# Patient Record
Sex: Male | Born: 1955
Health system: Southern US, Community
[De-identification: ages and names within clinical notes are randomized; demographics above are authoritative.]

## PROBLEM LIST (undated history)

## (undated) DIAGNOSIS — C4490 Unspecified malignant neoplasm of skin, unspecified: Secondary | ICD-10-CM

## (undated) DIAGNOSIS — G5601 Carpal tunnel syndrome, right upper limb: Secondary | ICD-10-CM

## (undated) DIAGNOSIS — R269 Unspecified abnormalities of gait and mobility: Secondary | ICD-10-CM

## (undated) DIAGNOSIS — G20 Parkinson's disease: Secondary | ICD-10-CM

## (undated) DIAGNOSIS — M7060 Trochanteric bursitis, unspecified hip: Secondary | ICD-10-CM

## (undated) DIAGNOSIS — M21371 Foot drop, right foot: Secondary | ICD-10-CM

## (undated) DIAGNOSIS — M7712 Lateral epicondylitis, left elbow: Secondary | ICD-10-CM

## (undated) DIAGNOSIS — G35 Multiple sclerosis: Secondary | ICD-10-CM

## (undated) DIAGNOSIS — G7102 Facioscapulohumeral muscular dystrophy: Secondary | ICD-10-CM

## (undated) DIAGNOSIS — IMO0002 Reserved for concepts with insufficient information to code with codable children: Secondary | ICD-10-CM

## (undated) DIAGNOSIS — K649 Unspecified hemorrhoids: Secondary | ICD-10-CM

## (undated) DIAGNOSIS — K219 Gastro-esophageal reflux disease without esophagitis: Secondary | ICD-10-CM

## (undated) DIAGNOSIS — M21372 Foot drop, left foot: Secondary | ICD-10-CM

## (undated) HISTORY — DX: Foot drop, left foot: M21.372

## (undated) HISTORY — DX: Carpal tunnel syndrome, right upper limb: G56.01

## (undated) HISTORY — DX: Trochanteric bursitis, unspecified hip: M70.60

## (undated) HISTORY — DX: Unspecified hemorrhoids: K64.9

## (undated) HISTORY — DX: Lateral epicondylitis, left elbow: M77.12

## (undated) HISTORY — DX: Foot drop, right foot: M21.371

## (undated) HISTORY — DX: Gastro-esophageal reflux disease without esophagitis: K21.9

## (undated) HISTORY — DX: Reserved for concepts with insufficient information to code with codable children: IMO0002

## (undated) HISTORY — DX: Parkinson's disease: G20

## (undated) HISTORY — PX: HEMORRHOIDECTOMY WITH HEMORRHOID BANDING: SHX5633

## (undated) HISTORY — DX: Unspecified malignant neoplasm of skin, unspecified: C44.90

## (undated) HISTORY — DX: Multiple sclerosis: G35

## (undated) HISTORY — DX: Unspecified abnormalities of gait and mobility: R26.9

## (undated) HISTORY — DX: Facioscapulohumeral muscular dystrophy: G71.02

---

## 1999-03-19 ENCOUNTER — Encounter: Admission: RE | Admit: 1999-03-19 | Discharge: 1999-06-17 | Payer: Self-pay | Admitting: Neurology

## 2002-08-06 ENCOUNTER — Emergency Department (HOSPITAL_COMMUNITY): Admission: EM | Admit: 2002-08-06 | Discharge: 2002-08-06 | Payer: Self-pay | Admitting: Emergency Medicine

## 2002-08-06 ENCOUNTER — Encounter: Payer: Self-pay | Admitting: Emergency Medicine

## 2002-09-03 ENCOUNTER — Ambulatory Visit (HOSPITAL_BASED_OUTPATIENT_CLINIC_OR_DEPARTMENT_OTHER): Admission: RE | Admit: 2002-09-03 | Discharge: 2002-09-03 | Payer: Self-pay | Admitting: Orthopedic Surgery

## 2005-08-14 ENCOUNTER — Ambulatory Visit (HOSPITAL_COMMUNITY): Admission: RE | Admit: 2005-08-14 | Discharge: 2005-08-14 | Payer: Self-pay | Admitting: Gastroenterology

## 2005-09-02 HISTORY — PX: CHOLECYSTECTOMY: SHX55

## 2005-11-28 ENCOUNTER — Encounter: Admission: RE | Admit: 2005-11-28 | Discharge: 2005-11-28 | Payer: Self-pay | Admitting: Internal Medicine

## 2006-01-17 ENCOUNTER — Ambulatory Visit (HOSPITAL_COMMUNITY): Admission: RE | Admit: 2006-01-17 | Discharge: 2006-01-18 | Payer: Self-pay | Admitting: Surgery

## 2006-01-17 ENCOUNTER — Encounter (INDEPENDENT_AMBULATORY_CARE_PROVIDER_SITE_OTHER): Payer: Self-pay | Admitting: *Deleted

## 2006-02-05 ENCOUNTER — Ambulatory Visit (HOSPITAL_COMMUNITY): Admission: RE | Admit: 2006-02-05 | Discharge: 2006-02-05 | Payer: Self-pay | Admitting: General Surgery

## 2007-07-02 ENCOUNTER — Encounter: Admission: RE | Admit: 2007-07-02 | Discharge: 2007-07-02 | Payer: Self-pay | Admitting: Internal Medicine

## 2011-01-18 NOTE — Op Note (Signed)
NAME:  Luis [name], Luis [name] NO.:  [id number]   MEDICAL RECORD NO.:  [id number]                   PATIENT TYPE:  AMB   LOCATION:  DSC                                  FACILITY:  [facility]   PHYSICIAN:  Thera Flake., M.D.             DATE OF BIRTH:  1956/07/10   DATE OF PROCEDURE:  09/03/2002  DATE OF DISCHARGE:                                 OPERATIVE REPORT   PREOPERATIVE DIAGNOSES:  1. Torn posterior cruciate ligament, right knee.  2. Torn right medial meniscus.   POSTOPERATIVE DIAGNOSES:  1. Torn anterior cruciate ligament, right knee.  2. Torn medial meniscus.  3. Plica.  4. Chondromalacia of the patellofemoral joint.   PROCEDURES:  1. Partial medial meniscectomy.  2. Debridement chondroplasty.  3. Excision, plica.  4. Debridement, anterior cruciate ligament.   SURGEON:  Dyke Brackett, M.D.   ANESTHESIA:  MAC.   INDICATIONS:  A 55 year old with a knee injury, MRI showing torn posterior  cruciate ligament, medial meniscus, thought to be amenable to outpatient  arthroscopy.   DESCRIPTION OF PROCEDURE:  Arthroscope at superomedial, inferolateral, and  inferomedial portal.  The thick, fibrotic plica was excised, a lot of  hemosiderin staining consistent with previous hemarthrosis.  Resection of  some of the fat pad that was fibrotic.  Medial compartment looked normal.  Lateral compartment looked normal in terms of articular cartilage.  The  lateral meniscus was normal.  The medial meniscus showed a complex tear of  the posterior horn of the medial meniscus requiring resection of about 30-  40% of the meniscus substance.  The patellofemoral joint showed mild  chondromalacia, which was debrided.   MRI, interestingly, had shown a torn posterior cruciate ligament.  It was  clear that on intra-articular examination, the anterior cruciate ligament  was torn off the femur.  The posterior cruciate by and large appeared intact  although it possibly  could have been injured as well, but there was  definitely an obvious ACL injury.  There was no impingement of the stump as  it had healed back to the PCL.  Debridement of this area was carried out  as well, patellofemoral joint separate from the medial compartment, medial  meniscectomy.  Knee drained free of fluid.  The  portals closed with nylon.  The knee infiltrated with Marcaine, morphine,  and an additional 40 mg of Depo-Medrol with additional 10 cc of Marcaine  with epinephrine for a total of 30 cc 0.5% Marcaine into the joint and into  the portals.  A lightly compressive sterile dressing applied.                                               Thera Flake., M.D.  WDC/MEDQ  D:  09/03/2002  T:  09/03/2002  Job:  [id number]

## 2011-01-18 NOTE — Op Note (Signed)
NAME:  Luis [name], Luis [name] NO.:  [id number]   MEDICAL RECORD NO.:  [id number]          PATIENT TYPE:  AMB   LOCATION:  ENDO                         FACILITY:  [facility]   PHYSICIAN:  Danise Edge, M.D.   DATE OF BIRTH:  [birth year]   DATE OF PROCEDURE:  08/14/2005  DATE OF DISCHARGE:                                 OPERATIVE REPORT   PROCEDURE:  Screening colonoscopy.   INDICATIONS FOR PROCEDURE:  Luis [name] is a 55 year old male here to  undergo his first screening colonoscopy with polypectomy to prevent colon  cancer.   ENDOSCOPIST:  Danise Edge, M.D.   PREMEDICATION:  Versed 5 mg, Demerol 50 mg.   PROCEDURE:  After obtaining informed consent, Luis [name] was placed in the  left lateral decubitus position.  I administered intravenous Demerol and  intravenous Versed to achieve conscious sedation for the procedure.  The  patient's blood pressure, oxygen saturation, and cardiac rhythm were  monitored throughout the procedure and documented in the medical record.   Anal inspection was normal.  Digital rectal exam reveals a nonnodular  prostate.  The Olympus adjustable pediatric colonoscope was introduced into  the rectum and advanced to the cecum.  A normal-appearing appendiceal  orifice and ileocecal valve were identified.  Colonic preparation for the  exam today was excellent.   RECTUM:  Normal.  Retroflexed view of the distal rectum normal.   SIGMOID COLON/DESCENDING COLON:  Normal.   SPLENIC FLEXURE:  Normal.   TRANSVERSE COLON:  Normal.   HEPATIC FLEXURE:  Normal.   ASCENDING COLON:  Normal.   CECUM AND ILEOCECAL VALVE:  Normal.   ASSESSMENT:  Normal screening proctocolonoscopy of the cecum.  No endoscopic  evidence for the presence of colorectal neoplasia.           ______________________________  Danise Edge, M.D.     MJ/MEDQ  D:  08/14/2005  T:  08/14/2005  Job:  [id number]   cc:   Candyce Churn, M.D.  Fax: [phone]

## 2011-01-18 NOTE — Op Note (Signed)
NAME:  Luis [name], Luis [name] NO.:  [id number]   MEDICAL RECORD NO.:  [id number]          PATIENT TYPE:  OIB   LOCATION:  5703                         FACILITY:  [facility]   PHYSICIAN:  Velora Heckler, MD      DATE OF BIRTH:  [birth year]   DATE OF PROCEDURE:  01/17/2006  DATE OF DISCHARGE:                                 OPERATIVE REPORT   PREOPERATIVE DIAGNOSIS:  Symptomatic cholelithiasis   POSTOPERATIVE DIAGNOSIS:  Symptomatic cholelithiasis   PROCEDURE:  Laparoscopic cholecystectomy with intraoperative  cholangiography.   SURGEON:  Velora Heckler, MD, FACS   ASSISTANT:  Gabrielle Dare. Janee Morn, M.D.   ANESTHESIA:  General per Germaine Pomfret, M.D.   ESTIMATED BLOOD LOSS:  Minimal.   PREPARATION:  Betadine.   COMPLICATIONS:  None.   INDICATIONS:  The patient is a 55 year old white male from [place], [place] who presents at the request of Dr. Johnella Moloney for right upper  quadrant abdominal pain and known cholelithiasis.  The patient had initial  episode of biliary colic in March 2007.  The patient was evaluated including  right upper quadrant ultrasound which demonstrated a thickened gallbladder  wall containing multiple gallstones.  The patient is now prepared and  brought to the operating room for cholecystectomy.   BODY OF REPORT.:  Procedure is done in OR #17 at the [facility].  The patient is brought to the operating room, placed in the supine position  on the operating room table.  Following administration of general  anesthesia, the patient is prepped and draped in usual strict aseptic  fashion.  After ascertaining that an adequate level of anesthesia had been  obtained, an infraumbilical incision is made with a #15 blade.  Dissection  was carried down to the fascia.  Fascia is incised in the midline.  The  peritoneal cavity is entered cautiously.  Then #0 Vicryl pursestring suture  is placed in the fascia.   A Hasson cannula was  introduced and secured with a pursestring suture.  The  abdomen was insufflated carbon dioxide.  Laparoscope was introduced and the  abdomen explored.  Operative ports were placed along the right costal margin  in the midline, midclavicular line, and anterior axillary line.  Fundus of  the gallbladder was grasped and retracted cephalad.  Dissection is begun at  the neck of the gallbladder.  Peritoneum is incised.  Cystic duct is  dissected out.  It appears quite small.  Clip was placed at the neck of the  gallbladder.  Cystic duct is incised.  Clear yellow bile emanates from the  cystic duct.   A Cook cholangiography catheter is introduced through a stab wound in the  right upper quadrant and inserted into the cystic duct.  It is secured with  a Ligaclip.  Using C-arm fluoroscopy, real time cholangiography is  performed.  There is rapid filling of a normal caliber common bile duct.  There is free flow distally into the duodenum.  There is reflux of contrast  proximally into the right-and-left hepatic ductal systems.  There  are no  filling defects.  There is no evidence of obstruction.  Clip is withdrawn  and Cook catheter is removed from the peritoneal cavity.  Cystic duct is  triply clipped and divided.  Cystic artery is dissected out, doubly clipped,  and divided.   Gallbladder is then excised the gallbladder bed using the hook  electrocautery for hemostasis.  Gallbladder is completely extracted and  placed into an EndoCatch bag.  It is withdrawn through the umbilical port  without difficulty.  Right upper quadrant is irrigated copiously with warm  saline which was evacuated.  Good hemostasis is noted.  Ports are removed  under direct vision; and good hemostasis is noted at all port sites.  Pneumoperitoneum is released.  Port sites were anesthetized with local  anesthetic.  Then #0 Vicryl pursestring suture is tied securely at the  umbilicus.  Wounds were closed with interrupted 4-0  Vicryl subcuticular  sutures.  Wounds were washed and dried; and Benzoin and Steri-Strips are  applied.  Sterile dressings are applied.  The patient is awakened from  anesthesia and brought to the recovery room in stable condition.  The  patient tolerated the procedure well.      Velora Heckler, MD  Electronically Signed     TMG/MEDQ  D:  01/17/2006  T:  01/17/2006  Job:  [id number]   cc:   Candyce Churn, M.D.  Fax: [phone]

## 2012-11-09 ENCOUNTER — Other Ambulatory Visit: Payer: Self-pay | Admitting: Internal Medicine

## 2012-11-09 DIAGNOSIS — M545 Low back pain, unspecified: Secondary | ICD-10-CM

## 2012-11-11 ENCOUNTER — Other Ambulatory Visit: Payer: Self-pay | Admitting: Internal Medicine

## 2012-11-11 DIAGNOSIS — Z139 Encounter for screening, unspecified: Secondary | ICD-10-CM

## 2012-11-12 ENCOUNTER — Ambulatory Visit
Admission: RE | Admit: 2012-11-12 | Discharge: 2012-11-12 | Disposition: A | Discharge status: Home / Self Care | Payer: BC Managed Care – PPO | Source: Ambulatory Visit | Attending: Internal Medicine | Admitting: Internal Medicine

## 2012-11-12 DIAGNOSIS — Z139 Encounter for screening, unspecified: Secondary | ICD-10-CM

## 2012-11-15 ENCOUNTER — Ambulatory Visit
Admission: RE | Admit: 2012-11-15 | Discharge: 2012-11-15 | Disposition: A | Discharge status: Home / Self Care | Payer: BC Managed Care – PPO | Source: Ambulatory Visit | Attending: Internal Medicine | Admitting: Internal Medicine

## 2012-11-15 DIAGNOSIS — M545 Low back pain, unspecified: Secondary | ICD-10-CM

## 2013-06-16 ENCOUNTER — Other Ambulatory Visit: Payer: Self-pay | Admitting: Gastroenterology

## 2014-03-09 ENCOUNTER — Other Ambulatory Visit: Payer: Self-pay | Admitting: Internal Medicine

## 2014-03-09 ENCOUNTER — Ambulatory Visit
Admission: RE | Admit: 2014-03-09 | Discharge: 2014-03-09 | Disposition: A | Discharge status: Home / Self Care | Payer: No Typology Code available for payment source | Source: Ambulatory Visit | Attending: Internal Medicine | Admitting: Internal Medicine

## 2014-03-09 DIAGNOSIS — R05 Cough: Secondary | ICD-10-CM

## 2014-03-09 DIAGNOSIS — R059 Cough, unspecified: Secondary | ICD-10-CM

## 2014-03-16 ENCOUNTER — Encounter: Payer: Self-pay | Admitting: Internal Medicine

## 2014-03-16 ENCOUNTER — Ambulatory Visit (INDEPENDENT_AMBULATORY_CARE_PROVIDER_SITE_OTHER): Payer: No Typology Code available for payment source | Admitting: Internal Medicine

## 2014-03-16 VITALS — BP 120/88 | HR 66 | Temp 98.3°F | Ht 71.0 in | Wt 210.0 lb

## 2014-03-16 DIAGNOSIS — R05 Cough: Secondary | ICD-10-CM

## 2014-03-16 DIAGNOSIS — R058 Other specified cough: Secondary | ICD-10-CM

## 2014-03-16 DIAGNOSIS — R059 Cough, unspecified: Secondary | ICD-10-CM

## 2014-03-16 DIAGNOSIS — R9389 Abnormal findings on diagnostic imaging of other specified body structures: Secondary | ICD-10-CM

## 2014-03-16 DIAGNOSIS — J986 Disorders of diaphragm: Secondary | ICD-10-CM

## 2014-03-16 MED ORDER — PANTOPRAZOLE SODIUM 40 MG PO TBEC: 40.0000 mg | DELAYED_RELEASE_TABLET | Freq: Every day | ORAL | Status: DC

## 2014-03-16 MED ORDER — FAMOTIDINE 20 MG PO TABS: ORAL_TABLET | ORAL | Status: DC

## 2014-03-16 NOTE — Patient Instructions (Addendum)
Pantoprazole (protonix) 40 mg   Take 30-60 min before first meal of the day and Pepcid 20 mg one bedtime until return to office - this is the best way to tell whether stomach acid is contributing to your problem.    GERD (REFLUX)  is an extremely common cause of respiratory symptoms, many times with no significant heartburn at all.    It can be treated with medication, but also with lifestyle changes including avoidance of late meals, excessive alcohol, smoking cessation, and avoid fatty foods, chocolate, peppermint, colas, red wine, and acidic juices such as orange juice.  NO MINT OR MENTHOL PRODUCTS SO NO COUGH DROPS  USE SUGARLESS CANDY INSTEAD (jolley ranchers or Stover's)  NO OIL BASED VITAMINS - use powdered substitutes.    Please see patient coordinator before you leave today  to schedule sniff test - I will call you with results

## 2014-03-16 NOTE — Progress Notes (Signed)
   Subjective:    Patient ID: Luis [name], male    DOB: [birth year] MRN: [id number]  HPI  62 yowm never smoker never resp problems until   in 1990s with freq resp problems " always starting in sinuses" freq complicated by bad coughing lots of mucinex and allegra alkaseltzer and pred paks and narcotic cough syrups then changed pattern as of  June 27th 2015 acutely  felt sore throat (new) but assos with  sinus congestion green drainage rx phoned in augmentin and mucinex no better moved into chest then cough med and pred and referred to pulmonary 03/16/14 by Dr Jodelle Green with abn cxr = L HD elevated    03/16/2014 1st [facility] office visit/ Wilkins Elpers  Chief Complaint  Patient presents with  . Pulmonary Consult    Referred per Dr. [name] for eval of abnormal cxr and cough. Pt c/o cough for the past 3 wks- prod with moderate clear sputum.    still has intermittent watery drainage but no correlation to dry cough day > night (robitussin)  No obvious other patterns in day to day or daytime variabilty or assoc sob or cp or chest tightness, subjective wheeze overt   hb symptoms. No unusual exp hx or h/o childhood pna/ asthma or knowledge of premature birth.  Sleeping ok without nocturnal  or early am exacerbation  of respiratory  c/o's or need for noct saba. Also denies any obvious fluctuation of symptoms with weather or environmental changes or other aggravating or alleviating factors except as outlined above   Current Medications, Allergies, Complete Past Medical History, Past Surgical History, Family History, and Social History were reviewed in [employer] record.              Review of Systems  Constitutional: Negative for fever, chills, activity change, appetite change and unexpected weight change.  HENT: Positive for congestion and sore throat. Negative for dental problem, postnasal drip, rhinorrhea, sneezing, trouble swallowing and voice change.   Eyes:  Negative for visual disturbance.  Respiratory: Positive for cough. Negative for choking and shortness of breath.   Cardiovascular: Negative for chest pain and leg swelling.  Gastrointestinal: Negative for nausea, vomiting and abdominal pain.  Genitourinary: Negative for difficulty urinating.  Musculoskeletal: Positive for arthralgias.  Skin: Negative for rash.  Psychiatric/Behavioral: Negative for behavioral problems and confusion.       Objective:   Physical Exam   Wt Readings from Last 3 Encounters:  03/16/14 210 lb (95.255 kg)      HEENT: nl dentition, turbinates, and orophanx. Nl external ear canals without cough reflex   NECK :  without JVD/Nodes/TM/ nl carotid upstrokes bilaterally   LUNGS: no acc muscle use, clear to A and P bilaterally without cough on insp or exp maneuvers   CV:  RRR  no s3 or murmur or increase in P2, no edema   ABD:  soft and nontender with nl excursion in the supine position. No bruits or organomegaly, bowel sounds nl  MS:  warm without deformities, calf tenderness, cyanosis or clubbing  SKIN: warm and dry without lesions    NEURO:  alert, approp, no deficits    cxr 03/09/14  Cardiac silhouette is normal in size. Normal mediastinal and hilar  contours. Elevated left hemidiaphragm, which is more elevated than  on the prior study. Huge stomach bubbles       Assessment & Plan:

## 2014-03-17 ENCOUNTER — Ambulatory Visit (HOSPITAL_COMMUNITY)
Admission: RE | Admit: 2014-03-17 | Discharge: 2014-03-17 | Disposition: A | Discharge status: Home / Self Care | Payer: No Typology Code available for payment source | Source: Ambulatory Visit | Attending: Internal Medicine | Admitting: Internal Medicine

## 2014-03-17 ENCOUNTER — Other Ambulatory Visit: Payer: Self-pay | Admitting: Internal Medicine

## 2014-03-17 ENCOUNTER — Encounter: Payer: Self-pay | Admitting: Internal Medicine

## 2014-03-17 DIAGNOSIS — J986 Disorders of diaphragm: Secondary | ICD-10-CM

## 2014-03-17 DIAGNOSIS — G7109 Other specified muscular dystrophies: Secondary | ICD-10-CM | POA: Insufficient documentation

## 2014-03-17 DIAGNOSIS — R9389 Abnormal findings on diagnostic imaging of other specified body structures: Secondary | ICD-10-CM

## 2014-03-17 NOTE — Progress Notes (Signed)
Quick Note:  Spoke with pt and notified of results per Dr. Melvyn Novas. Pt verbalized understanding and denied any questions. Order was sent to [facility] and confirmed with Pamala Hurry at [facility] will be done (NIF added to order that was sent to [facility])   ______

## 2014-03-18 ENCOUNTER — Ambulatory Visit (HOSPITAL_COMMUNITY): Payer: No Typology Code available for payment source

## 2014-03-18 DIAGNOSIS — R05 Cough: Secondary | ICD-10-CM | POA: Insufficient documentation

## 2014-03-18 DIAGNOSIS — R058 Other specified cough: Secondary | ICD-10-CM | POA: Insufficient documentation

## 2014-03-18 NOTE — Assessment & Plan Note (Addendum)
?   Idiopathic (favor this )  related to unusual MS pattern but not amenable to any medical and very limited surgical options;  However, doubt it's the cause for his acute problem (cough) so can follow conservatively for now

## 2014-03-18 NOTE — Assessment & Plan Note (Signed)
Nl except for L HD  - see above

## 2014-03-18 NOTE — Assessment & Plan Note (Signed)

## 2014-03-23 ENCOUNTER — Encounter: Payer: Self-pay | Admitting: Internal Medicine

## 2014-03-28 ENCOUNTER — Encounter (HOSPITAL_COMMUNITY): Payer: No Typology Code available for payment source

## 2014-03-30 ENCOUNTER — Encounter: Payer: Self-pay | Admitting: Internal Medicine

## 2014-03-30 ENCOUNTER — Ambulatory Visit (INDEPENDENT_AMBULATORY_CARE_PROVIDER_SITE_OTHER): Payer: No Typology Code available for payment source | Admitting: Internal Medicine

## 2014-03-30 VITALS — BP 124/90 | HR 62 | Temp 97.8°F | Ht 71.0 in | Wt 201.0 lb

## 2014-03-30 DIAGNOSIS — R05 Cough: Secondary | ICD-10-CM

## 2014-03-30 DIAGNOSIS — R058 Other specified cough: Secondary | ICD-10-CM

## 2014-03-30 DIAGNOSIS — R059 Cough, unspecified: Secondary | ICD-10-CM

## 2014-03-30 DIAGNOSIS — J986 Disorders of diaphragm: Secondary | ICD-10-CM

## 2014-03-30 NOTE — Patient Instructions (Addendum)
Allegra / zyrtec/ chlortrimeton (the order is more sedating but more effective for a drippy nose)  Please see patient coordinator before you leave today  to reschedule  pfts with NIF      Treatment of pain under your diaphragm consists of avoiding foods that cause gas (especially boiled eggs/ beans and raw vegetables like spinach and salads)  and citrucel 1 heaping tsp twice daily with a large glass of water.  Pain should improve w/in 2 weeks and if not then consider further GI work up.

## 2014-03-30 NOTE — Progress Notes (Signed)
Subjective:    Patient ID: Luis [name], male    DOB: [birth year] MRN: [id number]    Brief patient profile:  60 yowm with MD never smoker never resp problems until   in 1990s with freq resp problems " always starting in sinuses" freq complicated by bad coughing lots of mucinex and allegra alkaseltzer and pred paks and narcotic cough syrups then changed pattern as of  June 27th 2015 acutely  felt sore throat (new) but assos with  sinus congestion green drainage rx phoned in augmentin and mucinex no better moved into chest then cough med and pred and referred to pulmonary 03/16/14 by Dr Jodelle Green with abn cxr = L HD elevated referred to pulmonary clinic 03/16/14    History of Present Illness  03/16/2014 1st [facility] office visit/ Nohea Kras  Chief Complaint  Patient presents with  . Pulmonary Consult    Referred per Dr. [name] for eval of abnormal cxr and cough. Pt c/o cough for the past 3 wks- prod with moderate clear sputum.    still has intermittent watery drainage but no correlation to dry cough day > night (robitussin) rec Pantoprazole (protonix) 40 mg   Take 30-60 min before first meal of the day and Pepcid 20 mg one bedtime until return to office - this is the best way to tell whether stomach acid is contributing to your problem.   GERD diet   03/30/2014 f/u ov/Ryley Bachtel re: Paralyzed L HD  Chief Complaint  Patient presents with  . Follow-up    Pt reports his cough is unchanged since last visit. No new co's today.    Not limited by breathing from desired activities and still working as Naval architect for [employer]. Doe occ have LUQ discomfort tranisient anteriorly always relieve supine, more likely in sitting position   No obvious day to day or daytime variabilty or assoc excess or purulent mucus or   chest tightness, subjective wheeze overt sinus or hb symptoms. No unusual exp hx or h/o childhood pna/ asthma or knowledge of premature birth.  Sleeping ok without nocturnal   or early am exacerbation  of respiratory  c/o's or need for noct saba. Also denies any obvious fluctuation of symptoms with weather or environmental changes or other aggravating or alleviating factors except as outlined above   Current Medications, Allergies, Complete Past Medical History, Past Surgical History, Family History, and Social History were reviewed in [employer] record.  ROS  The following are not active complaints unless bolded sore throat, dysphagia, dental problems, itching, sneezing,  nasal congestion or excess/ purulent secretions, ear ache,   fever, chills, sweats, unintended wt loss, pleuritic or exertional cp, hemoptysis,  orthopnea pnd or leg swelling, presyncope, palpitations, heartburn, abdominal pain, anorexia, nausea, vomiting, diarrhea  or change in bowel or urinary habits, change in stools or urine, dysuria,hematuria,  rash, arthralgias, visual complaints, headache, numbness weakness or ataxia or problems with walking or coordination,  change in mood/affect or memory.                         Objective:   Physical Exam  03/30/2014       201  Wt Readings from Last 3 Encounters:  03/16/14 210 lb (95.255 kg)      HEENT: nl dentition, turbinates, and orophanx. Nl external ear canals without cough reflex   NECK :  without JVD/Nodes/TM/ nl carotid upstrokes bilaterally    LUNGS: no acc muscle  use, decreased bs L base   CV:  RRR  no s3 or murmur or increase in P2, no edema   ABD:  soft and nontender with nl excursion in the supine position. No bruits or organomegaly, bowel sounds nl  MS:  warm without deformities, calf tenderness, cyanosis or clubbing  SKIN: warm and dry without lesions    NEURO:  alert, approp, no deficits    cxr 03/09/14  Cardiac silhouette is normal in size. Normal mediastinal and hilar  contours. Elevated left hemidiaphragm, which is more elevated than  on the prior study. Huge stomach bubbles         Assessment & Plan:

## 2014-03-31 ENCOUNTER — Encounter: Payer: Self-pay | Admitting: Internal Medicine

## 2014-03-31 NOTE — Assessment & Plan Note (Signed)
No response to acid suppression ok to try off and just use 1st gen H1 if tolerates as per guidelines.

## 2014-03-31 NOTE — Assessment & Plan Note (Addendum)
Baseline cxr nl 12/18/06  - Poss SNIFF for paradox on L 03/17/2014   The unilaterality and lack of sign limiting sob strongly suggests this is a chronic condition and likely not related to MD - common to see this as idiopathic event like a Bell's palsy and not severe enough to warrant any kind of correction or change in rx except control wt, ex/ IS   He does have occ pain in upright position suggesting air getting trapped in splenic flexure. Paint is  Stereotypical, recurrent with a very limited distribution of pain locations, daytime, not exacerbated by ex or coughing, worse in sitting position, associated with generalized abd bloating, not present supine due to the dome effect of the diaphragm is  canceled in that position. Frequently these patients have had multiple negative GI workups and CT scans.  Treatment consists of avoiding foods that cause gas (especially beans and raw vegetables like spinach and salads)  and citrucel 1 heaping tsp twice daily with a large glass of water.  Pain should improve w/in 2 weeks and if not then consider further GI work up.

## 2014-04-04 ENCOUNTER — Ambulatory Visit (HOSPITAL_COMMUNITY)
Admission: RE | Admit: 2014-04-04 | Discharge: 2014-04-04 | Disposition: A | Discharge status: Home / Self Care | Payer: No Typology Code available for payment source | Source: Ambulatory Visit | Attending: Internal Medicine | Admitting: Internal Medicine

## 2014-04-04 DIAGNOSIS — J986 Disorders of diaphragm: Secondary | ICD-10-CM | POA: Insufficient documentation

## 2014-04-04 MED ORDER — ALBUTEROL SULFATE (2.5 MG/3ML) 0.083% IN NEBU
2.5000 mg | INHALATION_SOLUTION | Freq: Once | RESPIRATORY_TRACT | Status: AC
  Administered 2014-04-04: 2.5 mg via RESPIRATORY_TRACT

## 2014-04-07 ENCOUNTER — Encounter: Payer: Self-pay | Admitting: Internal Medicine

## 2014-04-07 LAB — PULMONARY FUNCTION TEST
DL/VA % pred: 122 %
DL/VA: 5.73 ml/min/mmHg/L
DLCO unc % pred: 83 %
DLCO unc: 28.05 ml/min/mmHg
FEF 25-75 Post: 3.07 L/sec
FEF 25-75 Pre: 2.85 L/sec
FEF2575-%Change-Post: 7 %
FEF2575-%Pred-Post: 98 %
FEF2575-%Pred-Pre: 91 %
FEV1-%Change-Post: 1 %
FEV1-%Pred-Post: 73 %
FEV1-%Pred-Pre: 72 %
FEV1-Post: 2.77 L
FEV1-Pre: 2.74 L
FEV1FVC-%Change-Post: 3 %
FEV1FVC-%Pred-Pre: 107 %
FEV6-%Change-Post: -2 %
FEV6-%Pred-Post: 69 %
FEV6-%Pred-Pre: 70 %
FEV6-Post: 3.29 L
FEV6-Pre: 3.36 L
FEV6FVC-%Pred-Post: 104 %
FEV6FVC-%Pred-Pre: 104 %
FVC-%Change-Post: -2 %
FVC-%Pred-Post: 65 %
FVC-%Pred-Pre: 67 %
FVC-Post: 3.29 L
FVC-Pre: 3.36 L
Post FEV1/FVC ratio: 84 %
Post FEV6/FVC ratio: 100 %
Pre FEV1/FVC ratio: 82 %
Pre FEV6/FVC Ratio: 100 %
RV % pred: 317 %
RV: 7.2 L
TLC % pred: 144 %
TLC: 10.39 L

## 2014-04-11 ENCOUNTER — Encounter (HOSPITAL_COMMUNITY): Payer: No Typology Code available for payment source

## 2014-06-02 ENCOUNTER — Telehealth: Payer: Self-pay | Admitting: Internal Medicine

## 2014-06-02 NOTE — Telephone Encounter (Signed)
Called and spoke to pt's wife. Pt questioning if he can continue taking protonix. Pt stated it has helped.   MW please advise if you want to continue Protonix.     OV on 03/16/14: Pantoprazole (protonix) 40 mg Take 30-60 min before first meal of the day and Pepcid 20 mg one bedtime until return to office   OV from 03/30/14:  Patient Instructions     Allegra / zyrtec/ chlortrimeton (the order is more sedating but more effective for a drippy nose)  Please see patient coordinator before you leave today to reschedule pfts with NIF  Treatment of pain under your diaphragm consists of avoiding foods that cause gas (especially boiled eggs/ beans and raw vegetables like spinach and salads) and citrucel 1 heaping tsp twice daily with a large glass of water. Pain should improve w/in 2 weeks and if not then consider further GI work up.

## 2014-06-02 NOTE — Telephone Encounter (Signed)
LMTCB x 1 

## 2014-06-02 NOTE — Telephone Encounter (Signed)
Yes ok for 3 m then will need to discuss longterm rx with his primary or see GI as I don't usually rec it be continued indefinitely s monitoring need

## 2014-06-03 MED ORDER — PANTOPRAZOLE SODIUM 40 MG PO TBEC: 40.0000 mg | DELAYED_RELEASE_TABLET | Freq: Every day | ORAL | Status: DC

## 2014-06-03 NOTE — Telephone Encounter (Signed)
Called and spoke to pt's wife. Informed pt of recs per MW. Rx sent to preferred pharm with hold till 10/11 per pt's wife. Nothing further needed.

## 2014-06-30 ENCOUNTER — Ambulatory Visit
Admission: RE | Admit: 2014-06-30 | Discharge: 2014-06-30 | Disposition: A | Discharge status: Home / Self Care | Payer: No Typology Code available for payment source | Source: Ambulatory Visit | Attending: Internal Medicine | Admitting: Internal Medicine

## 2014-06-30 ENCOUNTER — Other Ambulatory Visit: Payer: Self-pay | Admitting: Internal Medicine

## 2014-06-30 DIAGNOSIS — J986 Disorders of diaphragm: Secondary | ICD-10-CM

## 2015-06-19 ENCOUNTER — Other Ambulatory Visit: Payer: Self-pay | Admitting: Internal Medicine

## 2015-06-19 ENCOUNTER — Ambulatory Visit
Admission: RE | Admit: 2015-06-19 | Discharge: 2015-06-19 | Disposition: A | Discharge status: Home / Self Care | Payer: No Typology Code available for payment source | Source: Ambulatory Visit | Attending: Internal Medicine | Admitting: Internal Medicine

## 2015-06-19 DIAGNOSIS — R0602 Shortness of breath: Secondary | ICD-10-CM

## 2016-04-02 ENCOUNTER — Ambulatory Visit
Admission: RE | Admit: 2016-04-02 | Discharge: 2016-04-02 | Disposition: A | Discharge status: Home / Self Care | Payer: BLUE CROSS/BLUE SHIELD | Source: Ambulatory Visit | Attending: Internal Medicine | Admitting: Internal Medicine

## 2016-04-02 ENCOUNTER — Other Ambulatory Visit: Payer: Self-pay | Admitting: Internal Medicine

## 2016-04-02 DIAGNOSIS — R0789 Other chest pain: Secondary | ICD-10-CM

## 2016-07-31 ENCOUNTER — Ambulatory Visit
Admission: RE | Admit: 2016-07-31 | Discharge: 2016-07-31 | Disposition: A | Discharge status: Home / Self Care | Payer: BLUE CROSS/BLUE SHIELD | Source: Ambulatory Visit | Attending: Internal Medicine | Admitting: Internal Medicine

## 2016-07-31 ENCOUNTER — Other Ambulatory Visit: Payer: Self-pay | Admitting: Internal Medicine

## 2016-07-31 DIAGNOSIS — R1031 Right lower quadrant pain: Secondary | ICD-10-CM

## 2016-12-16 ENCOUNTER — Other Ambulatory Visit: Payer: Self-pay | Admitting: Dermatology

## 2017-02-18 ENCOUNTER — Other Ambulatory Visit (HOSPITAL_COMMUNITY): Payer: Self-pay | Admitting: Internal Medicine

## 2017-02-18 ENCOUNTER — Ambulatory Visit
Admission: RE | Admit: 2017-02-18 | Discharge: 2017-02-18 | Disposition: A | Discharge status: Home / Self Care | Payer: BLUE CROSS/BLUE SHIELD | Source: Ambulatory Visit | Attending: Internal Medicine | Admitting: Internal Medicine

## 2017-02-18 ENCOUNTER — Other Ambulatory Visit: Payer: Self-pay | Admitting: Internal Medicine

## 2017-02-18 DIAGNOSIS — G7100 Muscular dystrophy, unspecified: Secondary | ICD-10-CM

## 2017-02-18 DIAGNOSIS — J986 Disorders of diaphragm: Secondary | ICD-10-CM

## 2017-02-24 ENCOUNTER — Ambulatory Visit (HOSPITAL_COMMUNITY)
Admission: RE | Admit: 2017-02-24 | Discharge: 2017-02-24 | Disposition: A | Discharge status: Home / Self Care | Payer: BLUE CROSS/BLUE SHIELD | Source: Ambulatory Visit | Attending: Internal Medicine | Admitting: Internal Medicine

## 2017-02-24 DIAGNOSIS — G7100 Muscular dystrophy, unspecified: Secondary | ICD-10-CM

## 2017-02-24 DIAGNOSIS — I360 Nonrheumatic tricuspid (valve) stenosis: Secondary | ICD-10-CM | POA: Diagnosis not present

## 2017-02-26 ENCOUNTER — Telehealth (HOSPITAL_COMMUNITY): Payer: Self-pay | Admitting: Internal Medicine

## 2017-02-26 ENCOUNTER — Other Ambulatory Visit: Payer: Self-pay | Admitting: Internal Medicine

## 2017-02-26 ENCOUNTER — Telehealth: Payer: Self-pay

## 2017-02-26 DIAGNOSIS — R0789 Other chest pain: Secondary | ICD-10-CM

## 2017-02-26 DIAGNOSIS — R5383 Other fatigue: Secondary | ICD-10-CM

## 2017-02-26 NOTE — Telephone Encounter (Signed)
02/26/2017 09:56 AM Phone (Outgoing) [name], Luis Mavis (Self) [phone] (H)   Completed - Called pt and spoke with him to get scheduled for [facility] and he voiced that he would call right back he had to check on some dates.     By Verdene Rio

## 2017-02-26 NOTE — Telephone Encounter (Signed)
SENT NOTES TO SCHEDULING 

## 2017-03-03 ENCOUNTER — Other Ambulatory Visit (HOSPITAL_COMMUNITY): Payer: BLUE CROSS/BLUE SHIELD

## 2017-03-10 ENCOUNTER — Ambulatory Visit (HOSPITAL_COMMUNITY): Payer: BLUE CROSS/BLUE SHIELD

## 2017-03-25 ENCOUNTER — Encounter (HOSPITAL_COMMUNITY): Payer: BLUE CROSS/BLUE SHIELD

## 2017-04-02 ENCOUNTER — Telehealth (HOSPITAL_COMMUNITY): Payer: Self-pay | Admitting: *Deleted

## 2017-04-02 NOTE — Telephone Encounter (Signed)
Patient's wife per patients verbal permission, given detailed instructions per Myocardial Perfusion Study Information Sheet for the test on 04/07/17. Patient notified to arrive 15 minutes early and that it is imperative to arrive on time for appointment to keep from having the test rescheduled.  If you need to cancel or reschedule your appointment, please call the office within 24 hours of your appointment. . Patient verbalized understanding. Kirstie Peri

## 2017-04-02 NOTE — Telephone Encounter (Signed)
Patient' given detailed instructions per Myocardial Perfusion Study Information Sheet for the test on 04/07/17. Patient notified to arrive 15 minutes early and that it is imperative to arrive on time for appointment to keep from having the test rescheduled.  If you need to cancel or reschedule your appointment, please call the office within 24 hours of your appointment. . Patient verbalized understanding. Luis [name]

## 2017-04-07 ENCOUNTER — Encounter (INDEPENDENT_AMBULATORY_CARE_PROVIDER_SITE_OTHER): Payer: Self-pay

## 2017-04-07 ENCOUNTER — Ambulatory Visit (HOSPITAL_COMMUNITY): Payer: BLUE CROSS/BLUE SHIELD | Attending: Internal Medicine

## 2017-04-07 DIAGNOSIS — Z8249 Family history of ischemic heart disease and other diseases of the circulatory system: Secondary | ICD-10-CM | POA: Insufficient documentation

## 2017-04-07 DIAGNOSIS — R0789 Other chest pain: Secondary | ICD-10-CM

## 2017-04-07 DIAGNOSIS — I2510 Atherosclerotic heart disease of native coronary artery without angina pectoris: Secondary | ICD-10-CM | POA: Insufficient documentation

## 2017-04-07 DIAGNOSIS — R0609 Other forms of dyspnea: Secondary | ICD-10-CM | POA: Diagnosis present

## 2017-04-07 DIAGNOSIS — R5383 Other fatigue: Secondary | ICD-10-CM | POA: Diagnosis not present

## 2017-04-07 DIAGNOSIS — R9439 Abnormal result of other cardiovascular function study: Secondary | ICD-10-CM | POA: Insufficient documentation

## 2017-04-07 LAB — MYOCARDIAL PERFUSION IMAGING
LV dias vol: 105 mL (ref 62–150)
LV sys vol: 62 mL
Peak HR: 78 {beats}/min
RATE: 0.37
Rest HR: 49 {beats}/min
SDS: 1
SRS: 4
SSS: 5
TID: 1.16

## 2017-04-07 MED ORDER — TECHNETIUM TC 99M TETROFOSMIN IV KIT
31.9000 | PACK | Freq: Once | INTRAVENOUS | Status: AC | PRN
  Administered 2017-04-07: 31.9 via INTRAVENOUS
  Filled 2017-04-07: qty 32

## 2017-04-07 MED ORDER — REGADENOSON 0.4 MG/5ML IV SOLN
0.4000 mg | Freq: Once | INTRAVENOUS | Status: AC
  Administered 2017-04-07: 0.4 mg via INTRAVENOUS

## 2017-04-07 MED ORDER — TECHNETIUM TC 99M TETROFOSMIN IV KIT
11.0000 | PACK | Freq: Once | INTRAVENOUS | Status: AC | PRN
  Administered 2017-04-07: 11 via INTRAVENOUS
  Filled 2017-04-07: qty 11

## 2017-05-02 ENCOUNTER — Encounter (INDEPENDENT_AMBULATORY_CARE_PROVIDER_SITE_OTHER): Payer: Self-pay

## 2017-05-02 ENCOUNTER — Encounter: Payer: Self-pay | Admitting: Interventional Cardiology

## 2017-05-02 ENCOUNTER — Ambulatory Visit (INDEPENDENT_AMBULATORY_CARE_PROVIDER_SITE_OTHER): Payer: BLUE CROSS/BLUE SHIELD | Admitting: Interventional Cardiology

## 2017-05-02 VITALS — BP 126/84 | HR 68 | Ht 71.0 in | Wt 192.2 lb

## 2017-05-02 DIAGNOSIS — R9439 Abnormal result of other cardiovascular function study: Secondary | ICD-10-CM

## 2017-05-02 DIAGNOSIS — G7102 Facioscapulohumeral muscular dystrophy: Secondary | ICD-10-CM

## 2017-05-02 DIAGNOSIS — I420 Dilated cardiomyopathy: Secondary | ICD-10-CM | POA: Diagnosis not present

## 2017-05-02 DIAGNOSIS — G710 Muscular dystrophy: Secondary | ICD-10-CM

## 2017-05-02 NOTE — Patient Instructions (Signed)
Medication Instructions:  None  Labwork: None  Testing/Procedures: None  Follow-Up: Your physician recommends that you schedule a follow-up appointment as needed with Dr. Smith.    Any Other Special Instructions Will Be Listed Below (If Applicable).     If you need a refill on your cardiac medications before your next appointment, please call your pharmacy.   

## 2017-05-02 NOTE — Progress Notes (Signed)
Cardiology Office Note    Date:  05/02/2017   ID:  Luis [name], DOB [birth year], MRN [id number]  PCP:  [name], MD  Cardiologist: Sinclair Grooms, MD   Chief Complaint  Patient presents with  . Muscular Dystrophy     Facioscapulohumeral    History of Present Illness:  Luis [name] is a 61 y.o. male referred for cardiac consultation by Dr. Mertha Finders because of concern about the possibility of cardiomyopathy.  The patient is here because of concern about the possibility of cardiomyopathy associated with his variety of muscular dystrophy. He has Facioscapulohumeral. For some reason, there was concern earlier this year about the possibility of cardiomyopathy. An echocardiogram was done and results are noted below. He had normal LV size, function, and thickness. Because of vague complaints of exertional fatigue, dyspnea, and atypical chest pain a stress Myoview study was done with pharmacologic stress. This study was noted to be intermediate risk because of a small non-redistributing apical defect and an EF of 41%.  His peak with the patient he does have exertional fatigue and dyspnea. This is been slowly progressive over several years. I also am made aware that he has left hemidiaphragm paralysis second rib is to the dyspnea. Chest discomfort has been fleeting and is nonexertional. He does not smoke. There is no known history of premature atherosclerosis. He has never before had a heart attack. No pre-existing history of heart disease. He sleeps on his right side because of the paralyzed left diaphragm.  Past Medical History:  Diagnosis Date  . Bursitis, trochanteric    Episodic  . Carpal tunnel syndrome on right   . Degenerative disk disease    l5-S1  . GERD (gastroesophageal reflux disease)   . Hemorrhoids    with anal fissures  . Left lateral epicondylitis   . MS (multiple sclerosis) ([place])     Past Surgical History:  Procedure Laterality Date  . CHOLECYSTECTOMY   2007  . HEMORRHOIDECTOMY WITH HEMORRHOID BANDING      Current Medications: Outpatient Medications Prior to Visit  Medication Sig Dispense Refill  . Ascorbic Acid (VITAMIN C) 250 MG CHEW Chew 1 tablet by mouth as directed.    Marland Kitchen HYDROcodone-acetaminophen (NORCO) 10-325 MG per tablet Take 1 tablet by mouth every 6 (six) hours as needed.    . zolpidem (AMBIEN) 10 MG tablet Take 10 mg by mouth at bedtime as needed for sleep.    . pantoprazole (PROTONIX) 40 MG tablet Take 1 tablet (40 mg total) by mouth daily. (Patient not taking: Reported on 05/02/2017) 30 tablet 2  . amitriptyline (ELAVIL) 50 MG tablet Take 50 mg by mouth at bedtime.    . lidocaine (LIDODERM) 5 % Place 1 patch onto the skin daily. As needed    . meloxicam (MOBIC) 15 MG tablet Take 15 mg by mouth daily.    . Multiple Vitamins-Minerals (CENTRUM ADULTS PO) Take 1 tablet by mouth as directed.     No facility-administered medications prior to visit.      Allergies:   Patient has no known allergies.   Social History   Social History  . Marital status: Married    Spouse name: N/A  . Number of children: 0  . Years of education: N/A   Occupational History  . Radio broadcast assistant [employer]    Social History Main Topics  . Smoking status: Never Smoker  . Smokeless tobacco: Never Used  . Alcohol use No  . Drug  use: No  . Sexual activity: Not Asked   Other Topics Concern  . None   Social History Narrative  . None     Family History:  The patient's family history includes Allergies in his brother and sister; Bone cancer in his brother; Brain cancer in his mother; Heart disease in his father.   ROS:   Please see the history of present illness.    Anxiety concerning the possibility of having a heart problem. Upper extremity weakness. Difficulty with ambulating. Gastroesophageal reflux type symptoms. Allergic rhinitis. He has difficulty sleeping.  All other systems reviewed and are negative.   PHYSICAL EXAM:     VS:  BP 126/84 (BP Location: Left Arm)   Pulse 68   Ht 5\' 11"  (1.803 m)   Wt 192 lb 3.2 oz (87.2 kg)   BMI 26.81 kg/m    GEN: Well nourished, well developed, in no acute distress  HEENT: normal  Neck: no JVD, carotid bruits, or masses Cardiac: Cardiac exam is normal with RRR; no murmurs, rubs, or gallops,no edema  Respiratory:  clear to auscultation bilaterally, normal work of breathing GI: soft, nontender, nondistended, + BS MS: There is atrophy of the biceps shoulder girdle and other upper extremity muscles. Forearm muscles are relatively well preserved. Skin: warm and dry, no rash Neuro:  Alert and Oriented x 3, Strength and sensation are intact Psych: euthymic mood, full affect  Wt Readings from Last 3 Encounters:  05/02/17 192 lb 3.2 oz (87.2 kg)  03/30/14 201 lb (91.2 kg)  03/16/14 210 lb (95.3 kg)      Studies/Labs Reviewed:   EKG:  EKG  Reveals normal sinus rhythm with nondiagnostic Q waves 1 aVL and V6. Overall normal appearing tracing.  Recent Labs: No results found for requested labs within last 8760 hours.   Lipid Panel No results found for: CHOL, TRIG, HDL, CHOLHDL, VLDL, LDLCALC, LDLDIRECT  Additional studies/ records that were reviewed today include:  Echocardiogram 02/24/2017: Study Conclusions  - Left ventricle: The cavity size was normal. Wall thickness was   normal. Systolic function was normal. The estimated ejection   fraction was in the range of 60% to 65%. Left ventricular   diastolic function parameters were normal.  Stress myocardial perfusion study 04/07/2017: Study Highlights    Nuclear stress EF: 41%.  There was no ST segment deviation noted during stress.  There is a small defect of mild severity present in the apex location. The defect is non-reversible and consistent with apical infarct vs. diaphragmatic attenuation artifact. No ischemia noted.  This is an intermediate risk study due to reduced EF.  The left ventricular ejection  fraction is moderately decreased (30-44%).       ASSESSMENT:    1. Facioscapulohumeral muscular dystrophy ([place])   2. Abnormal nuclear stress test   3. Dilated cardiomyopathy ([place])      PLAN:  In order of problems listed above:  1. This variety of muscular dystrophy is not typically associated with hypertrophic or dilated cardiomyopathy as can occur with Duchenne's. Concern was raised after a stress test demonstrated an intermediate risk nuclear study with calculated ejection fraction of 41%. The ejection fraction is not accurate on wall motion studies. The echocardiogram is much more reliable and was totally normal in terms of function and muscle thickness, EF 60-65%. The nuclear study also raise a question concerning a fixed apical defect. This likely represents soft tissue attenuation in the setting of a dysfunctional left hemidiaphragm. I do not believe he  has cardiomyopathy related to muscular dystrophy. 2. The findings on the nuclear stress tests are likely all related a false positive study potentially contributed to by soft tissue attenuation in the setting of a dysfunctional left hemidiaphragm. No significant ischemia was noted. The EF was stated to be 41%. EF by myocardial perfusion imaging is not as accurate as 2-D Doppler echocardiography. The echo demonstrated normal LV systolic function. 3. The patient does not have a cardiomyopathy related to muscular dystrophy. I do not believe further evaluation is necessary.    Medication Adjustments/Labs and Tests Ordered: Current medicines are reviewed at length with the patient today.  Concerns regarding medicines are outlined above.  Medication changes, Labs and Tests ordered today are listed in the Patient Instructions below. Patient Instructions  Medication Instructions:  None  Labwork: None  Testing/Procedures: None  Follow-Up: Your physician recommends that you schedule a follow-up appointment as needed with Dr. Tamala Julian.     Any Other Special Instructions Will Be Listed Below (If Applicable).     If you need a refill on your cardiac medications before your next appointment, please call your pharmacy.      Signed, Sinclair Grooms, MD  05/02/2017 6:23 PM    [place], [place], [place]  [address] Phone: [phone]; Fax: [phone]

## 2017-05-06 ENCOUNTER — Other Ambulatory Visit (HOSPITAL_BASED_OUTPATIENT_CLINIC_OR_DEPARTMENT_OTHER): Payer: Self-pay

## 2017-05-06 DIAGNOSIS — G7100 Muscular dystrophy, unspecified: Secondary | ICD-10-CM

## 2017-05-06 DIAGNOSIS — R5382 Chronic fatigue, unspecified: Secondary | ICD-10-CM

## 2017-05-06 DIAGNOSIS — G4733 Obstructive sleep apnea (adult) (pediatric): Secondary | ICD-10-CM

## 2017-05-07 ENCOUNTER — Ambulatory Visit (HOSPITAL_BASED_OUTPATIENT_CLINIC_OR_DEPARTMENT_OTHER): Payer: BLUE CROSS/BLUE SHIELD | Attending: Internal Medicine | Admitting: Internal Medicine

## 2017-05-07 VITALS — Ht 71.0 in | Wt 190.0 lb

## 2017-05-07 DIAGNOSIS — G7100 Muscular dystrophy, unspecified: Secondary | ICD-10-CM

## 2017-05-07 DIAGNOSIS — G4733 Obstructive sleep apnea (adult) (pediatric): Secondary | ICD-10-CM

## 2017-05-07 DIAGNOSIS — R5382 Chronic fatigue, unspecified: Secondary | ICD-10-CM

## 2017-05-11 NOTE — Procedures (Signed)
   NAME: Luis [name] DATE OF BIRTH:  Oct 13, 1955 MEDICAL RECORD NUMBER [id number]  LOCATION: [facility]  PHYSICIAN: Marius Ditch  DATE OF STUDY: 05/07/2017  SLEEP STUDY TYPE: Positive Airway Pressure Titration               REFERRING PHYSICIAN: Marius Ditch, MD  INDICATION FOR STUDY: Moderate OSA seen on HSAT (AHI 19/hr)  EPWORTH SLEEPINESS SCORE: NA HEIGHT: 5\' 11"  (180.3 cm)  WEIGHT: 190 lb (86.2 kg)    Body mass index is 26.5 kg/m.  NECK SIZE: 15.5 in.  MEDICATIONS Patient self administered medications include: AMBIEN, STOOL SOFTENER. Medications administered during study include Sleep medicine administered - Ambien 10 mg at 10:00:00 PM. I have reviewed the patient's meds in eCW.   SLEEP STUDY TECHNIQUE The patient underwent an attended overnight polysomnography titration to assess the effects of cpap therapy. The following variables were monitored: EEG(C4-A1, C3-A2, O1-A2, O2-A1), EOG, submental and leg EMG, ECG, oxyhemoglobin saturation by pulse oximetry, thoracic and abdominal respiratory effort belts, nasal/oral airflow by pressure sensor, body position sensor and snoring sensor. CPAP pressure was titrated to eliminate apneas, hypopneas and oxygen desaturation.  TECHNICAL COMMENTS Comments added by Technician: PATIENT WAS ORDERED AS A CPAP TITRATION  Comments added by Scorer: N/A  SLEEP ARCHITECTURE The study was initiated at 10:13:14 PM and terminated at 5:11:59 AM. Total recorded time was 418.7 minutes. EEG confirmed total sleep time was 275.5 minutes yielding a sleep efficiency of 65.8%. Sleep onset after lights out was 4.7 minutes with a REM latency of 217.5 minutes. The patient spent 23.41% of the night in stage N1 sleep, 71.87% in stage N2 sleep, 0.00% in stage N3 and 4.72% in REM. The Arousal Index was 33.8/hour.  RESPIRATORY PARAMETERS The overall AHI was 2.8 per hour, and the RDI was 12.6 events/hour with a central apnea index of 0.2per  hour. The patient was titrated to a setting of  CPAP 14 cm H2O. At this setting, the sleep efficiency was 32% and the patient was supine for 74%. The AHI was 10.5 events per hour, and the RDI was 21.0 events/hour (with 0.2 central events) and the arousal index was 26.3 per hour.The oxygen nadir was 94.0% during sleep. Lower pressures seemed to work equally well. There was no development of central apnea.   LEG MOVEMENT DATA The total leg movements were with a resulting leg movement index of 2.8/hr. Associated arousal with leg movement index was 0.4/hr.  CARDIAC DATA The underlying cardiac rhythm was most consistent with sinus rhythm. Mean heart rate during sleep was 52.38 bpm. Additional rhythm abnormalities include None.  IMPRESSIONS - CPAP titration to CPAP of 14 cm water pressure was not successfully at resolving the patient's OSA. However, no CSA developed. - Reduced sleep efficiency with CPAP.   DIAGNOSIS - Obstructive Sleep Apnea (327.23 [G47.33 ICD-10])  RECOMMENDATIONS - Auto adjusting CPAP with minimum pressure of 4 cm water pressure and max pressure of 18 cm water pressure. Heated humidity. Mask of choice - I will see the patient in the [place] in approximately 3-4 months to assure compliance. Periodic downloads will be done between the start of PAP and the visit to the [place], MD Sleep specialist, [place] [employer]  ELECTRONICALLY SIGNED ON:  05/11/2017, 8:17 PM [place] PH: [phone]   FX: [phone] [place]

## 2017-05-21 ENCOUNTER — Ambulatory Visit: Payer: BLUE CROSS/BLUE SHIELD | Admitting: Interventional Cardiology

## 2017-05-21 ENCOUNTER — Ambulatory Visit (HOSPITAL_BASED_OUTPATIENT_CLINIC_OR_DEPARTMENT_OTHER): Payer: BLUE CROSS/BLUE SHIELD | Attending: Internal Medicine | Admitting: Radiology

## 2017-08-05 MED FILL — SHINGRIX VIAL KIT: 50 | 1 days supply | Qty: 1 | Fill #0

## 2017-10-01 ENCOUNTER — Other Ambulatory Visit: Payer: Self-pay | Admitting: Internal Medicine

## 2017-10-01 ENCOUNTER — Ambulatory Visit
Admission: RE | Admit: 2017-10-01 | Discharge: 2017-10-01 | Disposition: A | Discharge status: Home / Self Care | Payer: BLUE CROSS/BLUE SHIELD | Source: Ambulatory Visit | Attending: Internal Medicine | Admitting: Internal Medicine

## 2017-10-01 DIAGNOSIS — M47816 Spondylosis without myelopathy or radiculopathy, lumbar region: Secondary | ICD-10-CM

## 2017-10-07 ENCOUNTER — Telehealth: Payer: Self-pay | Admitting: Neurology

## 2017-10-07 ENCOUNTER — Encounter: Payer: Self-pay | Admitting: Neurology

## 2017-10-07 ENCOUNTER — Other Ambulatory Visit: Payer: Self-pay

## 2017-10-07 ENCOUNTER — Ambulatory Visit: Payer: BLUE CROSS/BLUE SHIELD | Admitting: Neurology

## 2017-10-07 VITALS — BP 108/79 | HR 73 | Ht 71.0 in | Wt 193.0 lb

## 2017-10-07 DIAGNOSIS — M79601 Pain in right arm: Secondary | ICD-10-CM

## 2017-10-07 DIAGNOSIS — G7102 Facioscapulohumeral muscular dystrophy: Secondary | ICD-10-CM | POA: Diagnosis not present

## 2017-10-07 DIAGNOSIS — M79602 Pain in left arm: Secondary | ICD-10-CM | POA: Diagnosis not present

## 2017-10-07 HISTORY — DX: Facioscapulohumeral muscular dystrophy: G71.02

## 2017-10-07 NOTE — Telephone Encounter (Signed)
Pt wife(on DPR) is asking for a call re: Dr Nelva Bush

## 2017-10-07 NOTE — Telephone Encounter (Signed)
I called the wife.  I left a message.  I will send Dr. Nelva Bush a letter with some of the information from [facility].  The diagnosis of the muscular dystrophy was made in 1992, I doubt that there are any records of the original EMG nerve conduction study available.

## 2017-10-07 NOTE — Progress Notes (Signed)
Reason for visit: [name] dystrophy  Referring physician: Dr. Dorethea Clan E [name] is a 62 y.o. 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  The patient has continued to work, he is still working at the present.  Over time, he has had a gradual worsening of low back pain without radiation down the legs.  The patient notes that the low back pain is worse with standing, better with sitting or lying down.  The patient has also had some problems with numbness and tingling sensations in the hands that may wake him up at night.  This has been more significant over the last month or 2.  The patient has poor balance, he falls frequently.  He has not been able to use a walker.  Uses a walking stick to get around.  He has a problem with paralysis of the left hemidiaphragm, he has been placed on a CPAP but could not tolerate the machine due to claustrophobia.  He can take up to 4 hydrocodone daily, he usually will take only 2 tablets on average daily.  The patient reports no problems controlling the bowels or the bladder, he does have some chronic problems with constipation.  The patient has been referred to Luis [name]. Luis [name] [name] for evaluation of low back pain.  He has not yet seen Luis [name]. Luis [name] [name].  The patient has been followed through the [facility] in [facility].  He was last seen 1 year ago.  The patient denies any problems with chewing or swallowing, he has had a 2D echocardiogram of the heart done in the summer 2018.  He does have a cardiomyopathy that is being followed through cardiology.  Past Medical History:  Diagnosis Date  . Bursitis, trochanteric    Episodic  . Carpal tunnel syndrome on right   . Degenerative disk disease    l5-S1  . FSH (facioscapulohumeral muscular dystrophy) 10/07/2017  . GERD (gastroesophageal reflux disease)   . Hemorrhoids    with anal  fissures  . Left lateral epicondylitis   . MS (multiple sclerosis) ([place])     Past Surgical History:  Procedure Laterality Date  . CHOLECYSTECTOMY  2007  . HEMORRHOIDECTOMY WITH HEMORRHOID BANDING      Family History  Problem Relation Age of Onset  . Allergies Brother   . Allergies Sister   . Heart disease Father   . Brain cancer Mother   . Bone cancer Brother     Social history:  reports that  has never smoked. he has never used smokeless tobacco. He reports that he does not drink alcohol or use drugs.  Medications:  Prior to Admission medications   Medication Sig Start Date End Date Taking? Authorizing Provider  Ascorbic Acid (VITAMIN C) 250 MG CHEW Chew 1 tablet by mouth as directed.   Yes [provider]  Carboxymethylcellulose Sodium (REFRESH TEARS OP) Place 1 drop into both eyes 6 (six) times daily.   Yes [provider]  Docusate Calcium (STOOL SOFTENER PO) Take 2 tablets by mouth at bedtime.   Yes [provider]  doxycycline (VIBRA-TABS) 100 MG tablet Take 100 mg by mouth 2 (two) times daily.  04/16/17  Yes [provider]  HYDROcodone-acetaminophen (NORCO) 10-325 MG per tablet Take 1 tablet by mouth every 6 (six) hours as needed.   Yes [provider]  Naloxegol Oxalate (MOVANTIK PO) Take 1 tablet by mouth daily as needed for constipation.   Yes [provider]  OVER THE COUNTER MEDICATION Place 1 Application into both eyes nightly. Med Name: Systane night time ointment   Yes [provider]  prednisoLONE acetate (PRED FORTE) 1 % ophthalmic suspension Place 1 drop into both eyes 2 (two) times daily.  04/16/17  Yes [provider]  Tobramycin-Dexamethasone (TOBRADEX OP) Apply 1 Dose to eye at bedtime.   Yes [provider]  zolpidem (AMBIEN) 10 MG tablet Take 10 mg by mouth at bedtime as needed for sleep.   Yes [provider]     No Known Allergies  ROS:  Out of a complete 14  system review of symptoms, the patient complains only of the following symptoms, and all other reviewed systems are negative.  Back pain Hand numbness, discomfort Gait disturbance, falls  Blood pressure 108/79, pulse 73, height 5\' 11"  (1.803 m), weight 193 lb (87.5 kg).  Physical Exam  General: The patient is alert and cooperative at the time of the examination.  Eyes: Pupils are equal, round, and reactive to light. Discs are flat bilaterally.  Neck: The neck is supple, no carotid bruits are noted.  Respiratory: The respiratory examination is clear.  Cardiovascular: The cardiovascular examination reveals a regular rate and rhythm, no obvious murmurs or rubs are noted.  Skin: Extremities are without significant edema.  Neurologic Exam  Mental status: The patient is alert and oriented x 3 at the time of the examination. The patient has apparent normal recent and remote memory, with an apparently normal attention span and concentration ability.  Cranial nerves: Facial symmetry is present. There is good sensation of the face to pinprick and soft touch bilaterally. The strength of the facial muscles and the muscles to head turning and shoulder shrug are normal bilaterally. Speech is well enunciated, no aphasia or dysarthria is noted. Extraocular movements are full. Visual fields are full. The tongue is midline, and the patient has symmetric elevation of the soft palate. No obvious hearing deficits are noted.  Motor: The motor testing reveals 2 to 3/5 strength with the biceps, triceps, and deltoid muscles bilaterally.  He has slight weakness of intrinsic muscles of the hands, he has good grip strength bilaterally.  With the lower extremities, there is 4-/5 strength with hip flexion bilaterally and 3/5 strength with abduction and abduction of the knees bilaterally. 4/5 strength is seen with the hamstrings bilaterally, good extensor strength at the knees is seen.  The patient has bilateral foot  drops.  Sensory: Sensory testing is intact to pinprick, soft touch, vibration sensation, and position sense on all 4 extremities. No evidence of extinction is noted.  Coordination: Cerebellar testing reveals good finger-nose-finger and heel-to-shin bilaterally, but the patient has difficulty performing these tasks secondary to weakness.  Gait and station: The patient is able to rise from a seated position using the Gower maneuver.  Once up, he has a waddling gait pattern, wide-based.  Uses a walking stick for ambulation.  Tandem gait was not attempted.  Reflexes: Deep tendon reflexes are symmetric, but are depressed bilaterally with the exception of the ankle jerk reflexes are well-maintained. Toes are downgoing bilaterally.   Assessment/Plan:  1. Luis [name] [name] muscular dystrophy  2.  Chronic low back pain  3.  Bilateral hand numbness, discomfort  4.  Respiratory compromise, left hemidiaphragm  5.  Cardiomyopathy  The patient is to be seen by Luis [name]. Luis [name] [name].  Recent x-rays of  the lumbar spine were done and do not appear to show severe scoliosis.  The patient last had MRI evaluation of the lumbar spine done in 2014.  This does show some facet joint arthritis that is present at multiple levels.  The patient reports back pain that could be consistent with facet joint discomfort.  The referral to the pain center is appropriate, injections in the back may be of some benefit.  The patient is not taking high doses of opiate medications which is good, given his respiratory compromise this could lead to significant central sleep apnea.  The patient will be set up for nerve conduction studies on both hands, EMG on one arm to evaluate for possible carpal tunnel syndrome or cervical radiculopathy.  He will otherwise follow-up in 6 months.  Jill Alexanders MD 10/07/2017 8:58 AM  [facility] [name] [place], [place] [address]  Phone [phone] Fax [phone]

## 2017-10-07 NOTE — Patient Instructions (Signed)
We will get EMG and NCV evaluation.

## 2017-10-07 NOTE — Telephone Encounter (Signed)
Called wife back. She said she called Dr. Nelva Bush office to see if he got the referral from Dr. Inda Merlin for possible ESI for back pain. They had not. She called Anderson Malta at Dr. Inda Merlin office and she resent the referral to Dr. Nelva Bush office. She called Dr. Nelva Bush office back and they said until they know what they are dealing with, they cannot scheduled an appt. They want results of EMG/NCS from [facility] and lab results sent to Dr. Nelva Bush office.   Wife worried because with Kaelob's job, they do not know he has muscular dystrophy. He is afraid of losing his job.   Wife wondering if Dr. Jannifer Franklin can call Dr. Nelva Bush to discuss getting him in for an appt. Advised I will send message to Dr. Jannifer Franklin and we will call. She said okay to return call tomorrow.

## 2017-10-08 MED FILL — SHINGRIX VIAL KIT: 50 | 1 days supply | Qty: 1 | Fill #1

## 2017-10-08 NOTE — Telephone Encounter (Signed)
Faxed letter from Dr. Jannifer Franklin to Dr. Nelva Bush office at [phone]. Received fax confirmation.

## 2017-10-20 ENCOUNTER — Encounter: Payer: Self-pay | Admitting: Neurology

## 2017-10-20 ENCOUNTER — Ambulatory Visit: Payer: BLUE CROSS/BLUE SHIELD | Admitting: Neurology

## 2017-10-20 ENCOUNTER — Ambulatory Visit (INDEPENDENT_AMBULATORY_CARE_PROVIDER_SITE_OTHER): Payer: BLUE CROSS/BLUE SHIELD | Admitting: Neurology

## 2017-10-20 ENCOUNTER — Telehealth: Payer: Self-pay | Admitting: Neurology

## 2017-10-20 DIAGNOSIS — M79602 Pain in left arm: Secondary | ICD-10-CM

## 2017-10-20 DIAGNOSIS — R202 Paresthesia of skin: Secondary | ICD-10-CM

## 2017-10-20 DIAGNOSIS — M79601 Pain in right arm: Secondary | ICD-10-CM

## 2017-10-20 DIAGNOSIS — G7102 Facioscapulohumeral muscular dystrophy: Secondary | ICD-10-CM

## 2017-10-20 NOTE — Telephone Encounter (Signed)
Patient's wife is calling to discuss scheduling for an MRI. She can be reached at [phone] or [phone].

## 2017-10-20 NOTE — Telephone Encounter (Signed)
Spoke to patients wife on the dpr she stated she would like her husband to go to [facility] due to the cost will be cheaper.

## 2017-10-20 NOTE — Procedures (Signed)
     HISTORY:  Luis [name] is a 62 year old patient with a history of [name] muscular dystrophy with chronic progressive weakness in the arms and legs.  He has more recently had numbness and pain in both hands, left greater than right.  He denies any significant neck discomfort per se.  He is being evaluated for a possible neuropathy or a cervical radiculopathy.  NERVE CONDUCTION STUDIES:  Nerve conduction studies were performed on both upper extremities.  The distal motor latencies for the median nerves were prolonged on the left, normal on the right, with normal motor amplitudes for these nerves bilaterally.  The distal motor latencies and motor amplitudes for the ulnar nerves were normal bilaterally.  The nerve conduction velocities for the median and ulnar nerves were normal bilaterally.  The sensory latencies for the median nerves were prolonged on the left, normal on the right, and normal for the ulnar nerves bilaterally.  The F-wave latencies for the median and ulnar nerves were normal bilaterally.  EMG STUDIES:  EMG study was performed on the right upper extremity:  The first dorsal interosseous muscle reveals 2 to 4 K units with full recruitment. No fibrillations or positive waves were noted. The abductor pollicis brevis muscle reveals 2 to 4 K units with full recruitment. No fibrillations or positive waves were noted. The extensor indicis proprius muscle reveals 1 to 3 K units with full recruitment. No fibrillations or positive waves were noted. The pronator teres muscle reveals 2 to 3 K units with slightly reduced recruitment. No fibrillations or positive waves were noted. The biceps muscle reveals 0.5 to 1 K units with increased recruitment. No fibrillations or positive waves were noted. The triceps muscle reveals 1 to 2 K units with increased recruitment. No fibrillations or positive waves were noted. The anterior deltoid muscle reveals 2 to 3 K units with slightly reduced recruitment.  No fibrillations or positive waves were noted. The cervical paraspinal muscles were tested at 2 levels. No abnormalities of insertional activity were seen at either level tested. There was good relaxation.   IMPRESSION:  Nerve conduction studies done on both upper extremities shows evidence of a mild left carpal tunnel syndrome, no abnormalities were seen on the right arm.  EMG evaluation of the right upper extremity shows no clear evidence of an overlying cervical radiculopathy but prominent myopathic changes were noted in the biceps and triceps muscles consistent with the diagnosis of [name] muscular dystrophy.  Jill Alexanders MD 10/20/2017 10:29 AM  [facility] [address] [place], [place] [address]  Phone [phone] Fax [phone]

## 2017-10-20 NOTE — Progress Notes (Addendum)
The patient comes in today for EMG nerve conduction study evaluation.  He has been complaining of numbness and some discomfort in both hands.  He denies significant neck pain or pain down the arms but he does have numbness in the C6 distribution on the right hand.  Nerve conduction studies are unremarkable in the right arm, so mild carpal tunnel syndrome on the left.  EMG study shows myopathic changes in the biceps and triceps muscles primarily.  These changes may mask a potential C6 nerve root issue.  The patient will be set up for MRI of the cervical spine.  The patient has a resting tremor involving the right upper extremity, we will need to follow this over time looking for any developing signs of parkinsonism.  The patient is to wear a wrist splint on the left wrist over the next 2 months, he will call if his symptoms are not improving.      [place]    Nerve / Sites Muscle Latency Ref. Amplitude Ref. Rel Amp Segments Distance Velocity Ref. Area    ms ms mV mV %  cm m/s m/s mVms  L Median - APB     Wrist APB 5.9 ?4.4 5.8 ?4.0 100 Wrist - APB 7   21.7     Upper arm APB 10.4  5.6  97.1 Upper arm - Wrist 24 54 ?49 21.2  R Median - APB     Wrist APB 3.8 ?4.4 11.7 ?4.0 100 Wrist - APB 7   34.3     Upper arm APB 7.9  11.0  94.2 Upper arm - Wrist 25 60 ?49 33.3  L Ulnar - ADM     Wrist ADM 2.3 ?3.3 8.1 ?6.0 100 Wrist - ADM 7   27.6     B.Elbow ADM 6.3  7.6  94.6 B.Elbow - Wrist 22 56 ?49 25.7     A.Elbow ADM 8.1  7.3  95.4 A.Elbow - B.Elbow 10 56 ?49 25.2         A.Elbow - Wrist      R Ulnar - ADM     Wrist ADM 2.5 ?3.3 12.0 ?6.0 100 Wrist - ADM 7   37.0     B.Elbow ADM 6.0  12.0  100 B.Elbow - Wrist 22 62 ?49 36.9     A.Elbow ADM 7.7  11.5  95.9 A.Elbow - B.Elbow 10 62 ?49 36.1         A.Elbow - Wrist                    SNC    Nerve / Sites Rec. Site Peak Lat Amp Segments Distance    ms V  cm  R Median - Orthodromic (Dig II, Mid palm)     Dig II Wrist 3.8 17 Dig II - Wrist 13   L Median - Orthodromic (Dig II, Mid palm)     Dig II Wrist 5.7 5 Dig II - Wrist 13  L Ulnar - Orthodromic, (Dig V, Mid palm)     Dig V Wrist 2.7 22 Dig V - Wrist 11  R Ulnar - Orthodromic, (Dig V, Mid palm)     Dig V Wrist 3.3 8 Dig V - Wrist 1             F  Wave    Nerve F Lat Ref.   ms ms  L Median - APB 30.7 ?31.0  L Ulnar - ADM 28.2 ?32.0  R Median -  APB 30.4 ?31.0  R Ulnar - ADM 30.3 ?32.0             EMG

## 2017-10-20 NOTE — Progress Notes (Signed)
Please refer to EMG and nerve conduction study procedure note. 

## 2017-10-30 ENCOUNTER — Other Ambulatory Visit: Payer: Self-pay | Admitting: Internal Medicine

## 2017-10-30 DIAGNOSIS — G8929 Other chronic pain: Secondary | ICD-10-CM

## 2017-10-30 DIAGNOSIS — M545 Low back pain: Principal | ICD-10-CM

## 2017-11-08 ENCOUNTER — Other Ambulatory Visit: Payer: BLUE CROSS/BLUE SHIELD

## 2017-11-14 ENCOUNTER — Other Ambulatory Visit: Payer: BLUE CROSS/BLUE SHIELD

## 2017-11-14 ENCOUNTER — Ambulatory Visit
Admission: RE | Admit: 2017-11-14 | Discharge: 2017-11-14 | Disposition: A | Discharge status: Home / Self Care | Payer: BLUE CROSS/BLUE SHIELD | Source: Ambulatory Visit | Attending: Internal Medicine | Admitting: Internal Medicine

## 2017-11-14 ENCOUNTER — Ambulatory Visit
Admission: RE | Admit: 2017-11-14 | Discharge: 2017-11-14 | Disposition: A | Discharge status: Home / Self Care | Payer: BLUE CROSS/BLUE SHIELD | Source: Ambulatory Visit | Attending: Neurology | Admitting: Neurology

## 2017-11-14 DIAGNOSIS — R202 Paresthesia of skin: Secondary | ICD-10-CM | POA: Diagnosis not present

## 2017-11-14 DIAGNOSIS — G8929 Other chronic pain: Secondary | ICD-10-CM

## 2017-11-14 DIAGNOSIS — M79601 Pain in right arm: Secondary | ICD-10-CM

## 2017-11-14 DIAGNOSIS — M545 Low back pain: Principal | ICD-10-CM

## 2017-11-14 DIAGNOSIS — M79602 Pain in left arm: Secondary | ICD-10-CM

## 2017-11-14 MED ORDER — METHYLPREDNISOLONE ACETATE 40 MG/ML INJ SUSP (RADIOLOG
120.0000 mg | Freq: Once | INTRAMUSCULAR | Status: AC
  Administered 2017-11-14: 120 mg via EPIDURAL

## 2017-11-14 MED ORDER — IOPAMIDOL (ISOVUE-M 200) INJECTION 41%
1.0000 mL | Freq: Once | INTRAMUSCULAR | Status: AC
Start: 2017-11-14 — End: 2017-11-14
  Administered 2017-11-14: 1 mL via EPIDURAL

## 2017-11-14 NOTE — Discharge Instructions (Signed)
Post Procedure Spinal Discharge Instruction Sheet  1. You may resume a regular diet and any medications that you routinely take (including pain medications).  2. No driving day of procedure.  3. Light activity throughout the rest of the day.  Do not do any strenuous work, exercise, bending or lifting.  The day following the procedure, you can resume normal physical activity but you should refrain from exercising or physical therapy for at least three days thereafter.   Common Side Effects:   Headaches- take your usual medications as directed by your physician.  Increase your fluid intake.  Caffeinated beverages may be helpful.  Lie flat in bed until your headache resolves.   Restlessness or inability to sleep- you may have trouble sleeping for the next few days.  Ask your referring physician if you need any medication for sleep.   Facial flushing or redness- should subside within a few days.   Increased pain- a temporary increase in pain a day or two following your procedure is not unusual.  Take your pain medication as prescribed by your referring physician.   Leg cramps  Please contact our office at [phone] for the following symptoms:  Fever greater than 100 degrees.  Headaches unresolved with medication after 2-3 days.  Increased swelling, pain, or redness at injection site.  Thank you for visiting our office.  May resume Goody's or BC's today.

## 2017-11-16 ENCOUNTER — Telehealth: Payer: Self-pay | Admitting: Neurology

## 2017-11-16 NOTE — Telephone Encounter (Signed)
I called the patient, left a message.  MRI of the cervical spine shows a potential compression of the left C4 nerve root, but this would not cause paresthesias down the arms into the hands on either side.  It is possible that some of his symptoms may be related to muscle spasm or muscle tension in the neck and shoulder.  Nothing on the MRI would suggest a need for surgical considerations.   MRI cervical 11/15/17:  IMPRESSION: This MRI of the cervical spine without contrast shows the following: 1.    The spinal cord appears normal. 2.    At C2-C3 there is a central disc protrusion contacting the thecal sac but not causing any nerve root compression. 3.    At C3-C4, there is moderately severe left foraminal narrowing and mild spinal stenosis.  There is potential for left C4 nerve root compression. 4.    At C4-C5, there is borderline spinal stenosis but no nerve root compression. 5.    At C5-C6, there is moderate left foraminal narrowing but there does not appear to be nerve root compression.

## 2017-12-19 ENCOUNTER — Encounter: Payer: Self-pay | Admitting: Neurology

## 2017-12-19 ENCOUNTER — Ambulatory Visit: Payer: BLUE CROSS/BLUE SHIELD | Admitting: Neurology

## 2017-12-19 ENCOUNTER — Other Ambulatory Visit: Payer: Self-pay | Admitting: Internal Medicine

## 2017-12-19 VITALS — BP 123/78 | HR 62 | Ht 71.0 in | Wt 185.5 lb

## 2017-12-19 DIAGNOSIS — M545 Low back pain: Principal | ICD-10-CM

## 2017-12-19 DIAGNOSIS — R251 Tremor, unspecified: Secondary | ICD-10-CM

## 2017-12-19 DIAGNOSIS — G3281 Cerebellar ataxia in diseases classified elsewhere: Secondary | ICD-10-CM | POA: Diagnosis not present

## 2017-12-19 DIAGNOSIS — R0609 Other forms of dyspnea: Secondary | ICD-10-CM

## 2017-12-19 DIAGNOSIS — G7102 Facioscapulohumeral muscular dystrophy: Secondary | ICD-10-CM

## 2017-12-19 DIAGNOSIS — R0600 Dyspnea, unspecified: Secondary | ICD-10-CM

## 2017-12-19 DIAGNOSIS — G8929 Other chronic pain: Secondary | ICD-10-CM

## 2017-12-19 NOTE — Progress Notes (Signed)
Reason for visit: [name] muscular dystrophy  Luis [name] is an 62 y.o. male  History of present illness:  Luis [name] is a 62 year old right-handed white male with a history of FSH muscular dystrophy.  The patient has had gradual progression of weakness of the proximal arms and legs and bilateral foot drops.  The patient has a gait disorder, he has fallen on occasion.  He uses a walking stick for ambulation.  He has a left hemidiaphragm, he has seen Dr. Melvyn Novas in the past.  The patient uses BiPAP at night.  He denies any shortness of breath at nighttime, but he does have some increasing problems with dyspnea on exertion.  The patient was recently seen by Dr. Tillman Abide, the Trilogy system was recommended.  The patient is not sure whether or not he wants to pursue this.  The patient comes in with a new problem today.  Indicates that over the last 6 months, particularly over the last 3 months he has had worsening problems with a resting tremor involving the right upper extremity.  The wife claims that she has occasionally seen some tremor in the right leg as well.  The tremor is suppressed when the patient is using the arm.  He has had some change in handwriting with more sloppy handwriting.  He denies much tremor with feeding himself.  He will be going for eye surgery on the left eye in the next several weeks.  He returns for an evaluation.  Prior EMG and nerve conduction study showed a mild left carpal tunnel syndrome.  No evidence of a cervical radiculopathy was determined.  MRI of the cervical spine was done and showed possible impingement of the right C4 nerve root, no definite surgically amenable issues were noted.  Past Medical History:  Diagnosis Date  . Bursitis, trochanteric    Episodic  . Carpal tunnel syndrome on right   . Degenerative disk disease    l5-S1  . FSH (facioscapulohumeral muscular dystrophy) 10/07/2017  . GERD (gastroesophageal reflux disease)   . Hemorrhoids    with anal  fissures  . Left lateral epicondylitis   . MS (multiple sclerosis) ([place])     Past Surgical History:  Procedure Laterality Date  . CHOLECYSTECTOMY  2007  . HEMORRHOIDECTOMY WITH HEMORRHOID BANDING      Family History  Problem Relation Age of Onset  . Allergies Brother   . Allergies Sister   . Heart disease Father   . Brain cancer Mother   . Bone cancer Brother     Social history:  reports that he has never smoked. He has never used smokeless tobacco. He reports that he does not drink alcohol or use drugs.   No Known Allergies  Medications:  Prior to Admission medications   Medication Sig Start Date End Date Taking? Authorizing Provider  Ascorbic Acid (VITAMIN C) 250 MG CHEW Chew 1 tablet by mouth as directed.    [provider]  Carboxymethylcellulose Sodium (REFRESH TEARS OP) Place 1 drop into both eyes 6 (six) times daily.    [provider]  Docusate Calcium (STOOL SOFTENER PO) Take 2 tablets by mouth at bedtime.    [provider]  doxycycline (VIBRA-TABS) 100 MG tablet Take 100 mg by mouth 2 (two) times daily.  04/16/17   [provider]  HYDROcodone-acetaminophen (NORCO) 10-325 MG per tablet Take 1 tablet by mouth every 6 (six) hours as needed.    [provider]  Naloxegol Oxalate (MOVANTIK PO)  Take 1 tablet by mouth daily as needed for constipation.    [provider]  OVER THE COUNTER MEDICATION Place 1 Application into both eyes nightly. Med Name: Systane night time ointment    [provider]  prednisoLONE acetate (PRED FORTE) 1 % ophthalmic suspension Place 1 drop into both eyes 2 (two) times daily.  04/16/17   [provider]  Tobramycin-Dexamethasone (TOBRADEX OP) Apply 1 Dose to eye at bedtime.    [provider]  zolpidem (AMBIEN) 10 MG tablet Take 10 mg by mouth at bedtime as needed for sleep.    [provider]    ROS:  Out of a complete 14 system review of symptoms, the  patient complains only of the following symptoms, and all other reviewed systems are negative.  Tremor Shortness of breath Gait instability Weakness  Blood pressure 123/78, pulse 62, height 5\' 11"  (1.803 m), weight 185 lb 8 oz (84.1 kg).  Physical Exam  General: The patient is alert and cooperative at the time of the examination.  Skin: No significant peripheral edema is noted.   Neurologic Exam  Mental status: The patient is alert and oriented x 3 at the time of the examination. The patient has apparent normal recent and remote memory, with an apparently normal attention span and concentration ability.   Cranial nerves: Facial symmetry is present. Speech is normal, no aphasia or dysarthria is noted. Extraocular movements are full. Visual fields are full.  Motor: The patient has good strength with grips bilaterally, the patient has severe weakness with biceps and triceps strength, and 4/5 with deltoid muscle strength bilaterally.  Patient has prominent bilateral foot drops, significant weakness with adduction of the legs, better strength with abduction.  The patient has 4/5 strength with knee flexion and extension.  Sensory examination: Soft touch sensation is symmetric on the face, arms, and legs.  Coordination: The patient has good finger-nose-finger and heel-to-shin bilaterally.  Intermittently, a resting tremor is noted with the right upper extremity.  Gait and station: The patient has wide-based, bilateral steppage gait pattern.  The patient walks with a walking stick  Reflexes: Deep tendon reflexes are symmetric.   MRI cervical 11/14/17:  IMPRESSION: This MRI of the cervical spine without contrast shows the following: 1.    The spinal cord appears normal. 2.    At C2-C3 there is a central disc protrusion contacting the thecal sac but not causing any nerve root compression. 3.    At C3-C4, there is moderately severe left foraminal narrowing and mild spinal stenosis.  There  is potential for left C4 nerve root compression. 4.    At C4-C5, there is borderline spinal stenosis but no nerve root compression. 5.    At C5-C6, there is moderate left foraminal narrowing but there does not appear to be nerve root compression.  * MRI scan images were reviewed online. I agree with the written report.    Assessment/Plan:  1.  [place] muscular dystrophy  2.  Right upper extremity resting tremor  3.  Gait disorder  4.  Dyspnea on exertion  The patient will have another referral to Dr. Melvyn Novas for evaluation of increasing dyspnea on exertion.  Given the onset of the resting tremor and a history of a cardiomyopathy, the patient will be sent for CT of the brain to exclude a deep brain infarct.  The patient will not be treated with medications for the tremor currently, we will follow this over time looking for signs of developing  parkinsonism.  The patient will follow-up in 5 months.  Jill Alexanders MD 12/19/2017 9:04 AM  [facility] [address] [place], [place] [address]  Phone [phone] Fax [phone]

## 2017-12-19 NOTE — Patient Instructions (Signed)
   We will get a CT of the brain and get a pulmonary evaluation.

## 2017-12-22 ENCOUNTER — Telehealth: Payer: Self-pay | Admitting: Neurology

## 2017-12-22 NOTE — Telephone Encounter (Signed)
[employer] Auth: [id number] (exp. 12/22/17 to 01/20/18) order sent to GI. They will reach out to the pt to schedule.

## 2017-12-23 ENCOUNTER — Ambulatory Visit
Admission: RE | Admit: 2017-12-23 | Discharge: 2017-12-23 | Disposition: A | Discharge status: Home / Self Care | Payer: BLUE CROSS/BLUE SHIELD | Source: Ambulatory Visit | Attending: Neurology | Admitting: Neurology

## 2017-12-23 DIAGNOSIS — R251 Tremor, unspecified: Secondary | ICD-10-CM | POA: Diagnosis not present

## 2017-12-24 ENCOUNTER — Telehealth: Payer: Self-pay | Admitting: Neurology

## 2017-12-24 NOTE — Telephone Encounter (Signed)
  I called the patient.  The CT scan of the head is normal, we will follow the resting tremor over time look for any evidence of evolving parkinsonian features.  CT head 12/23/17:  IMPRESSION: Unremarkable CT scan of the head without contrast.

## 2017-12-29 ENCOUNTER — Other Ambulatory Visit: Payer: BLUE CROSS/BLUE SHIELD

## 2018-01-01 ENCOUNTER — Ambulatory Visit
Admission: RE | Admit: 2018-01-01 | Discharge: 2018-01-01 | Disposition: A | Discharge status: Home / Self Care | Payer: BLUE CROSS/BLUE SHIELD | Source: Ambulatory Visit | Attending: Internal Medicine | Admitting: Internal Medicine

## 2018-01-01 ENCOUNTER — Other Ambulatory Visit: Payer: BLUE CROSS/BLUE SHIELD

## 2018-01-01 DIAGNOSIS — M545 Low back pain: Principal | ICD-10-CM

## 2018-01-01 DIAGNOSIS — G8929 Other chronic pain: Secondary | ICD-10-CM

## 2018-01-01 MED ORDER — IOPAMIDOL (ISOVUE-M 200) INJECTION 41%
1.0000 mL | Freq: Once | INTRAMUSCULAR | Status: AC
Start: 2018-01-01 — End: 2018-01-01
  Administered 2018-01-01: 1 mL via EPIDURAL

## 2018-01-01 MED ORDER — METHYLPREDNISOLONE ACETATE 40 MG/ML INJ SUSP (RADIOLOG
120.0000 mg | Freq: Once | INTRAMUSCULAR | Status: AC
  Administered 2018-01-01: 120 mg via EPIDURAL

## 2018-01-01 NOTE — Discharge Instructions (Signed)

## 2018-01-19 ENCOUNTER — Institutional Professional Consult (permissible substitution): Payer: BLUE CROSS/BLUE SHIELD | Admitting: Internal Medicine

## 2018-01-19 ENCOUNTER — Telehealth: Payer: Self-pay | Admitting: Neurology

## 2018-01-19 NOTE — Telephone Encounter (Signed)
For some reason, received the report of the CT of the head, this was done in April, this has been called to the patient.   CT head 01/19/18:  IMPRESSION: Negative noncontrast CT HEAD for age.

## 2018-02-27 ENCOUNTER — Other Ambulatory Visit: Payer: Self-pay | Admitting: Internal Medicine

## 2018-02-27 DIAGNOSIS — M7989 Other specified soft tissue disorders: Secondary | ICD-10-CM

## 2018-03-02 ENCOUNTER — Ambulatory Visit
Admission: RE | Admit: 2018-03-02 | Discharge: 2018-03-02 | Disposition: A | Discharge status: Home / Self Care | Payer: BLUE CROSS/BLUE SHIELD | Source: Ambulatory Visit | Attending: Internal Medicine | Admitting: Internal Medicine

## 2018-03-02 DIAGNOSIS — M7989 Other specified soft tissue disorders: Secondary | ICD-10-CM

## 2018-03-04 ENCOUNTER — Ambulatory Visit: Payer: BLUE CROSS/BLUE SHIELD | Admitting: Internal Medicine

## 2018-03-04 ENCOUNTER — Encounter: Payer: Self-pay | Admitting: Internal Medicine

## 2018-03-04 ENCOUNTER — Ambulatory Visit (INDEPENDENT_AMBULATORY_CARE_PROVIDER_SITE_OTHER)
Admission: RE | Admit: 2018-03-04 | Discharge: 2018-03-04 | Disposition: A | Discharge status: Home / Self Care | Payer: BLUE CROSS/BLUE SHIELD | Source: Ambulatory Visit | Attending: Internal Medicine | Admitting: Internal Medicine

## 2018-03-04 VITALS — BP 116/74 | HR 55 | Ht 71.0 in | Wt 179.0 lb

## 2018-03-04 DIAGNOSIS — J986 Disorders of diaphragm: Secondary | ICD-10-CM

## 2018-03-04 DIAGNOSIS — M7989 Other specified soft tissue disorders: Secondary | ICD-10-CM

## 2018-03-04 NOTE — Patient Instructions (Addendum)
Monitor your swallowing and whether you cough after meals or liquids which would indicate you need a  formal swallowing evaluation by speech therapy  Please remember to go to the  x-ray department downstairs in the basement  for your tests - we will call you with the results when they are available.  Pulmonary follow up is as needed

## 2018-03-04 NOTE — Progress Notes (Signed)
Subjective:    Patient ID: Luis [name], male    DOB: [birth year] MRN: [id number]    Brief patient profile:  33 yowm with MD never smoker never resp problems until   in 1990s with freq resp problems " always starting in sinuses" freq complicated by bad coughing lots of mucinex and allegra alkaseltzer and pred paks and narcotic cough syrups then changed pattern as of  June 27th 2015 acutely  felt sore throat (new) but assos with  sinus congestion [name] drainage rx phoned in augmentin and mucinex no better moved into chest then cough med and pred and referred to pulmonary 03/16/14 by Dr Luis [name] with abn cxr = L HD elevated referred to pulmonary clinic 03/16/14    History of Present Illness  03/16/2014 1st [facility] office visit/ Luis [name]  Chief Complaint  Patient presents with  . Pulmonary Consult    Referred per Dr. Josetta [name] for eval of abnormal cxr and cough. Pt c/o cough for the past 3 wks- prod with moderate clear sputum.    still has intermittent watery drainage but no correlation to dry cough day > night (robitussin) rec Pantoprazole (protonix) 40 mg   Take 30-60 min before first meal of the day and Pepcid 20 mg one bedtime until return to office - this is the best way to tell whether stomach acid is contributing to your problem.   GERD diet     03/30/2014 f/u ov/Luis [name] re: Paralyzed L HD  Chief Complaint  Patient presents with  . Follow-up    Pt reports his cough is unchanged since last visit. No new co's today.    Not limited by breathing from desired activities and still working as Naval architect for [employer]. Doe occ have LUQ discomfort tranisient anteriorly always relieve supine, more likely in sitting position  rec Allegra / zyrtec/ chlortrimeton (the order is more sedating but more effective for a drippy nose) Please see patient coordinator before you leave today  to reschedule  pfts with NIF Treatment of pain under your diaphragm consists of avoiding foods  that cause gas (especially boiled eggs/ beans and raw vegetables like spinach and salads)  and citrucel 1 heaping tsp twice daily with a large glass of water.  Pain should improve w/in 2 weeks and if not then consider further GI work up.       03/04/2018   ov/Luis [name] re-establish re  MD/   new doe Chief Complaint  Patient presents with  . Pulmonary Consult    Referred by Dr. Margette [name]. Pt c/o increased SOB x 3 months. He states he gets out of breath walking short distances such as from room to room.    seasonal rhinitis still using prn zyrtec freq call in for zpak  Dyspnea:  Gradually worse x one year esp x 6 months can still do HT leaning on cart freq stops  MMRC3 = can't walk 100 yards even at a slow pace at a flat grade s stopping due to sob   Cough: doing ok, including eating but sometimes  chokse on liquids avg maybe once a wek   SABA use: no inhalers  02: none  Using cane x one year with less strength in R arm and both equally weaker  Nl pace in [place] office s desats / could not tol cpap by Luis [name]   No obvious day to day or daytime variability or assoc excess/ purulent sputum or mucus plugs or hemoptysis or cp or chest tightness,  subjective wheeze or overt sinus or hb symptoms.   Sleeping: sleeps on 3 pillows / avoids lying down on L side  without nocturnal  or early am exacerbation  of respiratory  c/o's or need for noct saba. Also denies any obvious fluctuation of symptoms with weather or environmental changes or other aggravating or alleviating factors except as outlined above   No unusual exposure hx or h/o childhood pna/ asthma or knowledge of premature birth.  Current Allergies, Complete Past Medical History, Past Surgical History, Family History, and Social History were reviewed in [employer] record.  ROS  The following are not active complaints unless bolded Hoarseness, sore throat, dysphagia, dental problems, itching, sneezing,  nasal congestion  or discharge of excess mucus or purulent secretions, ear ache,   fever, chills, sweats, unintended wt loss or wt gain, classically pleuritic or exertional cp,  orthopnea pnd or arm/hand swelling  or leg swelling R, presyncope, palpitations, abdominal pain, anorexia, nausea, vomiting, diarrhea  or change in bowel habits or change in bladder habits, change in stools or change in urine, dysuria, hematuria,  rash, arthralgias, visual complaints, headache, numbness, weakness or ataxia or problems with walking or coordination,  change in mood or  memory.        Current Meds  Medication Sig  . Carboxymethylcellulose Sodium (REFRESH TEARS OP) Place 1 drop into both eyes 6 (six) times daily.  Luis [name] Calcium (STOOL SOFTENER PO) Take 2 tablets by mouth at bedtime.  Marland Kitchen doxycycline (VIBRA-TABS) 100 MG tablet Take 100 mg by mouth 2 (two) times daily.  . famotidine (PEPCID) 20 MG tablet Take 20 mg by mouth at bedtime.  Marland Kitchen ibuprofen (ADVIL,MOTRIN) 800 MG tablet Take 800 mg by mouth 2 (two) times daily.  . methocarbamol (ROBAXIN) 500 MG tablet Take 1,000 mg by mouth at bedtime as needed for muscle spasms.  . naloxegol oxalate (MOVANTIK) 25 MG TABS tablet Take 25 mg by mouth daily.  Marland Kitchen oxyCODONE-acetaminophen (PERCOCET) 10-325 MG tablet Take 1 tablet by mouth every 6 (six) hours as needed for pain.  Marland Kitchen zolpidem (AMBIEN) 10 MG tablet Take 10 mg by mouth at bedtime as needed for sleep.                             Objective:   Physical Exam  03/04/2018         179 03/30/2014       201     03/16/14 210 lb (95.255 kg)      Vital signs reviewed - Note on arrival 02 sats  100% on RA     chronically ill wm walks with cane very awkward gait   HEENT: nl dentition, turbinates bilaterally, and oropharynx. Nl external ear canals without cough reflex   NECK :  without JVD/Nodes/TM/ nl carotid upstrokes bilaterally   LUNGS: no acc muscle use,  Nl contour chest with decreased bs L base  without cough on  insp or exp maneuvers   CV:  RRR  no s3 or murmur or increase in P2, and no edema   ABD:  soft and nontender with very poor inspiratory excursion in the supine position. No bruits or organomegaly appreciated, bowel sounds nl  MS:  Nl gait/ ext warm without deformities, calf tenderness, cyanosis or clubbing No obvious joint restrictions   SKIN: warm and dry without lesions    NEURO:  alert, approp, nl sensorium with  no motor or cerebellar deficits  apparent.        CXR PA and Lateral:   03/04/2018 :    I personally reviewed images and agree with radiology impression as follows:    Stable moderate elevation of the left hemidiaphragm, which could be due to eventration, paresis or plegia. Mild left basilar scarring versus atelectasis.       Assessment & Plan:

## 2018-03-06 ENCOUNTER — Encounter: Payer: Self-pay | Admitting: Internal Medicine

## 2018-03-06 DIAGNOSIS — M7989 Other specified soft tissue disorders: Secondary | ICD-10-CM | POA: Insufficient documentation

## 2018-03-06 NOTE — Assessment & Plan Note (Addendum)
Venous dopplers 03/02/18 neg on R   So likely related to venous insufficiency and not chf or cor pulmonale as absent on L > rec elastic hose/ elevation.

## 2018-03-06 NOTE — Assessment & Plan Note (Signed)
Baseline cxr nl 12/18/06  - Poss SNIFF for paradox on L 03/17/2014  - PFTs 04/04/14 MIP and MEP around 40% with min restrictive changes   cxr is unchanged but it is clear his gen muscle strength and inspiratory muscle function are deteriorating and he is intermittently having trouble swallowing liquids which is most likely also related to MD  He has already proven he can't tol cpap offered by Dr Maxwell Caul who is very good at working with issues of troubleshooting cpap and since has f/u planned at [facility] soon have asked that [facility] take over / assume care re any form of decision making re end of life vs more aggressive ventilator support up to and including trach/ vent in coordination with their pulmonary department so everyone is on the same page with this critically impt issue  Will need ST eval as well at some point which can be done here or at [facility] whichever the pt prefers.  Nothing else to offer in this clinic - f/u can be prn need for local acute care   Total time devoted to counseling  > 50 % of initial 60 min office visit:  review case with pt/wife who is very attentive discussion of options/alternatives/ personally creating written customized instructions  in presence of pt  then going over those specific  Instructions directly with the pt including how to use all of the meds but in particular covering each new medication in detail and the difference between the maintenance= "automatic" meds and the prns using an action plan format for the latter (If this problem/symptom => do that organization reading Left to right).  Please see AVS from this visit for a full list of these instructions which I personally wrote for this pt and  are unique to this visit.

## 2018-03-10 ENCOUNTER — Other Ambulatory Visit: Payer: BLUE CROSS/BLUE SHIELD

## 2018-03-17 ENCOUNTER — Other Ambulatory Visit: Payer: Self-pay | Admitting: Internal Medicine

## 2018-03-17 DIAGNOSIS — M545 Low back pain: Principal | ICD-10-CM

## 2018-03-17 DIAGNOSIS — G8929 Other chronic pain: Secondary | ICD-10-CM

## 2018-03-19 ENCOUNTER — Institutional Professional Consult (permissible substitution): Payer: BLUE CROSS/BLUE SHIELD | Admitting: Internal Medicine

## 2018-03-24 ENCOUNTER — Ambulatory Visit
Admission: RE | Admit: 2018-03-24 | Discharge: 2018-03-24 | Disposition: A | Discharge status: Home / Self Care | Payer: BLUE CROSS/BLUE SHIELD | Source: Ambulatory Visit | Attending: Internal Medicine | Admitting: Internal Medicine

## 2018-03-24 DIAGNOSIS — G8929 Other chronic pain: Secondary | ICD-10-CM

## 2018-03-24 DIAGNOSIS — M545 Low back pain: Principal | ICD-10-CM

## 2018-03-24 MED ORDER — METHYLPREDNISOLONE ACETATE 40 MG/ML INJ SUSP (RADIOLOG
120.0000 mg | Freq: Once | INTRAMUSCULAR | Status: AC
  Administered 2018-03-24: 120 mg via EPIDURAL

## 2018-03-24 MED ORDER — IOPAMIDOL (ISOVUE-M 200) INJECTION 41%
1.0000 mL | Freq: Once | INTRAMUSCULAR | Status: AC
  Administered 2018-03-24: 1 mL via EPIDURAL

## 2018-03-24 NOTE — Discharge Instructions (Signed)

## 2018-04-16 ENCOUNTER — Ambulatory Visit: Payer: BLUE CROSS/BLUE SHIELD | Admitting: Neurology

## 2018-04-16 ENCOUNTER — Encounter: Payer: Self-pay | Admitting: Neurology

## 2018-04-16 VITALS — BP 95/68 | HR 57 | Ht 71.0 in | Wt 171.0 lb

## 2018-04-16 DIAGNOSIS — R251 Tremor, unspecified: Secondary | ICD-10-CM | POA: Diagnosis not present

## 2018-04-16 DIAGNOSIS — G7102 Facioscapulohumeral muscular dystrophy: Secondary | ICD-10-CM

## 2018-04-16 NOTE — Progress Notes (Signed)
Reason for visit: [place] muscular dystrophy  Luis [name] is an 62 y.o. male  History of present illness:  Luis [name] is a 62 year old right-handed white male with a history of FSH muscular dystrophy.  The patient has had progressive weakness of all 4 extremities, his right arm in particular has become much more weak recently, he has difficulty lifting the arm up.  He has not reported any pain in the joints with movement of the arms.  He does have a resting tremor of the right hand, his wife has indicated previously that occasionally she may note a resting tremor of the right leg as well.  The patient is falling frequently, on a daily basis.  He uses a cane for ambulation.  He has a lift chair at home, he does not have a motorized wheelchair.  He has stopped work on 02 March 2018, he is on short-term disability.  He is applying for long-term disability but there is a clause in the disability policy that excludes any pre-existing conditions.  The patient is going to apply for Social Security disability.  The patient denies any significant problems with swallowing.  He is having a lot of shortness of breath with walking, this limits his ability to ambulate longer distances.  He has been seen by Dr. Melvyn Novas, the patient does have a left hemidiaphragm elevation.  The patient is being evaluated for a Trilogy system for his breathing.  The patient returns for an evaluation.  Past Medical History:  Diagnosis Date  . Bursitis, trochanteric    Episodic  . Carpal tunnel syndrome on right   . Degenerative disk disease    l5-S1  . FSH (facioscapulohumeral muscular dystrophy) ([facility]) 10/07/2017  . GERD (gastroesophageal reflux disease)   . Hemorrhoids    with anal fissures  . Left lateral epicondylitis   . MS (multiple sclerosis) ([facility])     Past Surgical History:  Procedure Laterality Date  . CHOLECYSTECTOMY  2007  . HEMORRHOIDECTOMY WITH HEMORRHOID BANDING      Family History  Problem Relation Age of Onset   . Allergies Brother   . Allergies Sister   . Heart disease Father   . Brain cancer Mother   . Bone cancer Brother     Social history:  reports that he has never smoked. He has never used smokeless tobacco. He reports that he does not drink alcohol or use drugs.   No Known Allergies  Medications:  Prior to Admission medications   Medication Sig Start Date End Date Taking? Authorizing Provider  Carboxymethylcellulose Sodium (REFRESH TEARS OP) Place 1 drop into both eyes 6 (six) times daily.   Yes [provider]  Docusate Calcium (STOOL SOFTENER PO) Take 2 tablets by mouth at bedtime.   Yes [provider]  doxycycline (VIBRA-TABS) 100 MG tablet Take 100 mg by mouth 2 (two) times daily.   Yes [provider]  famotidine (PEPCID) 20 MG tablet Take 20 mg by mouth at bedtime.   Yes [provider]  ibuprofen (ADVIL,MOTRIN) 800 MG tablet Take 800 mg by mouth 2 (two) times daily.   Yes [provider]  methocarbamol (ROBAXIN) 500 MG tablet Take 1,000 mg by mouth at bedtime as needed for muscle spasms.   Yes [provider]  naloxegol oxalate (MOVANTIK) 25 MG TABS tablet Take 25 mg by mouth daily.   Yes [provider]  oxyCODONE-acetaminophen (PERCOCET) 10-325 MG tablet Take 1 tablet by mouth every 6 (six) hours  as needed for pain.   Yes [provider]  prednisoLONE acetate (PRED FORTE) 1 % ophthalmic suspension INT 1 DROP INTO BOTH EYES TWICE DAILY 03/04/18  Yes [provider]  timolol (TIMOPTIC) 0.5 % ophthalmic solution Apply to eye. 11/20/17 11/20/18 Yes [provider]  zolpidem (AMBIEN) 10 MG tablet Take 10 mg by mouth at bedtime as needed for sleep.   Yes [provider]    ROS:  Out of a complete 14 system review of symptoms, the patient complains only of the following symptoms, and all other reviewed systems are negative.  Weakness Low back pain Walking difficulty  Blood pressure  95/68, pulse (!) 57, height 5\' 11"  (1.803 m), weight 171 lb (77.6 kg).  Physical Exam  General: The patient is alert and cooperative at the time of the examination.  Skin: No significant peripheral edema is noted.   Neurologic Exam  Mental status: The patient is alert and oriented x 3 at the time of the examination. The patient has apparent normal recent and remote memory, with an apparently normal attention span and concentration ability.   Cranial nerves: Facial symmetry is present. Speech is normal, no aphasia or dysarthria is noted. Extraocular movements are full. Visual fields are full.  Motor: The patient has 4-/5 strength with deltoid muscles bilaterally, 3/5 strength with biceps and triceps strength bilaterally, good grip strength.  The patient has 4+/5 strength with hip flexion, bilateral foot drops noted.  Good knee flexion and extension..  Sensory examination: Soft touch sensation is symmetric on the face, arms, and legs.  Coordination: The patient has good finger-nose-finger and heel-to-shin bilaterally.  A resting tremor in the right arm is noted occasionally.  Gait and station: The patient has significant difficulty arising from a seated position, a Gower sign is noted.  The patient is able to ambulate with a cane, he has a wide-based gait, bilateral steppage gait pattern.  Reflexes: Deep tendon reflexes are symmetric.   Assessment/Plan:  1. [place] muscular dystrophy  2.  Chronic gait disorder  3.  Resting tremor, right arm  The right arm resting tremor will be followed, we will need to look for developing signs of parkinsonism.  The patient is out of work now, he will be applying for disability.  The patient may benefit from a motorized wheelchair in the future mobility.  He is limited with his muscular weakness and with his breathing with walking.  We will get physical therapy out to the house to develop a muscle toning and strengthening exercise program.  He will  follow-up in 6 months.  Jill Alexanders MD 04/16/2018 8:29 AM  [facility] [address] [place], [place] [address]  Phone [phone] Fax [phone]

## 2018-04-20 ENCOUNTER — Telehealth: Payer: Self-pay | Admitting: Neurology

## 2018-04-20 NOTE — Telephone Encounter (Signed)
Pt mri reports and medical records @ the front desk for p/u

## 2018-04-20 NOTE — Telephone Encounter (Signed)
Pt wife(on DPR-[place],Sandra [phone]) is asking for a call re: office visit last week, please call

## 2018-04-20 NOTE — Telephone Encounter (Signed)
I called and spoke with wife. She is requesting medical records. Pt on STD now and applying for LTD. I placed her on hold to see if Angela Nevin available but she was not. I advised I will send her a phone note to call her back to address this request. She verbalized understanding and appreciation.

## 2018-04-21 ENCOUNTER — Telehealth: Payer: Self-pay | Admitting: Neurology

## 2018-04-21 NOTE — Telephone Encounter (Signed)
Luis [name](PT [facility]) @[phone] has called to inform that there is a delay in PT start date, it will begin on 04-24-2018 due to staff availablity.  No call back requested

## 2018-05-05 NOTE — Telephone Encounter (Signed)
Received VO from Dr. Krista Blue for pt to have PT 1x/week for 6 weeks. She is the WID this afternoon since Dr. Jannifer Franklin out today

## 2018-05-05 NOTE — Telephone Encounter (Signed)
Called, LVM for Erin giving VO from Dr. Krista Blue. Gave [facility] phone number if she has further questions.

## 2018-05-05 NOTE — Telephone Encounter (Signed)
Erin with [facility] calling to get a verbal order for PT 1 time a week for 6 weeks. The patient missed appointment last week. I advised Dr. Jannifer Franklin is out of the office and will send message to his nurse.

## 2018-05-14 ENCOUNTER — Telehealth: Payer: Self-pay | Admitting: *Deleted

## 2018-05-14 NOTE — Telephone Encounter (Signed)
Faxed signed orders to [facility] re: PT POC. Fax: [phone]. Received fax confirmation.

## 2018-06-09 ENCOUNTER — Telehealth: Payer: Self-pay | Admitting: Neurology

## 2018-06-09 DIAGNOSIS — R269 Unspecified abnormalities of gait and mobility: Secondary | ICD-10-CM

## 2018-06-09 DIAGNOSIS — G7102 Facioscapulohumeral muscular dystrophy: Secondary | ICD-10-CM

## 2018-06-09 NOTE — Telephone Encounter (Signed)
Erin with [facility] requesting a verbal order for PT 1x a week for 2 weeks. She states the patient reported a fall last Friday and has bruising on left arm and left side but otherwise he is alright. Today he had a missed visit because patient cancelled due to another appointment.

## 2018-06-09 NOTE — Telephone Encounter (Signed)
Spoke with Junie Panning and gave v/o for PT once weekly for 2 wks/fim

## 2018-06-16 NOTE — Telephone Encounter (Signed)
Faxed printed/signed prescription to [facility] at [phone]. Received fax confirmation.

## 2018-06-16 NOTE — Addendum Note (Signed)
Addended by: Kathrynn Ducking on: 06/16/2018 11:23 AM   Modules accepted: Orders

## 2018-06-16 NOTE — Telephone Encounter (Signed)
I will write a prescription for motorized wheelchair for this patient.

## 2018-06-16 NOTE — Telephone Encounter (Signed)
Luis [name] with [facility] [phone] needing an order for an electric wheelchair sent to [facility] at [place] (f) [phone]

## 2018-06-17 ENCOUNTER — Telehealth: Payer: Self-pay | Admitting: *Deleted

## 2018-06-17 NOTE — Telephone Encounter (Signed)
Faxed signed orders to [facility] re: "Pt requesting ongoing skilled home PT to end of certification period to allow for make-up missed visits. Requesting 1W2 effective week 06/14/18 to address current PT interventions and goals". Fax: [phone]. Received fax confirmation.

## 2018-06-22 IMAGING — CT CT HEAD W/O CM
1 series · 16 of 30 positions shown, 20 images · non-contrast
Comparison: CT HEAD report dated August 06, 2002 though images are
not available for direct comparison.

CLINICAL DATA: RIGHT-sided tremor for 2 months.  Assess for stroke.

EXAM:
CT HEAD WITHOUT CONTRAST
TECHNIQUE: Contiguous axial images were obtained from the base of the skull
through the vertex without intravenous contrast.

[Series 2: head w/(date) · axial · 0.45mm/px · z∈[-184,-39]mm · 16 of 33 slices shown, 20 images]
[im 2/33  brain]
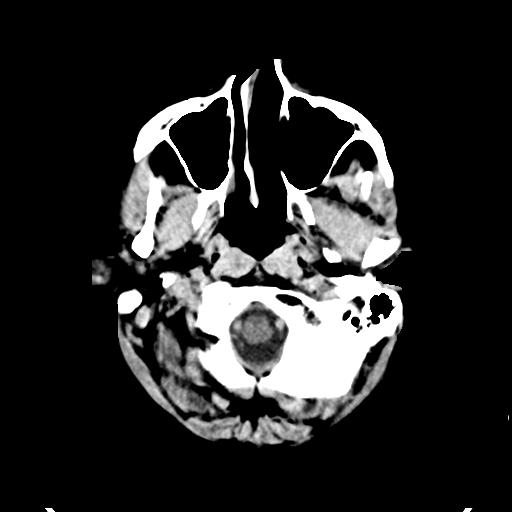
[im 2/33  bone]
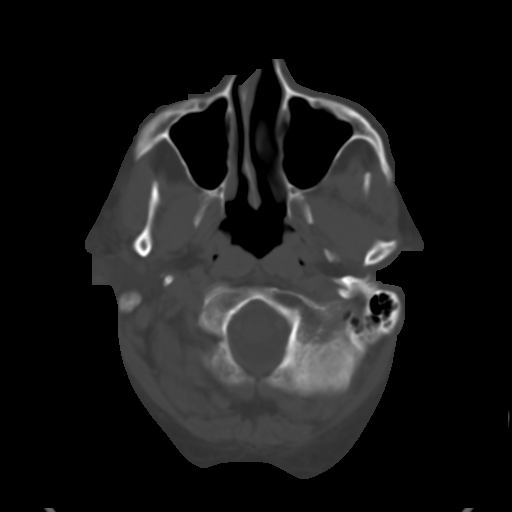
[im 4/33  brain]
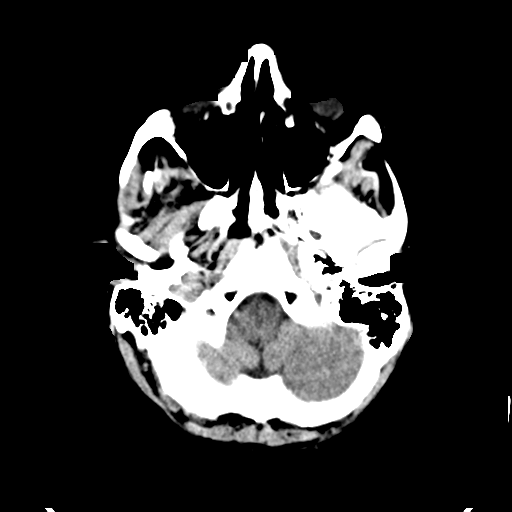
[im 6/33  brain]
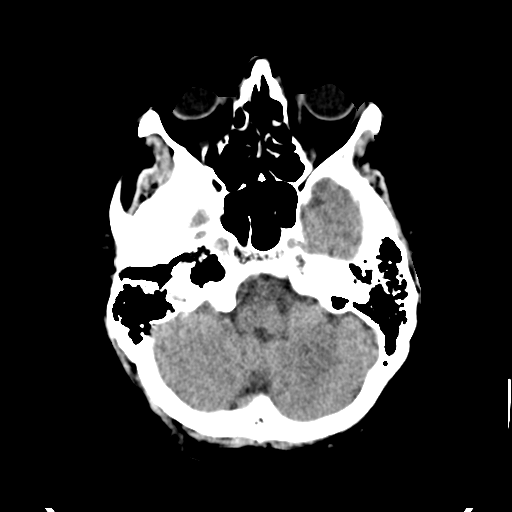
[im 8/33  brain]
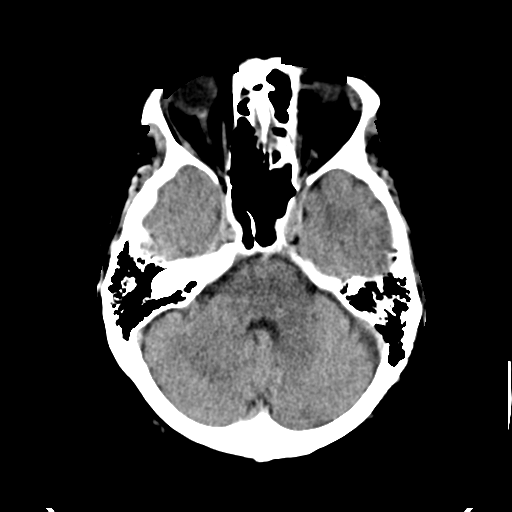
[im 9/33  brain]
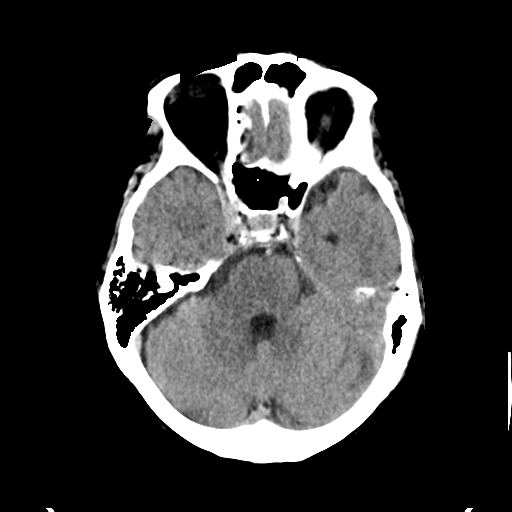
[im 9/33  bone]
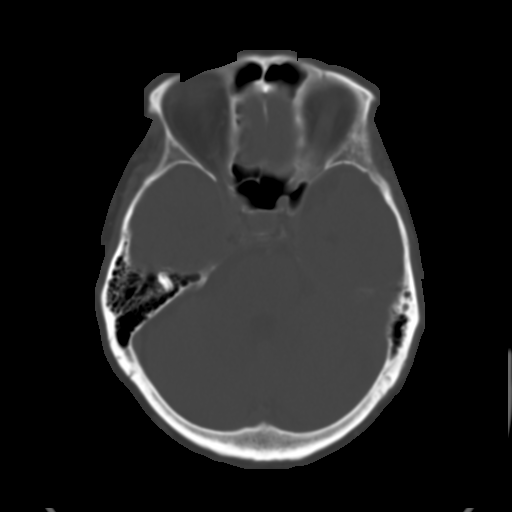
[im 12/33  brain]
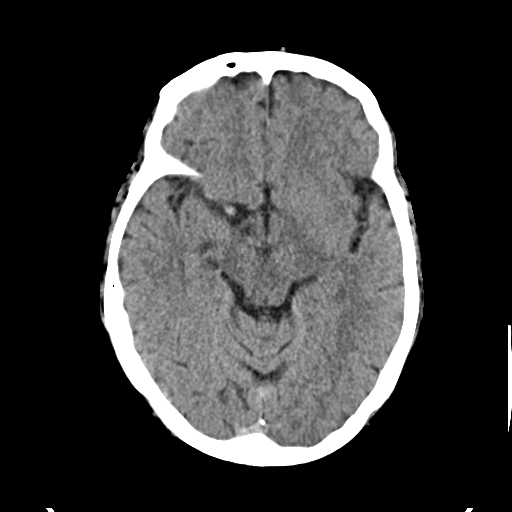
[im 14/33  brain]
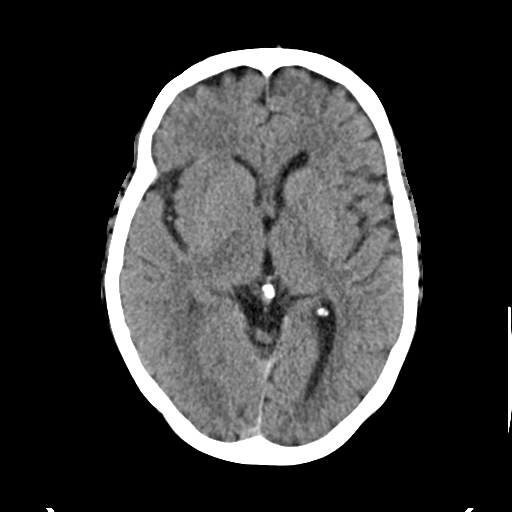
[im 16/33  brain]
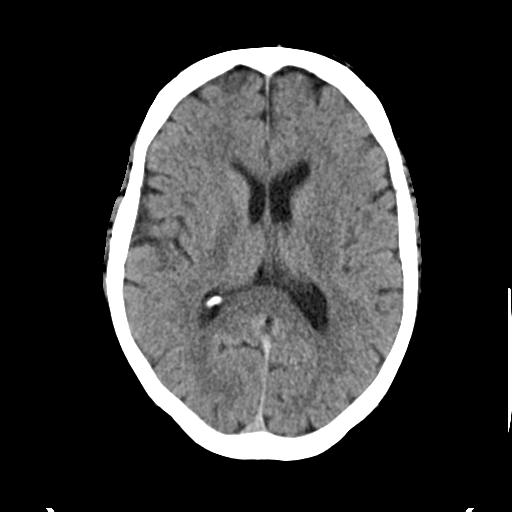
[im 17/33  brain]
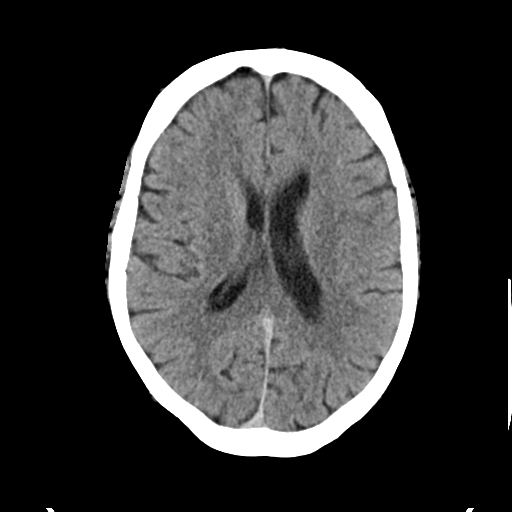
[im 17/33  bone]
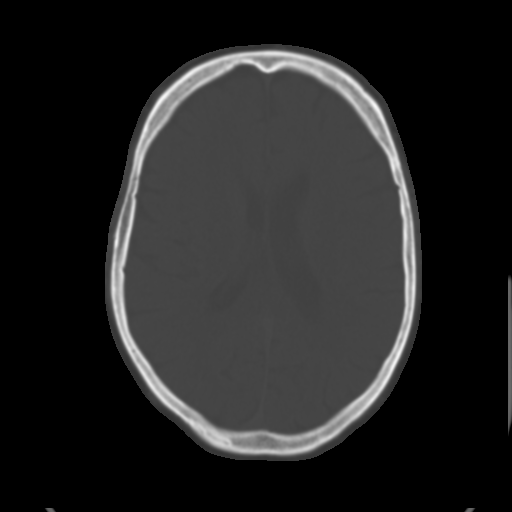
[im 19/33  brain]
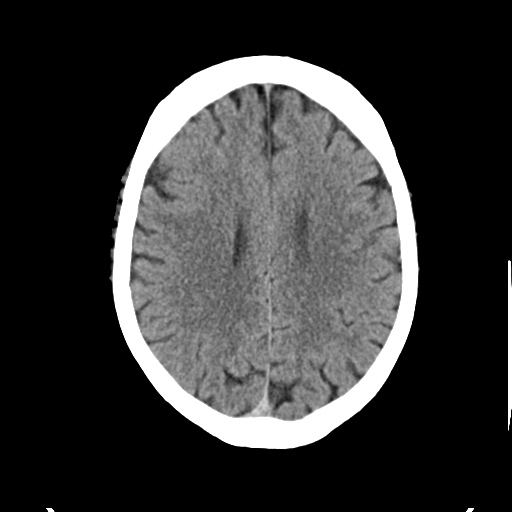
[im 21/33  brain]
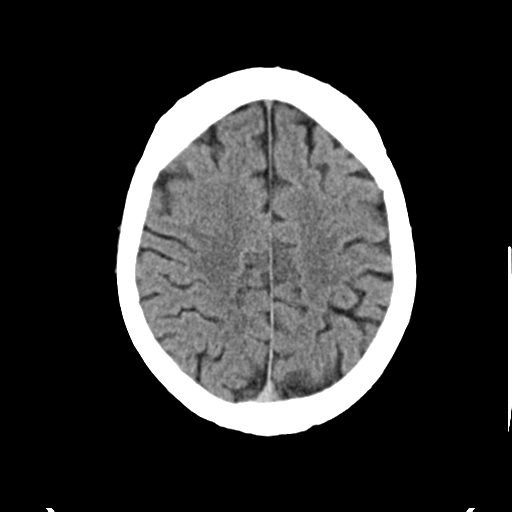
[im 24/33  brain]
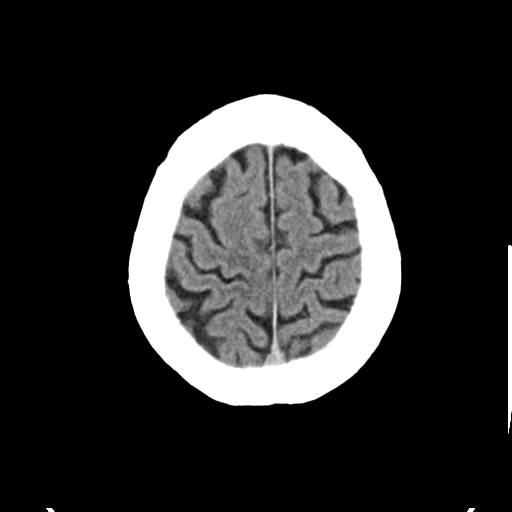
[im 25/33  brain]
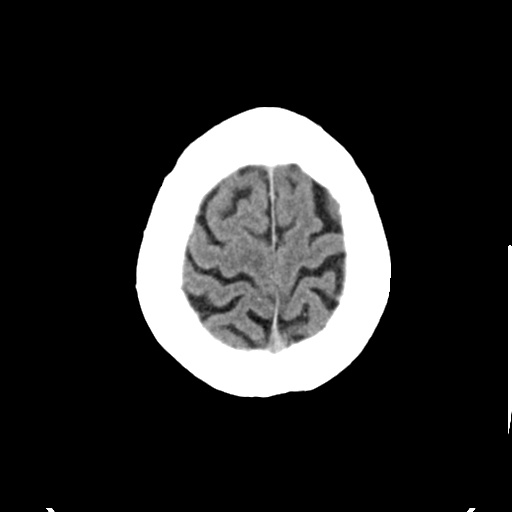
[im 25/33  bone]
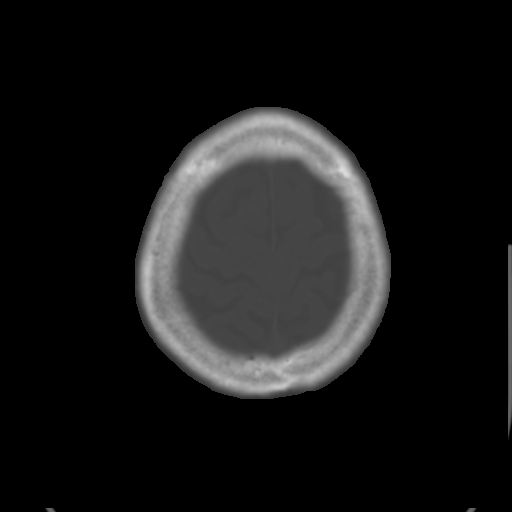
[im 27/33  brain]
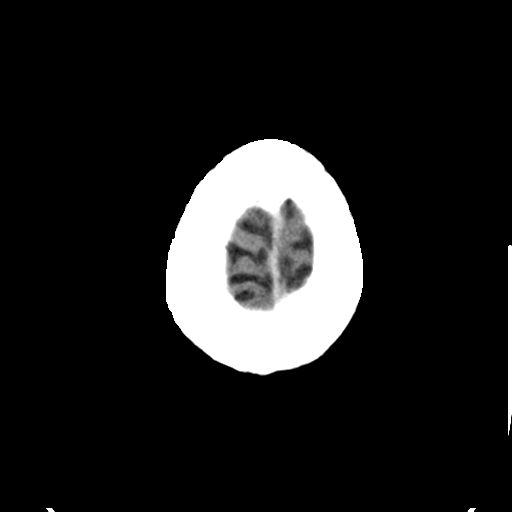
[im 29/33  brain]
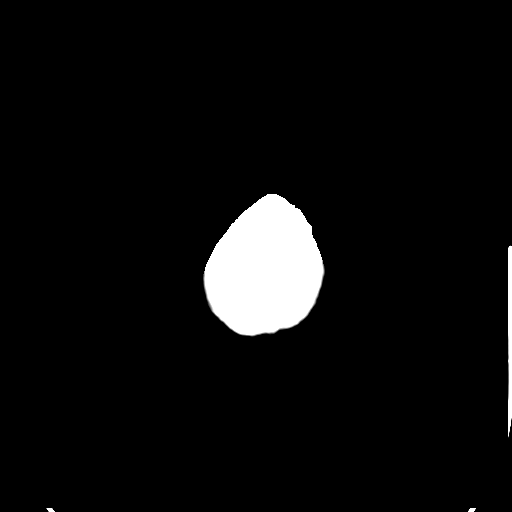
[im 31/33  brain]
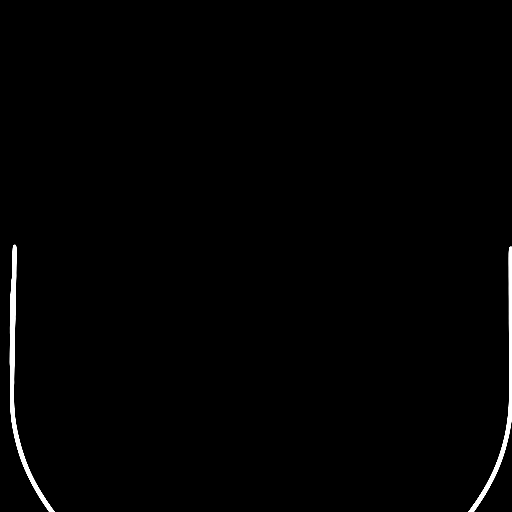

[16 of 30 positions shown; findings below may reference images not displayed]

FINDINGS: BRAIN: No intraparenchymal hemorrhage, mass effect nor midline
shift. The ventricles and sulci are normal. No acute large vascular
territory infarcts. No abnormal extra-axial fluid collections. Basal
cisterns are patent.

VASCULAR: Trace calcific atherosclerosis carotid siphons.

SKULL/SOFT TISSUES: No skull fracture. No significant soft tissue
swelling.

ORBITS/SINUSES: The included ocular globes and orbital contents are
normal.Trace ethmoid mucosal thickening. Mastoid air cells are well
aerated.

OTHER: None.
IMPRESSION: Negative noncontrast CT HEAD for age.

## 2018-10-19 ENCOUNTER — Other Ambulatory Visit: Payer: Self-pay

## 2018-10-19 ENCOUNTER — Ambulatory Visit: Payer: BLUE CROSS/BLUE SHIELD | Admitting: Neurology

## 2018-10-19 ENCOUNTER — Encounter: Payer: Self-pay | Admitting: Neurology

## 2018-10-19 VITALS — BP 119/79 | HR 65 | Resp 18 | Ht 71.0 in | Wt 160.0 lb

## 2018-10-19 DIAGNOSIS — G20 Parkinson's disease: Secondary | ICD-10-CM

## 2018-10-19 DIAGNOSIS — G7102 Facioscapulohumeral muscular dystrophy: Secondary | ICD-10-CM | POA: Diagnosis not present

## 2018-10-19 DIAGNOSIS — R269 Unspecified abnormalities of gait and mobility: Secondary | ICD-10-CM | POA: Diagnosis not present

## 2018-10-19 DIAGNOSIS — M21371 Foot drop, right foot: Secondary | ICD-10-CM

## 2018-10-19 DIAGNOSIS — G20A1 Parkinson's disease without dyskinesia, without mention of fluctuations: Secondary | ICD-10-CM

## 2018-10-19 DIAGNOSIS — M21372 Foot drop, left foot: Secondary | ICD-10-CM | POA: Insufficient documentation

## 2018-10-19 HISTORY — DX: Unspecified abnormalities of gait and mobility: R26.9

## 2018-10-19 HISTORY — DX: Foot drop, right foot: M21.372

## 2018-10-19 HISTORY — DX: Parkinson's disease: G20

## 2018-10-19 HISTORY — DX: Parkinson's disease without dyskinesia, without mention of fluctuations: G20.A1

## 2018-10-19 HISTORY — DX: Foot drop, right foot: M21.371

## 2018-10-19 MED ORDER — CARBIDOPA-LEVODOPA 25-100 MG PO TABS: ORAL_TABLET | ORAL | 3 refills | Status: DC

## 2018-10-19 NOTE — Progress Notes (Signed)
Reason for visit: [place] dystrophy, Parkinson's disease, gait disorder  Luis [name] is an 63 y.o. male  History of present illness:  Luis [name] is a 63 year old right-handed white male with a history of FSH muscular dystrophy.  He is now on disability for this issue, he will be applying for Social Security disability in the near future.  The patient walks with a cane, he is trying to get a walker, a prescription has also been given previously for a motorized wheelchair.  The patient continues to fall on a regular basis, he has chronic low back pain.  He has weakness of both arms, right greater than left.  More recently, he has developed tremors that involve the right arm and right leg, he is no longer able to perform handwriting with the right arm and hand.  The patient uses his left arm and hand to feed himself.  The patient is still able to bathe himself and dress himself.  He has been seen by Dr. Tillman Abide for neuromuscular evaluation recently.  The patient has AFO braces but he does not use them as they are uncomfortable.  He has had these braces for 10 to 15 years.  The patient reports no problems with swallowing or choking.  He does have a left hemidiaphragm paralysis, he is on a Trilogy machine at night.  Recently, he has developed significant blurred vision, he has evidence of glaucoma, he will be evaluated for this in the near future.  Past Medical History:  Diagnosis Date  . Bilateral foot-drop 10/19/2018  . Bursitis, trochanteric    Episodic  . Carpal tunnel syndrome on right   . Degenerative disk disease    l5-S1  . FSH (facioscapulohumeral muscular dystrophy) ([facility]) 10/07/2017  . Gait abnormality 10/19/2018  . GERD (gastroesophageal reflux disease)   . Hemorrhoids    with anal fissures  . Left lateral epicondylitis   . MS (multiple sclerosis) ([facility])   . Parkinson's disease ([facility]) 10/19/2018    Past Surgical History:  Procedure Laterality Date  . CHOLECYSTECTOMY  2007  .  HEMORRHOIDECTOMY WITH HEMORRHOID BANDING      Family History  Problem Relation Age of Onset  . Allergies Brother   . Allergies Sister   . Heart disease Father   . Brain cancer Mother   . Bone cancer Brother     Social history:  reports that he has never smoked. He has never used smokeless tobacco. He reports that he does not drink alcohol or use drugs.   No Known Allergies  Medications:  Prior to Admission medications   Medication Sig Start Date End Date Taking? Authorizing Provider  Carboxymethylcellulose Sodium (REFRESH TEARS OP) Place 1 drop into both eyes 6 (six) times daily.   Yes [provider]  Docusate Calcium (STOOL SOFTENER PO) Take 2 tablets by mouth at bedtime.   Yes [provider]  doxycycline (VIBRA-TABS) 100 MG tablet Take 100 mg by mouth 2 (two) times daily.   Yes [provider]  ibuprofen (ADVIL,MOTRIN) 800 MG tablet Take 800 mg by mouth 2 (two) times daily.   Yes [provider]  methocarbamol (ROBAXIN) 500 MG tablet Take 1,000 mg by mouth at bedtime as needed for muscle spasms.   Yes [provider]  naloxegol oxalate (MOVANTIK) 25 MG TABS tablet Take 25 mg by mouth daily.   Yes [provider]  oxyCODONE-acetaminophen (PERCOCET) 10-325 MG tablet Take 1 tablet by mouth every 6 (six) hours as needed for  pain.   Yes [provider]  prednisoLONE acetate (PRED FORTE) 1 % ophthalmic suspension INT 1 DROP INTO BOTH EYES TWICE DAILY 03/04/18  Yes [provider]  timolol (TIMOPTIC) 0.5 % ophthalmic solution Apply to eye. 11/20/17 11/20/18 Yes [provider]  zolpidem (AMBIEN) 10 MG tablet Take 10 mg by mouth at bedtime as needed for sleep.   Yes [provider]  carbidopa-levodopa (SINEMET IR) 25-100 MG tablet 1/2 tablet three times a day for 4 weeks, then take one tablet three times a day 10/19/18   Kathrynn Ducking, MD  famotidine (PEPCID) 20 MG tablet Take 20 mg by mouth at bedtime.     [provider]    ROS:  Out of a complete 14 system review of symptoms, the patient complains only of the following symptoms, and all other reviewed systems are negative.  Tremor Weakness Gait disturbance, falls  Blood pressure 119/79, pulse 65, resp. rate 18, height 5\' 11"  (1.803 m), weight 160 lb (72.6 kg).  Physical Exam  General: The patient is alert and cooperative at the time of the examination.  Skin: No significant peripheral edema is noted.   Neurologic Exam  Mental status: The patient is alert and oriented x 3 at the time of the examination. The patient has apparent normal recent and remote memory, with an apparently normal attention span and concentration ability.   Cranial nerves: Facial symmetry is present. Speech is normal, no aphasia or dysarthria is noted. Extraocular movements are full. Visual fields are full.  Motor: The patient has 4/5 strength with intrinsic muscles of the hands bilaterally, he has 2/5 strength with biceps and triceps on the right, 3/5 on the left, 4/5 deltoid muscles bilaterally.  With the lower extremities, the patient has 2/5 strength with adductor muscles of the thighs, 4/5 with abductor muscles of the thighs, 4-/5 strength with hip flexor bilaterally, prominent bilateral foot drops, 4/5 strength with knee extension bilaterally.  Sensory examination: Soft touch sensation is symmetric on the face, arms, and legs.  Coordination: The patient has difficulty performing finger-nose-finger and heel shin bilaterally.  Resting tremors are noted on the right arm and right leg.  Gait and station: The patient has a wide-based gait, decreased arm swing with the right arm, the patient has a bilateral steppage gait pattern, unsteady gait.  Reflexes: Deep tendon reflexes are symmetric.   Assessment/Plan:  1. [place] muscular dystrophy  2.  Right arm and right leg tremors, Parkinson's disease  3.  Gait disturbance, multiple falls  4.   Bilateral foot drops  5.  Chronic low back pain  The patient currently is on disability.  He needs to be using AFO braces bilaterally, he cannot use his current AFO braces as they are uncomfortable and they rub on the skin of the ankles and feet.  Another prescription for an AFO brace will be given, the patient will be getting a walker soon.  The patient will be placed on Sinemet taking the 25/100 mg tablets taking 1/2 tablet 3 times a day for 4 weeks and then go to 1 full tablet 3 times daily.  Artane and Cogentin will not be used at this time given the fact that the patient is developing glaucoma and has severe blurred vision.  He will follow-up in 5 months.  Jill Alexanders MD 10/19/2018 8:22 AM  [facility] [address] [place], [place] [address]  Phone [phone] Fax [phone]

## 2018-10-19 NOTE — Patient Instructions (Signed)
We will start Sinemet for parkinson's disease.  Sinemet (carbidopa) may result in confusion or hallucinations, drowsiness, nausea, or dizziness. If any significant side effects are noted, please contact our office. Sinemet may not be well absorbed when taken with high protein meals, if tolerated it is best to take 30-45 minutes before you eat.  

## 2018-12-23 DIAGNOSIS — Z0289 Encounter for other administrative examinations: Secondary | ICD-10-CM

## 2019-03-04 ENCOUNTER — Other Ambulatory Visit: Payer: Self-pay | Admitting: Internal Medicine

## 2019-03-04 ENCOUNTER — Ambulatory Visit
Admission: RE | Admit: 2019-03-04 | Discharge: 2019-03-04 | Disposition: A | Discharge status: Home / Self Care | Payer: BC Managed Care – PPO | Source: Ambulatory Visit | Attending: Internal Medicine | Admitting: Internal Medicine

## 2019-03-04 DIAGNOSIS — R0789 Other chest pain: Secondary | ICD-10-CM

## 2019-03-11 ENCOUNTER — Other Ambulatory Visit: Payer: Self-pay | Admitting: Internal Medicine

## 2019-03-11 DIAGNOSIS — R1011 Right upper quadrant pain: Secondary | ICD-10-CM

## 2019-03-15 ENCOUNTER — Ambulatory Visit
Admission: RE | Admit: 2019-03-15 | Discharge: 2019-03-15 | Disposition: A | Discharge status: Home / Self Care | Payer: BC Managed Care – PPO | Source: Ambulatory Visit | Attending: Internal Medicine | Admitting: Internal Medicine

## 2019-03-15 DIAGNOSIS — R1011 Right upper quadrant pain: Secondary | ICD-10-CM

## 2019-03-15 MED ORDER — IOPAMIDOL (ISOVUE-300) INJECTION 61%
100.0000 mL | Freq: Once | INTRAVENOUS | Status: AC | PRN
  Administered 2019-03-15: 100 mL via INTRAVENOUS

## 2019-03-16 ENCOUNTER — Encounter: Payer: Self-pay | Admitting: Neurology

## 2019-03-16 ENCOUNTER — Other Ambulatory Visit: Payer: Self-pay

## 2019-03-16 ENCOUNTER — Ambulatory Visit (INDEPENDENT_AMBULATORY_CARE_PROVIDER_SITE_OTHER): Payer: BC Managed Care – PPO | Admitting: Neurology

## 2019-03-16 VITALS — BP 115/80 | HR 60 | Temp 98.4°F | Ht 71.0 in | Wt 154.0 lb

## 2019-03-16 DIAGNOSIS — G20 Parkinson's disease: Secondary | ICD-10-CM

## 2019-03-16 DIAGNOSIS — R269 Unspecified abnormalities of gait and mobility: Secondary | ICD-10-CM | POA: Diagnosis not present

## 2019-03-16 DIAGNOSIS — G7102 Facioscapulohumeral muscular dystrophy: Secondary | ICD-10-CM

## 2019-03-16 DIAGNOSIS — G20A1 Parkinson's disease without dyskinesia, without mention of fluctuations: Secondary | ICD-10-CM

## 2019-03-16 NOTE — Progress Notes (Signed)
Reason for visit: [place] dystrophy, Parkinson's disease  Luis [name] is an 63 y.o. male  History of present illness:  Luis [name] is a 63 year old right-handed white male with a history of FSH muscular dystrophy and a left hemidiaphragm paralysis.  The patient has had progressive weakness of the arms and legs, he has difficulty feeding himself because of this.  The left arm is slightly stronger than the right.  He has developed tremors involving the right arm and right leg and decreased arm swing with the right arm with walking, he is felt to have Parkinson's disease.  He has not yet started the Sinemet prescription.  He has undergone cataract surgery bilaterally which is helped his vision.  He continues to fall regularly, the last fall was about 2 weeks ago.  The patient has AFO braces but he has not yet worn them, he is doing some muscle toning exercises regularly.  The patient has had some painless swelling of the right ankle.  He has had some issues with constipation and right flank pain, he recently had an abdominal CT scan that was relatively unremarkable.  He does have chronic constipation issues.  Past Medical History:  Diagnosis Date  . Bilateral foot-drop 10/19/2018  . Bursitis, trochanteric    Episodic  . Carpal tunnel syndrome on right   . Degenerative disk disease    l5-S1  . FSH (facioscapulohumeral muscular dystrophy) ([facility]) 10/07/2017  . Gait abnormality 10/19/2018  . GERD (gastroesophageal reflux disease)   . Hemorrhoids    with anal fissures  . Left lateral epicondylitis   . MS (multiple sclerosis) ([facility])   . Parkinson's disease ([place]) 10/19/2018    Past Surgical History:  Procedure Laterality Date  . CHOLECYSTECTOMY  2007  . HEMORRHOIDECTOMY WITH HEMORRHOID BANDING      Family History  Problem Relation Age of Onset  . Allergies Brother   . Allergies Sister   . Heart disease Father   . Brain cancer Mother   . Bone cancer Brother     Social history:  reports that  he has never smoked. He has never used smokeless tobacco. He reports that he does not drink alcohol or use drugs.   No Known Allergies  Medications:  Prior to Admission medications   Medication Sig Start Date End Date Taking? Authorizing Provider  carbidopa-levodopa (SINEMET IR) 25-100 MG tablet 1/2 tablet three times a day for 4 weeks, then take one tablet three times a day 10/19/18  Yes Kathrynn Ducking, MD  Carboxymethylcellulose Sodium (REFRESH TEARS OP) Place 1 drop into both eyes 6 (six) times daily.   Yes [provider]  Docusate Calcium (STOOL SOFTENER PO) Take 2 tablets by mouth at bedtime.   Yes [provider]  doxycycline (VIBRA-TABS) 100 MG tablet Take 100 mg by mouth 2 (two) times daily.   Yes [provider]  ibuprofen (ADVIL,MOTRIN) 800 MG tablet Take 800 mg by mouth 2 (two) times daily.   Yes [provider]  methocarbamol (ROBAXIN) 500 MG tablet Take 1,000 mg by mouth at bedtime as needed for muscle spasms.   Yes [provider]  naloxegol oxalate (MOVANTIK) 25 MG TABS tablet Take 25 mg by mouth as needed.    Yes [provider]  oxyCODONE-acetaminophen (PERCOCET) 10-325 MG tablet Take 1 tablet by mouth every 6 (six) hours as needed for pain.   Yes [provider]  prednisoLONE acetate (PRED FORTE) 1 % ophthalmic suspension INT 1 DROP INTO BOTH  EYES TWICE DAILY 03/04/18  Yes [provider]  timolol (BETIMOL) 0.25 % ophthalmic solution 1-2 drops 2 (two) times daily.   Yes [provider]  zolpidem (AMBIEN) 10 MG tablet Take 10 mg by mouth at bedtime as needed for sleep.   Yes [provider]  famotidine (PEPCID) 20 MG tablet Take 20 mg by mouth at bedtime.    [provider]    ROS:  Out of a complete 14 system review of symptoms, the patient complains only of the following symptoms, and all other reviewed systems are negative.  Muscle weakness Right ankle swelling Walking  problems Chronic constipation  Blood pressure 115/80, pulse 60, temperature 98.4 F (36.9 C), temperature source Temporal, height 5\' 11"  (1.803 m), weight 154 lb (69.9 kg).  Physical Exam  General: The patient is alert and cooperative at the time of the examination.  Skin: No significant peripheral edema is noted.   Neurologic Exam  Mental status: The patient is alert and oriented x 3 at the time of the examination. The patient has apparent normal recent and remote memory, with an apparently normal attention span and concentration ability.   Cranial nerves: Facial symmetry is present. Speech is normal, no aphasia or dysarthria is noted. Extraocular movements are full. Visual fields are full.  Slight masking of the face is seen.  Motor: The patient has good strength with intrinsic muscles of the hands, he has 2/5 strength with biceps muscles bilaterally, 2/5 strength with the right triceps muscle and 3/5 with the left triceps muscle, 4/5 strength with deltoid muscles bilaterally.  With the lower extremities, there is 2/5 strength with adductors and hamstring muscles bilaterally, 4/5 strength with hip flexors bilaterally and with knee extensors.  Significant bilateral foot drops were seen.  Sensory examination: Soft touch sensation is symmetric on the face, arms, and legs.  Coordination: The patient has difficulty performing finger-nose-finger and heel shin bilaterally.  Resting tremors noted in the right greater than left upper extremities, and to the right leg.  Gait and station: The patient has a wide-based gait, unsteady.  The patient uses a walking stick to ambulate.  Tandem gait was not attempted.  Some decreased arm swing is seen on the right.  Reflexes: Deep tendon reflexes are symmetric, but are depressed.   Assessment/Plan:  1. [place] muscular dystrophy  2.  Parkinson's disease  3.  Gait disorder  I will set the patient up for physical therapy to work with the patient in  using his AFO braces for ambulation.  He seems to be somewhat hesitant to use the braces.  I believe this should help him with gait stability.  The patient has not yet pursued getting a motorized wheelchair.  He will start his Sinemet, I will follow-up in 5 or 6 months.  Greater than 50% of the visit was spent in counseling and coordination of care.  Face-to-face time with the patient was 25 minutes.   Jill Alexanders MD 03/16/2019 4:06 PM  [facility] [address] [place] [address] [place], [place] [address]  Phone [phone] Fax [phone]

## 2019-03-19 ENCOUNTER — Ambulatory Visit: Payer: BLUE CROSS/BLUE SHIELD | Admitting: Neurology

## 2019-04-01 ENCOUNTER — Ambulatory Visit: Payer: BC Managed Care – PPO | Admitting: Physical Therapy

## 2019-04-07 ENCOUNTER — Other Ambulatory Visit: Payer: Self-pay

## 2019-04-07 ENCOUNTER — Ambulatory Visit: Payer: BC Managed Care – PPO | Attending: Neurology | Admitting: Physical Therapy

## 2019-04-07 DIAGNOSIS — R2689 Other abnormalities of gait and mobility: Secondary | ICD-10-CM

## 2019-04-07 DIAGNOSIS — R293 Abnormal posture: Secondary | ICD-10-CM | POA: Diagnosis present

## 2019-04-07 DIAGNOSIS — R2681 Unsteadiness on feet: Secondary | ICD-10-CM | POA: Insufficient documentation

## 2019-04-07 DIAGNOSIS — M6281 Muscle weakness (generalized): Secondary | ICD-10-CM | POA: Diagnosis present

## 2019-04-07 NOTE — Patient Instructions (Signed)
Parkinson's Aware in Care Kit   1-800-4PD INFO  [email or web address]

## 2019-04-07 NOTE — Therapy (Signed)
[facility] [address] [place], [place], [address] Phone: [phone]   Fax:  [phone]  Physical Therapy Evaluation  Patient Details  Name: Luis [name] MRN: [id number] Date of Birth: 04-Apr-1956 Referring Provider (PT): Margette Fast   Encounter Date: 04/07/2019  CLINIC OPERATION CHANGES: Outpatient Neuro Rehab is open at lower capacity following universal masking, social distancing, and patient screening.  The patient's COVID risk of complications score is 2.   PT End of Session - 04/07/19 1917    Visit Number  1    Number of Visits  18    Date for PT Re-Evaluation  07/06/19    Authorization Type  BCBS-30 visit limit (combined with OT, chiro, in-home and oppt)-H[employer]    Authorization - Visit Number  1    Authorization - Number of Visits  30    PT Start Time  0801    PT Stop Time  0856    PT Time Calculation (min)  55 min    Equipment Utilized During Treatment  Gait belt    Activity Tolerance  Patient tolerated treatment well    Behavior During Therapy  WFL for tasks assessed/performed       Past Medical History:  Diagnosis Date  . Bilateral foot-drop 10/19/2018  . Bursitis, trochanteric    Episodic  . Carpal tunnel syndrome on right   . Degenerative disk disease    l5-S1  . FSH (facioscapulohumeral muscular dystrophy) ([place]) 10/07/2017  . Gait abnormality 10/19/2018  . GERD (gastroesophageal reflux disease)   . Hemorrhoids    with anal fissures  . Left lateral epicondylitis   . MS (multiple sclerosis) ([place])   . Parkinson's disease ([place]) 10/19/2018    Past Surgical History:  Procedure Laterality Date  . CHOLECYSTECTOMY  2007  . HEMORRHOIDECTOMY WITH HEMORRHOID BANDING      There were no vitals filed for this visit.   Subjective Assessment - 04/07/19 0807    Subjective  Wife provides background history and pt adds in:  Pt has hx of [place] (facioscapulohumeral) Muscular dystrophy 30 years ago and pt  is followed by neurologist at [facility] for this.  Has had tremors in UEs > 1 year ; in past 18 months, he has noted increased weakness and falls.  Pt very weak in lower extremities and is having swelling in bilateral feet.  Bilateral AFOs (from [employer] in [place]) received in June, but has not worn.  Uses walking stick for gait    Patient is accompained by:  Family member   wife, [name]   Pertinent History  [place] muscular dystrophy and L hemidiaphragm paralysis, bilateral foot drop, CTS RUE, GERD, chronic constipation, bilateral cataract surgery; PD dx within past year by Dr. Jannifer Franklin    Limitations  Sitting;Walking    Patient Stated Goals  Pt's goal is to be more mobile, move easier and be functional.  Per wife, get better understanding of how braces work.    Currently in Pain?  Yes    Pain Score  9    At best-7/10   Pain Location  Back    Pain Orientation  Right;Left;Posterior;Lower    Pain Descriptors / Indicators  Aching    Pain Type  Chronic pain   Muscular-dystrophy related   Pain Onset  More than a month ago    Pain Frequency  Constant    Aggravating Factors   walking    Pain Relieving Factors  medication  [facility] PT Assessment - 04/07/19 0817      Assessment   Medical Diagnosis  Parkinson's disease, also hx of Muscular dystrophy    Referring Provider (PT)  Margette Fast    Onset Date/Surgical Date  03/16/19   Dr. Tobey Grim visit     Precautions   Precautions  Fall   breathing issues due to paralyzed diaphragm    Precaution Comments  Pt with significant weakness through shoulder girdle, requires wide BOS and increased momentum with assist at L trunk and L shoulder for sit to stand      Balance Screen   Has the patient fallen in the past 6 months  Yes    How many times?  20-30    Has the patient had a decrease in activity level because of a fear of falling?   Yes    Is the patient reluctant to leave their home because of a fear of falling?   No      Home Environment    Living Environment  Private residence    Living Arrangements  Spouse/significant other    Available Help at Discharge  Family    Type of [place] Access  Level entry    [place]  One level      Prior Function   Level of [place] for independence    Vocation  Retired;On disability    Vocation Requirements  Retired Freight forwarder at [employer]      Observation/Other Assessments   Observations  Edema noted R lower leg     Focus on Therapeutic Outcomes (FOTO)   NA      Posture/Postural Control   Posture/Postural Control  Postural limitations    Postural Limitations  Rounded Shoulders;Posterior pelvic tilt      ROM / Strength   AROM / PROM / Strength  Strength      Strength   Overall Strength  Deficits    Strength Assessment Site  Hip;Knee;Ankle    Right/Left Hip  Right;Left    Right Hip Flexion  3-/5    Left Hip Flexion  3-/5    Right/Left Knee  Right;Left    Right Knee Flexion  2/5    Right Knee Extension  4/5    Left Knee Flexion  2/5    Left Knee Extension  4/5    Right/Left Ankle  Right;Left    Right Ankle Dorsiflexion  1/5    Left Ankle Dorsiflexion  1/5      Transfers   Transfers  Sit to Stand;Stand to Sit    Sit to Stand  2: Max assist;With upper extremity assist;From chair/3-in-1    Sit to Stand Details (indicate cue type and reason)  Pt requires wide BOS and leans quickly and with increased momentum forward, as wife assists from L side, at L shoulder.  Wife and pt provide cues with how pt is able to transfer.    Stand to Sit  4: Min guard;Without upper extremity assist;To chair/3-in-1;Uncontrolled descent      Ambulation/Gait   Ambulation/Gait  Yes    Ambulation/Gait Assistance  5: Supervision    Ambulation/Gait Assistance Details  PT observed pt walking into therapy Room 1 from lobby, approx 35 ft.  Pt ambulates with widened BOS, bilateral foot drop, slowed pace.    Ambulation Distance (Feet)  35 Feet   additional  10 ft x 3 reps while wearing bilateral AFOs   Assistive device  Other (  Comment)   single walking pole   Gait Pattern  Step-to pattern;Step-through pattern;Decreased arm swing - right;Decreased step length - right;Decreased step length - left;Decreased dorsiflexion - right;Decreased dorsiflexion - left;Right steppage;Left steppage;Wide base of support   Holds walking pole in LUE   Ambulation Surface  Level;Indoor    Gait Comments  Assisted patient in donning and doffing bilateral AFOs, as pt has not yet worn or ambulated in them.  Pt able to ambulate with single walking pole 10 ft x 3  reps in treatment room, with improved foot clearance, decreased foot drop, decreased steppage gait, with min assist.      High Level Balance   High Level Balance Comments  Pt requires UE support of walking pole for standing.  For prolonged standing, pt leans against counter for support.                Objective measurements completed on examination: See above findings.           Self Care:   PT Education - 04/07/19 1916    Education Details  Benefits of using AFOs, initial instruction in donning/doffing AFOs; gait instruction with AFOs; discussed pt's Sinemet medication addition-pt stopped taking and PT advised pt to contact MD regarding this including the side effects he experienced; Aware in Care kit    Person(s) Educated  Patient;Spouse    Methods  Explanation;Demonstration;Handout    Comprehension  Verbalized understanding       PT Short Term Goals - 04/07/19 2004      PT SHORT TERM GOAL #1   Title  Pt/wife will verbalize and demonstrate understanding of donning/doffing and wear/care of bilateral AFOs to assist in standing balance and gait.  TARGET 05/07/2019    Time  5    Period  Weeks    Status  New    Target Date  05/08/19      PT SHORT TERM GOAL #2   Title  Pt/wife will demonstrate optimal transfer technique, from chair, with use of gait belt for additional safety and  assistance with transfers.    Time  5    Period  Weeks    Status  New    Target Date  05/08/19      PT SHORT TERM GOAL #3   Title  Pt wil stand at least 10 minutes, wearing AFOs with use of UE support, for improved standing tolerance and ability.    Time  5    Period  Weeks    Status  New    Target Date  05/08/19      PT SHORT TERM GOAL #4   Title  Pt will ambulate at least 50 ft using bilateral AFOs, walking pole, and min guard assistance for improved household gait.    Time  5    Period  Weeks    Status  New    Target Date  05/08/19      PT SHORT TERM GOAL #5   Title  Pt will perform HEP for improved flexibility, strength, balance, with wife's assistance.    Time  5    Period  Weeks    Status  New    Target Date  05/08/19        PT Long Term Goals - 04/07/19 2009      PT LONG TERM GOAL #1   Title  Pt/wife will verbalize understanding of fall prevention in the home environment.  TARGET 06/04/2019    Time  9  Period  Weeks    Status  New    Target Date  06/04/19      PT LONG TERM GOAL #2   Title  Pt will perform sit to stand transfer from 18-20" surface with moderate assistance, for improved transfer ability.    Time  9    Period  Weeks    Status  New    Target Date  06/04/19      PT LONG TERM GOAL #3   Title  Pt will abmulate at least 100 ft using bilateral AFOs and walking pole, with supervision, for improved household gait.    Time  9    Period  Weeks    Status  New    Target Date  06/04/19      PT LONG TERM GOAL #4   Title  Pt will perform progression of HEP with wife's assistance, to address balance, strength and flexibility.    Time  9    Period  Weeks    Status  New    Target Date  06/04/19      PT LONG TERM GOAL #5   Title  Pt/wife will verbalize understanding of Parkinson's disease resources.    Time  9    Period  Weeks    Status  New    Target Date  06/04/19             Plan - 04/07/19 1954    Clinical Impression Statement  Pt is  a 63 year old male with history of [place] muscular dystrophy and new diagnosis of Parkinson's disease.  Pt presents with recent onset of R UE tremor and increased lower extremity weakness over the past 12-18 months, bilateral foot drop and steppage gait, decreased standing balance, decreased independence with transfers.  He presents with postural instability (needs support to stand), abnormal posture, muscle weakness.  He has had multiple falls (20-30 over the past 6 months), and wife is now having difficulty getting him up from floor when he falls.  He has new bilateral AFOs that he brought to session ,which he has not yet worn.  He desires to have improved independence with mobility (wants to avoid power mobility as long as possible) and learn how to use the new AFOs.  He will benefit from skilled PT to address the above stated deficits to decrease fall risk and improve functional mobility and safety.    Personal Factors and Comorbidities  Comorbidity 3+    Comorbidities  FSH muscular dystrophy and L hemidiaphragm paralysis, bilateral foot drop, CTS RUE, GERD, chronic constipation, bilateral cataract surgery; PD dx within past year by Dr. Jannifer Franklin    Examination-Activity Limitations  Stand;Transfers;Locomotion Level    Examination-Participation Restrictions  Other;Community Activity   Pt has had to retire in the past year   Stability/Clinical Decision Making  Evolving/Moderate complexity    Clinical Decision Making  Moderate    Rehab Potential  Good    PT Frequency  2x / week    PT Duration  Other (comment)   9 weeks   PT Treatment/Interventions  ADLs/Self Care Home Management;Gait training;Neuromuscular re-education;Balance training;Therapeutic exercise;Therapeutic activities;Functional mobility training;Orthotic Fit/Training;Patient/family education       Patient will benefit from skilled therapeutic intervention in order to improve the following deficits and impairments:  Abnormal gait, Decreased  balance, Decreased mobility, Difficulty walking, Decreased strength, Impaired UE functional use, Postural dysfunction  Visit Diagnosis: 1. Other abnormalities of gait and mobility   2. Unsteadiness on feet  3. Muscle weakness (generalized)   4. Abnormal posture        Problem List Patient Active Problem List   Diagnosis Date Noted  . Gait abnormality 10/19/2018  . Bilateral foot-drop 10/19/2018  . Parkinson's disease ([place]) 10/19/2018  . Swelling of lower leg - Right  03/06/2018  . Tremor of right hand 12/19/2017  . [place] (facioscapulohumeral muscular dystrophy) ([place]) 10/07/2017  . Dilated cardiomyopathy ([place]) 05/02/2017  . Upper airway cough syndrome 03/18/2014  . 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  03/17/2014  . Abnormal chest xray 03/16/2014    , W. 04/07/2019, 8:16 PM  Frazier Butt., PT   [facility] [address] [place], [place], [address] Phone: [phone]   Fax:  [phone]  Name: Luis [name] MRN: [id number] Date of Birth: [birth year]

## 2019-04-08 ENCOUNTER — Encounter: Payer: Self-pay | Admitting: Physical Therapy

## 2019-04-08 ENCOUNTER — Ambulatory Visit: Payer: BC Managed Care – PPO | Admitting: Physical Therapy

## 2019-04-08 DIAGNOSIS — R2681 Unsteadiness on feet: Secondary | ICD-10-CM

## 2019-04-08 DIAGNOSIS — R2689 Other abnormalities of gait and mobility: Secondary | ICD-10-CM | POA: Diagnosis not present

## 2019-04-08 DIAGNOSIS — M6281 Muscle weakness (generalized): Secondary | ICD-10-CM

## 2019-04-08 NOTE — Patient Instructions (Signed)
AFO Wear Time:  Try to wear the AFOs for 30-45 minutes.  Take them off and look to make sure there are no red spots.  You can increase the wear time by 15 minutes each day.  Just try to keep the AFOs on while you are doing normal things during your day.

## 2019-04-09 NOTE — Therapy (Signed)
[facility] [address] [place], [place], [address] Phone: [phone]   Fax:  [phone]  Physical Therapy Treatment  Patient Details  Name: Luis [name] MRN: [id number] Date of Birth: [birth year] Referring Provider (PT): Margette Fast   Encounter Date: 04/08/2019  CLINIC OPERATION CHANGES: Outpatient Neuro Rehab is open at lower capacity following universal masking, social distancing, and patient screening.  The patient's COVID risk of complications score is 2.   PT End of Session - 04/09/19 1819    Visit Number  2    Number of Visits  18    Date for PT Re-Evaluation  07/06/19    Authorization Type  BCBS-30 visit limit (combined with OT, chiro, in-home and oppt)-[employer]    Authorization - Visit Number  2    Authorization - Number of Visits  30    PT Start Time  9833    PT Stop Time  1531    PT Time Calculation (min)  45 min    Activity Tolerance  Patient tolerated treatment well    Behavior During Therapy  WFL for tasks assessed/performed       Past Medical History:  Diagnosis Date  . Bilateral foot-drop 10/19/2018  . Bursitis, trochanteric    Episodic  . Carpal tunnel syndrome on right   . Degenerative disk disease    l5-S1  . FSH (facioscapulohumeral muscular dystrophy) ([place]) 10/07/2017  . Gait abnormality 10/19/2018  . GERD (gastroesophageal reflux disease)   . Hemorrhoids    with anal fissures  . Left lateral epicondylitis   . MS (multiple sclerosis) ([place])   . Parkinson's disease ([place]) 10/19/2018    Past Surgical History:  Procedure Laterality Date  . CHOLECYSTECTOMY  2007  . HEMORRHOIDECTOMY WITH HEMORRHOID BANDING      There were no vitals filed for this visit.  Subjective Assessment - 04/09/19 1806    Subjective  Thinks he may have pulled a muscle in his back trying to get up from the low surface chair yesterday.  It happens occasionally.    Patient is accompained by:  Family member    wife, [name]   Pertinent History  [place] muscular dystrophy and L hemidiaphragm paralysis, bilateral foot drop, CTS RUE, GERD, chronic constipation, bilateral cataract surgery; PD dx within past year by Dr. Jannifer Franklin    Limitations  Sitting;Walking    Patient Stated Goals  Pt's goal is to be more mobile, move easier and be functional.  Per wife, get better understanding of how braces work.    Currently in Pain?  Yes    Pain Score  10-Worst pain ever    Pain Location  Back    Pain Orientation  Right;Left;Mid;Lower    Pain Descriptors / Indicators  --   feels like a pulled muscle   Pain Type  Acute pain    Pain Onset  More than a month ago    Pain Frequency  Constant    Aggravating Factors   repeated transfers    Pain Relieving Factors  medication                       [facility] Adult PT Treatment/Exercise - 04/09/19 0001      Transfers   Transfers  Sit to Stand;Stand to Sit    Sit to Stand  Other (comment);6: Modified independent (Device/Increase time)   From elevated stool-pt is able to perform mod I   Stand to Sit  5: Supervision;Without  upper extremity assist;Other (comment)   To elevated stool   Comments  Pt able to more comfortably transfer to elevated stool surface, with improved ease of transfers.  Upon standing with bilateral AFOs donned, he is noted to have base of support less wide than eval without AFOs.      Ambulation/Gait   Ambulation/Gait  Yes    Ambulation/Gait Assistance  5: Supervision    Ambulation/Gait Assistance Details  PT assists pt with donning and doffing bilateral AFOs.    Ambulation Distance (Feet)  33 Feet   x 2; 2 sets   Assistive device  Other (Comment)   Single walking pole   Gait Pattern  Step-to pattern;Step-through pattern;Decreased arm swing - right;Decreased step length - right;Decreased step length - left;Decreased dorsiflexion - right;Decreased dorsiflexion - left;Wide base of support;Right steppage;Left steppage    Gait Comments   Decreased steppage gait, improved foot clearance and step length with wearing bilateral AFOs.  During gait and turning around, pt leans on counter behind him to support him while he turns.      High Level Balance   High Level Balance Comments  Upon standing with UE support at counter, lateral weightshifting x 10 reps, for limits of stability work; then facing counter, anterior/posterior weigthshfiting x 10 reps (with cues to push less through UEs and increase weightshift through hips); attempted abdominal set/posterior pelvic tilts, but pt unable to perform due to decreased abdominal strength.        Self-Care   Self-Care  Other Self-Care Comments    Other Self-Care Comments   Discussed benefits of AFOs, including more normal gait pattern, less steppage gait, allowing for less energy expenditure with gait.  Answered pt's questions regarding appropriate shoe wear.  Discussed wear time and progression of wear time of AFOs, starting with about 30-45 minutes to start, then add 15 min per day.  Discussed goal would be to wear AFOs all day, to improve safety with gait whenever he is up.               PT Education - 04/09/19 1818    Education Details  AFO wear time and skin inspection, how to increase wear time with AFO; PT demonstrates donning/doffing AFO with wife present.    Person(s) Educated  Patient;Spouse    Methods  Explanation;Demonstration;Verbal cues;Handout    Comprehension  Verbalized understanding;Returned demonstration;Verbal cues required       PT Short Term Goals - 04/07/19 2004      PT SHORT TERM GOAL #1   Title  Pt/wife will verbalize and demonstrate understanding of donning/doffing and wear/care of bilateral AFOs to assist in standing balance and gait.  TARGET 05/07/2019    Time  5    Period  Weeks    Status  New    Target Date  05/08/19      PT SHORT TERM GOAL #2   Title  Pt/wife will demonstrate optimal transfer technique, from chair, with use of gait belt for  additional safety and assistance with transfers.    Time  5    Period  Weeks    Status  New    Target Date  05/08/19      PT SHORT TERM GOAL #3   Title  Pt wil stand at least 10 minutes, wearing AFOs with use of UE support, for improved standing tolerance and ability.    Time  5    Period  Weeks    Status  New    Target  Date  05/08/19      PT SHORT TERM GOAL #4   Title  Pt will ambulate at least 50 ft using bilateral AFOs, walking pole, and min guard assistance for improved household gait.    Time  5    Period  Weeks    Status  New    Target Date  05/08/19      PT SHORT TERM GOAL #5   Title  Pt will perform HEP for improved flexibility, strength, balance, with wife's assistance.    Time  5    Period  Weeks    Status  New    Target Date  05/08/19        PT Long Term Goals - 04/07/19 2009      PT LONG TERM GOAL #1   Title  Pt/wife will verbalize understanding of fall prevention in the home environment.  TARGET 06/04/2019    Time  9    Period  Weeks    Status  New    Target Date  06/04/19      PT LONG TERM GOAL #2   Title  Pt will perform sit to stand transfer from 18-20" surface with moderate assistance, for improved transfer ability.    Time  9    Period  Weeks    Status  New    Target Date  06/04/19      PT LONG TERM GOAL #3   Title  Pt will abmulate at least 100 ft using bilateral AFOs and walking pole, with supervision, for improved household gait.    Time  9    Period  Weeks    Status  New    Target Date  06/04/19      PT LONG TERM GOAL #4   Title  Pt will perform progression of HEP with wife's assistance, to address balance, strength and flexibility.    Time  9    Period  Weeks    Status  New    Target Date  06/04/19      PT LONG TERM GOAL #5   Title  Pt/wife will verbalize understanding of Parkinson's disease resources.    Time  9    Period  Weeks    Status  New    Target Date  06/04/19            Plan - 04/09/19 1819    Clinical  Impression Statement  Skilled PT session today focused on education to pt/wife on donning/doffing AFOs as well as wear time and goal of wearing AFOs through the day to improve ease of and safety of funcitonal mobility when ambulating.  Also worked on additional gait trials wearing bilateral AFOs; pt able to improve from 10 ft on eval day to 33 ft x 2, 2 sets, with improved gait pattern.  Pt continues to have significant gait deviations due to trunk and lower extremity weakness from [facility] MD; however, AFOs do improve foot clearance and gait mechanics.    Personal Factors and Comorbidities  Comorbidity 3+    Comorbidities  FSH muscular dystrophy and L hemidiaphragm paralysis, bilateral foot drop, CTS RUE, GERD, chronic constipation, bilateral cataract surgery; PD dx within past year by Dr. Jannifer Franklin    Examination-Activity Limitations  Stand;Transfers;Locomotion Level    Examination-Participation Restrictions  Other;Community Activity   Pt has had to retire in the past year   Stability/Clinical Decision Making  Evolving/Moderate complexity    Rehab Potential  Good    PT Frequency  2x / week    PT Duration  Other (comment)   9 weeks   PT Treatment/Interventions  ADLs/Self Care Home Management;Gait training;Neuromuscular re-education;Balance training;Therapeutic exercise;Therapeutic activities;Functional mobility training;Orthotic Fit/Training;Patient/family education    PT Next Visit Plan  Ask how AFOs are going at home; is wife able to don/doff?  What is the wear time?  Does it increase back pain?  Work on standing balance and gait activities; have wife return demo donning/doffing AFO    Consulted and Agree with Plan of Care  Patient;Family member/caregiver    Family Member Consulted  wife       Patient will benefit from skilled therapeutic intervention in order to improve the following deficits and impairments:  Abnormal gait, Decreased balance, Decreased mobility, Difficulty walking, Decreased strength,  Impaired UE functional use, Postural dysfunction  Visit Diagnosis: 1. Muscle weakness (generalized)   2. Other abnormalities of gait and mobility   3. Unsteadiness on feet        Problem List Patient Active Problem List   Diagnosis Date Noted  . Gait abnormality 10/19/2018  . Bilateral foot-drop 10/19/2018  . Parkinson's disease ([place]) 10/19/2018  . Swelling of lower leg - Right  03/06/2018  . Tremor of right hand 12/19/2017  . [place] (facioscapulohumeral muscular dystrophy) ([place]) 10/07/2017  . Dilated cardiomyopathy ([place]) 05/02/2017  . Upper airway cough syndrome 03/18/2014  . 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  03/17/2014  . Abnormal chest xray 03/16/2014    Osmel Dykstra W. 04/09/2019, 6:24 PM  Frazier Butt., PT   [facility] [address] [place], [place], [address] Phone: [phone]   Fax:  [phone]  Name: Luis [name] MRN: [id number] Date of Birth: December 08, 1955

## 2019-04-12 ENCOUNTER — Other Ambulatory Visit: Payer: Self-pay | Admitting: Internal Medicine

## 2019-04-12 ENCOUNTER — Telehealth: Payer: Self-pay | Admitting: Neurology

## 2019-04-12 DIAGNOSIS — G8929 Other chronic pain: Secondary | ICD-10-CM

## 2019-04-12 DIAGNOSIS — M545 Low back pain, unspecified: Secondary | ICD-10-CM

## 2019-04-12 MED ORDER — PREDNISONE 10 MG PO TABS: ORAL_TABLET | ORAL | 0 refills | Status: DC

## 2019-04-12 NOTE — Telephone Encounter (Signed)
I called the wife.  The patient has had an exacerbation of low back pain just within the last several days, he was progressing well with the physical therapy.  He did have one episode of dizziness, could possibly related to Sinemet, he is on 1/2 tablet 3 times daily, I will continue the medication for now.  I will send in a prednisone Dosepak for the next 6 days, if he is no better, they are to contact me.  His primary doctor set him up for an epidural steroid injection.  Previously, this was not helpful.

## 2019-04-12 NOTE — Telephone Encounter (Signed)
Pt's wife called stating that the pt's pain level is really bad and she would also like to discuss the therapy that he is receiving. Please advise.

## 2019-04-14 ENCOUNTER — Other Ambulatory Visit: Payer: Self-pay

## 2019-04-14 ENCOUNTER — Ambulatory Visit
Admission: RE | Admit: 2019-04-14 | Discharge: 2019-04-14 | Disposition: A | Discharge status: Home / Self Care | Payer: BC Managed Care – PPO | Source: Ambulatory Visit | Attending: Internal Medicine | Admitting: Internal Medicine

## 2019-04-14 DIAGNOSIS — M545 Low back pain, unspecified: Secondary | ICD-10-CM

## 2019-04-14 DIAGNOSIS — G8929 Other chronic pain: Secondary | ICD-10-CM

## 2019-04-14 MED ORDER — IOPAMIDOL (ISOVUE-M 200) INJECTION 41%
1.0000 mL | Freq: Once | INTRAMUSCULAR | Status: AC
  Administered 2019-04-14: 1 mL via EPIDURAL

## 2019-04-14 MED ORDER — METHYLPREDNISOLONE ACETATE 40 MG/ML INJ SUSP (RADIOLOG
120.0000 mg | Freq: Once | INTRAMUSCULAR | Status: AC
  Administered 2019-04-14: 120 mg via EPIDURAL

## 2019-04-14 NOTE — Discharge Instructions (Signed)

## 2019-04-15 ENCOUNTER — Other Ambulatory Visit: Payer: BC Managed Care – PPO

## 2019-04-16 ENCOUNTER — Encounter

## 2019-04-20 ENCOUNTER — Ambulatory Visit: Payer: BC Managed Care – PPO | Admitting: Physical Therapy

## 2019-04-20 NOTE — Telephone Encounter (Signed)
Megan, I reviewed the last few notes from Dr. Jannifer Franklin and I don;t see where he addressed back pain specifically. Can you review any phone calls and see if Dr. Jannifer Franklin had any plans for his low back?

## 2019-04-20 NOTE — Telephone Encounter (Signed)
Pt wife has called asking for a call back from RN to discuss that pt's pain has gone from a 10 to a 15 plus.  Wife states on Sunday pt finished his Prednisone and  Wife needs to know if Dr Jannifer Franklin wants pt to attend next physical therapy scheduled session.  Please call

## 2019-04-20 NOTE — Telephone Encounter (Addendum)
I reached out to the pt and spoke with wife. She reports the pt's back pain is still at a 10 + even after receiving an epidural on 04/15/2019 and taking the prescribed pain med from Dr. Inda Merlin ( Oxycodone 10-325 mg prn every 6 hours). Wife reports on 04/12/2019 she and Dr. Jannifer Franklin discussed a possible CT/MRI to further evaluate this issue. Pt was advised Dr. Jannifer Franklin is currently out of the office and will return on 04/26/2019. Pt was advised I could send message to covering MD as a FYI ,but this may have to wait until Dr. Jannifer Franklin returned to be handled and she verbalized she was ok with that.

## 2019-04-21 NOTE — Telephone Encounter (Signed)
It is in this telephone encounter (just scroll down a little bit). Dr. Jannifer Franklin spoke with pt's wife on 04/12/19 discussing his back pain and was told to call back if the prednisone did not help the back pain.   Looks like Barista, RN also advised pt's wife yesterday that his back pain concerns may have to wait until Dr. Jannifer Franklin returns.

## 2019-04-21 NOTE — Telephone Encounter (Signed)
I called pt's wife, Luis [name], per DPR, and had an extended conversation with her. Pt is going to have PT tomorrow and plans on stopping therapy if his pain gets out of control. She will call our office for an update if something "drastic" happens during PT.  She has lots of very kind things to say about Dr. Jannifer Franklin and our office.  She would appreciate a call from Dr. Jannifer Franklin to discuss how pt is doing when Dr. Jannifer Franklin is back to the office.  Pt's wife declined a f/u in our office as a work in visit at this time.

## 2019-04-21 NOTE — Telephone Encounter (Signed)
If he needs to be seen we can either wait for Dr. Jannifer Franklin to come back or see if any of the NPs can see him because I don;t have any appointments open this week. I'd prefer he be seen and examined, I read Dr. Jannifer Franklin' last notes and he didn;t mention it so we need an appointment. thanks

## 2019-04-22 ENCOUNTER — Ambulatory Visit: Payer: BC Managed Care – PPO | Admitting: Physical Therapy

## 2019-04-22 ENCOUNTER — Other Ambulatory Visit: Payer: Self-pay

## 2019-04-22 DIAGNOSIS — R2689 Other abnormalities of gait and mobility: Secondary | ICD-10-CM | POA: Diagnosis not present

## 2019-04-22 DIAGNOSIS — M6281 Muscle weakness (generalized): Secondary | ICD-10-CM

## 2019-04-22 DIAGNOSIS — R2681 Unsteadiness on feet: Secondary | ICD-10-CM

## 2019-04-22 NOTE — Patient Instructions (Signed)
Supine Knee-to-Chest, Unilateral    Lie on back, hands clasped behind one knee. Have wife assist as needed.  Pull knee in toward chest until a comfortable stretch is felt in lower back and buttocks. Hold 30 seconds.  Repeat 2-3 times per session. Do 2-3 sessions per day as needed.  Copyright  VHI. All rights reserved.

## 2019-04-22 NOTE — Therapy (Signed)
[facility] [address] [place], [place], [address] Phone: [phone]   Fax:  [phone]  Physical Therapy Treatment  Patient Details  Name: Luis [name] MRN: [id number] Date of Birth: [birth year] Referring Provider (PT): Margette Fast   Encounter Date: 04/22/2019  PT End of Session - 04/22/19 1356    Visit Number  3    Number of Visits  18    Date for PT Re-Evaluation  07/06/19    Authorization Type  [employer]-30 visit limit (combined with OT, chiro, in-home and oppt)-[employer]    Authorization - Visit Number  3    Authorization - Number of Visits  30    PT Start Time  0848    PT Stop Time  [id number]    PT Time Calculation (min)  50 min    Activity Tolerance  Patient tolerated treatment well    Behavior During Therapy  [facility] for tasks assessed/performed       Past Medical History:  Diagnosis Date  . Bilateral foot-drop 10/19/2018  . Bursitis, trochanteric    Episodic  . Carpal tunnel syndrome on right   . Degenerative disk disease    l5-S1  . FSH (facioscapulohumeral muscular dystrophy) ([place]) 10/07/2017  . Gait abnormality 10/19/2018  . GERD (gastroesophageal reflux disease)   . Hemorrhoids    with anal fissures  . Left lateral epicondylitis   . MS (multiple sclerosis) ([place])   . Parkinson's disease ([place]) 10/19/2018    Past Surgical History:  Procedure Laterality Date  . CHOLECYSTECTOMY  2007  . HEMORRHOIDECTOMY WITH HEMORRHOID BANDING      There were no vitals filed for this visit.  Subjective Assessment - 04/22/19 1350    Subjective  Pt and wife report increasing back pain since last visit.  Do not necessarily think it is related to PT.  Have called in to Dr Jannifer Franklin who is out of town until 8/24.  (See telephone encounter note from Dr Jannifer Franklin' office for details).  Report pt has been wearing the AFO's about 30 minutes a day.  Pt still fearful of falling even with braces.  Denies falls since last visit.    Patient  is accompained by:  Family member   wife, Luis [name]   Pertinent History  [place] muscular dystrophy and L hemidiaphragm paralysis, bilateral foot drop, CTS RUE, GERD, chronic constipation, bilateral cataract surgery; PD dx within past year by Dr. Jannifer Franklin    Limitations  Sitting;Walking    Patient Stated Goals  Pt's goal is to be more mobile, move easier and be functional.  Per wife, get better understanding of how braces work.    Currently in Pain?  Yes    Pain Score  10-Worst pain ever   Reports >10 at times   Pain Location  Back    Pain Orientation  Mid;Lower    Pain Descriptors / Indicators  Sharp    Pain Type  Chronic pain;Other (Comment)   but has increased in intensity   Pain Onset  More than a month ago    Pain Frequency  Constant    Aggravating Factors   unsure    Pain Relieving Factors  unsure;medications    Effect of Pain on Daily Activities  limits mobility and maintaining any position too long          [facility] Adult PT Treatment/Exercise - 04/22/19 0001      Transfers   Transfers  Sit to Stand;Stand to Sit    Sit  to Stand  Other (comment);6: Modified independent (Device/Increase time)   from elevated stool;   Stand to Sit  5: Supervision;Without upper extremity assist;Other (comment)   to elevated stool;PTA held stool for stability   Comments  Wife demonstrated on first sit<>stand how she helps pt at times during his L arm.  Pt with wide BOS with sit<>stand but it does decrease with bil AFO's      Ambulation/Gait   Ambulation/Gait  Yes    Ambulation/Gait Assistance  5: Supervision;4: Min guard   min guard at times as this PTA has not seen pt previously   Ambulation/Gait Assistance Details  PTA donned/doffed AFO's    Ambulation Distance (Feet)  40 Feet   x 1, 50' x 1 and 66' x 1   Assistive device  Other (Comment)    Gait Pattern  Step-to pattern;Step-through pattern;Decreased arm swing - right;Decreased step length - right;Decreased step length - left;Decreased dorsiflexion  - right;Decreased dorsiflexion - left;Wide base of support;Right steppage;Left steppage    Ambulation Surface  Level;Indoor    Gait Comments  Decreased steppage gait, improved foot clearance and step length with wearing bilateral AFOs.  While turning around, pt leans on counter behind him to support him while he turns.  Pt performed 2 bouts with tennis shoes with velcro closure and one rep with the tennis shoes he regularly wears.  Pt stated that he feels like the velcro shoes are heavy.  Pt did state he feels better in his normal tennis shoes.        Posture/Postural Control   Posture/Postural Control  Postural limitations    Postural Limitations  Rounded Shoulders;Posterior pelvic tilt      Self-Care   Self-Care  Other Self-Care Comments    Other Self-Care Comments   Again discussed benefits of AFOs, including more normal gait pattern, less steppage gait, allowing for less energy expenditure with gait.  Answered pt and wife's questions regarding appropriate shoe wear.  Discussed wear time and progression of wear time of AFOs, starting with about 30-45 minutes to start, then add 15 min per day.  Provided pt with knee to chest assisted stretch and discussed adding some gentle stretches and strengthening during next session.          PT Education - 04/22/19 1354    Education Details  HEP, continue to trial AFO's for 30 minutes a day, skin inspection after wearing AFO's, Other options for shoes (due to pt feels they are heavy),plans for future sessions    Person(s) Educated  Patient;Spouse    Methods  Explanation;Demonstration;Handout    Comprehension  Verbalized understanding       PT Short Term Goals - 04/07/19 2004      PT SHORT TERM GOAL #1   Title  Pt/wife will verbalize and demonstrate understanding of donning/doffing and wear/care of bilateral AFOs to assist in standing balance and gait.  TARGET 05/07/2019    Time  5    Period  Weeks    Status  New    Target Date  05/08/19      PT  SHORT TERM GOAL #2   Title  Pt/wife will demonstrate optimal transfer technique, from chair, with use of gait belt for additional safety and assistance with transfers.    Time  5    Period  Weeks    Status  New    Target Date  05/08/19      PT SHORT TERM GOAL #3   Title  Pt wil  stand at least 10 minutes, wearing AFOs with use of UE support, for improved standing tolerance and ability.    Time  5    Period  Weeks    Status  New    Target Date  05/08/19      PT SHORT TERM GOAL #4   Title  Pt will ambulate at least 50 ft using bilateral AFOs, walking pole, and min guard assistance for improved household gait.    Time  5    Period  Weeks    Status  New    Target Date  05/08/19      PT SHORT TERM GOAL #5   Title  Pt will perform HEP for improved flexibility, strength, balance, with wife's assistance.    Time  5    Period  Weeks    Status  New    Target Date  05/08/19        PT Long Term Goals - 04/07/19 2009      PT LONG TERM GOAL #1   Title  Pt/wife will verbalize understanding of fall prevention in the home environment.  TARGET 06/04/2019    Time  9    Period  Weeks    Status  New    Target Date  06/04/19      PT LONG TERM GOAL #2   Title  Pt will perform sit to stand transfer from 18-20" surface with moderate assistance, for improved transfer ability.    Time  9    Period  Weeks    Status  New    Target Date  06/04/19      PT LONG TERM GOAL #3   Title  Pt will abmulate at least 100 ft using bilateral AFOs and walking pole, with supervision, for improved household gait.    Time  9    Period  Weeks    Status  New    Target Date  06/04/19      PT LONG TERM GOAL #4   Title  Pt will perform progression of HEP with wife's assistance, to address balance, strength and flexibility.    Time  9    Period  Weeks    Status  New    Target Date  06/04/19      PT LONG TERM GOAL #5   Title  Pt/wife will verbalize understanding of Parkinson's disease resources.    Time  9     Period  Weeks    Status  New    Target Date  06/04/19            Plan - 04/22/19 1534    Clinical Impression Statement  Skilled session continued to work on gait with bil AFO's along with don/doffing and trial of different shoes.  Also education on importance of gentle stretching and strenghening to maintain the muscle strength he currently has.  Pt has been having back pain that has increased recently but pt and wife can not correlate with any particular activities or with PT sessions.  Cont PT per POC.    Personal Factors and Comorbidities  Comorbidity 3+    Comorbidities  FSH muscular dystrophy and L hemidiaphragm paralysis, bilateral foot drop, CTS RUE, GERD, chronic constipation, bilateral cataract surgery; PD dx within past year by Dr. Jannifer Franklin    Examination-Activity Limitations  Stand;Transfers;Locomotion Level    Examination-Participation Restrictions  Other;Community Activity   Pt has had to retire in the past year   Stability/Clinical Decision Making  Evolving/Moderate  complexity    Rehab Potential  Good    PT Frequency  2x / week    PT Duration  Other (comment)   9 weeks   PT Treatment/Interventions  ADLs/Self Care Home Management;Gait training;Neuromuscular re-education;Balance training;Therapeutic exercise;Therapeutic activities;Functional mobility training;Orthotic Fit/Training;Patient/family education    PT Next Visit Plan  Ask how AFOs are going at home; is wife able to don/doff?  What is the wear time?  Ask about knee to chest stretch provided last session.  Begin HEP for gentle LE strengthening and stretching.  Work on standing balance and gait activities; have wife return demo donning/doffing AFO.    PT Home Exercise Plan  knee to chest stretch active assist    Consulted and Agree with Plan of Care  Patient;Family member/caregiver    Family Member Consulted  wife       Patient will benefit from skilled therapeutic intervention in order to improve the following  deficits and impairments:  Abnormal gait, Decreased balance, Decreased mobility, Difficulty walking, Decreased strength, Impaired UE functional use, Postural dysfunction  Visit Diagnosis: Other abnormalities of gait and mobility  Muscle weakness (generalized)  Unsteadiness on feet     Problem List Patient Active Problem List   Diagnosis Date Noted  . Gait abnormality 10/19/2018  . Bilateral foot-drop 10/19/2018  . Parkinson's disease ([place]) 10/19/2018  . Swelling of lower leg - Right  03/06/2018  . Tremor of right hand 12/19/2017  . [place] (facioscapulohumeral muscular dystrophy) ([place]) 10/07/2017  . Dilated cardiomyopathy ([place]) 05/02/2017  . Upper airway cough syndrome 03/18/2014  . 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  03/17/2014  . Abnormal chest xray 03/16/2014    Narda Bonds, PTA [facility] 04/22/19 3:46 PM Phone: [phone] Fax: [facility] [address] [place], [place], [address] Phone: [phone]   Fax:  [phone]  Name: Luis [name] MRN: [id number] Date of Birth: 01-14-1956

## 2019-04-25 NOTE — Telephone Encounter (Signed)
I called and left a message, if there are still concerns about the low back pain, they will call our office next week.

## 2019-04-26 ENCOUNTER — Ambulatory Visit: Payer: BC Managed Care – PPO | Admitting: Physical Therapy

## 2019-04-26 ENCOUNTER — Other Ambulatory Visit: Payer: Self-pay

## 2019-04-26 ENCOUNTER — Encounter: Payer: Self-pay | Admitting: Physical Therapy

## 2019-04-26 DIAGNOSIS — R2689 Other abnormalities of gait and mobility: Secondary | ICD-10-CM | POA: Diagnosis not present

## 2019-04-26 DIAGNOSIS — R293 Abnormal posture: Secondary | ICD-10-CM

## 2019-04-26 DIAGNOSIS — R2681 Unsteadiness on feet: Secondary | ICD-10-CM

## 2019-04-26 NOTE — Therapy (Signed)
[facility] [address] [place], [place], [address] Phone: [phone]   Fax:  [phone]  Physical Therapy Treatment  Patient Details  Name: Luis [name] MRN: [id number] Date of Birth: [birth year] Referring Provider (PT): Margette Fast   Encounter Date: 04/26/2019  CLINIC OPERATION CHANGES: [facility] is open at lower capacity following universal masking, social distancing, and patient screening.  The patient's COVID risk of complications score is 2.   PT End of Session - 04/26/19 1954    Visit Number  4    Number of Visits  18    Date for PT Re-Evaluation  07/06/19    Authorization Type  BCBS-30 visit limit (combined with OT, chiro, in-home and oppt)-[employer]    Authorization - Visit Number  4    Authorization - Number of Visits  30    PT Start Time  0932    PT Stop Time  [id number]    PT Time Calculation (min)  42 min    Activity Tolerance  Patient tolerated treatment well    Behavior During Therapy  WFL for tasks assessed/performed       Past Medical History:  Diagnosis Date  . Bilateral foot-drop 10/19/2018  . Bursitis, trochanteric    Episodic  . Carpal tunnel syndrome on right   . Degenerative disk disease    l5-S1  . FSH (facioscapulohumeral muscular dystrophy) ([place]) 10/07/2017  . Gait abnormality 10/19/2018  . GERD (gastroesophageal reflux disease)   . Hemorrhoids    with anal fissures  . Left lateral epicondylitis   . MS (multiple sclerosis) ([place])   . Parkinson's disease ([place] [place]) 10/19/2018    Past Surgical History:  Procedure Laterality Date  . CHOLECYSTECTOMY  2007  . HEMORRHOIDECTOMY WITH HEMORRHOID BANDING      There were no vitals filed for this visit.  Subjective Assessment - 04/26/19 0936    Subjective  Back pain is a little better, still not gone.  Up to wearing the AFOs in the house 1 1/2 hours.  I still am so fearful of falling with them on.    Patient is accompained by:   Family member   wife, [name]   Pertinent History  [place]o muscular dystrophy and L hemidiaphragm paralysis, bilateral foot drop, CTS RUE, GERD, chronic constipation, bilateral cataract surgery; PD dx within past year by Dr. Jannifer Franklin    Limitations  Sitting;Walking    Patient Stated Goals  Pt's goal is to be more mobile, move easier and be functional.  Per wife, get better understanding of how braces work.    Currently in Pain?  Yes    Pain Score  10-Worst pain ever    Pain Location  Back    Pain Orientation  Mid;Lower    Pain Descriptors / Indicators  Sharp    Pain Type  Chronic pain    Pain Onset  More than a month ago    Pain Frequency  Constant    Aggravating Factors   unsure    Pain Relieving Factors  unsure, medications                       [facility] Adult PT Treatment/Exercise - 04/26/19 0001      Transfers   Transfers  Sit to Stand;Stand to Sit    Sit to Stand  Other (comment);6: Modified independent (Device/Increase time)   from elevated stool, elevated mat surface   Stand to Sit  5: Supervision;Without upper  extremity assist;Other (comment)   to elevated stool/mat; pt with widened BOS (without AFOs)     Ambulation/Gait   Ambulation/Gait  Yes    Ambulation/Gait Assistance  5: Supervision   Close supervision   Ambulation/Gait Assistance Details  PT donned/doffed AFOs.    Ambulation Distance (Feet)  50 Feet   x 2, 70 ft x 2 (Rm 1 to middle mat table)   Assistive device  Other (Comment)   Single walking pole   Gait Pattern  Step-to pattern;Step-through pattern;Decreased arm swing - right;Decreased step length - right;Decreased step length - left;Decreased dorsiflexion - right;Decreased dorsiflexion - left;Wide base of support;Right steppage;Left steppage   Decr steppage, improved step length, narrowed BOS w/ AFOs   Ambulation Surface  Level;Indoor    Gait Comments  Pt/wife have contacted Baxter Flattery with [employer] regarding shoewear, but have not heard back; pt  prefers velcro shoes today, as he felt his old shoes and AFOs last visit were an uncomfortable combination.  Overall, pt's gait pattern is improved with use of AFOs, but pt reports feeling unsteady.      Exercises   Exercises  Lumbar;Knee/Hip      Lumbar Exercises: Stretches   Active Hamstring Stretch  Right;Left;3 reps;30 seconds    Active Hamstring Stretch Limitations  Seated at elevated mat table, with foot propped on 12" block    Passive Hamstring Stretch  Right;Left;1 rep;20 seconds    Passive Hamstring Stretch Limitations  Supine passive hamstring stretch performed by PT    Single Knee to Chest Stretch  Right;Left;2 reps;30 seconds   Review with wife from last visit-she returns demo    Single Knee to Chest Stretch Limitations  Cues to keep hip/knee in neutral position to avoid external rotation in hip    Lower Trunk Rotation  3 reps;20 seconds    Lower Trunk Rotation Limitations  Started as gentle rocking side to side, then relaxed into lower trunk rotation stretch       Bed mobility:  Pt able to perform sit>supine on elevated (almost to highest height) mat with supervision; he performs supine>sit with min assist of wife for UE support to push up to sit.      PT Education - 04/26/19 1953    Education Details  Review of single knee to chest stretch, addition of hamstring and lumbar trunk rotation stretch; rationale of flexibility exercises    Person(s) Educated  Patient;Spouse    Methods  Explanation;Demonstration;Handout    Comprehension  Verbalized understanding;Returned demonstration;Verbal cues required       PT Short Term Goals - 04/07/19 2004      PT SHORT TERM GOAL #1   Title  Pt/wife will verbalize and demonstrate understanding of donning/doffing and wear/care of bilateral AFOs to assist in standing balance and gait.  TARGET 05/07/2019    Time  5    Period  Weeks    Status  New    Target Date  05/08/19      PT SHORT TERM GOAL #2   Title  Pt/wife will demonstrate  optimal transfer technique, from chair, with use of gait belt for additional safety and assistance with transfers.    Time  5    Period  Weeks    Status  New    Target Date  05/08/19      PT SHORT TERM GOAL #3   Title  Pt wil stand at least 10 minutes, wearing AFOs with use of UE support, for improved standing tolerance and ability.  Time  5    Period  Weeks    Status  New    Target Date  05/08/19      PT SHORT TERM GOAL #4   Title  Pt will ambulate at least 50 ft using bilateral AFOs, walking pole, and min guard assistance for improved household gait.    Time  5    Period  Weeks    Status  New    Target Date  05/08/19      PT SHORT TERM GOAL #5   Title  Pt will perform HEP for improved flexibility, strength, balance, with wife's assistance.    Time  5    Period  Weeks    Status  New    Target Date  05/08/19        PT Long Term Goals - 04/07/19 2009      PT LONG TERM GOAL #1   Title  Pt/wife will verbalize understanding of fall prevention in the home environment.  TARGET 06/04/2019    Time  9    Period  Weeks    Status  New    Target Date  06/04/19      PT LONG TERM GOAL #2   Title  Pt will perform sit to stand transfer from 18-20" surface with moderate assistance, for improved transfer ability.    Time  9    Period  Weeks    Status  New    Target Date  06/04/19      PT LONG TERM GOAL #3   Title  Pt will abmulate at least 100 ft using bilateral AFOs and walking pole, with supervision, for improved household gait.    Time  9    Period  Weeks    Status  New    Target Date  06/04/19      PT LONG TERM GOAL #4   Title  Pt will perform progression of HEP with wife's assistance, to address balance, strength and flexibility.    Time  9    Period  Weeks    Status  New    Target Date  06/04/19      PT LONG TERM GOAL #5   Title  Pt/wife will verbalize understanding of Parkinson's disease resources.    Time  9    Period  Weeks    Status  New    Target Date   06/04/19            Plan - 04/26/19 1957    Clinical Impression Statement  Skilled PT session to address trunk and lower extremity flexibility, as well as gait training with AFO's donned.  Pt continues to be fearful of falling while wearing AFOs, even though he appears to have steadier gait pattern with decreased steppage gait and less widened BOS.  Back pain that he has been complaining of over the past several weeks has gone down slightly (though his baseline pain measure is typicall 9-10).  Pt will continue to benefit from skilled PT to address progression of gait and balance with AFOs.    Personal Factors and Comorbidities  Comorbidity 3+    Comorbidities  FSH muscular dystrophy and L hemidiaphragm paralysis, bilateral foot drop, CTS RUE, GERD, chronic constipation, bilateral cataract surgery; PD dx within past year by Dr. Jannifer Franklin    Examination-Activity Limitations  Stand;Transfers;Locomotion Level    Examination-Participation Restrictions  Other;Community Activity   Pt has had to retire in the past year   Stability/Clinical Decision Making  Evolving/Moderate complexity    Rehab Potential  Good    PT Frequency  2x / week    PT Duration  Other (comment)   9 weeks   PT Treatment/Interventions  ADLs/Self Care Home Management;Gait training;Neuromuscular re-education;Balance training;Therapeutic exercise;Therapeutic activities;Functional mobility training;Orthotic Fit/Training;Patient/family education    PT Next Visit Plan  Review knee to chest and HEP for stretching; ask about pain/AFO wearing time.  Work on standing balance and gait activities (simulating household distances)    PT Home Exercise Plan  knee to chest stretch active assist    Consulted and Agree with Plan of Care  Patient;Family member/caregiver    Family Member Consulted  wife       Patient will benefit from skilled therapeutic intervention in order to improve the following deficits and impairments:  Abnormal gait,  Decreased balance, Decreased mobility, Difficulty walking, Decreased strength, Impaired UE functional use, Postural dysfunction  Visit Diagnosis: Other abnormalities of gait and mobility  Unsteadiness on feet  Abnormal posture     Problem List Patient Active Problem List   Diagnosis Date Noted  . Gait abnormality 10/19/2018  . Bilateral foot-drop 10/19/2018  . Parkinson's disease ([facility]) 10/19/2018  . Swelling of lower leg - Right  03/06/2018  . Tremor of right hand 12/19/2017  . Lutz (facioscapulohumeral muscular dystrophy) ([place]) 10/07/2017  . Dilated cardiomyopathy ([place] [facility]) 05/02/2017  . Upper airway cough syndrome 03/18/2014  . 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  03/17/2014  . Abnormal chest xray 03/16/2014    Lilah Mijangos W. 04/26/2019, 8:01 PM Frazier Butt., PT  [facility] [address] [place], [place], [address] Phone: [phone]   Fax:  [phone]  Name: Luis [name] MRN: [id number] Date of Birth: [birth year]

## 2019-04-26 NOTE — Patient Instructions (Addendum)
HIP: Hamstrings - Short Sitting    Rest leg on raised surface. Keep knee straight. Lift chest. Hold ___ seconds. ___ reps per set, ___ sets per day, ___ days per week  Copyright  VHI. All rights reserved.  HIP: Hamstrings - Short Sitting    Rest leg on raised surface. Keep knee straight. Lift chest. Hold ___ seconds. ___ reps per set, ___ sets per day, ___ days per week  Copyright  VHI. All rights reserved.  HIP: Hamstrings - Short Sitting    Rest leg on raised surface. Keep knee straight. Lift chest and lean forward for gentle stretch. Hold _15-30__ seconds. _3__ reps per set  (Pt was provided picture of seated hamstring stretch from back packet)  Copyright  VHI. All rights reserved.    Also provided picture from back packet for lower trunk rotation (instructions to have wife assist with rocking side to side gently then progress to gentle stretch), 3 reps 15-30 second hold.

## 2019-04-28 ENCOUNTER — Other Ambulatory Visit: Payer: Self-pay

## 2019-04-28 ENCOUNTER — Ambulatory Visit: Payer: BC Managed Care – PPO | Admitting: Physical Therapy

## 2019-04-28 ENCOUNTER — Encounter: Payer: Self-pay | Admitting: Physical Therapy

## 2019-04-28 DIAGNOSIS — R2689 Other abnormalities of gait and mobility: Secondary | ICD-10-CM | POA: Diagnosis not present

## 2019-04-28 DIAGNOSIS — R293 Abnormal posture: Secondary | ICD-10-CM

## 2019-04-28 DIAGNOSIS — R2681 Unsteadiness on feet: Secondary | ICD-10-CM

## 2019-04-28 NOTE — Therapy (Signed)
[facility] [address] [place], [place], [address] Phone: [phone]   Fax:  [phone]  Physical Therapy Treatment  Patient Details  Name: Luis [name] MRN: [id number] Date of Birth: [birth year] Referring Provider (PT): Margette Fast   Encounter Date: 04/28/2019  CLINIC OPERATION CHANGES: [facility] is open at lower capacity following universal masking, social distancing, and patient screening.  The patient's COVID risk of complications score is 2.   PT End of Session - 04/28/19 2209    Visit Number  5    Number of Visits  18    Date for PT Re-Evaluation  07/06/19    Authorization Type  BCBS-30 visit limit (combined with OT, chiro, in-home and oppt)-[employer]    Authorization - Visit Number  5    Authorization - Number of Visits  30    PT Start Time  [phone]    PT Stop Time  0928    PT Time Calculation (min)  45 min    Activity Tolerance  Patient tolerated treatment well    Behavior During Therapy  [facility] for tasks assessed/performed       Past Medical History:  Diagnosis Date  . Bilateral foot-drop 10/19/2018  . Bursitis, trochanteric    Episodic  . Carpal tunnel syndrome on right   . Degenerative disk disease    l5-S1  . FSH (facioscapulohumeral muscular dystrophy) ([facility]) 10/07/2017  . Gait abnormality 10/19/2018  . GERD (gastroesophageal reflux disease)   . Hemorrhoids    with anal fissures  . Left lateral epicondylitis   . MS (multiple sclerosis) ([facility])   . Parkinson's disease ([facility]) 10/19/2018    Past Surgical History:  Procedure Laterality Date  . CHOLECYSTECTOMY  2007  . HEMORRHOIDECTOMY WITH HEMORRHOID BANDING      There were no vitals filed for this visit.  Subjective Assessment - 04/28/19 2119    Subjective  Back pain back up to "a 12" today, lower R back muscles.  Going to see Baxter Flattery later this morning for possibility of new sneakers.    Patient is accompained by:  Family member    wife, [name]   Pertinent History  [place] muscular dystrophy and L hemidiaphragm paralysis, bilateral foot drop, CTS RUE, GERD, chronic constipation, bilateral cataract surgery; PD dx within past year by Dr. Jannifer Franklin    Limitations  Sitting;Walking    Patient Stated Goals  Pt's goal is to be more mobile, move easier and be functional.  Per wife, get better understanding of how braces work.    Currently in Pain?  Yes    Pain Score  --   12/10pain today   Pain Location  Back    Pain Orientation  Right;Lower    Pain Descriptors / Indicators  Aching   upon palpation, tightness noted in R lumbar musculature   Pain Type  Chronic pain    Pain Onset  More than a month ago    Pain Frequency  Constant    Aggravating Factors   unsure    Pain Relieving Factors  medications (has not had any today)                       [facility] Adult PT Treatment/Exercise - 04/28/19 0001      Transfers   Transfers  Sit to Stand;Stand to Sit    Sit to Stand  Other (comment);4: Min guard   from elevated stool, 3 reps during session  Stand to Sit  5: Supervision;Without upper extremity assist;Other (comment)   to elevated stool   Comments  Car transfer performed with therapist supervision (wife and pt concerned about his getting in the car with AFOs on (pt able to lift his legs independently and sit, pivot into car with supervision.      Ambulation/Gait   Ambulation/Gait  Yes    Ambulation/Gait Assistance  5: Supervision    Ambulation/Gait Assistance Details  PT donned AFOs at beginning of session.  Pt wears AFOs for remainder of PT session.  Wore AFOs out of session to car, as pt was going to meet with Baxter Flattery at shoe store to assess for potential other shoes to wear with his AFOs.    Ambulation Distance (Feet)  50 Feet   x 3, then 150 ft out of building to car   Assistive device  Other (Comment)   Single walking pole; wearing AFOs   Gait Pattern  Step-to pattern;Step-through pattern;Decreased arm  swing - right;Decreased step length - right;Decreased step length - left;Decreased dorsiflexion - right;Decreased dorsiflexion - left    Ambulation Surface  Level;Indoor    Gait Comments  Gait along counter in gym, including turn x 3 reps.  Pt able to perform turn by weightshifting and turning; does not lean on counter.      Exercises   Exercises  Lumbar;Other Exercises    Other Exercises   Verbally reviewed HEP from last visit-pt reports doing exercises and knee to chest stretch helps.  Pt tender to palpation along paraspinals, lumbar musculature along R side.  Used tennis ball along lumbar muscles with patient in standing for self-massage, performed 4 reps 1-2 minutes during PT session.  Pt reports pain has decreased to 10/10 after self-massage.  Educated patient in how to perform at home.             PT Education - 04/28/19 2208    Education Details  Use of tennis ball for self-massage to tight lumbar muscles.    Person(s) Educated  Patient;Spouse    Methods  Explanation;Demonstration;Handout    Comprehension  Verbalized understanding       PT Short Term Goals - 04/07/19 2004      PT SHORT TERM GOAL #1   Title  Pt/wife will verbalize and demonstrate understanding of donning/doffing and wear/care of bilateral AFOs to assist in standing balance and gait.  TARGET 05/07/2019    Time  5    Period  Weeks    Status  New    Target Date  05/08/19      PT SHORT TERM GOAL #2   Title  Pt/wife will demonstrate optimal transfer technique, from chair, with use of gait belt for additional safety and assistance with transfers.    Time  5    Period  Weeks    Status  New    Target Date  05/08/19      PT SHORT TERM GOAL #3   Title  Pt wil stand at least 10 minutes, wearing AFOs with use of UE support, for improved standing tolerance and ability.    Time  5    Period  Weeks    Status  New    Target Date  05/08/19      PT SHORT TERM GOAL #4   Title  Pt will ambulate at least 50 ft using  bilateral AFOs, walking pole, and min guard assistance for improved household gait.    Time  5  Period  Weeks    Status  New    Target Date  05/08/19      PT SHORT TERM GOAL #5   Title  Pt will perform HEP for improved flexibility, strength, balance, with wife's assistance.    Time  5    Period  Weeks    Status  New    Target Date  05/08/19        PT Long Term Goals - 04/07/19 2009      PT LONG TERM GOAL #1   Title  Pt/wife will verbalize understanding of fall prevention in the home environment.  TARGET 06/04/2019    Time  9    Period  Weeks    Status  New    Target Date  06/04/19      PT LONG TERM GOAL #2   Title  Pt will perform sit to stand transfer from 18-20" surface with moderate assistance, for improved transfer ability.    Time  9    Period  Weeks    Status  New    Target Date  06/04/19      PT LONG TERM GOAL #3   Title  Pt will abmulate at least 100 ft using bilateral AFOs and walking pole, with supervision, for improved household gait.    Time  9    Period  Weeks    Status  New    Target Date  06/04/19      PT LONG TERM GOAL #4   Title  Pt will perform progression of HEP with wife's assistance, to address balance, strength and flexibility.    Time  9    Period  Weeks    Status  New    Target Date  06/04/19      PT LONG TERM GOAL #5   Title  Pt/wife will verbalize understanding of Parkinson's disease resources.    Time  9    Period  Weeks    Status  New    Target Date  06/04/19            Plan - 04/28/19 2210    Clinical Impression Statement  Gait training with bilateral AFOs donned, with pt noted improved stability with turns today, with not needing to lean on counter to complete turn, x 3 reps today.  In addition, educated patient in self-massage using tennis ball and transfer into car with AFOs donned. Pt is making improvements in functional mobility and stability with AFOs.    Personal Factors and Comorbidities  Comorbidity 3+     Comorbidities  FSH muscular dystrophy and L hemidiaphragm paralysis, bilateral foot drop, CTS RUE, GERD, chronic constipation, bilateral cataract surgery; PD dx within past year by Dr. Jannifer Franklin    Examination-Activity Limitations  Stand;Transfers;Locomotion Level    Examination-Participation Restrictions  Other;Community Activity   Pt has had to retire in the past year   Stability/Clinical Decision Making  Evolving/Moderate complexity    Rehab Potential  Good    PT Frequency  2x / week    PT Duration  Other (comment)   9 weeks   PT Treatment/Interventions  ADLs/Self Care Home Management;Gait training;Neuromuscular re-education;Balance training;Therapeutic exercise;Therapeutic activities;Functional mobility training;Orthotic Fit/Training;Patient/family education    PT Next Visit Plan  Review hamstring and trunk rotation stretch if needed; ask about tennis ball self massage; work on standing balance and gait activities (simulating household distances/activities)    PT Home Exercise Plan  knee to chest stretch active assist    Consulted  and Agree with Plan of Care  Patient;Family member/caregiver    Family Member Consulted  wife       Patient will benefit from skilled therapeutic intervention in order to improve the following deficits and impairments:  Abnormal gait, Decreased balance, Decreased mobility, Difficulty walking, Decreased strength, Impaired UE functional use, Postural dysfunction  Visit Diagnosis: Other abnormalities of gait and mobility  Unsteadiness on feet  Abnormal posture     Problem List Patient Active Problem List   Diagnosis Date Noted  . Gait abnormality 10/19/2018  . Bilateral foot-drop 10/19/2018  . Parkinson's disease ([facility]) 10/19/2018  . Swelling of lower leg - Right  03/06/2018  . Tremor of right hand 12/19/2017  . Eclectic (facioscapulohumeral muscular dystrophy) ([place]) 10/07/2017  . Dilated cardiomyopathy ([place]) 05/02/2017  . Upper airway cough syndrome  03/18/2014  . 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  03/17/2014  . Abnormal chest xray 03/16/2014    Shenia Alan W. 04/28/2019, 10:14 PM Frazier Butt., PT  [facility] [address] [place], [place], [address] Phone: [phone]   Fax:  [phone]  Name: Luis [name] MRN: [id number] Date of Birth: Jul 10, 1956

## 2019-04-28 NOTE — Patient Instructions (Signed)
Luis [name]'s Pain seems to be most around the paraspinal muscles in the lumbar area (low thoracic/high lumbar) on the Right side.     *Use tennis ball on R paraspinal muscles (to the right of your spine), standing against the wall.  Roll the tennis ball along the area of tenderness in those muscles.  Do this for up to 3 minutes at a time, several times a day.  Should not increase the pain or make more tender.  *You can use a heating pad along the low back area where the pain is in your low back.  Do this for 10-15 minutes, 1-2 times a day.

## 2019-04-29 ENCOUNTER — Ambulatory Visit: Payer: BC Managed Care – PPO | Admitting: Physical Therapy

## 2019-05-03 ENCOUNTER — Encounter: Payer: Self-pay | Admitting: Physical Therapy

## 2019-05-03 ENCOUNTER — Ambulatory Visit: Payer: BC Managed Care – PPO | Admitting: Physical Therapy

## 2019-05-03 ENCOUNTER — Other Ambulatory Visit: Payer: Self-pay

## 2019-05-03 DIAGNOSIS — R2689 Other abnormalities of gait and mobility: Secondary | ICD-10-CM | POA: Diagnosis not present

## 2019-05-03 DIAGNOSIS — R2681 Unsteadiness on feet: Secondary | ICD-10-CM

## 2019-05-03 NOTE — Therapy (Signed)
[facility] [address] [place], [place], [address] Phone: [phone]   Fax:  [phone]  Physical Therapy Treatment  Patient Details  Name: Luis [name] MRN: [id number] Date of Birth: October 15, 1955 Referring Provider (PT): Margette Fast   Encounter Date: 05/03/2019  CLINIC OPERATION CHANGES: Outpatient [facility] Re[facility] is open at lower capacity following universal masking, social distancing, and patient screening.  The patient's COVID risk of complications score is 2.  PT End of Session - 05/03/19 2011    Visit Number  6    Number of Visits  18    Date for PT Re-Evaluation  07/06/19    Authorization Type  [employer]-30 visit limit (combined with OT, chiro, in-home and oppt)-[employer]    Authorization - Visit Number  6    Authorization - Number of Visits  30    PT Start Time  0720    PT Stop Time  0802    PT Time Calculation (min)  42 min    Activity Tolerance  Patient tolerated treatment well    Behavior During Therapy  WFL for tasks assessed/performed       Past Medical History:  Diagnosis Date  . Bilateral foot-drop 10/19/2018  . Bursitis, trochanteric    Episodic  . Carpal tunnel syndrome on right   . Degenerative disk disease    l5-S1  . FSH (facioscapulohumeral muscular dystrophy) ([place]) 10/07/2017  . Gait abnormality 10/19/2018  . GERD (gastroesophageal reflux disease)   . Hemorrhoids    with anal fissures  . Left lateral epicondylitis   . MS (multiple sclerosis) ([place])   . Parkinson's disease ([place]) 10/19/2018    Past Surgical History:  Procedure Laterality Date  . CHOLECYSTECTOMY  2007  . HEMORRHOIDECTOMY WITH HEMORRHOID BANDING      There were no vitals filed for this visit.  Subjective Assessment - 05/03/19 0724    Subjective  Per wife, pt more anxious today (about wearing the braces in today), and pain is worse.  The tennis ball has helped.  Wife reports no falls.    Patient is accompained by:  Family  member   wife, [name]   Pertinent History  [place] muscular dystrophy and L hemidiaphragm paralysis, bilateral foot drop, CTS RUE, GERD, chronic constipation, bilateral cataract surgery; PD dx within past year by Dr. Jannifer Franklin    Limitations  Sitting;Walking    Patient Stated Goals  Pt's goal is to be more mobile, move easier and be functional.  Per wife, get better understanding of how braces work.    Currently in Pain?  Yes    Pain Location  Back    Pain Orientation  Right    Pain Descriptors / Indicators  Aching    Pain Onset  More than a month ago    Pain Frequency  Constant    Aggravating Factors   Unsure if more walking and standing with braces are making pain worse    Pain Relieving Factors  tennis ball helps, medications help                       [facility] Adult PT Treatment/Exercise - 05/03/19 0001      Transfers   Transfers  Sit to Stand;Stand to Sit    Sit to Stand  Other (comment);4: Min guard   from elevated stool, 3x during session   Stand to Sit  5: Supervision;Without upper extremity assist;Other (comment)   to elevated stool  Ambulation/Gait   Ambulation/Gait  Yes    Ambulation/Gait Assistance  5: Supervision    Ambulation/Gait Assistance Details  Pt presents to therapy session today with AFOs donned.  PT adjusts AFOs mid-session, to loosen the lower straps.  Educated on leaving about 1 finger width under strap to avoid strap being too tight.    Ambulation Distance (Feet)  50 Feet   x 2; then additional 50 ft, x 2   Assistive device  Other (Comment)   Single walking pole   Gait Pattern  Step-to pattern;Step-through pattern;Decreased arm swing - right;Decreased step length - right;Decreased step length - left;Decreased dorsiflexion - right;Decreased dorsiflexion - left    Ambulation Surface  Level;Indoor    Gait Comments  Focus on turns, with cues to utilized wide BOS weigthshifting turning technique for improved stability with turns (pt does not lean hips  against counter during turns today).      Self-Care   Self-Care  Other Self-Care Comments    Other Self-Care Comments   Reivewed some aspects of AFO wear and care-including ways to make sure the straps are properly tight and not too snug; discussed purpose of overall wearing time (wife has him up and walking/standing most of the 2 1/2 hours he is wearing AFOs); PT recommends just his normal activity while wearing braces (he does not need to be walking or standing the entire time).  Discussed pt's goals for functional mobility within the home (50-200 ft), mostly to be more confident with AFOs.            Balance Exercises - 05/03/19 0743      Balance Exercises: Standing   Standing Eyes Opened  Wide (BOA);Head turns;Solid surface;5 reps   Head nods   Other Standing Exercises  Lateral weightshifting x 10 reps, then stagger stance foot position with diagonal weightshifting x 10, then wide BOS standing away from counter, with hip flexion/trunk extension (anterior/posterior hip weightshifting through hips;  Forward step and weightshift x 10 reps each leg, diagonal step and weigthshifts x 10 reps, then side step and weigthshifting x 10 (Pt stands with counter posterior to him and UE support at walking pole.      Reviewed seated hamstring stretch (at elevated stool), foot propped on floor, 2 reps x 15-30 seconds each.    PT Short Term Goals - 04/07/19 2004      PT SHORT TERM GOAL #1   Title  Pt/wife will verbalize and demonstrate understanding of donning/doffing and wear/care of bilateral AFOs to assist in standing balance and gait.  TARGET 05/07/2019    Time  5    Period  Weeks    Status  New    Target Date  05/08/19      PT SHORT TERM GOAL #2   Title  Pt/wife will demonstrate optimal transfer technique, from chair, with use of gait belt for additional safety and assistance with transfers.    Time  5    Period  Weeks    Status  New    Target Date  05/08/19      PT SHORT TERM GOAL #3    Title  Pt wil stand at least 10 minutes, wearing AFOs with use of UE support, for improved standing tolerance and ability.    Time  5    Period  Weeks    Status  New    Target Date  05/08/19      PT SHORT TERM GOAL #4   Title  Pt will  ambulate at least 50 ft using bilateral AFOs, walking pole, and min guard assistance for improved household gait.    Time  5    Period  Weeks    Status  New    Target Date  05/08/19      PT SHORT TERM GOAL #5   Title  Pt will perform HEP for improved flexibility, strength, balance, with wife's assistance.    Time  5    Period  Weeks    Status  New    Target Date  05/08/19        PT Long Term Goals - 04/07/19 2009      PT LONG TERM GOAL #1   Title  Pt/wife will verbalize understanding of fall prevention in the home environment.  TARGET 06/04/2019    Time  9    Period  Weeks    Status  New    Target Date  06/04/19      PT LONG TERM GOAL #2   Title  Pt will perform sit to stand transfer from 18-20" surface with moderate assistance, for improved transfer ability.    Time  9    Period  Weeks    Status  New    Target Date  06/04/19      PT LONG TERM GOAL #3   Title  Pt will abmulate at least 100 ft using bilateral AFOs and walking pole, with supervision, for improved household gait.    Time  9    Period  Weeks    Status  New    Target Date  06/04/19      PT LONG TERM GOAL #4   Title  Pt will perform progression of HEP with wife's assistance, to address balance, strength and flexibility.    Time  9    Period  Weeks    Status  New    Target Date  06/04/19      PT LONG TERM GOAL #5   Title  Pt/wife will verbalize understanding of Parkinson's disease resources.    Time  9    Period  Weeks    Status  New    Target Date  06/04/19            Plan - 05/03/19 2012    Clinical Impression Statement  Pt presents to OPPT today wearing AFOs, already donned by wife.  He ambulates 5 bouts of 5 ft with single walking pole, with seated breaks  and he tolerates at least 10 minutes of standing activities for balance (with standing rest breaks iwth hips against counter).  Pt will continue to beneift from skilled PT to address balance and gait.    Personal Factors and Comorbidities  Comorbidity 3+    Comorbidities  FSH muscular dystrophy and L hemidiaphragm paralysis, bilateral foot drop, CTS RUE, GERD, chronic constipation, bilateral cataract surgery; PD dx within past year by Dr. Jannifer Franklin    Examination-Activity Limitations  Stand;Transfers;Locomotion Level    Examination-Participation Restrictions  Other;Community Activity   Pt has had to retire in the past year   Stability/Clinical Decision Making  Evolving/Moderate complexity    Rehab Potential  Good    PT Frequency  2x / week    PT Duration  Other (comment)   9 weeks   PT Treatment/Interventions  ADLs/Self Care Home Management;Gait training;Neuromuscular re-education;Balance training;Therapeutic exercise;Therapeutic activities;Functional mobility training;Orthotic Fit/Training;Patient/family education    PT Next Visit Plan  Work on standing balance and gait activities (progressing distance, gait with  head turns?); try gait on compliant mats to simulate carpet; check STGs this week/next    PT Home Exercise Plan  knee to chest stretch active assist    Consulted and Agree with Plan of Care  Patient;Family member/caregiver    Family Member Consulted  wife       Patient will benefit from skilled therapeutic intervention in order to improve the following deficits and impairments:  Abnormal gait, Decreased balance, Decreased mobility, Difficulty walking, Decreased strength, Impaired UE functional use, Postural dysfunction  Visit Diagnosis: Other abnormalities of gait and mobility  Unsteadiness on feet     Problem List Patient Active Problem List   Diagnosis Date Noted  . Gait abnormality 10/19/2018  . Bilateral foot-drop 10/19/2018  . Parkinson's disease ([place]) 10/19/2018  .  Swelling of lower leg - Right  03/06/2018  . Tremor of right hand 12/19/2017  . [place] (facioscapulohumeral muscular dystrophy) ([name]) 10/07/2017  . Dilated cardiomyopathy ([place]) 05/02/2017  . Upper airway cough syndrome 03/18/2014  . 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  03/17/2014  . Abnormal chest xray 03/16/2014    Nayden Czajka W. 05/03/2019, 8:16 PM Frazier Butt., PT  [facility] [address] [place], [place], [address] Phone: [phone]   Fax:  [phone]  Name: Luis [name] MRN: [id number] Date of Birth: [birth year]

## 2019-05-05 ENCOUNTER — Telehealth: Payer: Self-pay | Admitting: Neurology

## 2019-05-05 ENCOUNTER — Ambulatory Visit: Payer: BC Managed Care – PPO | Admitting: Physical Therapy

## 2019-05-05 DIAGNOSIS — M545 Low back pain, unspecified: Secondary | ICD-10-CM

## 2019-05-05 DIAGNOSIS — G8929 Other chronic pain: Secondary | ICD-10-CM

## 2019-05-05 NOTE — Telephone Encounter (Signed)
Pt wife(on DPR) is asking for a call from RN to discuss pt's recent medical history and back pain since early July.

## 2019-05-05 NOTE — Addendum Note (Signed)
Addended by: Kathrynn Ducking on: 05/05/2019 12:42 PM   Modules accepted: Orders

## 2019-05-05 NOTE — Telephone Encounter (Signed)
I returned the pt's wife call. She reports the pt's back pain is still a 10 +. She reports he has canceled the last two PT sessions due to back pain and pt's wife is unsure of how to help him. Pt is tolerating the 3 tabs of sinemet 25-100 well along with the AFO's. Wife does report she is concerned the AFO's maybe increasing his back pain though due to his shoes not fitting properly.  Pt's most recent epidural back injection was 04/15/2019 and wife reports it did not provide much benefit.  She is wondering if a CT or MRI should be completed to determine the reason for this severe pain? Best call back # is [phone].

## 2019-05-05 NOTE — Telephone Encounter (Signed)
Pt's wife called wanting to know if she can be informed once the CT order has been sent in so that she can call right away to make an appt. Please advise.

## 2019-05-05 NOTE — Telephone Encounter (Signed)
I called the wife.  The patient is having severe low back pain without radiation down into the legs on either side, this is worse when he is up trying to walk.  He has not been able to tolerate physical therapy because of this, there to hold off on physical therapy for now, he did not respond to an epidural injection.  I will get MRI of the low back.

## 2019-05-06 ENCOUNTER — Ambulatory Visit
Admission: RE | Admit: 2019-05-06 | Discharge: 2019-05-06 | Disposition: A | Discharge status: Home / Self Care | Payer: BC Managed Care – PPO | Source: Ambulatory Visit | Attending: Neurology | Admitting: Neurology

## 2019-05-06 ENCOUNTER — Other Ambulatory Visit: Payer: Self-pay

## 2019-05-06 ENCOUNTER — Ambulatory Visit: Payer: BC Managed Care – PPO | Admitting: Physical Therapy

## 2019-05-06 DIAGNOSIS — M545 Low back pain, unspecified: Secondary | ICD-10-CM

## 2019-05-06 DIAGNOSIS — G8929 Other chronic pain: Secondary | ICD-10-CM

## 2019-05-07 MED ORDER — MELOXICAM 15 MG PO TABS: 15.0000 mg | ORAL_TABLET | Freq: Every day | ORAL | 3 refills | Status: DC

## 2019-05-07 NOTE — Telephone Encounter (Signed)
  I called the patient, talk with the wife.  The patient has multilevel degenerative changes, there is potential for impingement of the left L4 and left L5 nerve roots, but the patient is not having any sciatica.  I suspect most of his pain is related to arthritis, and muscular weakness with inability to support the spine properly with standing.  The patient has seen Dr. Nelva Bush previously, I will make another referral for pain management.  We will get him on Mobic to take daily along with his pain medications and muscle relaxants.  Oral prednisone did help some.   MRI lumbar 05/06/19:  IMPRESSION: This MRI of the lumbar spine without contrast shows multilevel degenerative changes as detailed above.  Most significant findings are:: 1.   At L2-L3, there is borderline spinal stenosis.  No nerve root compression.  The degenerative changes have mildly progressed compared to 2008. 2.   At L3-L4, there is mild spinal stenosis in the transverse diameter and facet hypertrophy with a small left synovial cyst.  There is moderately severe left lateral recess stenosis that could lead to left L4 nerve root compression.  The degenerative changes have progressed compared to 2008. 3.   At L4-L5, there is borderline spinal stenosis and moderate left lateral recess stenosis but there does not appear to be any nerve root compression.  The changes at this level have progressed compared to the 2008 MRI. 4.   At L5-S1, there is moderately severe left foraminal narrowing with potential for left L5 nerve root compression.  Degenerative changes have mildly progressed compared to the 2008 MRI.

## 2019-05-11 ENCOUNTER — Ambulatory Visit: Payer: BC Managed Care – PPO | Admitting: Physical Therapy

## 2019-05-12 ENCOUNTER — Telehealth: Payer: Self-pay | Admitting: Physical Therapy

## 2019-05-12 NOTE — Telephone Encounter (Signed)
Spoke with pt's wife, who wants to cancel remaining PT appointments at this time.  Pt has seen Dr. Jannifer Franklin, had MRI, followed up with Dr. Tillman Abide (MD specialist at [facility]) and is awaiting appointment with Dr. Nelva Bush (to address back pain) on 05/21/2019.  Due to pt's ongoing back pain and awaiting further medical follow-up, wife agreed with placing patient on hold for PT at this time.  She agrees to contact PT with updates if pt needs to be rescheduled to resume PT.  Mady Haagensen, PT 05/12/19 2:44 PM Phone: [phone] Fax: [phone]

## 2019-05-13 ENCOUNTER — Ambulatory Visit: Payer: BC Managed Care – PPO | Admitting: Physical Therapy

## 2019-05-17 ENCOUNTER — Ambulatory Visit: Payer: BC Managed Care – PPO | Admitting: Physical Therapy

## 2019-05-19 ENCOUNTER — Ambulatory Visit: Payer: BC Managed Care – PPO | Admitting: Physical Therapy

## 2019-05-24 ENCOUNTER — Ambulatory Visit: Payer: BC Managed Care – PPO | Admitting: Physical Therapy

## 2019-06-01 ENCOUNTER — Telehealth: Payer: Self-pay | Admitting: Neurology

## 2019-06-01 MED ORDER — MELOXICAM 15 MG PO TABS: 15.0000 mg | ORAL_TABLET | Freq: Every day | ORAL | 3 refills | Status: DC

## 2019-06-01 MED ORDER — CARBIDOPA-LEVODOPA 25-100 MG PO TABS: ORAL_TABLET | ORAL | 3 refills | Status: DC

## 2019-06-01 NOTE — Telephone Encounter (Signed)
I have refilled both medications for a 90 day supply as requested.

## 2019-06-01 NOTE — Addendum Note (Signed)
Addended by: Verlin Grills T on: 06/01/2019 09:18 AM   Modules accepted: Orders

## 2019-06-01 NOTE — Telephone Encounter (Signed)
Pts wife is calling in and requesting carbidopa-levodopa (SINEMET IR) 25-100 MG tablet to be a 90 day supply, she also states she would like the meloxicam (MOBIC) 15 MG tablet

## 2019-06-01 NOTE — Addendum Note (Signed)
Addended by: Verlin Grills T on: 06/01/2019 04:25 PM   Modules accepted: Orders

## 2019-07-15 ENCOUNTER — Ambulatory Visit: Payer: BC Managed Care – PPO | Admitting: Neurology

## 2019-07-27 ENCOUNTER — Ambulatory Visit: Payer: BC Managed Care – PPO | Admitting: Neurology

## 2019-07-28 ENCOUNTER — Ambulatory Visit: Payer: BC Managed Care – PPO | Admitting: Neurology

## 2019-09-02 ENCOUNTER — Encounter: Payer: Self-pay | Admitting: Physical Therapy

## 2019-09-02 NOTE — Therapy (Signed)
[facility] [address] [place], [place], [address] Phone: [phone]   Fax:  [phone]  Patient Details  Name: Luis [name] MRN: [id number] Date of Birth: 12-Jul-1956 Referring Provider:  No ref. provider found  Encounter Date: 09/02/2019  PHYSICAL THERAPY DISCHARGE SUMMARY  Visits from Start of Care: 6  Current functional level related to goals / functional outcomes: Pt's last visit was 05/03/2019; cancelled remaining visits due to awaiting MD visits and injections.  STG and LTGs not able to be fully assessed.   Remaining deficits: Gait, balance, transfers, pain   Education / Equipment: Educated in HEP-pt has demo understanding of HEP.  Plan: Patient agrees to discharge.  Patient goals were not met. Patient is being discharged due to not returning since the last visit.  ?????  Pt's last visit was 05/03/2019; pt's wife cancelled remaining appointments on 05/12/2019; on 06/09/2019, wife called to state that pt would not need additional PT at this time due to upcoming injections and MD visits.  Luis [name], PT 09/02/19 1:44 PM Phone: [phone] Fax: [phone]      Frazier Butt. 09/02/2019, 1:41 PM  [facility] [address] [place], [place], [address] Phone: [phone]   Fax:  [phone]

## 2019-09-23 ENCOUNTER — Ambulatory Visit: Payer: BC Managed Care – PPO | Admitting: Neurology

## 2019-09-23 ENCOUNTER — Encounter: Payer: Self-pay | Admitting: Neurology

## 2019-09-23 ENCOUNTER — Other Ambulatory Visit: Payer: Self-pay

## 2019-09-23 VITALS — BP 113/70 | HR 62 | Temp 98.2°F | Ht 71.0 in | Wt 157.6 lb

## 2019-09-23 DIAGNOSIS — R269 Unspecified abnormalities of gait and mobility: Secondary | ICD-10-CM

## 2019-09-23 DIAGNOSIS — G20 Parkinson's disease: Secondary | ICD-10-CM | POA: Diagnosis not present

## 2019-09-23 DIAGNOSIS — G7102 Facioscapulohumeral muscular dystrophy: Secondary | ICD-10-CM

## 2019-09-23 MED ORDER — CARBIDOPA-LEVODOPA ER 25-100 MG PO TBCR: 1.0000 | EXTENDED_RELEASE_TABLET | Freq: Three times a day (TID) | ORAL | 1 refills | Status: DC

## 2019-09-23 NOTE — Progress Notes (Signed)
Reason for visit: FSH dystrophy, Parkinson's disease, gait disorder  Luis [name] is an 64 y.o. male  History of present illness:  Luis [name] is a 64 year old right-handed white male with a history of FSH dystrophy and a history of Parkinson's disease primarily with right-sided features. The patient has a prominent resting tremor involving the right arm. The patient has bilateral foot drops, proximal weakness of the arms and legs. The patient has fallen once or twice since last seen. He is on Sinemet taking the 25/100 mg tablets 3 times daily but he reports some dizziness that occurs 15 to 20 minutes after each dose of medication. The patient has to sit down until the dizziness passes. He has not had any confusion or any nausea on the medication. He has lost weight however since last seen. The patient is followed through [facility], he is trying to get a motorized wheelchair for home use. The patient has not yet gotten his Covid vaccination, he has significant limitations to his respiratory effort with a history of diaphragmatic paralysis. He returns to the office today for an evaluation. The patient has been seen by Dr. Nelva Bush, he has received some injections in the back without significant improvement in his low back pain.  Past Medical History:  Diagnosis Date  . Bilateral foot-drop 10/19/2018  . Bursitis, trochanteric    Episodic  . Carpal tunnel syndrome on right   . Degenerative disk disease    l5-S1  . FSH (facioscapulohumeral muscular dystrophy) ([place]) 10/07/2017  . Gait abnormality 10/19/2018  . GERD (gastroesophageal reflux disease)   . Hemorrhoids    with anal fissures  . Left lateral epicondylitis   . MS (multiple sclerosis) ([place])   . Parkinson's disease ([place]) 10/19/2018    Past Surgical History:  Procedure Laterality Date  . CHOLECYSTECTOMY  2007  . HEMORRHOIDECTOMY WITH HEMORRHOID BANDING      Family History  Problem Relation Age of Onset  . Allergies Brother   .  Allergies Sister   . Heart disease Father   . Brain cancer Mother   . Bone cancer Brother     Social history:  reports that he has never smoked. He has never used smokeless tobacco. He reports that he does not drink alcohol or use drugs.   No Known Allergies  Medications:  Prior to Admission medications   Medication Sig Start Date End Date Taking? Authorizing Provider  carbidopa-levodopa (SINEMET IR) 25-100 MG tablet TAKE 1 TABLET THREE TIMES DAILY 06/01/19  Yes Kathrynn Ducking, MD  Carboxymethylcellulose Sodium (REFRESH TEARS OP) Place 1 drop into both eyes 6 (six) times daily.   Yes [provider]  Docusate Calcium (STOOL SOFTENER PO) Take 2 tablets by mouth at bedtime.   Yes [provider]  doxycycline (VIBRA-TABS) 100 MG tablet Take 100 mg by mouth 2 (two) times daily.   Yes [provider]  ibuprofen (ADVIL,MOTRIN) 800 MG tablet Take 800 mg by mouth 2 (two) times daily.   Yes [provider]  meloxicam (MOBIC) 15 MG tablet Take 1 tablet (15 mg total) by mouth daily. 06/01/19  Yes Kathrynn Ducking, MD  methocarbamol (ROBAXIN) 500 MG tablet Take 1,000 mg by mouth at bedtime as needed for muscle spasms.   Yes [provider]  naloxegol oxalate (MOVANTIK) 25 MG TABS tablet Take 25 mg by mouth as needed.    Yes [provider]  oxyCODONE-acetaminophen (PERCOCET) 10-325 MG tablet Take 1 tablet by mouth every 6 (  six) hours as needed for pain.   Yes [provider]  prednisoLONE acetate (PRED FORTE) 1 % ophthalmic suspension INT 1 DROP INTO BOTH EYES TWICE DAILY 03/04/18  Yes [provider]  timolol (BETIMOL) 0.25 % ophthalmic solution 1-2 drops 2 (two) times daily.   Yes [provider]  zolpidem (AMBIEN) 10 MG tablet Take 10 mg by mouth at bedtime as needed for sleep.   Yes [provider]    ROS:  Out of a complete 14 system review of symptoms, the patient complains only of the following symptoms,  and all other reviewed systems are negative.  Shortness of breath with physical exertion Walking difficulty Weakness Dizziness  Blood pressure 113/70, pulse 62, temperature 98.2 F (36.8 C), height 5\' 11"  (1.803 m), weight 157 lb 9.6 oz (71.5 kg).  Physical Exam  General: The patient is alert and cooperative at the time of the examination.  Skin: No significant peripheral edema is noted.   Neurologic Exam  Mental status: The patient is alert and oriented x 3 at the time of the examination. The patient has apparent normal recent and remote memory, with an apparently normal attention span and concentration ability.   Cranial nerves: Facial symmetry is present. Speech is normal, no aphasia or dysarthria is noted. Extraocular movements are full. Visual fields are full.  Motor: The patient has severe weakness with biceps and triceps bilaterally, the patient is able to elevate the arms about 45 or 50 degrees bilaterally with some weakness of the deltoids. The patient has 4/5 strength with hip flexion bilaterally, he has mild weakness with knee extension greater than flexion. He has prominent bilateral foot drops.  Sensory examination: Soft touch sensation is symmetric on the face, arms, and legs.  Coordination: The patient has significant difficulty performing finger-nose-finger bilaterally due to proximal arm weakness, he is able to perform heel-to-shin bilaterally. A prominent right upper extremity resting tremor is seen.  Gait and station: The patient has a wide-based gait, he walks with a cane. He has a steppage gait pattern bilaterally.  Reflexes: Deep tendon reflexes are symmetric, but are depressed.   MRI lumbar 05/06/19:  IMPRESSION: This MRI of the lumbar spine without contrast shows multilevel degenerative changes as detailed above. Most significant findings are:: 1. At L2-L3, there is borderline spinal stenosis. No nerve root compression. The degenerative changes have  mildly progressed compared to 2008. 2. At L3-L4, there is mild spinal stenosis in the transverse diameter and facet hypertrophy with a small left synovial cyst. There is moderately severe left lateral recess stenosis that could lead to left L4 nerve root compression. The degenerative changes have progressed compared to 2008. 3. At L4-L5, there is borderline spinal stenosis and moderate left lateral recess stenosis but there does not appear to be any nerve root compression. The changes at this level have progressed compared to the 2008 MRI. 4. At L5-S1, there is moderately severe left foraminal narrowing with potential for left L5 nerve root compression. Degenerative changes have mildly progressed compared to the 2008 MRI.   Assessment/Plan:  1. [place] dystrophy  2. Parkinson's disease  3. Gait disorder  3. Chronic low back pain  The patient will be switched from short acting Sinemet to Sinemet CR to see if we can reduce the symptoms of dizziness following dosing. The patient will be placed on the 25/100 mg CR tablets taking 1 tablet 3 times daily. The patient will take Benadryl 1/2 tablet 3 times a day if needed for  the tremor. He will follow-up here in 4 or 5 months.  Jill Alexanders MD 09/23/2019 2:19 PM  [facility] [address] [place], [place] [address]  Phone [phone] Fax [phone]

## 2019-10-03 ENCOUNTER — Ambulatory Visit: Payer: BC Managed Care – PPO

## 2019-10-24 ENCOUNTER — Ambulatory Visit: Payer: BC Managed Care – PPO

## 2020-01-03 ENCOUNTER — Other Ambulatory Visit: Payer: Self-pay | Admitting: Internal Medicine

## 2020-01-03 DIAGNOSIS — M5416 Radiculopathy, lumbar region: Secondary | ICD-10-CM

## 2020-01-05 ENCOUNTER — Ambulatory Visit
Admission: RE | Admit: 2020-01-05 | Discharge: 2020-01-05 | Disposition: A | Discharge status: Home / Self Care | Payer: BC Managed Care – PPO | Source: Ambulatory Visit | Attending: Internal Medicine | Admitting: Internal Medicine

## 2020-01-05 ENCOUNTER — Other Ambulatory Visit: Payer: Self-pay

## 2020-01-05 DIAGNOSIS — M5416 Radiculopathy, lumbar region: Secondary | ICD-10-CM

## 2020-01-06 ENCOUNTER — Other Ambulatory Visit: Payer: Self-pay | Admitting: Internal Medicine

## 2020-01-06 ENCOUNTER — Other Ambulatory Visit (HOSPITAL_COMMUNITY): Payer: Self-pay | Admitting: Internal Medicine

## 2020-01-06 DIAGNOSIS — S32050A Wedge compression fracture of fifth lumbar vertebra, initial encounter for closed fracture: Secondary | ICD-10-CM

## 2020-01-07 ENCOUNTER — Other Ambulatory Visit: Payer: Self-pay | Admitting: Student

## 2020-01-07 ENCOUNTER — Other Ambulatory Visit: Payer: Self-pay | Admitting: Radiology

## 2020-01-07 ENCOUNTER — Ambulatory Visit (HOSPITAL_COMMUNITY): Admission: RE | Admit: 2020-01-07 | Payer: BC Managed Care – PPO | Source: Ambulatory Visit

## 2020-01-10 ENCOUNTER — Other Ambulatory Visit: Payer: Self-pay

## 2020-01-10 ENCOUNTER — Ambulatory Visit (HOSPITAL_COMMUNITY)
Admission: RE | Admit: 2020-01-10 | Discharge: 2020-01-10 | Disposition: A | Discharge status: Home / Self Care | Payer: BC Managed Care – PPO | Source: Ambulatory Visit | Attending: Internal Medicine | Admitting: Internal Medicine

## 2020-01-10 ENCOUNTER — Other Ambulatory Visit (HOSPITAL_COMMUNITY): Payer: Self-pay | Admitting: Internal Medicine

## 2020-01-10 ENCOUNTER — Encounter (HOSPITAL_COMMUNITY): Payer: Self-pay

## 2020-01-10 DIAGNOSIS — M21372 Foot drop, left foot: Secondary | ICD-10-CM | POA: Diagnosis not present

## 2020-01-10 DIAGNOSIS — G5601 Carpal tunnel syndrome, right upper limb: Secondary | ICD-10-CM | POA: Diagnosis not present

## 2020-01-10 DIAGNOSIS — G35 Multiple sclerosis: Secondary | ICD-10-CM | POA: Diagnosis not present

## 2020-01-10 DIAGNOSIS — W19XXXA Unspecified fall, initial encounter: Secondary | ICD-10-CM | POA: Insufficient documentation

## 2020-01-10 DIAGNOSIS — M21371 Foot drop, right foot: Secondary | ICD-10-CM | POA: Diagnosis not present

## 2020-01-10 DIAGNOSIS — Z79899 Other long term (current) drug therapy: Secondary | ICD-10-CM | POA: Insufficient documentation

## 2020-01-10 DIAGNOSIS — Z791 Long term (current) use of non-steroidal anti-inflammatories (NSAID): Secondary | ICD-10-CM | POA: Insufficient documentation

## 2020-01-10 DIAGNOSIS — K219 Gastro-esophageal reflux disease without esophagitis: Secondary | ICD-10-CM | POA: Insufficient documentation

## 2020-01-10 DIAGNOSIS — Y92009 Unspecified place in unspecified non-institutional (private) residence as the place of occurrence of the external cause: Secondary | ICD-10-CM | POA: Diagnosis not present

## 2020-01-10 DIAGNOSIS — S32050A Wedge compression fracture of fifth lumbar vertebra, initial encounter for closed fracture: Secondary | ICD-10-CM

## 2020-01-10 DIAGNOSIS — G20 Parkinson's disease: Secondary | ICD-10-CM | POA: Diagnosis not present

## 2020-01-10 HISTORY — PX: IR KYPHO LUMBAR INC FX REDUCE BONE BX UNI/BIL CANNULATION INC/IMAGING: IMG5519

## 2020-01-10 LAB — CBC
HCT: 45.6 % (ref 39.0–52.0)
Hemoglobin: 15.2 g/dL (ref 13.0–17.0)
MCH: 32.5 pg (ref 26.0–34.0)
MCHC: 33.3 g/dL (ref 30.0–36.0)
MCV: 97.6 fL (ref 80.0–100.0)
Platelets: 223 10*3/uL (ref 150–400)
RBC: 4.67 MIL/uL (ref 4.22–5.81)
RDW: 12.5 % (ref 11.5–15.5)
WBC: 4.9 10*3/uL (ref 4.0–10.5)
nRBC: 0 % (ref 0.0–0.2)

## 2020-01-10 LAB — PROTIME-INR
INR: 1 (ref 0.8–1.2)
Prothrombin Time: 12.6 seconds (ref 11.4–15.2)

## 2020-01-10 MED ORDER — SODIUM CHLORIDE 0.9 % IV SOLN
INTRAVENOUS | Status: AC | PRN
  Administered 2020-01-10: 10 mL/h via INTRAVENOUS

## 2020-01-10 MED ORDER — BUPIVACAINE HCL (PF) 0.5 % IJ SOLN
INTRAMUSCULAR | Status: AC | PRN
  Administered 2020-01-10: 20 mL

## 2020-01-10 MED ORDER — TOBRAMYCIN SULFATE 1.2 G IJ SOLR
INTRAMUSCULAR | Status: AC
  Filled 2020-01-10: qty 1.2

## 2020-01-10 MED ORDER — SODIUM CHLORIDE 0.9 % IV SOLN: INTRAVENOUS | Status: DC

## 2020-01-10 MED ORDER — FENTANYL CITRATE (PF) 100 MCG/2ML IJ SOLN
INTRAMUSCULAR | Status: AC
  Filled 2020-01-10: qty 2

## 2020-01-10 MED ORDER — MIDAZOLAM HCL 2 MG/2ML IJ SOLN
INTRAMUSCULAR | Status: AC
  Filled 2020-01-10: qty 2

## 2020-01-10 MED ORDER — FENTANYL CITRATE (PF) 100 MCG/2ML IJ SOLN
INTRAMUSCULAR | Status: AC | PRN
  Administered 2020-01-10 (×2): 25 ug via INTRAVENOUS

## 2020-01-10 MED ORDER — CEFAZOLIN SODIUM-DEXTROSE 2-4 GM/100ML-% IV SOLN
INTRAVENOUS | Status: AC
  Filled 2020-01-10: qty 100

## 2020-01-10 MED ORDER — LIDOCAINE HCL 1 % IJ SOLN
INTRAMUSCULAR | Status: AC | PRN
  Administered 2020-01-10: 10 mL

## 2020-01-10 MED ORDER — BUPIVACAINE HCL (PF) 0.5 % IJ SOLN
INTRAMUSCULAR | Status: AC
  Filled 2020-01-10: qty 30

## 2020-01-10 MED ORDER — CEFAZOLIN SODIUM-DEXTROSE 2-4 GM/100ML-% IV SOLN
2.0000 g | Freq: Once | INTRAVENOUS | Status: AC
  Administered 2020-01-10: 2 g via INTRAVENOUS

## 2020-01-10 MED ORDER — MIDAZOLAM HCL 2 MG/2ML IJ SOLN
INTRAMUSCULAR | Status: AC | PRN
  Administered 2020-01-10: 0.5 mg via INTRAVENOUS
  Administered 2020-01-10: 1 mg via INTRAVENOUS

## 2020-01-10 MED ORDER — TOBRAMYCIN SULFATE 1.2 G IJ SOLR
INTRAMUSCULAR | Status: AC | PRN
  Administered 2020-01-10: .2 g

## 2020-01-10 MED ORDER — IOHEXOL 300 MG/ML  SOLN
50.0000 mL | Freq: Once | INTRAMUSCULAR | Status: AC | PRN
  Administered 2020-01-10: 20 mL

## 2020-01-10 MED ORDER — LIDOCAINE HCL 1 % IJ SOLN
INTRAMUSCULAR | Status: AC
  Filled 2020-01-10: qty 20

## 2020-01-10 NOTE — Procedures (Signed)
INTERVENTIONAL NEURORADIOLOGY BRIEF POSTPROCEDURE NOTE  Attending: Dr. Pedro Earls  Assistant: None  Diagnosis: L5 compression fracture  Access site: Percutaneous bilateral transpedicular  Anesthesia: Moderate sedation  Medication used: 1.5 mg Versed IV; 50 mcg Fentanyl IV. 2g Ancef.  Complications: None  Estimated blood loss: Minimal.  Specimen: L5 core bone biopsy sent for tissue exam.  Findings: Mild compression fracture of L5. Bilateral transpedicular approach kyphoplasty.  The patient tolerated the procedure well without incident or complication and is in stable condition.

## 2020-01-10 NOTE — Discharge Instructions (Addendum)
KYPHOPLASTY/VERTEBROPLASTY DISCHARGE INSTRUCTIONS  Medications:    Resume all home medications as before procedure.                   Continue your pain medications as prescribed as needed.  Over the next 3-5 days, decrease your pain medication as tolerated.  Over the counter medications (i.e. Tylenol, ibuprofen, and aleve) may be substituted once severe/moderate pain symptoms have subsided.   Wound Care: - Bandages may be removed the day following your procedure.  You may get your incision wet once bandages are removed.  Bandaids may be used to cover the incisions until scab formation.  Topical ointments are optional.  - If you develop a fever greater than 101 degrees, have increased skin redness at the incision sites or pus-like oozing from incisions occurring within 1 week of the procedure, contact radiology at [phone] or [phone].  - Ice pack to back for 15-20 minutes 2-3 time per day for first 2-3 days post procedure.  The ice will expedite muscle healing and help with the pain from the incisions.   Activity: - Bedrest today with limited activity for 24 hours post procedure.  - No driving for 48 hours.  - Increase your activity as tolerated after bedrest (with assistance if necessary).  - Refrain from any strenuous activity or heavy lifting (greater than 10 lbs.).   Follow up: - Contact radiology at [phone] or [phone] if any questions/concerns.  - A physician assistant from radiology will contact you in approximately 1 week.  - If a biopsy was performed at the time of your procedure, your referring physician should receive the results in usually 2-3 days.         

## 2020-01-10 NOTE — H&P (Signed)
Chief Complaint: Patient was seen in consultation today for L5 compression fracture  Referring Physician(s): Gates,Robert  Supervising Physician: Pedro Earls  Patient Status: [facility] - Out-pt  History of Present Illness: Luis [name] is a 64 y.o. male with past medical history of degenerative disk disease, muscular dystrophy, Parkinson's disease referred to [facility] for acute back pain.  He has a history of back pain and gait disorder related to his Parkinson's and muscular dystrophy, however he suffered an acute fall at home approximately 10 days ago with immediate severe pain in his back.  He attempted to recover at home, however the pain was unbearable and he sought evaluation with his Orthopedist last week.   MRI Lumbar Spine 01/06/20 showed: 1. Mild to moderate compression fracture L5 vertebral body which appears to be a recent healing fracture. No other fracture or mass in the lumbar spine 2. Marked paraspinous muscle atrophy posteriorly unchanged from the prior MRI. No evidence of lumbar spine surgery to account for the muscle atrophy. 3. Multilevel degenerative changes as above. The majority of the changes are stable from the prior MRI however there is a new left foraminal disc protrusion at L3-4. Additional finding. At L4-5, there is significant subarticular recess stenosis on the left with impingement of the left L5 nerve root. This was present on the previous study but shows mild Progression.  Luis [name] presents to [facility] today for evaluation and management of his L5 compression fracture.  His case has been reviewed and approved by Dr. Karenann Cai. He presents for procedure today in his usual state of health.  He is accompanied by his wife who states their hope with procedure today is for patient to return to baseline functional status with his typical, neuro-related back pain. They understand the benefits, risks, and expectations related to the  procedure today and are agreeable.  He has been NPO.  He does not take blood thinners.   Past Medical History:  Diagnosis Date  . Bilateral foot-drop 10/19/2018  . Bursitis, trochanteric    Episodic  . Carpal tunnel syndrome on right   . Degenerative disk disease    l5-S1  . FSH (facioscapulohumeral muscular dystrophy) ([place]) 10/07/2017  . Gait abnormality 10/19/2018  . GERD (gastroesophageal reflux disease)   . Hemorrhoids    with anal fissures  . Left lateral epicondylitis   . MS (multiple sclerosis) ([place])   . Parkinson's disease ([place]) 10/19/2018    Past Surgical History:  Procedure Laterality Date  . CHOLECYSTECTOMY  2007  . HEMORRHOIDECTOMY WITH HEMORRHOID BANDING      Allergies: Patient has no known allergies.  Medications: Prior to Admission medications   Medication Sig Start Date End Date Taking? Authorizing Provider  Carbidopa-Levodopa ER (SINEMET CR) 25-100 MG tablet controlled release Take 1 tablet by mouth 3 (three) times daily. 09/23/19  Yes Kathrynn Ducking, MD  Carboxymethylcellulose Sodium (REFRESH TEARS OP) Place 1 drop into both eyes 6 (six) times daily.   Yes [provider]  Docusate Calcium (STOOL SOFTENER PO) Take 2 tablets by mouth at bedtime.   Yes [provider]  doxycycline (VIBRA-TABS) 100 MG tablet Take 100 mg by mouth 2 (two) times daily.   Yes [provider]  ibuprofen (ADVIL,MOTRIN) 800 MG tablet Take 800 mg by mouth 2 (two) times daily.   Yes [provider]  methocarbamol (ROBAXIN) 500 MG tablet Take 1,000 mg by mouth at bedtime as needed for muscle spasms.   Yes [provider]  naloxegol oxalate (MOVANTIK) 25 MG TABS tablet Take 25 mg by mouth daily as needed.    Yes [provider]  oxyCODONE-acetaminophen (PERCOCET) 10-325 MG tablet Take 1 tablet by mouth every 6 (six) hours.    Yes [provider]  prednisoLONE acetate (PRED FORTE) 1 % ophthalmic suspension Place 1 drop into both  eyes in the morning and at bedtime.  03/04/18  Yes [provider]  timolol (BETIMOL) 0.25 % ophthalmic solution Place 1 drop into both eyes daily.    Yes [provider]  zolpidem (AMBIEN) 10 MG tablet Take 10 mg by mouth at bedtime as needed for sleep.   Yes [provider]  meloxicam (MOBIC) 15 MG tablet Take 1 tablet (15 mg total) by mouth daily. Patient not taking: Reported on 01/07/2020 06/01/19   Kathrynn Ducking, MD     Family History  Problem Relation Age of Onset  . Allergies Brother   . Allergies Sister   . Heart disease Father   . Brain cancer Mother   . Bone cancer Brother     Social History   Socioeconomic History  . Marital status: Married    Spouse name: [name]  . Number of children: 0  . Years of education: Not on file  . Highest education level: Not on file  Occupational History  . Occupation: Programmer, [employer]  Tobacco Use  . Smoking status: Never Smoker  . Smokeless tobacco: Never Used  Substance and Sexual Activity  . Alcohol use: No  . Drug use: No  . Sexual activity: Not on file  Other Topics Concern  . Not on file  Social History Narrative   Lives with wife   Caffeine use: Sometimes tea   Right handed    Social Determinants of Health   Financial Resource Strain:   . Difficulty of Paying Living Expenses:   Food Insecurity:   . Worried About Charity fundraiser in the Last Year:   . Arboriculturist in the Last Year:   Transportation Needs:   . Film/video editor (Medical):   Marland Kitchen Lack of Transportation (Non-Medical):   Physical Activity:   . Days of Exercise per Week:   . Minutes of Exercise per Session:   Stress:   . Feeling of Stress :   Social Connections:   . Frequency of Communication with Friends and Family:   . Frequency of Social Gatherings with Friends and Family:   . Attends Religious Services:   . Active Member of Clubs or Organizations:   . Attends Archivist Meetings:     Marland Kitchen Marital Status:      Review of Systems: A 12 point ROS discussed and pertinent positives are indicated in the HPI above.  All other systems are negative.  Review of Systems  Constitutional: Negative for fatigue and fever.  Respiratory: Negative for cough and shortness of breath.   Cardiovascular: Negative for chest pain.  Gastrointestinal: Negative for abdominal pain, constipation, diarrhea, nausea and vomiting.  Genitourinary: Negative for dysuria.  Musculoskeletal: Positive for back pain and gait problem.  Neurological: Positive for tremors (muscular dystrophy, parkinson's) and weakness.  Psychiatric/Behavioral: Negative for behavioral problems and confusion.    Vital Signs: There were no vitals taken for this visit.  Physical Exam Vitals and nursing note reviewed.  Constitutional:      General: He is not in acute distress.    Appearance: Normal appearance. He is not ill-appearing.  HENT:     Mouth/Throat:     Mouth: Mucous membranes are moist.     Pharynx: Oropharynx is clear.  Cardiovascular:     Rate and Rhythm: Normal rate and regular rhythm.  Pulmonary:     Effort: Pulmonary effort is normal. No respiratory distress.     Breath sounds: Normal breath sounds.  Musculoskeletal:        General: Tenderness present.     Comments: Decreased muscle tone  Skin:    General: Skin is warm and dry.  Neurological:     General: No focal deficit present.     Mental Status: He is alert and oriented to person, place, and time. Mental status is at baseline.  Psychiatric:        Mood and Affect: Mood normal.        Behavior: Behavior normal.        Thought Content: Thought content normal.        Judgment: Judgment normal.          Imaging: MR LUMBAR SPINE WO CONTRAST  Addendum Date: 01/06/2020   ADDENDUM REPORT: 01/06/2020 12:58 ADDENDUM: Additional finding. At L4-5, there is significant subarticular recess stenosis on the left with impingement of the left L5 nerve  root. This was present on the previous study but shows mild progression. Electronically Signed   By: Franchot Gallo M.D.   On: 01/06/2020 12:58   Result Date: 01/06/2020 CLINICAL DATA:  Lumbar radiculopathy, chronic. Fall 2 weeks ago with low back pain. EXAM: MRI LUMBAR SPINE WITHOUT CONTRAST TECHNIQUE: Multiplanar, multisequence MR imaging of the lumbar spine was performed. No intravenous contrast was administered. COMPARISON:  Lumbar MRI 05/06/2019 FINDINGS: Segmentation:  Normal Alignment:  Mild anterolisthesis L2-3. Vertebrae: Mild to moderate compression fracture of L5 with diffuse bone marrow edema suggesting a recent healing fracture. This was normal previously. No other fracture or mass in the lumbar spine. Conus medullaris and cauda equina: Conus extends to the L1-2 level. Conus and cauda equina appear normal. Paraspinal and other soft tissues: Negative for paraspinous mass edema or fluid collection. Marked paraspinous muscle atrophy bilaterally. This is unchanged from the prior study. Muscle volume was normal on MRI of 07/02/2007. Disc levels: L1-2: Mild disc bulging and mild facet degeneration. Small left foraminal disc protrusion with subarticular stenosis on the left. Mild spinal stenosis L2-3: Mild anterolisthesis. Disc bulging diffusely and bilateral facet hypertrophy. Mild spinal stenosis and mild subarticular stenosis bilaterally L3-4: Diffuse disc bulging with left foraminal disc protrusion which has progressed in the interval. Moderate facet hypertrophy bilaterally. Mild spinal stenosis. Moderate subarticular stenosis on the left due to disc protrusion and facet hypertrophy. L4-5: Disc degeneration with diffuse bulging of the disc. Advanced facet hypertrophy bilaterally. Mild spinal stenosis and moderate subarticular stenosis bilaterally. No significant interval change L5-S1: Mild disc degeneration and moderate facet degeneration bilaterally. Moderate subarticular stenosis bilaterally left  greater than right. No significant interval change. IMPRESSION: 1. Mild to moderate compression fracture L5 vertebral body which appears to be a recent healing fracture. No other fracture or mass in the lumbar spine 2. Marked paraspinous muscle atrophy posteriorly unchanged from the prior MRI. No evidence of lumbar spine surgery to account for the muscle atrophy. 3. Multilevel degenerative changes as above. The majority of the changes are stable from the prior MRI however there is a new left foraminal disc protrusion at L3-4. Electronically Signed: By: Franchot Gallo M.D. On: 01/06/2020 08:33    Labs:  CBC: No results for input(s):  WBC, HGB, HCT, PLT in the last 8760 hours.  COAGS: No results for input(s): INR, APTT in the last 8760 hours.  BMP: No results for input(s): NA, K, CL, CO2, GLUCOSE, BUN, CALCIUM, CREATININE, GFRNONAA, GFRAA in the last 8760 hours.  Invalid input(s): CMP  LIVER FUNCTION TESTS: No results for input(s): BILITOT, AST, ALT, ALKPHOS, PROT, ALBUMIN in the last 8760 hours.  TUMOR MARKERS: No results for input(s): AFPTM, CEA, CA199, CHROMGRNA in the last 8760 hours.  Assessment and Plan: Patient with past medical history of muscular dystrophy, Parkinson's disease presents with complaint of back pain after fall at home.  MR imaging demonstrates an L5 compression fracture.  IR consulted for vertebroplasty/kyphoplasty at L5 at the request of Dr. Inda Merlin. Case reviewed by Dr. Karenann Cai who approves patient for procedure.  Patient presents today in their usual state of health.  He has been NPO and is not currently on blood thinners.    Risks and benefits were discussed with the patient including, but not limited to education regarding the natural healing process of compression fractures without intervention, bleeding, infection, cement migration which may cause spinal cord damage, paralysis, pulmonary embolism or even death.  This interventional procedure  involves the use of X-rays and because of the nature of the planned procedure, it is possible that we will have prolonged use of X-ray fluoroscopy.  Potential radiation risks to you include (but are not limited to) the following: - A slightly elevated risk for cancer  several years later in life. This risk is typically less than 0.5% percent. This risk is low in comparison to the normal incidence of human cancer, which is 33% for women and 50% for men according to the [place]. - Radiation induced injury can include skin redness, resembling a rash, tissue breakdown / ulcers and hair loss (which can be temporary or permanent).   The likelihood of either of these occurring depends on the difficulty of the procedure and whether you are sensitive to radiation due to previous procedures, disease, or genetic conditions.   IF your procedure requires a prolonged use of radiation, you will be notified and given written instructions for further action.  It is your responsibility to monitor the irradiated area for the 2 weeks following the procedure and to notify your physician if you are concerned that you have suffered a radiation induced injury.    All of the patient's questions were answered, patient is agreeable to proceed.  Consent signed and in chart.  Thank you for this interesting consult.  I greatly enjoyed meeting Luis [name] and look forward to participating in their care.  A copy of this report was sent to the requesting provider on this date.  Electronically Signed: Docia Barrier, PA 01/10/2020, 7:22 AM   I spent a total of  30 Minutes   in face to face in clinical consultation, greater than 50% of which was counseling/coordinating care for L5 compression fracture.

## 2020-01-12 LAB — SURGICAL PATHOLOGY

## 2020-01-13 ENCOUNTER — Telehealth: Payer: Self-pay | Admitting: Neurology

## 2020-01-13 DIAGNOSIS — M5416 Radiculopathy, lumbar region: Secondary | ICD-10-CM

## 2020-01-13 MED ORDER — PREDNISONE 10 MG PO TABS: ORAL_TABLET | ORAL | 0 refills | Status: DC

## 2020-01-13 NOTE — Addendum Note (Signed)
Addended by: Kathrynn Ducking on: 01/13/2020 05:32 PM   Modules accepted: Orders

## 2020-01-13 NOTE — Telephone Encounter (Signed)
Called wife who stated patient fell 2 weeks ago, had compression fx L5 vertebrae. He had  kyphoplasty Monday. He now has developed pain down left buttock to hip and thigh. She stated it is "excruciating" pain. She contacted Dr Inda Merlin and Dr Norma Fredrickson who stated he needs to see a neurosurgeon for pinched nerve. Wife is concerned about the surgery and if he can tolerate it. She is requesting Dr Jannifer Franklin review all notes and wants to know if patient can be seen sooner than 02/24/20. She would like Dr Jannifer Franklin' advise re: neurosurgery consult. I advised will send her request to MD. She verbalized understanding, appreciation.

## 2020-01-13 NOTE — Telephone Encounter (Signed)
Pt's wife called requesting to speak to the RN to discuss future help that has been recommended for the pt. Please advise.

## 2020-01-18 ENCOUNTER — Other Ambulatory Visit: Payer: Self-pay | Admitting: Neurosurgery

## 2020-01-18 ENCOUNTER — Other Ambulatory Visit: Payer: Self-pay

## 2020-01-18 ENCOUNTER — Ambulatory Visit
Admission: RE | Admit: 2020-01-18 | Discharge: 2020-01-18 | Disposition: A | Discharge status: Home / Self Care | Payer: BC Managed Care – PPO | Source: Ambulatory Visit | Attending: Neurosurgery | Admitting: Neurosurgery

## 2020-01-18 DIAGNOSIS — M5442 Lumbago with sciatica, left side: Secondary | ICD-10-CM

## 2020-01-18 MED ORDER — GADOBENATE DIMEGLUMINE 529 MG/ML IV SOLN
15.0000 mL | Freq: Once | INTRAVENOUS | Status: AC | PRN
  Administered 2020-01-18: 15 mL via INTRAVENOUS

## 2020-01-20 ENCOUNTER — Other Ambulatory Visit: Payer: Self-pay

## 2020-01-20 MED ORDER — CARBIDOPA-LEVODOPA ER 25-100 MG PO TBCR: 1.0000 | EXTENDED_RELEASE_TABLET | Freq: Three times a day (TID) | ORAL | 1 refills | Status: DC

## 2020-02-24 ENCOUNTER — Encounter: Payer: Self-pay | Admitting: Neurology

## 2020-02-24 ENCOUNTER — Ambulatory Visit (INDEPENDENT_AMBULATORY_CARE_PROVIDER_SITE_OTHER): Payer: BC Managed Care – PPO | Admitting: Neurology

## 2020-02-24 ENCOUNTER — Other Ambulatory Visit: Payer: Self-pay

## 2020-02-24 VITALS — BP 100/65 | HR 55 | Ht 71.0 in | Wt 155.0 lb

## 2020-02-24 DIAGNOSIS — G20 Parkinson's disease: Secondary | ICD-10-CM

## 2020-02-24 DIAGNOSIS — R269 Unspecified abnormalities of gait and mobility: Secondary | ICD-10-CM | POA: Diagnosis not present

## 2020-02-24 DIAGNOSIS — G7102 Facioscapulohumeral muscular dystrophy: Secondary | ICD-10-CM

## 2020-02-24 NOTE — Progress Notes (Signed)
Reason for visit: [place] muscular dystrophy, gait disorder, Parkinson's disease, chronic pain  Luis [name] is an 64 y.o. male  History of present illness:  Luis [name] is a 64 year old right-handed white male with a history of FSH muscular dystrophy.  The patient is also had problems of Parkinson's disease primarily with right-sided features.  The has had increasing problems with walking.  He has had multiple falls and has had a compression fracture of the L5 vertebra requiring kyphoplasty.  Following the procedure, he had significant increase in pain going down the left leg to the foot.  MRI of the brain was done by Dr. [name] from neurosurgery and showed potential for nerve root impingement at the L3 and L5 nerve roots on the left.  The patient is now being followed by Dr. Brien Few, he is receiving injections.  The patient has been followed through the [facility] with Dr. Tillman Abide, he has been approved for a motorized wheelchair, he is on the Trilogy machine for his breathing at night.  The patient has recently placed on gabapentin for back pain without much benefit.  He uses a cane to get around.  He remains quite unsteady.  He has had chronic constipation on the Percocet, he takes about 3 tablets daily.  Past Medical History:  Diagnosis Date  . Bilateral foot-drop 10/19/2018  . Bursitis, trochanteric    Episodic  . Carpal tunnel syndrome on right   . Degenerative disk disease    l5-S1  . FSH (facioscapulohumeral muscular dystrophy) ([place]) 10/07/2017  . Gait abnormality 10/19/2018  . GERD (gastroesophageal reflux disease)   . Hemorrhoids    with anal fissures  . Left lateral epicondylitis   . MS (multiple sclerosis) ([place])   . Parkinson's disease ([place]) 10/19/2018    Past Surgical History:  Procedure Laterality Date  . CHOLECYSTECTOMY  2007  . HEMORRHOIDECTOMY WITH HEMORRHOID BANDING    . IR KYPHO LUMBAR INC FX REDUCE BONE BX UNI/BIL CANNULATION INC/IMAGING  01/10/2020    Family History   Problem Relation Age of Onset  . Allergies Brother   . Allergies Sister   . Heart disease Father   . Brain cancer Mother   . Bone cancer Brother     Social history:  reports that he has never smoked. He has never used smokeless tobacco. He reports that he does not drink alcohol and does not use drugs.   No Known Allergies  Medications:  Prior to Admission medications   Medication Sig Start Date End Date Taking? Authorizing Provider  Carbidopa-Levodopa ER (SINEMET CR) 25-100 MG tablet controlled release Take 1 tablet by mouth 3 (three) times daily. 01/20/20  Yes Kathrynn Ducking, MD  Carboxymethylcellulose Sodium (REFRESH TEARS OP) Place 1 drop into both eyes 6 (six) times daily.   Yes [provider]  Docusate Calcium (STOOL SOFTENER PO) Take 2 tablets by mouth at bedtime.   Yes [provider]  doxycycline (VIBRA-TABS) 100 MG tablet Take 100 mg by mouth 2 (two) times daily.   Yes [provider]  gabapentin (NEURONTIN) 300 MG capsule Take 300 mg by mouth 3 (three) times daily. 01/19/20  Yes [provider]  ibuprofen (ADVIL,MOTRIN) 800 MG tablet Take 800 mg by mouth 2 (two) times daily.   Yes [provider]  meloxicam (MOBIC) 15 MG tablet Take 1 tablet (15 mg total) by mouth daily. 06/01/19  Yes Kathrynn Ducking, MD  methocarbamol (ROBAXIN) 500 MG tablet Take 1,000 mg by mouth at  bedtime as needed for muscle spasms.   Yes [provider]  naloxegol oxalate (MOVANTIK) 25 MG TABS tablet Take 25 mg by mouth daily as needed.    Yes [provider]  oxyCODONE-acetaminophen (PERCOCET) 10-325 MG tablet Take 1 tablet by mouth every 6 (six) hours.    Yes [provider]  polyethylene glycol (GOLYTELY) 236 g solution nightly. 06/15/19  Yes [provider]  prednisoLONE acetate (PRED FORTE) 1 % ophthalmic suspension Place 1 drop into both eyes in the morning and at bedtime.  03/04/18  Yes [provider]    timolol (BETIMOL) 0.25 % ophthalmic solution Place 1 drop into both eyes daily.    Yes [provider]  timolol (TIMOPTIC) 0.5 % ophthalmic solution 1 drop daily. 01/20/20  Yes [provider]  zolpidem (AMBIEN) 10 MG tablet Take 10 mg by mouth at bedtime as needed for sleep.   Yes [provider]    ROS:  Out of a complete 14 system review of symptoms, the patient complains only of the following symptoms, and all other reviewed systems are negative.  Chronic back pain, left leg pain Constipation Muscle weakness Walking difficulty Tremor  Height 5\' 11"  (1.803 m), weight 155 lb (70.3 kg).  Physical Exam  General: The patient is alert and cooperative at the time of the examination.  Skin: No significant peripheral edema is noted.   Neurologic Exam  Mental status: The patient is alert and oriented x 3 at the time of the examination. The patient has apparent normal recent and remote memory, with an apparently normal attention span and concentration ability.   Cranial nerves: Facial symmetry is present. Speech is normal, no aphasia or dysarthria is noted. Extraocular movements are full. Visual fields are full.  Motor: The patient has good strength in the intrinsic muscles of the hands bilaterally with prominent weakness in the biceps and triceps, 4/5 strength in deltoid muscles bilaterally.  With the lower extremities, he has 4 -/5 strength with hip flexion bilaterally and 4/5 strength with knee extension and 3/5 strength with knee flexion.  Prominent bilateral foot drops are seen.  Sensory examination: Soft touch sensation is symmetric on the face, arms, and legs.  Coordination: The patient has difficulty performing finger-nose-finger and heel-to-shin on either side.  The patient has prominent resting tremors seen on the right upper extremity and a slight tremor with the head and neck.  Gait and station: The patient has a wide-based, waddling gait.  Tandem  gait was not attempted.  Reflexes: Deep tendon reflexes are symmetric.   Assessment/Plan:  1. [place] dystrophy  2.  Parkinson's disease  3.  Gait disorder  4.  Chronic low back pain  The patient continues to have ongoing significant discomfort, he is on Percocet on a regular basis.  The patient does have Parkinson's disease which likely is also an additive factor with his ability to ambulate.  He is on Sinemet CR taken 25/100 mg tab 3 times daily, we will continue on this current dose.  He will follow-up in 5 months.  He is being followed through Dr. Brien Few for chronic back pain.  If the injection procedure is not effective, he may need to convert to a long-acting opiate medication.  Greater than 50% of the visit was spent in counseling and coordination of care.  Face-to-face time with the patient was 30 minutes.   Jill Alexanders MD 02/24/2020 2:04 PM  [facility] [address] [place], [place]  [address]  Phone [phone] Fax [phone]

## 2020-06-21 ENCOUNTER — Telehealth: Payer: Self-pay | Admitting: Neurology

## 2020-06-21 ENCOUNTER — Telehealth: Payer: Self-pay | Admitting: Emergency Medicine

## 2020-06-21 NOTE — Telephone Encounter (Signed)
Pt's wife has returned the call to [facility], she is asking for a call back please.

## 2020-06-21 NOTE — Telephone Encounter (Signed)
Called and LVM on both mobile and home numbers.  Left office number to call back to.

## 2020-06-21 NOTE — Telephone Encounter (Signed)
Pt has called to report that he had a procedure done on his back and now the muscles in his back are bothering him.  Pt asking if this is likely to get better or if this is something he will just have to adapt to.  Please call(pt aware Dr Jannifer Franklin is not in the office this week, pt given option to send My chart message.  Pt declined asking this phone note be sent )

## 2020-06-21 NOTE — Telephone Encounter (Signed)
Called and LVM, wife called back.    Last Wednesday (06/14/20), patient had 'a nerve burned on the left side of his back around his L5" and they believe patient is having "Muscular Dystrophy muscle trouble due to the nerve flare up", wife has already called the surgeon back for advice.  She doesn't want to speak to another doctor, she believes Dr. Jannifer Franklin is the only doctor that will understand correlation between the procedure he had and his MD.  Patient is to have the nerve burned on the right side (06/28/20).  I strongly encouraged patient to listen to the surgeons advice regarding any pain issues he may be having regarding the surgery.  Patient's wife stated she was appreciative that I took the time to call her back and listen to her concerns and verbalized she was calling the surgeons office again.

## 2020-06-22 NOTE — Telephone Encounter (Signed)
I called and spoke with the wife.  The patient has been followed by Dr. Brien Few for pain issues.  He underwent a radiofrequency ablation procedure for that facet joint issue on the left with plans to do another procedure on the same level on the right side.  The patient had the procedure on 14 June 2020, he did well for several days, but that about 3 days after the procedure he began having increasing problems with walking.  He now is somewhat flexed when he is walking, the pain has markedly reduced however.  He has had no change in bowel or bladder function.  He has not had any falls.  It is not clear what has caused the change in his ambulation, the patient feels that the legs are somewhat weaker.  A bleeding complication or involvement of the nerve root following the procedure generally would have increased the pain, but the patient is actually doing better in this regard.  He has now on anti-inflammatory medications, and he may require some physical therapy in the future.  He is due to have another procedure on the other side next week, I indicated that they certainly need to discuss these changes in his walking with Dr. Brien Few before the procedure is done.

## 2020-06-27 NOTE — Telephone Encounter (Signed)
Called wife back and was able to schedule patient in for Thursday 10/28 @ 0930.  Luis [name] expressed appreciation and stated they would be here by 0915.

## 2020-06-27 NOTE — Telephone Encounter (Signed)
Pt's wife has called in re: to pt's back surgery.  Wife states pt recently saw Dr Ardeen Jourdain specialist with [facility]) she said this Dr is wanting pt to be seen by Dr Jannifer Franklin this week, wife was reminded of current appointment date and how beyond that Dr Jannifer Franklin is booking into the end of March.  Wife also reminded pt is on wait list, wife is asking for a call from [facility] to discuss any possible way of pt being seen this week.

## 2020-06-29 ENCOUNTER — Encounter: Payer: Self-pay | Admitting: Neurology

## 2020-06-29 ENCOUNTER — Ambulatory Visit
Admission: RE | Admit: 2020-06-29 | Discharge: 2020-06-29 | Disposition: A | Discharge status: Home / Self Care | Payer: Medicare Other | Source: Ambulatory Visit | Attending: Neurology | Admitting: Neurology

## 2020-06-29 ENCOUNTER — Other Ambulatory Visit: Payer: Self-pay

## 2020-06-29 ENCOUNTER — Ambulatory Visit (INDEPENDENT_AMBULATORY_CARE_PROVIDER_SITE_OTHER): Payer: Medicare Other | Admitting: Neurology

## 2020-06-29 VITALS — BP 124/80 | HR 79 | Ht 71.0 in | Wt 154.0 lb

## 2020-06-29 DIAGNOSIS — G7102 Facioscapulohumeral muscular dystrophy: Secondary | ICD-10-CM

## 2020-06-29 DIAGNOSIS — G20 Parkinson's disease: Secondary | ICD-10-CM

## 2020-06-29 DIAGNOSIS — M21371 Foot drop, right foot: Secondary | ICD-10-CM

## 2020-06-29 DIAGNOSIS — M21372 Foot drop, left foot: Secondary | ICD-10-CM

## 2020-06-29 DIAGNOSIS — R269 Unspecified abnormalities of gait and mobility: Secondary | ICD-10-CM | POA: Diagnosis not present

## 2020-06-29 DIAGNOSIS — M25552 Pain in left hip: Secondary | ICD-10-CM

## 2020-06-29 DIAGNOSIS — M25551 Pain in right hip: Secondary | ICD-10-CM

## 2020-06-29 NOTE — Progress Notes (Signed)
Reason for visit: [place] muscular dystrophy, Parkinson's disease, gait disorder  Luis [name]. is an 64 y.o. male  History of present illness:  Luis [name] is a 64 year old right-handed white male with a history of [place] muscular dystrophy with significant weakness of the arms and legs, right greater than left. He has a chronic gait disorder associated with this, he usually walks with a cane. The patient has had a lot of back pain and leg discomfort associated with facet joint arthritis. He has been followed by Dr. Brien Few, and he underwent a facet joint ablation procedure within the last 2 weeks. Immediately following the procedure the facet pain improved, but the patient noted a change in his ability to ambulate. He has had increasing difficulty getting his left leg to move forward to take a step. He feels weaker with the leg. He has Parkinson disease with primarily right-sided features. The patient initially had no significant discomfort with standing, but within the last several days he has developed bilateral left greater than right groin pain that goes away with sitting, and comes on with standing. The patient reports no new numbness, but does have some tingling sensations that go down from the back to the foot since the procedure. He has been back to see Dr. Brien Few who indicated that the procedure went well and there were no significant issues from what was done. The patient comes to this office for further evaluation. He has not had any falls.  Past Medical History:  Diagnosis Date  . Bilateral foot-drop 10/19/2018  . Bursitis, trochanteric    Episodic  . Carpal tunnel syndrome on right   . Degenerative disk disease    l5-S1  . FSH (facioscapulohumeral muscular dystrophy) ([place]) 10/07/2017  . Gait abnormality 10/19/2018  . GERD (gastroesophageal reflux disease)   . Hemorrhoids    with anal fissures  . Left lateral epicondylitis   . MS (multiple sclerosis) ([place])   . Parkinson's disease  ([place]) 10/19/2018    Past Surgical History:  Procedure Laterality Date  . CHOLECYSTECTOMY  2007  . HEMORRHOIDECTOMY WITH HEMORRHOID BANDING    . IR KYPHO LUMBAR INC FX REDUCE BONE BX UNI/BIL CANNULATION INC/IMAGING  01/10/2020    Family History  Problem Relation Age of Onset  . Allergies Brother   . Allergies Sister   . Heart disease Father   . Brain cancer Mother   . Bone cancer Brother     Social history:  reports that he has never smoked. He has never used smokeless tobacco. He reports that he does not drink alcohol and does not use drugs.   No Known Allergies  Medications:  Prior to Admission medications   Medication Sig Start Date End Date Taking? Authorizing Provider  Carbidopa-Levodopa ER (SINEMET CR) 25-100 MG tablet controlled release Take 1 tablet by mouth 3 (three) times daily. 01/20/20  Yes Kathrynn Ducking, MD  Carboxymethylcellulose Sodium (REFRESH TEARS OP) Place 1 drop into both eyes 6 (six) times daily.   Yes [provider]  Docusate Calcium (STOOL SOFTENER PO) Take 2 tablets by mouth at bedtime.   Yes [provider]  doxycycline (VIBRA-TABS) 100 MG tablet Take 100 mg by mouth 2 (two) times daily.   Yes [provider]  ibuprofen (ADVIL,MOTRIN) 800 MG tablet Take 800 mg by mouth 2 (two) times daily.   Yes [provider]  meloxicam (MOBIC) 15 MG tablet Take 1 tablet (15 mg total) by mouth daily. 06/01/19  Yes Jannifer Franklin,  Elon Alas, MD  naloxegol oxalate (MOVANTIK) 25 MG TABS tablet Take 25 mg by mouth daily as needed.    Yes [provider]  oxyCODONE-acetaminophen (PERCOCET) 10-325 MG tablet Take 1 tablet by mouth every 6 (six) hours.    Yes [provider]  polyethylene glycol (GOLYTELY) 236 g solution nightly. 06/15/19  Yes [provider]  prednisoLONE acetate (PRED FORTE) 1 % ophthalmic suspension Place 1 drop into both eyes in the morning and at bedtime.  03/04/18  Yes [provider]  zolpidem  (AMBIEN) 10 MG tablet Take 10 mg by mouth at bedtime as needed for sleep.   Yes [provider]  gabapentin (NEURONTIN) 300 MG capsule Take 300 mg by mouth 3 (three) times daily. 01/19/20   [provider]  methocarbamol (ROBAXIN) 500 MG tablet Take 1,000 mg by mouth at bedtime as needed for muscle spasms.    [provider]  timolol (BETIMOL) 0.25 % ophthalmic solution Place 1 drop into both eyes daily.     [provider]  timolol (TIMOPTIC) 0.5 % ophthalmic solution 1 drop daily. 01/20/20   [provider]    ROS:  Out of a complete 14 system review of symptoms, the patient complains only of the following symptoms, and all other reviewed systems are negative.  Hip pain Walking difficulty Tremors  Blood pressure 124/80, pulse 79, height 5\' 11"  (1.803 m), weight 154 lb (69.9 kg).  Physical Exam  General: The patient is alert and cooperative at the time of the examination.  Skin: No significant peripheral edema is noted.   Neurologic Exam  Mental status: The patient is alert and oriented x 3 at the time of the examination. The patient has apparent normal recent and remote memory, with an apparently normal attention span and concentration ability.   Cranial nerves: Facial symmetry is present. Speech is normal, no aphasia or dysarthria is noted. Extraocular movements are full. Visual fields are full.  Motor: The patient has 4/5 strength with deltoid muscles bilaterally, 2/5 strength with biceps and triceps bilaterally, the left arm is slightly stronger. The patient has good grip strength and intrinsic muscle strength of the hands. With the lower extremities, the patient has 4/5 strength with hip flexion, the right side is slightly weaker. Patient has 4+/5 strength with knee extension and flexion. The patient has 2/5 strength with adduction of the thighs, 4/5 strength with abduction. Prominent bilateral foot drops were seen.  Sensory  examination: Soft touch sensation is symmetric on the face, arms, and legs.  Coordination: The patient has some difficulty performing finger-nose-finger and heel shin bilaterally. The patient has a prominent resting tremor of the right upper extremity.  Gait and station: The patient has a wide-based gait, he has to lean forward and lean on his walking stick and swing his leg forward. The patient is able to walk with a cane.  Reflexes: Deep tendon reflexes are symmetric.   Assessment/Plan:  1. [place] muscular dystrophy  2. Gait disorder with recent worsening  3. Chronic low back pain, recent facet ablation procedure  4. Parkinson's disease primarily with right-sided features  The patient reports a sudden change in his ability to ambulate following the recent facet ablation procedure, it is not clear exactly why his walking has changed. The patient is now reporting bilateral hip pain and pain in the groin bilaterally. The patient will be sent for x-rays of the hips bilaterally. He will be set up for physical therapy in the home environment  to work on gait and leg strengthening. He will follow-up here for his next visit in December.  Jill Alexanders MD 06/29/2020 9:39 AM  [facility] [address] [place], [place] [address]  Phone [phone] Fax [phone]

## 2020-07-03 ENCOUNTER — Telehealth: Payer: Self-pay | Admitting: Neurology

## 2020-07-03 NOTE — Telephone Encounter (Signed)
Pt's wife(Locken,Sandra on DPR) has called in response to message from Dr Jannifer Franklin.  Wife is asking for a call from [facility] to discuss home therapy for pt.  Please call.

## 2020-07-03 NOTE — Telephone Encounter (Signed)
I called about the x-ray of the hips, no significant degenerative changes in the hips are noted. Patient is having bilateral hip pain and groin pain, left greater than right.    XR hips bilaterally 07/01/20:  IMPRESSION: 1. Sequela of previous L5 vertebral augmentation. 2. Otherwise unremarkable pelvis and bilateral hips.

## 2020-07-03 NOTE — Telephone Encounter (Signed)
Called and spoke to [place], Patient's wife. Katharine Look aware referral for Home Health was made on 10/28 and will be contacted for arrangements and visits through the [facility].  Katharine Look expressed appreciation.

## 2020-07-11 ENCOUNTER — Other Ambulatory Visit: Payer: Self-pay | Admitting: Neurology

## 2020-07-11 DIAGNOSIS — R29898 Other symptoms and signs involving the musculoskeletal system: Secondary | ICD-10-CM

## 2020-07-12 ENCOUNTER — Telehealth: Payer: Self-pay | Admitting: Neurology

## 2020-07-12 NOTE — Telephone Encounter (Signed)
Charise @ [facility] called for verbal orders for PT frequencies: 1 week 1, 2 week 1, 1 week 1, 2 week 1 and 1 week 1 her voicemail is secured if a message needs to be left

## 2020-07-13 NOTE — Telephone Encounter (Signed)
VO given for PT orders as written, total of 7.  LVM

## 2020-07-14 ENCOUNTER — Other Ambulatory Visit: Payer: Self-pay

## 2020-07-14 ENCOUNTER — Ambulatory Visit
Admission: RE | Admit: 2020-07-14 | Discharge: 2020-07-14 | Disposition: A | Discharge status: Home / Self Care | Payer: Medicare Other | Source: Ambulatory Visit | Attending: Neurology | Admitting: Neurology

## 2020-07-14 DIAGNOSIS — R29898 Other symptoms and signs involving the musculoskeletal system: Secondary | ICD-10-CM

## 2020-07-18 ENCOUNTER — Other Ambulatory Visit: Payer: Medicare Other

## 2020-07-20 ENCOUNTER — Telehealth: Payer: Self-pay | Admitting: Neurology

## 2020-07-20 NOTE — Telephone Encounter (Signed)
[place] Nestor Lewandowsky) called, reporting fall of pt on Friday 11/12. Pt fell in the parking lot at GI getting MRI. Staff at GI examine him and said he was ok. Then he went home after his MRI. Same day started having pain in left hip in groin area. Also reporting to PCP, Dr. Inda Merlin and to have him schedule an xray. To contact me; [phone]

## 2020-07-22 NOTE — Telephone Encounter (Signed)
Not sure how I would have much else to add.  I agree with x-ray.

## 2020-07-30 ENCOUNTER — Other Ambulatory Visit: Payer: Medicare Other

## 2020-08-07 ENCOUNTER — Telehealth: Payer: Self-pay | Admitting: Neurology

## 2020-08-07 NOTE — Telephone Encounter (Signed)
Pt's wife, Katharine Look [place] (on [place]) called, he's getting an assessment done today. Will you send a order for physical therapy so he can keep his therapy. Would like a call from the nurse.

## 2020-08-07 NOTE — Telephone Encounter (Signed)
I called and left a message for voice order for physical therapy.  Number called is [phone].

## 2020-08-07 NOTE — Telephone Encounter (Signed)
Charris G. Physical Therapist from [facility] called needing VO for  1 week once a week 2 weeks four times a week  Starting 08/08/20 Please advise.

## 2020-08-09 NOTE — Telephone Encounter (Signed)
Called patient and left message for Luis [name].  Informed her that I spoke to Luis [name], Luis [name] with Luis [name] and was told they have already spoken to her and have an appointment with Luis [name] Thursday 08/09/2020 for PT.  If she has any additional questions, she can call the PT back @ [phone].

## 2020-08-09 NOTE — Telephone Encounter (Signed)
Pt's wife, Katharine Look [place] (on [place]) called, would like to confirm physical therapy has been sent. Informed Ms. Romito nurse had left a message for voice order for physical therapy. Would like a call from the nurse.

## 2020-08-21 ENCOUNTER — Encounter: Payer: Self-pay | Admitting: Neurology

## 2020-08-21 ENCOUNTER — Ambulatory Visit (INDEPENDENT_AMBULATORY_CARE_PROVIDER_SITE_OTHER): Payer: Medicare Other | Admitting: Neurology

## 2020-08-21 VITALS — BP 105/68 | HR 52 | Ht 71.0 in | Wt 150.0 lb

## 2020-08-21 DIAGNOSIS — R269 Unspecified abnormalities of gait and mobility: Secondary | ICD-10-CM | POA: Diagnosis not present

## 2020-08-21 DIAGNOSIS — G7102 Facioscapulohumeral muscular dystrophy: Secondary | ICD-10-CM | POA: Diagnosis not present

## 2020-08-21 DIAGNOSIS — G20 Parkinson's disease: Secondary | ICD-10-CM

## 2020-08-21 MED ORDER — CARBIDOPA-LEVODOPA ER 25-100 MG PO TBCR: 1.0000 | EXTENDED_RELEASE_TABLET | Freq: Three times a day (TID) | ORAL | 1 refills | Status: DC

## 2020-08-21 NOTE — Progress Notes (Signed)
Reason for visit: Muscular dystrophy, gait disturbance, Parkinson's disease  Luis [name] Luis [name]. is an 64 y.o. male  History of present illness:  Luis [name] is a 64 year old right-handed white male with a history of [place] muscular dystrophy associated with significant weakness of all 4 extremities and a significant gait disorder.  The patient has chronic low back pain, he had worsening of left hip and groin pain and weakness of the left leg following an ablation procedure that was done by Dr. Brien Few.  The patient has been getting some physical therapy for this and has been on some gabapentin with some improvement.  The patient is able to walk short distances with a walker, he is somewhat limited from muscular weakness and from shortness of breath.  He has a trilogy device that he uses at nighttime but sometimes during the day few short winded.  The patient claims that his legs feel heavy.  He continues to have some low back pain but this again is improved.  He has tremors associated with his Parkinson's disease, he remains on Sinemet taking 25/100 mg tablets 3 times daily.  He comes to this office for further evaluation.  Past Medical History:  Diagnosis Date  . Bilateral foot-drop 10/19/2018  . Bursitis, trochanteric    Episodic  . Carpal tunnel syndrome on right   . Degenerative disk disease    l5-S1  . FSH (facioscapulohumeral muscular dystrophy) ([place]) 10/07/2017  . Gait abnormality 10/19/2018  . GERD (gastroesophageal reflux disease)   . Hemorrhoids    with anal fissures  . Left lateral epicondylitis   . MS (multiple sclerosis) ([place])   . Parkinson's disease ([place]) 10/19/2018    Past Surgical History:  Procedure Laterality Date  . CHOLECYSTECTOMY  2007  . HEMORRHOIDECTOMY WITH HEMORRHOID BANDING    . IR KYPHO LUMBAR INC FX REDUCE BONE BX UNI/BIL CANNULATION INC/IMAGING  01/10/2020    Family History  Problem Relation Age of Onset  . Allergies Brother   . Allergies Sister   . Heart  disease Father   . Brain cancer Mother   . Bone cancer Brother     Social history:  reports that he has never smoked. He has never used smokeless tobacco. He reports that he does not drink alcohol and does not use drugs.   No Known Allergies  Medications:  Prior to Admission medications   Medication Sig Start Date End Date Taking? Authorizing Provider  Carbidopa-Levodopa ER (SINEMET CR) 25-100 MG tablet controlled release Take 1 tablet by mouth 3 (three) times daily. 01/20/20  Yes Kathrynn Ducking, MD  Carboxymethylcellulose Sodium (REFRESH TEARS OP) Place 1 drop into both eyes 6 (six) times daily.   Yes [provider]  Docusate Calcium (STOOL SOFTENER PO) Take 2 tablets by mouth at bedtime.   Yes [provider]  doxycycline (VIBRA-TABS) 100 MG tablet Take 100 mg by mouth 2 (two) times daily.   Yes [provider]  ibuprofen (ADVIL,MOTRIN) 800 MG tablet Take 800 mg by mouth 2 (two) times daily.   Yes [provider]  oxyCODONE-acetaminophen (PERCOCET) 10-325 MG tablet Take 1 tablet by mouth every 6 (six) hours.   Yes [provider]  polyethylene glycol (GOLYTELY) 236 g solution nightly. 06/15/19  Yes [provider]  prednisoLONE acetate (PRED FORTE) 1 % ophthalmic suspension Place 1 drop into both eyes in the morning and at bedtime.  03/04/18  Yes [provider]  timolol (BETIMOL) 0.25 % ophthalmic solution Place  1 drop into both eyes daily.    Yes [provider]  timolol (TIMOPTIC) 0.5 % ophthalmic solution 1 drop daily. 01/20/20  Yes [provider]  zolpidem (AMBIEN) 10 MG tablet Take 10 mg by mouth at bedtime as needed for sleep.   Yes [provider]  gabapentin (NEURONTIN) 300 MG capsule Take 300 mg by mouth 3 (three) times daily. 01/19/20   [provider]  meloxicam (MOBIC) 15 MG tablet Take 1 tablet (15 mg total) by mouth daily. 06/01/19   Kathrynn Ducking, MD  methocarbamol  (ROBAXIN) 500 MG tablet Take 1,000 mg by mouth at bedtime as needed for muscle spasms.    [provider]  naloxegol oxalate (MOVANTIK) 25 MG TABS tablet Take 25 mg by mouth daily as needed.     [provider]    ROS:  Out of a complete 14 system review of symptoms, the patient complains only of the following symptoms, and all other reviewed systems are negative.  Walking difficulty Back pain Tremor Weakness  Blood pressure 105/68, pulse (!) 52, height 5\' 11"  (1.803 m), weight 150 lb (68 kg).  Physical Exam  General: The patient is alert and cooperative at the time of the examination.  Skin: No significant peripheral edema is noted.   Neurologic Exam  Mental status: The patient is alert and oriented x 3 at the time of the examination. The patient has apparent normal recent and remote memory, with an apparently normal attention span and concentration ability.   Cranial nerves: Facial symmetry is present. Speech is normal, no aphasia or dysarthria is noted. Extraocular movements are full. Visual fields are full.  Motor: The patient has significant weakness of the proximal muscles of the arms bilaterally, he has good strength of the intrinsic muscles of the hands bilaterally.  He has bilateral foot drops and good strength with extension at the knees, significant weakness with hip flexion bilaterally.  Sensory examination: Soft touch sensation is symmetric on the face, arms, and legs.  Coordination: The patient has significant difficulty performing heel-to-shin and finger-nose-finger bilaterally.  Gait and station: The patient requires significant assistance to stand up, once up, he can walk short distances with examiner, gait is wide-based, unsteady.  Reflexes: Deep tendon reflexes are symmetric.   Assessment/Plan:  1. Luis [name] muscular dystrophy  2.  Gait disturbance  3.  Parkinson's disease  4.  Chronic low back pain, L5 compression fracture  The patient  is doing better with his low back pain.  He continues with physical therapy twice a week.  We will continue on the Sinemet taking the 25/100 mg tablets 3 times daily.  He uses the Sinemet CR.  The patient now has a lift that we will get him into the house through the garage and a lift for the toilet.  He has a wheelchair to use at home.  He will follow-up here in 5 months.  He does not operate a motor vehicle.  I have encouraged the patient and his wife to get the Covid vaccination booster.  Jill Alexanders MD 08/21/2020 8:08 AM  [facility] [address] [place], [place] [address]  Phone [phone] Fax [phone]

## 2020-08-22 ENCOUNTER — Telehealth: Payer: Self-pay | Admitting: Neurology

## 2020-08-22 MED ORDER — CARBIDOPA-LEVODOPA ER 25-100 MG PO TBCR: 1.0000 | EXTENDED_RELEASE_TABLET | Freq: Three times a day (TID) | ORAL | 1 refills | Status: DC

## 2020-08-22 NOTE — Telephone Encounter (Signed)
Pt's wife, Katharine Look Gras called, Pt's prescription was sent to the wrong location. Carbidopa-Levodopa ER (SINEMET CR) 25-100 MG tablet controlled release at [facility]. Correct Pharmacy [facility] [address] [place], [place] [address]. [phone]. Would like a call from the nurse.

## 2020-08-22 NOTE — Telephone Encounter (Signed)
The wrong [facility] was in the computer, I have put the proper [facility] store and resent the prescription.

## 2020-09-07 ENCOUNTER — Telehealth: Payer: Self-pay | Admitting: Neurology

## 2020-09-07 NOTE — Telephone Encounter (Signed)
Verbal order for continued Home Health PT 2 week 3 and 1 week 1 ° call from PT Cherise @ [facility] [phone]  °  °  °  ° ° °

## 2020-09-07 NOTE — Telephone Encounter (Signed)
I called and gave a verbal order for physical therapy. 

## 2020-09-19 ENCOUNTER — Other Ambulatory Visit: Payer: Self-pay | Admitting: Neurology

## 2020-10-06 NOTE — Telephone Encounter (Signed)
Verbal order for continued Home Health PT 2 week 3 and 1 week 1  call from [place] @ [facility] [phone]

## 2020-10-09 ENCOUNTER — Telehealth: Payer: Self-pay | Admitting: Neurology

## 2020-10-09 NOTE — Telephone Encounter (Signed)
PT Luis [name] @ [place] left voicemail for verbal orders  For home health for pt  2 week 3 and 1 week 1

## 2020-10-10 NOTE — Telephone Encounter (Signed)
PT Cherise @ [place] left voicemail for verbal orders for pt  2 week 3 and 1 week 1  Called and ok'd the orders/Dr. Jannifer Franklin.

## 2020-10-17 ENCOUNTER — Telehealth: Payer: Self-pay | Admitting: *Deleted

## 2020-10-17 NOTE — Telephone Encounter (Signed)
Dr Jannifer Franklin signed orders for HHPT 2w3, lwl effective 10/10/20. Order faxed back to [facility] home health and hospice. Received a receipt of confirmation.

## 2020-12-07 ENCOUNTER — Telehealth: Payer: Self-pay | Admitting: Nurse Practitioner

## 2020-12-07 NOTE — Telephone Encounter (Signed)
Called patient's wife, Katharine Look, to discuss Palliative services with her and to offer to schedule visit with NP, no answer - left message with my name and call back number requesting a return call.

## 2020-12-22 ENCOUNTER — Telehealth: Payer: Self-pay | Admitting: Nurse Practitioner

## 2020-12-22 NOTE — Telephone Encounter (Signed)
Called and left message with patient's wife, Katharine Look, to offer to schedule a Palliative Consult, - left my name and call back number requesting return call.

## 2020-12-28 ENCOUNTER — Telehealth: Payer: Self-pay | Admitting: Nurse Practitioner

## 2020-12-28 NOTE — Telephone Encounter (Signed)
Returned call to patient's wife, Luis [name], and have discussed Palliative services with her and she was in agreement with scheduling visit.  I have scheduled an In-home Consult for 01/25/21 @ 11:30 AM

## 2021-01-02 ENCOUNTER — Telehealth: Payer: Self-pay | Admitting: Nurse Practitioner

## 2021-01-02 NOTE — Telephone Encounter (Signed)
Rec'd voicemail from wife, Katharine Look, yesterday and was returning her call.  Called her home number first with no answer.  I then called her cell # and left message.

## 2021-01-09 DIAGNOSIS — K219 Gastro-esophageal reflux disease without esophagitis: Secondary | ICD-10-CM | POA: Diagnosis present

## 2021-01-09 DIAGNOSIS — E785 Hyperlipidemia, unspecified: Secondary | ICD-10-CM | POA: Diagnosis present

## 2021-01-09 DIAGNOSIS — G4733 Obstructive sleep apnea (adult) (pediatric): Secondary | ICD-10-CM | POA: Insufficient documentation

## 2021-01-11 ENCOUNTER — Encounter: Payer: Self-pay | Admitting: Neurology

## 2021-01-11 ENCOUNTER — Ambulatory Visit (INDEPENDENT_AMBULATORY_CARE_PROVIDER_SITE_OTHER): Payer: Medicare Other | Admitting: Neurology

## 2021-01-11 VITALS — BP 116/74 | HR 50 | Ht 71.0 in | Wt 150.0 lb

## 2021-01-11 DIAGNOSIS — R269 Unspecified abnormalities of gait and mobility: Secondary | ICD-10-CM

## 2021-01-11 DIAGNOSIS — G7102 Facioscapulohumeral muscular dystrophy: Secondary | ICD-10-CM | POA: Diagnosis not present

## 2021-01-11 DIAGNOSIS — G20 Parkinson's disease: Secondary | ICD-10-CM | POA: Diagnosis not present

## 2021-01-11 MED ORDER — CARBIDOPA-LEVODOPA ER 25-100 MG PO TBCR: 1.5000 | EXTENDED_RELEASE_TABLET | Freq: Three times a day (TID) | ORAL | 1 refills | Status: DC

## 2021-01-11 NOTE — Patient Instructions (Signed)
We will increase the sinemet 25/100 mg tablets to 1.5 three times a day

## 2021-01-11 NOTE — Progress Notes (Signed)
Reason for visit: Parkinson's disease, [place] muscular dystrophy  Frazier E Mault [name]. is an 65 y.o. male  History of present illness:  Mr. 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  His dystrophy has progressed slowly over time, he has had increasing problems with maintaining an upright posture and walking.  He has not had any falls since last seen.  She has been through physical therapy.  He walks with a walker at this point.  He also has a history of Parkinson's disease with tremor involving the right hand, he feels that it is more difficult getting the right leg to move forward with walking.  The patient recently had a bout of shingles affecting the V1 distribution primarily on the left but slightly on the right as well.  The patient still has some low back pain issues, but the left groin pain is improved.  The patient has not had any problems with swallowing.  He does have a left hemidiaphragm issue, he uses Trilogy breathing apparatus at night.  He returns to the office today for an evaluation.  Past Medical History:  Diagnosis Date  . Bilateral foot-drop 10/19/2018  . Bursitis, trochanteric    Episodic  . Carpal tunnel syndrome on right   . Degenerative disk disease    l5-S1  . FSH (facioscapulohumeral muscular dystrophy) ([place]) 10/07/2017  . Gait abnormality 10/19/2018  . GERD (gastroesophageal reflux disease)   . Hemorrhoids    with anal fissures  . Left lateral epicondylitis   . MS (multiple sclerosis) ([place])   . Parkinson's disease ([place]) 10/19/2018    Past Surgical History:  Procedure Laterality Date  . CHOLECYSTECTOMY  2007  . HEMORRHOIDECTOMY WITH HEMORRHOID BANDING    . IR KYPHO LUMBAR INC FX REDUCE BONE BX UNI/BIL CANNULATION INC/IMAGING  01/10/2020    Family History  Problem Relation Age of Onset  . Allergies Brother   . Allergies Sister   . Heart disease Father   . Brain cancer Mother   .  Bone cancer Brother     Social history:  reports that he has never smoked. He has never used smokeless tobacco. He reports that he does not drink alcohol and does not use drugs.   No Known Allergies  Medications:  Prior to Admission medications   Medication Sig Start Date End Date Taking? Authorizing Provider  Carbidopa-Levodopa ER (SINEMET CR) 25-100 MG tablet controlled release Take 1 tablet by mouth 3 (three) times daily. 08/22/20  Yes Kathrynn Ducking, MD  Carboxymethylcellulose Sodium (REFRESH TEARS OP) Place 1 drop into both eyes 6 (six) times daily.   Yes [provider]  Docusate Calcium (STOOL SOFTENER PO) Take 2 tablets by mouth at bedtime.   Yes [provider]  doxycycline (VIBRA-TABS) 100 MG tablet Take 100 mg by mouth 2 (two) times daily.   Yes [provider]  gabapentin (NEURONTIN) 300 MG capsule Take 300 mg by mouth 3 (three) times daily. 01/19/20  Yes [provider]  ibuprofen (ADVIL,MOTRIN) 800 MG tablet Take 800 mg by mouth 2 (two) times daily.   Yes [provider]  naloxegol oxalate (MOVANTIK) 25 MG TABS tablet Take 25 mg by mouth daily as needed.    Yes [provider]  oxyCODONE-acetaminophen (PERCOCET) 10-325 MG tablet Take 1 tablet by mouth every 6 (six) hours.   Yes [provider]  polyethylene glycol (GOLYTELY) 236 g solution nightly. 06/15/19  Yes [provider]  prednisoLONE acetate (PRED FORTE) 1 % ophthalmic suspension Place 1 drop into both eyes in the morning and at bedtime.  03/04/18  Yes [provider]  timolol (BETIMOL) 0.25 % ophthalmic solution Place 1 drop into both eyes daily.    Yes [provider]  timolol (TIMOPTIC) 0.5 % ophthalmic solution 1 drop daily. 01/20/20  Yes [provider]  zolpidem (AMBIEN) 10 MG tablet Take 10 mg by mouth at bedtime as needed for sleep.   Yes [provider]    ROS:  Out of a complete 14 system review of  symptoms, the patient complains only of the following symptoms, and all other reviewed systems are negative.  Walking difficulty Right hand tremor Back pain  Blood pressure 116/74, pulse (!) 50, height 5\' 11"  (1.803 m), weight 150 lb (68 kg).  Physical Exam  General: The patient is alert and cooperative at the time of the examination.  Skin: 1+ edema noted on the right ankle.   Neurologic Exam  Mental status: The patient is alert and oriented x 3 at the time of the examination. The patient has apparent normal recent and remote memory, with an apparently normal attention span and concentration ability.   Cranial nerves: Facial symmetry is present. Speech is normal, no aphasia or dysarthria is noted. Extraocular movements are full. Visual fields are full.  Motor: The patient has significant weakness with biceps and triceps bilaterally, 4/5 strength with deltoid muscles bilaterally, minimal intrinsic muscle weakness seen bilaterally.  With the lower extremities, there is 3/5 strength with hip flexion bilaterally, 4/5 strength with knee extension and 4 -/5 strength with knee flexion bilaterally, prominent bilateral foot drops noted.  Sensory examination: Soft touch sensation is symmetric on the face, arms, and legs.  Coordination: The patient has difficulty performing finger-nose-finger and heel-to-shin bilaterally.  The patient has resting tremor involving the right upper extremity.  Gait and station: The patient can stand with assistance, once up, he can walk a short distance with examiner, he has a wide-based gait, will circumduct each leg with walking.  Reflexes: Deep tendon reflexes are symmetric.   Assessment/Plan:  1. [place] muscular dystrophy  2.  Parkinson's disease  The patient is gradually losing his ability to ambulate.  We will increase the Sinemet taking 1.5 of the 25/100 mg Sinemet CR tablets 3 times daily.  The patient will follow up here in 4 months.  Further  follow-up may be through Dr. Krista Blue or Dr. Jaynee Eagles.  The patient is followed through the muscular dystrophy clinic via Dr. Tillman Abide.  The patient is on chronic opiate therapy for his back pain.   Jill Alexanders MD 01/11/2021 8:03 AM  [facility] [address] [place], [place] [address]  Phone [phone] Fax [phone]

## 2021-01-24 ENCOUNTER — Telehealth: Payer: Self-pay | Admitting: Nurse Practitioner

## 2021-01-24 NOTE — Telephone Encounter (Signed)
Rec'd message that wife had called regarding the Palliative Consult appointment for tomorrow.  Returned call to wife and she wanted to reschedule due to patient was being treated for an eye infection and she wasn't sure if it was contagious or not.  Palliative Consult was rescheduled for 02/20/21 @ 12:30 PM

## 2021-01-25 ENCOUNTER — Other Ambulatory Visit: Payer: Self-pay | Admitting: Nurse Practitioner

## 2021-02-13 NOTE — Telephone Encounter (Signed)
Error

## 2021-02-20 ENCOUNTER — Other Ambulatory Visit: Payer: Medicare Other | Admitting: Nurse Practitioner

## 2021-02-20 ENCOUNTER — Other Ambulatory Visit: Payer: Self-pay

## 2021-02-20 ENCOUNTER — Encounter: Payer: Self-pay | Admitting: Nurse Practitioner

## 2021-02-20 DIAGNOSIS — G20 Parkinson's disease: Secondary | ICD-10-CM

## 2021-02-20 DIAGNOSIS — R269 Unspecified abnormalities of gait and mobility: Secondary | ICD-10-CM

## 2021-02-20 DIAGNOSIS — Z515 Encounter for palliative care: Secondary | ICD-10-CM

## 2021-02-20 DIAGNOSIS — R5381 Other malaise: Secondary | ICD-10-CM

## 2021-02-20 NOTE — Progress Notes (Signed)
[facility] Consult Note Telephone: [phone]  Fax: [phone]    Date of encounter: 02/20/21 PATIENT NAME: Luis Nelles O[place] Jr. [place] [address]   [phone] (home) [phone] (work) DOB: Sep 17, 1955 MRN: [id number] PRIMARY CARE PROVIDER:    [name], MD,  [place] [address] [place] [address] [phone]  RESPONSIBLE PARTY:    Contact Information     Name Relation Home Work Mobile   Middendorf,Sandra Spouse [phone]  [phone]      I met face to face with patient and family in  home. Palliative Care was asked to follow this patient by consultation request of  [name], MD to address advance care planning and complex medical decision making. This is the initial visit.   ASSESSMENT AND PLAN / RECOMMENDATIONS:   Advance Care Planning/Goals of Care: Goals include to maximize quality of life and symptom management. Our advance care planning conversation included a discussion about:    The value and importance of advance care planning  Experiences with loved ones who have been seriously ill or have died  Exploration of personal, cultural or spiritual beliefs that might influence medical decisions  Exploration of goals of care in the event of a sudden injury or illness  Identification and preparation of a healthcare agent  Review and updating or creation of an  advance directive document . Decision not to resuscitate or to de-escalate disease focused treatments due to poor prognosis. CODE STATUS: DNR  Symptom Management/Plan: 1. Advance Care Planning; DNR; will place in vynca; Hard Choice book given; will revisit case with Hospice eligibility  2. Goals of Care: Goals include to maximize quality of life and symptom management. Our advance care planning conversation included a discussion about:    The value and importance of advance care planning  Exploration of personal, cultural or  spiritual beliefs that might influence medical decisions  Exploration of goals of care in the event of a sudden injury or illness  Identification and preparation of a healthcare agent  Review and updating or creation of an advance directive document.  3. Palliative care encounter; Palliative care encounter; Palliative medicine team will continue to support patient, patient's family, and medical team. Visit consisted of counseling and education dealing with the complex and emotionally intense issues of symptom management and palliative care in the setting of serious and potentially life-threatening illness  4. Debility secondary with gait disturbance secondary to  [facility] muscular dystrophy/Parkinson disease, talked about fall risk, mobility; progressive decline, revisited Hospice eligibility as Luis. Bramblett continues to decompensate overall functionally. Continue sinemet  I spent 80 minutes providing this consultation. More than 50% of the time in this consultation was spent in counseling and care coordination.  PPS: 40%  HOSPICE ELIGIBILITY/DIAGNOSIS: TBD  Chief Complaint: Initial palliative consult for complex medical decision making  HISTORY OF PRESENT ILLNESS:  Luis [name] Luis [name]. is a 65 y.o. year old male  with multiple medical problems including [place] muscular dystrophy (,He was clinically diagnosed with FSHD in 44 (65 yo) at [facility]. Workup included NCV/EMG and muscle biopsy. 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 Small left-sided disc protrusion L1-2. There is also a small extraforaminal discprotrusion on left at L5-S1.  I called [name] Luis. Hosie to confirm palliative care initial visit with COVID  screening negative. I visited Luis and Luis. Tango in their home. Luis [name] was sitting in his lift chair. Luis Sweaney and I talked about purpose of palliative care visit. We talked about how he has been feeling today. We talked about pain, his current pain regimen including dilaudid prescribed by Dr Inda Merlin. We talked about the last time Luis [name] was independent at home. Life review completed Luis [name] talked about his retirement. We talked about past medical history. We talked about disease chronic disease progression of FSH muscular dystrophy. Luis [name] talked about when he fell and fractured his back and procedures following. Luis [name] endorses he was able to walk with a stick up until October/2021 after his ablation procedure. Luis [name] does require assistance with bathing, dressing. Luis [name] does feed himself after he sat upright and in a certain position. Luis [name] is able to feed himself but it does take a very long time to eat greater than 30 minutes. Luis [name] endorses that his weight was 200 lbs though Luis [name] endorses 225lbs and now currently 150 lbs over the last three years period appetite has declined. We talked about overall muscle mass loss with considerable muscle wasting, atrophy. Luis [name] talked aboutoccupational and physical therapist. We talked about his current walker that Luis. Manni purchased. We talked about Luis [name] not able to get out of a chair without being lifted to a standing position. With Luis. [name] does have great difficulty with ambulation as he leans forward without any upper body strength and a very wide unsteady gait. We talked about Luis. [name] daily routine. Luis [name] endorses prior to the procedure he was able to go to the grocery store, [employer] use a cart and push it for support, get everything they need and be able to drive home. Since Luis. [name] has been progressively debilitated not able to perform those jobs. We talked about medical goals of care period we talked about quality of life and  what that looks like to Luis and Luis. [name]. We talked about code status as he is a DNR and they keep a goldenrod form on the refrigerator. Placed copy in vynca. Luis and  Luis. Cawley endorses their wishes for path to follow more comfort care, minimize suffering. We revisited Hospice services through Medicare benefit. Luis. Voght endorses when Hospice came out and saw that Luis. Dembeck had trilogy machine sharing that he would not be eligible for Hospice services. explain to Luis. Sanjurjo that certain circumstances do allow trilogy machine which is about 10 hours a night for which he does sleep through soundly. Luis Fallen endorses he does nap for 2 and 3 hours a day for which he does use his trilogy machine. We talked about Medicare criteria for Hospice. We talked about what Hospice services would be provided. We talked about the difference between Palliative care and Hospice services. Luis and Luis. Miramontes were open to have case reviewed again by Hospice Physicians for eligibility. We talked about role of palliative care and plan of care. Luis. Choma showed the lift, wheelchair, raised toilet seat with a lift and other things that she uses to help care for Luis. Chambless at home. We talked at length about disease progression, quality of life, suffering and grieving process. We talked about coping strategies. We talked about caregiver stress. We talked about why Luis. Noah appears that he would be eligible for Hospice eligibility. The therapeutic  listening and emotional support provided. We talked about role of palliative care period discuss with Luis and  Luis. Vrooman will review with Hospice Physicians for eligibility and re-contact to review Hospice Physicians findings. Will then either proceed with Hospice services or schedule follow up palliative care visit based on Luis and Luis. Miu wishes. Luis and Luis. Vandoren were thankful for palliative care visit and discussion today as verbalized.    History obtained from review of EMR, discussion  with wife and Luis. Uhls.  I reviewed available labs, medications, imaging, studies and related documents from the EMR.  Records reviewed and summarized above.   ROS Full 14 system review of systems performed and negative with exception of: as per HPI.   Physical Exam: Constitutional: NAD General: frail appearing, debilitated, chronically ill EYES:   lids intact ENMT: oral mucous membranes moist CV: S1S2, RRR, right LE edema Pulmonary: poor air movement;  no increased work of breathing, no cough, room air Abdomen: soft and non tender MSK: very unstable gait, stooped posture with walker; muscle wasting; atrophy Skin: warm and dry, no rashes or wounds on visible skin Neuro:  + generalized weakness throughout,  no cognitive impairment Psych: non-anxious affect, A and O x 3  CURRENT PROBLEM LIST:  Patient Active Problem List   Diagnosis Date Noted   Gait abnormality 10/19/2018   Bilateral foot-drop 10/19/2018   Parkinson's disease ([place]) 10/19/2018   Swelling of lower leg - Right  03/06/2018   Tremor of right hand 12/19/2017   FSH (facioscapulohumeral muscular dystrophy) ([place]) 10/07/2017   Dilated cardiomyopathy ([place]) 05/02/2017   Upper airway cough syndrome 03/18/2014   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  03/17/2014   Abnormal chest xray 03/16/2014   PAST MEDICAL HISTORY:  Active Ambulatory Problems    Diagnosis Date Noted   Abnormal chest xray 03/16/2014   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  03/17/2014   Upper airway cough syndrome 03/18/2014   Dilated cardiomyopathy ([place]) 05/02/2017   FSH (facioscapulohumeral muscular dystrophy) ([place]) 10/07/2017   Tremor of right hand 12/19/2017   Swelling of lower leg - Right  03/06/2018   Gait abnormality 10/19/2018   Bilateral foot-drop 10/19/2018   Parkinson's disease ([place]) 10/19/2018   Resolved Ambulatory Problems    Diagnosis Date Noted   No Resolved Ambulatory Problems   Past Medical History:  Diagnosis Date   Bursitis, trochanteric    Carpal  tunnel syndrome on right    Degenerative disk disease    GERD (gastroesophageal reflux disease)    Hemorrhoids    Left lateral epicondylitis    MS (multiple sclerosis) ([facility])    SOCIAL HX:  Social History   Tobacco Use   Smoking status: Never   Smokeless tobacco: Never  Substance Use Topics   Alcohol use: No   FAMILY HX:  Family History  Problem Relation Age of Onset   Allergies Brother    Allergies Sister    Heart disease Father    Brain cancer Mother    Bone cancer Brother   Reviewed   ALLERGIES: No Known Allergies   PERTINENT MEDICATIONS:  Outpatient Encounter Medications as of 02/20/2021  Medication Sig   Carbidopa-Levodopa ER (SINEMET CR) 25-100 MG tablet controlled release Take 1.5 tablets by mouth 3 (three) times daily.   Carboxymethylcellulose Sodium (REFRESH TEARS OP) Place 1 drop into both eyes 6 (six) times daily.   Docusate Calcium (STOOL SOFTENER PO) Take 2 tablets by mouth at bedtime.   doxycycline (VIBRA-TABS) 100 MG tablet Take 100 mg by  mouth 2 (two) times daily.   gabapentin (NEURONTIN) 300 MG capsule Take 300 mg by mouth 3 (three) times daily.   ibuprofen (ADVIL,MOTRIN) 800 MG tablet Take 800 mg by mouth 2 (two) times daily.   naloxegol oxalate (MOVANTIK) 25 MG TABS tablet Take 25 mg by mouth daily as needed.    oxyCODONE-acetaminophen (PERCOCET) 10-325 MG tablet Take 1 tablet by mouth every 6 (six) hours.   polyethylene glycol (GOLYTELY) 236 g solution nightly.   prednisoLONE acetate (PRED FORTE) 1 % ophthalmic suspension Place 1 drop into both eyes in the morning and at bedtime.    timolol (BETIMOL) 0.25 % ophthalmic solution Place 1 drop into both eyes daily.    timolol (TIMOPTIC) 0.5 % ophthalmic solution 1 drop daily.   zolpidem (AMBIEN) 10 MG tablet Take 10 mg by mouth at bedtime as needed for sleep.   No facility-administered encounter medications on file as of 02/20/2021.   Questions and concerns were addressed. The patient/family was encouraged  to call with questions and/or concerns. My business card was provided. Provided general support and encouragement, no other unmet needs identified   Thank you for the opportunity to participate in the care of Luis. Hegner.  The palliative care team will continue to follow. Please call our office at [phone] if we can be of additional assistance.   This chart was dictated using voice recognition software.  Despite best efforts to proofread,  errors can occur which can change the documentation meaning.   Juanangel Soderholm Z Devontay Celaya, NP , MSN, ACHPN  COVID-19 PATIENT SCREENING TOOL Asked and negative response unless otherwise noted:   Have you had symptoms of covid, tested positive or been in contact with someone with symptoms/positive test in the past 5-10 days? NO

## 2021-02-22 ENCOUNTER — Telehealth: Payer: Self-pay | Admitting: Nurse Practitioner

## 2021-02-22 NOTE — Telephone Encounter (Signed)
I attempted to call Ms. Michel to update on Hospice Physicians agree with Hospice eligibility, message left with contact information

## 2021-02-23 ENCOUNTER — Telehealth: Payer: Self-pay | Admitting: Nurse Practitioner

## 2021-02-23 NOTE — Telephone Encounter (Signed)
I called Mrs. Kirchoff, updated on Hospice Physician [place] and Mrs. Venturini wishes to continue with Hospice services. Message sent to Dr Inda Merlin, and referral intake notified at [facility].

## 2021-03-20 ENCOUNTER — Telehealth: Payer: Self-pay | Admitting: Nurse Practitioner

## 2021-03-20 NOTE — Telephone Encounter (Signed)
I called Mrs. Delong. We talked about update on Mr. Vandeusen. Mrs. Watrous talked about recent discussions concerning Hospice, Mr. Krukowski care, needs, and wishes. Questions answered. Mrs. Molstad in agreement to have Hospice AV nurse recontact to schedule visit.

## 2021-04-02 ENCOUNTER — Telehealth: Payer: Self-pay | Admitting: Nurse Practitioner

## 2021-04-02 NOTE — Telephone Encounter (Signed)
Received message from Luis [name] to contact to schedule f/u PC visit. Luis [name] wish to continue discussions of medial goals of care, hospice services, further discussion of chronic disease progression, re-evaluate functional/cognitive changes, more education and counseling as Luis [name] continue to decline. Appointment scheduled. Lengthly discussion with Luis [name] about Luis [name] overall condition, expectations. Therapeutic listening, emotional support provided  Total time 25 minutes Documentation 5 minutes Phone discussion 20 minutes

## 2021-04-13 ENCOUNTER — Other Ambulatory Visit: Payer: Self-pay

## 2021-04-13 ENCOUNTER — Encounter: Payer: Self-pay | Admitting: Nurse Practitioner

## 2021-04-13 ENCOUNTER — Other Ambulatory Visit: Payer: Medicare Other | Admitting: Nurse Practitioner

## 2021-04-13 DIAGNOSIS — R269 Unspecified abnormalities of gait and mobility: Secondary | ICD-10-CM

## 2021-04-13 DIAGNOSIS — G20 Parkinson's disease: Secondary | ICD-10-CM

## 2021-04-13 DIAGNOSIS — Z515 Encounter for palliative care: Secondary | ICD-10-CM

## 2021-04-13 DIAGNOSIS — R5381 Other malaise: Secondary | ICD-10-CM

## 2021-04-13 NOTE — Progress Notes (Signed)
Luis Luis Telephone: [phone]  Fax: [phone]    Date of encounter: 04/13/21 PATIENT NAME: Luis Auxier Pettinger Jr. [place] [address]   [phone] (home) [phone] (work) DOB: [birth year] MRN: [id number] PRIMARY CARE PROVIDER:    [name], MD,  [place] [address] [place] [address] [phone]  RESPONSIBLE PARTY:    Contact Information     Name Relation Home Work Mobile   Baham,Sandra Spouse [phone]  [phone]      I met face to face with patient and wife in home. Palliative Care was asked to follow this patient by consultation request of  Luis Huddle, MD to address advance care planning and complex medical decision making. This is a follow up  ASSESSMENT AND PLAN/Recommendations  Symptom Management/Plan: 1. Advance Care Planning; DNR; Discussed at length about hospice, hospice services, what will be covered. Luis Luis wishes to continue to review what is covered, asked to be placed in writing so she is aware of what will be covered medications, trilogy and what will not. How it will work with specialists. We talked about PC vs Hospice difference. We talked about cost, Luis for PC/Hospice. Luis Luis expressed wishes to once on Hospice wishes are to move to Hospice home for passing. Luis Luis endorses he does not wish to pass at home. Luis Luis express concerns if something happens to her what will happen to Luis. Tackitt. Attempted to explain Hospice would give them support, resources that [facility] does not have. We talked about having PC RN/PC SW make a visit next week to revisit option of Hospice, what is covered, how Luis Luis would get medications, how refills would be handled and by which physician. What a RN hospice visit will look like. We talked at length about Luis Luis fears. We talked about Luis Luis brother passing away. We talked about grief, Most of PC visit therapeutic  listening, emotional support provided.   2. Goals of Care: Goals include to maximize quality of life and symptom management. Our advance care planning conversation included a discussion about:    The value and importance of advance care planning  Exploration of personal, cultural or spiritual beliefs that might influence medical decisions  Exploration of goals of care in the event of a sudden injury or illness  Identification and preparation of a healthcare agent  Review and updating or creation of an advance directive document.   3. Palliative care encounter; Palliative care encounter; Palliative medicine team will continue to support patient, patient's family, and medical team. Visit consisted of counseling and education dealing with the complex and emotionally intense issues of symptom management and palliative care in the setting of serious and potentially life-threatening illness   4. Debility secondary with gait disturbance secondary to  [facility] muscular dystrophy/Parkinson disease, talked about fall risk, mobility; progressive decline, revisited Hospice eligibility as Luis Luis continues to decompensate overall functionally. Continue sinemet  Follow up Palliative Care Visit: Palliative care will continue to follow for complex medical decision making, advance care planning, and clarification of goals. Return 4 weeks or prn.  I spent 125 minutes providing this consultation. More than 50% of the time in this consultation was spent in counseling and care coordination.  PPS: 40%  HOSPICE ELIGIBILITY/DIAGNOSIS: TBD  Chief Complaint: Follow up palliative consult for complex medical decision making  HISTORY OF PRESENT ILLNESS:  Luis Musgrave Wickey [name]. is a 65 y.o. year old male  with  multiple medical problems including [place] muscular dystrophy (,He was clinically diagnosed with FSHD in 65 (65 yo) at [facility]. Workup included NCV/EMG and muscle biopsy. 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 Small left-sided disc protrusion L1-2. There is also a small extraforaminal discprotrusion on left at L5-S1. I called Luis Luis to confirm palliative care initial visit with COVID screening negative. I visited Luis and Luis Luis in their home. Luis Luis was sitting in his lift chair. Luis Luis and I talked about purpose of palliative care visit. We talked about how he has been feeling today. Mrs Luis talked about the bill she received for co-pay from [facility] Luis. Discussed and will send email to Luis Luis. Ms. Erisman talked about concerns surrounding Hospice at [facility]. Discussed at length about hospice, hospice services, what will be covered. Luis Luis wishes to continue to review what is covered, asked to be placed in writing so she is aware of what will be covered medications, trilogy and what will not. How it will work with specialists. We talked about PC vs Hospice difference. We talked about cost, Luis for PC/Hospice. Luis. Luis expressed wishes to once on Hospice wishes are to move to Hospice home for passing. Luis. Luis endorses he does not wish to pass at home. Luis Luis express concerns if something happens to her what will happen to Luis. Luis. Attempted to explain Hospice would give them support, resources that [facility] does not have. We talked about having PC RN/PC SW make a visit next week to revisit option of Hospice, what is covered, how Luis Luis would get medications, how refills would be handled and by which physician. What a RN hospice visit will look like. We talked at length about Luis Luis fears. We talked about Luis Luis brother passing away. We talked about grief, Most of PC visit therapeutic  listening, emotional support provided. Symptoms pain controlled with Dilaudid per Luis. Luis. Slow progression, decline. Appears Luis. Luis speech has become slower. Luis. Luis asked specific questions about Hospice. Luis Luis was cooperative with assessment. Questions answered. Will refer to [facility] SW/RN for further discussions of topics of concern and attempt to transition to Hospice once Luis and Mrs Doswell comfortable.   History obtained from review of EMR, discussion with Mrs and  Luis. Luis.  I reviewed available labs, medications, imaging, studies and related documents from the EMR.  Records reviewed and summarized above.   ROS Full 14 system review of systems performed and negative with exception of: as per HPI.   Physical Exam: Constitutional: NAD General: frail appearing, thin, pleasant debilitated male EYES: Lids intact, ENMT: oral mucous membranes moist CV: S1S2, RRR, + LE edema; RT>LT Pulmonary: LCTA, no increased work of breathing, no cough, room air Abdomen: soft  MSK: unsteady wide gait with walker, difficulty with balance Skin: warm and dry Neuro:  + generalized weakness,  no cognitive impairment Psych: non-anxious affect, A and O x 3  Questions and concerns were addressed. The patient/family was encouraged to call with questions and/or concerns. Provided general support and encouragement, no other unmet needs identified   Thank you for the opportunity to participate in the care of Luis. Luis.  The palliative care team will continue to follow. Please call our  office at [phone] if we can be of additional assistance.   This chart was dictated using voice recognition software.  Despite best efforts to proofread,  errors can occur which can change the documentation meaning.   Ori Trejos Z Marcos Peloso, NP   COVID-19 PATIENT SCREENING TOOL Asked and negative response unless otherwise noted:   Have you had symptoms of covid, tested positive or been in contact with someone with  symptoms/positive test in the past 5-10 days?  NO

## 2021-05-01 ENCOUNTER — Encounter: Payer: Self-pay | Admitting: Nurse Practitioner

## 2021-05-01 ENCOUNTER — Other Ambulatory Visit: Payer: Medicare Other | Admitting: Nurse Practitioner

## 2021-05-01 ENCOUNTER — Other Ambulatory Visit: Payer: Self-pay

## 2021-05-01 DIAGNOSIS — Z515 Encounter for palliative care: Secondary | ICD-10-CM

## 2021-05-01 DIAGNOSIS — R630 Anorexia: Secondary | ICD-10-CM

## 2021-05-01 DIAGNOSIS — R5381 Other malaise: Secondary | ICD-10-CM

## 2021-05-01 NOTE — Progress Notes (Signed)
[facility] Palliative Care Consult Note Telephone: [phone]  Fax: [phone]    Date of encounter: 05/01/21 5:08 PM PATIENT NAME: Balinda Quails Dues Jr. [place] [address]   [id number] (home) [phone] (work) DOB: 02-09-1956 MRN: [id number] PRIMARY CARE PROVIDER:    [name], MD,  [place] [address] [place] [address] [phone]  RESPONSIBLE PARTY:    Contact Information     Name Relation Home Work Mobile   Lindor,Sandra Spouse [phone]  [phone]      I met face to face with patient and family in home. Palliative Care was asked to follow this patient by consultation request of  [name], MD to address advance care planning and complex medical decision making. This is a follow up visit.                                  ASSESSMENT AND PLAN / RECOMMENDATIONS:  Symptom Management/Plan: 1. Advance Care Planning; DNR; will place in vynca; [place] book given; will revisit case with Hospice eligibility  2. Debility secondary with gait disturbance secondary to  [facility] muscular dystrophy/Parkinson disease, talked about fall risk, mobility; progressive decline, revisited Hospice eligibility as Mr. Feimster continues to decompensate overall functionally. Continue sinemet. Mr. Deremer is Hospice eligible per [facility] though after lengthy discussion with Mr and Mrs. Kasprzak, mentally not ready for Hospice wishes to continue to [facility]. Mr and Mrs Eggert verbalize understanding at any point in time may contact PC to transition to Hospice services.   3. Anorexia; decreased appetite secondary to [facility] muscular dystrophy/Parkinson disease. Mr. Shakespeare endorses continue to have breakfast, dinner then snack for lunch. Mr. Dumm endorses he does not have a taste for food. We talked about overall weight, loss, nutrition education completed.   4. Chronic pain secondary to [facility] muscular dystrophy/Parkinson disease managed by Dr  Inda Merlin with Dilaudid with effective relief. We talked about non-medication modalities.    5. Palliative care encounter; Palliative care encounter; Palliative medicine team will continue to support patient, patient's family, and medical team. Visit consisted of counseling and education dealing with the complex and emotionally intense issues of symptom management and palliative care in the setting of serious and potentially life-threatening illness  Follow up Palliative Care Visit: Palliative care will continue to follow for complex medical decision making, advance care planning, and clarification of goals. Return 4 weeks or prn.  I spent 98  minutes providing this consultation. More than 50% of the time in this consultation was spent in counseling and care coordination. PPS: 40%  Chief Complaint: Follow up palliative consult for complex medical decision making   HISTORY OF PRESENT ILLNESS:  Savino Whisenant Pester [name]. is a 65 y.o. year old male  with multiple medical problems including [place] muscular dystrophy (,He was clinically diagnosed with FSHD in 79 (65 yo) at [facility]. Workup included NCV/EMG and muscle biopsy. 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 Small left-sided disc protrusion L1-2. There is also a small extraforaminal discprotrusion on left at L5-S1. I called [name] Mrs. Borawski to confirm palliative care initial visit with COVID screening negative. I visited Mr and Mrs. Segreto  in their home. Mr Shishido was sitting in his lift chair. Mr Magaw and I talked about purpose of palliative care visit. We talked about how he has been feeling today. Mr Ercole and Mrs. Lanius talked about gerd type symptoms for which Dr  Inda Merlin prescribed carafate which has helped feelings, sensation. WE talked about symptoms of pain, chronic with overall management. Dr Inda Merlin, primary does pain management with dilaudid with effective relief. We talked about appetite. Mr. Michael endorses continue to have breakfast, dinner then snack for lunch. Mr. Burruel endorses he does not have a taste for food. We talked about overall weight, loss, nutrition education completed. Mr and Mrs. Cappella talked about information they learned from Hospice, Dr Karie Georges did contact with conference call with Pincus Large RN/Hospice to answer questions, then referred to Hospice billing, Orvilla Cornwall who also answered questions. Mr and Mrs Audi and I talked about difference between hospice and pc. We talked about role pc in poc. We talked about what hospice would look like in comparison to pc visits. We talked about at this time, Mr. Kercher wishes to remain on pc. We talked about quality of life. We talked about his current functional abilities. We talked about disease progression, expectations and concerns. Questions answered. Therapeutic listening, emotional support provided. Mr and Mrs. Slingerland endorses pc visit was helpful, wish to continue pc.   History obtained from review of EMR, discussion with Mrs and Mr. Louvier.  I reviewed available labs, medications, imaging, studies and related documents from the EMR.  Records reviewed and summarized above.   ROS Full 14 system review of systems performed and negative with exception of: as per HPI.   Physical Exam: Constitutional: NAD General: frail appearing, thin, debilitated pleasant mae EYES: lids intact ENMT: oral mucous membranes moist CV: S1S2, RRR, RLE edema Pulmonary: LCTA, no increased work of breathing, no cough, room air Abdomen: normo-active BS + 4 quadrants, soft and non tender MSK: wide gait, unsteady with walker Skin: warm and dry Neuro:  + generalized weakness,  no cognitive impairment Psych:  non-anxious affect, A and O x 3  Questions and concerns were addressed. The patient/family was encouraged to call with questions and/or concerns. My contact information was provided. Provided general support and encouragement, no other unmet needs identified   Thank you for the opportunity to participate in the care of Mr. Southard.  The palliative care team will continue to follow. Please call our office at [phone] if we can be of additional assistance.   This chart was dictated using voice recognition software.  Despite best efforts to proofread,  errors can occur which can change the documentation meaning.   Dominika Losey Z Adarius Tigges, NP   COVID-19 PATIENT SCREENING TOOL Asked and negative response unless otherwise noted:   Have you had symptoms of covid, tested positive or been in contact with someone with symptoms/positive test in the past 5-10 days?  NO

## 2021-05-31 ENCOUNTER — Ambulatory Visit (INDEPENDENT_AMBULATORY_CARE_PROVIDER_SITE_OTHER): Payer: Medicare Other | Admitting: Neurology

## 2021-05-31 ENCOUNTER — Encounter: Payer: Self-pay | Admitting: Neurology

## 2021-05-31 VITALS — BP 119/76 | HR 56 | Ht 71.0 in

## 2021-05-31 DIAGNOSIS — G20 Parkinson's disease: Secondary | ICD-10-CM | POA: Diagnosis not present

## 2021-05-31 DIAGNOSIS — M2041 Other hammer toe(s) (acquired), right foot: Secondary | ICD-10-CM | POA: Diagnosis not present

## 2021-05-31 DIAGNOSIS — R269 Unspecified abnormalities of gait and mobility: Secondary | ICD-10-CM | POA: Diagnosis not present

## 2021-05-31 DIAGNOSIS — G7102 Facioscapulohumeral muscular dystrophy: Secondary | ICD-10-CM | POA: Diagnosis not present

## 2021-05-31 DIAGNOSIS — M2042 Other hammer toe(s) (acquired), left foot: Secondary | ICD-10-CM

## 2021-05-31 MED ORDER — CARBIDOPA-LEVODOPA ER 50-200 MG PO TBCR
1.0000 | EXTENDED_RELEASE_TABLET | Freq: Three times a day (TID) | ORAL | 3 refills | Status: DC
Start: 2021-05-31 — End: 2021-10-18

## 2021-05-31 MED ORDER — GABAPENTIN 600 MG PO TABS
600.0000 mg | ORAL_TABLET | Freq: Three times a day (TID) | ORAL | 3 refills | Status: DC
Start: 2021-05-31 — End: 2021-10-18

## 2021-05-31 NOTE — Progress Notes (Signed)
Reason for visit: Parkinson's disease, [place] muscular dystrophy  Luis [name] Luis [name]. is an 65 y.o. male  History of present illness:  Mr. Luis [name] is a 65 year old right-handed white male with a history of [place] muscular dystrophy associated with a gradually progressive gait problem.  He is able to walk with a cane short distances.  He has not had any falls since last seen.  He has a history of Parkinson's disease that is associated with a prominent resting tremor on the right arm and some head and neck tremor.  The patient remains on Sinemet taking the 25/100 mg tablet, 1.5 tablets 3 times daily.  He tolerates this well.  He denies any problems with swallowing or choking.  He is on chronic opioid therapy for back pain.  He has had a lot of burning sensations around the tailbone area.  This may be worse when he is upright and leaning up against a wall.  His back feels stiff at times.  He is sleeping fairly well, but if he rolls flat on his back the back pain ensues.  He has had some problems with constipation on the opiate medications, he is taking docusate and Metamucil.  The patient has also had some problems with curling of the toes on both feet.  He has a left hemidiaphragm paralysis, he is on a Trilogy machine at night.  Past Medical History:  Diagnosis Date   Bilateral foot-drop 10/19/2018   Bursitis, trochanteric    Episodic   Carpal tunnel syndrome on right    Degenerative disk disease    l5-S1   FSH (facioscapulohumeral muscular dystrophy) ([place]) 10/07/2017   Gait abnormality 10/19/2018   GERD (gastroesophageal reflux disease)    Hemorrhoids    with anal fissures   Left lateral epicondylitis    MS (multiple sclerosis) (HCC)    Parkinson's disease ([place]) 10/19/2018    Past Surgical History:  Procedure Laterality Date   CHOLECYSTECTOMY  2007   HEMORRHOIDECTOMY WITH HEMORRHOID BANDING     IR KYPHO LUMBAR INC FX REDUCE BONE BX UNI/BIL CANNULATION INC/IMAGING  01/10/2020    Family History   Problem Relation Age of Onset   Allergies Brother    Allergies Sister    Heart disease Father    Brain cancer Mother    Bone cancer Brother     Social history:  reports that he has never smoked. He has never used smokeless tobacco. He reports that he does not drink alcohol and does not use drugs.   No Known Allergies  Medications:  Prior to Admission medications   Medication Sig Start Date End Date Taking? Authorizing Provider  Carbidopa-Levodopa ER (SINEMET CR) 25-100 MG tablet controlled release Take 1.5 tablets by mouth 3 (three) times daily. 01/11/21   Kathrynn Ducking, MD  Carboxymethylcellulose Sodium (REFRESH TEARS OP) Place 1 drop into both eyes 6 (six) times daily.    [provider]  Docusate Calcium (STOOL SOFTENER PO) Take 2 tablets by mouth at bedtime.    [provider]  doxycycline (VIBRA-TABS) 100 MG tablet Take 100 mg by mouth 2 (two) times daily.    [provider]  gabapentin (NEURONTIN) 300 MG capsule Take 300 mg by mouth 3 (three) times daily. 01/19/20   [provider]  ibuprofen (ADVIL,MOTRIN) 800 MG tablet Take 800 mg by mouth 2 (two) times daily.    [provider]  naloxegol oxalate (MOVANTIK) 25 MG TABS tablet Take 25 mg by mouth daily as needed.  [provider]  oxyCODONE-acetaminophen (PERCOCET) 10-325 MG tablet Take 1 tablet by mouth every 6 (six) hours.    [provider]  polyethylene glycol (GOLYTELY) 236 g solution nightly. 06/15/19   [provider]  prednisoLONE acetate (PRED FORTE) 1 % ophthalmic suspension Place 1 drop into both eyes in the morning and at bedtime.  03/04/18   [provider]  timolol (BETIMOL) 0.25 % ophthalmic solution Place 1 drop into both eyes daily.     [provider]  timolol (TIMOPTIC) 0.5 % ophthalmic solution 1 drop daily. 01/20/20   [provider]  zolpidem (AMBIEN) 10 MG tablet Take 10 mg by mouth at bedtime as needed for  sleep.    [provider]    ROS:  Out of a complete 14 system review of symptoms, the patient complains only of the following symptoms, and all other reviewed systems are negative.  Walking difficulty Constipation Muscle weakness  Blood pressure 119/76, pulse (!) 56, height 5\' 11"  (1.803 m).  Physical Exam  General: The patient is alert and cooperative at the time of the examination.  Skin: No significant peripheral edema is noted.   Neurologic Exam  Mental status: The patient is alert and oriented x 3 at the time of the examination. The patient has apparent normal recent and remote memory, with an apparently normal attention span and concentration ability.   Cranial nerves: Facial symmetry is present. Speech is normal, no aphasia or dysarthria is noted. Extraocular movements are full. Visual fields are full.  Motor: The patient has severe weakness of the triceps muscles bilaterally, 4 -/5 strength with biceps bilaterally and with deltoids bilaterally.  Some weakness of intrinsic muscles of the hands.  He has prominent bilateral foot drop, he has near normal strength with knee extension bilaterally, 4/5 strength with hamstring muscles bilaterally and with hip flexion bilaterally.  Sensory examination: Soft touch sensation is symmetric on the face, arms, and legs.  Coordination: The patient has difficulty performing heel-to-shin and finger-nose-finger bilaterally.  The patient has a prominent resting tremor with the right upper extremity.  Gait and station: The patient was in a wheelchair today, he did not bring his walking stick, he was not ambulated.  Reflexes: Deep tendon reflexes are symmetric.   Assessment/Plan:  FSH muscular dystrophy  2.  Parkinson's disease  The Sinemet dose will be increased to the 50/200 mg tablets, 1 tablet 3 times daily.  He is having a lot of back pain issues.  The gabapentin dose will be increased, he will be given a 600 mg tablet  taking 1 tablet 3 times daily, he may call for the dose depending upon his needs taking only half a tablet instead of a full tablet.  I will make a referral to a podiatrist for his hammertoe issues.  He will follow up here in 4 months, he will be seen by Dr. Krista Blue.  Jill Alexanders MD 05/31/2021 7:26 AM  [facility] [address] [place], [place] [address]  Phone [phone] Fax [phone]

## 2021-06-15 ENCOUNTER — Encounter: Payer: Self-pay | Admitting: Nurse Practitioner

## 2021-06-15 ENCOUNTER — Other Ambulatory Visit: Payer: Self-pay

## 2021-06-15 ENCOUNTER — Other Ambulatory Visit: Payer: Medicare Other | Admitting: Nurse Practitioner

## 2021-06-15 DIAGNOSIS — R5381 Other malaise: Secondary | ICD-10-CM

## 2021-06-15 DIAGNOSIS — G20 Parkinson's disease: Secondary | ICD-10-CM

## 2021-06-15 DIAGNOSIS — Z515 Encounter for palliative care: Secondary | ICD-10-CM

## 2021-06-15 NOTE — Progress Notes (Signed)
[facility] [facility] Palliative Care Consult Note Telephone: [phone]  Fax: [phone]    Date of encounter: 06/15/21 10:22 PM PATIENT NAME: Luis Quails Pennie Jr. [place] [address]   [phone] (home) [phone] (work) DOB: 1956-01-10 MRN: [id number] PRIMARY CARE PROVIDER:    [name], MD,  [place] [address] [place] [address] [phone]  RESPONSIBLE PARTY:    Contact Information     Name Relation Home Work Mobile   Neth,Sandra Spouse [phone]  [phone]      I met face to face with patient and family in home. Palliative Care was asked to follow this patient by consultation request of  [name], MD to address advance care planning and complex medical decision making. This is a follow up visit.                                  ASSESSMENT AND PLAN / RECOMMENDATIONS: Symptom Management/Plan: 1. Advance Care Planning; DNR; will place in vynca;   2. Debility secondary with gait disturbance secondary to  [facility] muscular dystrophy/Parkinson disease, talked about fall risk, mobility; progressive decline, revisited Hospice eligibility as Luis [name] continues to decompensate overall functionally. Continue sinemet. Luis [name] is Hospice eligible per [facility] though Luis [name] not ready at this time   3. Chronic pain secondary to [facility] muscular dystrophy/Parkinson disease managed by Dr Inda Merlin with Dilaudid with effective relief.    4. Palliative care encounter; Palliative care encounter; Palliative medicine team will continue to support patient, patient's family, and medical team. Visit consisted of counseling and education dealing with the complex and emotionally intense issues of symptom management and palliative care in the setting of serious and potentially life-threatening illness   Follow up Palliative Care Visit: Palliative care will continue to follow for complex medical decision making, advance care  planning, and clarification of goals. Return 6 weeks or prn.  I spent 98 minutes providing this consultation. More than 50% of the time in this consultation was spent in counseling and care coordination.  PPS: 40%  Chief Complaint: Follow up palliative consult for complex medical decision making  HISTORY OF PRESENT ILLNESS:  Luis Mittag Kincade [name]. is a 65 y.o. year old male  with multiple medical problems including FSH muscular dystrophy (,He was clinically diagnosed with FSHD in 10 (65 yo) at [facility]. Workup included NCV/EMG and muscle biopsy. 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 Small left-sided disc protrusion L1-2. There is also a small extraforaminal discprotrusion on left at L5-S1.I visited Luis [name] in their home. Luis [name] was sitting in his recliner. We talked about updated with Dr Inda Merlin, Dr Jannifer Franklin, symptoms including pain, shortness of breath with exertion/fatigue with weakness. We talked about trilogy. We talked about bowels, appetite. We talked extensively about PMH, events that led to Luis [name] current condition. We talked about medications with Dr Jannifer Franklin increasing sinemet and gabapentin. We talked extensively about family dynamics, expectations with progression of disease, planning, grieving with loss of independence, grieving disease progression, grieving recent loss of Luis. Begnaud brother. We talked extensively about caregiver stress, fatigue. Coping strategies. Medical goals reviewed. We talked about f/u  pc visit, scheduled with Luis [name] agreement. Most PC visit counseling, supportive. Therapeutic listening,   History obtained from review of EMR, discussion with Luis and  Luis.  [name].  I reviewed available labs, medications, imaging, studies and related documents from the EMR.  Records reviewed and summarized above.   ROS Full 10 system review of systems performed and negative with exception of: as per HPI.   Physical Exam: Constitutional: NAD General: chronically ill, debilitated, pleasant male EYES: lids intact ENMT: oral mucous membranes moist CV: S1S2, RRR Pulmonary: LCTA, no increased work of breathing, no cough, room air Abdomen: normo-active BS + 4 quadrants, soft and non tender MSK: ambulatory with walker Skin: warm and dry Neuro:  + generalized weakness,  no cognitive impairment Psych: non-anxious affect, A and O x 3  Questions and concerns were addressed. The patient/family was encouraged to call with questions and/or concerns. My business card was provided. Provided general support and encouragement, no other unmet needs identified   Thank you for the opportunity to participate in the care of Luis [name].  The palliative care team will continue to follow. Please call our office at [phone] if we can be of additional assistance.   This chart was dictated using voice recognition software.  Despite best efforts to proofread,  errors can occur which can change the documentation meaning.   Crista Nuon Z Mable Lashley, NP   COVID-19 PATIENT SCREENING TOOL Asked and negative response unless otherwise noted:   Have you had symptoms of covid, tested positive or been in contact with someone with symptoms/positive test in the past 5-10 days? NO

## 2021-06-21 ENCOUNTER — Ambulatory Visit (INDEPENDENT_AMBULATORY_CARE_PROVIDER_SITE_OTHER): Payer: Medicare Other

## 2021-06-21 ENCOUNTER — Encounter: Payer: Self-pay | Admitting: Podiatry

## 2021-06-21 ENCOUNTER — Other Ambulatory Visit: Payer: Self-pay

## 2021-06-21 ENCOUNTER — Ambulatory Visit (INDEPENDENT_AMBULATORY_CARE_PROVIDER_SITE_OTHER): Payer: Medicare Other | Admitting: Podiatry

## 2021-06-21 DIAGNOSIS — M2042 Other hammer toe(s) (acquired), left foot: Secondary | ICD-10-CM | POA: Diagnosis not present

## 2021-06-21 DIAGNOSIS — M21372 Foot drop, left foot: Secondary | ICD-10-CM

## 2021-06-21 DIAGNOSIS — M21371 Foot drop, right foot: Secondary | ICD-10-CM | POA: Diagnosis not present

## 2021-06-21 DIAGNOSIS — R2681 Unsteadiness on feet: Secondary | ICD-10-CM

## 2021-06-21 DIAGNOSIS — M2041 Other hammer toe(s) (acquired), right foot: Secondary | ICD-10-CM | POA: Diagnosis not present

## 2021-06-21 NOTE — Progress Notes (Signed)
Subjective:   Patient ID: Luis Quails Selk [name]., male   DOB: 65 y.o.   MRN: [id number]   HPI Patient presents with caregiver with long-term Parkinson's muscular dystrophy with digital deformities and having to either use wheelchair or walker with doctor referring him concerned about the structure of these toes and whether or not anything can be done to help.  Patient does not smoke and is not active due to his condition   Review of Systems  All other systems reviewed and are negative.      Objective:  Physical Exam Vitals and nursing note reviewed.  Constitutional:      Appearance: He is well-developed.  Pulmonary:     Effort: Pulmonary effort is normal.  Musculoskeletal:        General: Normal range of motion.  Skin:    General: Skin is warm.  Neurological:     Mental Status: He is alert.    Neurovascular status moderately diminished with diminished muscle strength bilateral diminished sharp dull vibratory with digital deformities of the lesser digits bilateral with no ulcerations currently moderate keratotic tissue formation and foot drop bilateral along with gait instability     Assessment:  Very difficult condition with severe systemic condition making local treatment more difficult with long-term gait instability and digital deformities but no indications of ulceration H     Plan:  NP x-rays reviewed condition discussed with caregiver and patient.  I do not recommend aggressive digital fusions and less they were to breakdown and I did discussed braces that we could attempt but he has tried braces in the past without success and I do not think they will be of great benefit to him at the current time  X-rays indicate that there is digital deformities lesser digit osteoporosis secondary to diminished weightbearing forces

## 2021-08-01 ENCOUNTER — Telehealth: Payer: Self-pay

## 2021-08-01 NOTE — Telephone Encounter (Signed)
345 pm.  Request received from [place], NP to cancel scheduled appointment on 12/16 with patient due to a scheduling conflict.  If patient and wife are agreeable, visit can be rescheduled with this RN.  No answer,  message has been left requesting call back.

## 2021-08-01 NOTE — Telephone Encounter (Signed)
425 pm.  Incoming call from wife [place].  Visit is scheduled for December 6 @ 9 am.  Wife aware of cancellation with Hanley Ben, NP

## 2021-08-07 ENCOUNTER — Other Ambulatory Visit: Payer: Self-pay

## 2021-08-07 ENCOUNTER — Telehealth: Payer: Self-pay | Admitting: Nurse Practitioner

## 2021-08-07 ENCOUNTER — Other Ambulatory Visit: Payer: Medicare Other

## 2021-08-07 VITALS — BP 92/54 | HR 60 | Temp 97.7°F

## 2021-08-07 DIAGNOSIS — Z515 Encounter for palliative care: Secondary | ICD-10-CM

## 2021-08-07 NOTE — Telephone Encounter (Signed)
Attempted to contact patient's wife Katharine Look, to schedule a Palliative f/u visit - no answer, left VM requesting a return call to schedule visit.

## 2021-08-07 NOTE — Progress Notes (Signed)
PATIENT NAME: Luis E Wease Jr. DOB: 1956-04-15 MRN: [id number]  PRIMARY CARE PROVIDER: [name], MD  RESPONSIBLE PARTY:  Acct ID - Guarantor Home Phone Work Phone Relationship Acct Type  [id number] - [name],Luis E [phone] Self P/F     [address] [address], [place], [place] [address]    PLAN OF CARE and INTERVENTIONS:               1.  GOALS OF CARE/ ADVANCE CARE PLANNING:  Patient desires to remain home under the care of his wife.               2.  PATIENT/CAREGIVER EDUCATION:  Private caregivers for respite.               4. PERSONAL EMERGENCY PLAN:  Activate 911 for emergencies.                5.  DISEASE STATUS:  Joint visit completed with patient and wife.  Wife provided extensive medical history.   Debility:  Patient endorses no recent falls.  Must use a rolling walker for safe ambulation.  Remains a high risk for falls.  Discussed safety and options for assistance should patient sustain a fall.  Wife aware she can activate 911 or contact the fire department for assistance.  Patient has a mobile scooter that he is able to use outside on occasion.  Wife noted patient needs a taller walker that will allow the patient to stand straight to relieve some stress from his back.  She has made multiple attempts to obtain a new walker but this has been futile. Patient and wife are satisfied with current walker and patient will stand against the wall to stretch his back.  Patient is only able to sit in a chair for 1 hour before needing to stand and stretch.  Pain:  Patient endorses pain to back at "40+".  He has not taken his dilaudid for this am but states he will after my visit is completed.  Wife states patient is taking dilaudid scheduled but stated it does not last as long as the percocet did.  Patient continues to work with Dr. Inda Merlin for pain management.  Patient endorses goal is to manage pain as best as possible while allowing for the best quality of life.   Respiratory:  Patient endorses  shortness of breath with exertion.  He is using his trilogy machine nightly with the assistance of his wife.  Frequent adjustments are needed to the mask due to muscle loss in the face.   Tremors:  Right hand tremors present.  Wife reports recent adjustments to Sinemet.   Caregiver Fatigue:  Wife discussed the challenges of being a primary caregiver.  She has one son but he does not live locally.  There is really no outside help in place.  Discussed the use of hired private caregivers to provide some respite for wife.  She is not interested in this as an option as she has heard negative stories about private aides.  We discussed sometimes you do have "weed through" aides until you find the right match for patient.  Wife is not interested in considering this as an option at this point.    HISTORY OF PRESENT ILLNESS:  Luis Santosuosso Karapetyan [name]. is a 64 y.o. year old male  with multiple medical problems including FSH muscular dystrophy, (he was clinically diagnosed with FSHD in 38 (65 yo) at [facility] Workup included NCV/EMG and muscle biopsy. 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  CODE STATUS: DNR ADVANCED DIRECTIVES: No MOST FORM: No PPS: 40%   PHYSICAL EXAM:   VITALS: Today's Vitals   08/07/21 0907  BP: (!) 92/54  Pulse: 60  Temp: 97.7 F (36.5 C)  SpO2: 98%  PainSc: 10-Worst pain ever  PainLoc: Back    LUNGS: clear to auscultation  CARDIAC: Cor RRR}  EXTREMITIES: 1+ edema present to right ankle SKIN: Skin color, texture, turgor normal. No rashes or lesions or normal  NEURO: positive for coordination problems, gait problems, and  tremors       Lorenza Burton, RN

## 2021-08-08 ENCOUNTER — Telehealth: Payer: Self-pay | Admitting: Nurse Practitioner

## 2021-08-08 NOTE — Telephone Encounter (Signed)
Rec'd return call from patient's wife and have scheduled a Palliative f/u visit for 10/09/21 @ 11 AM.

## 2021-10-01 ENCOUNTER — Ambulatory Visit: Payer: Medicare Other | Admitting: Neurology

## 2021-10-09 ENCOUNTER — Other Ambulatory Visit: Payer: Medicare Other | Admitting: Nurse Practitioner

## 2021-10-18 ENCOUNTER — Encounter: Payer: Self-pay | Admitting: Neurology

## 2021-10-18 ENCOUNTER — Ambulatory Visit (INDEPENDENT_AMBULATORY_CARE_PROVIDER_SITE_OTHER): Payer: Medicare Other | Admitting: Neurology

## 2021-10-18 VITALS — BP 114/73 | HR 77

## 2021-10-18 DIAGNOSIS — G20 Parkinson's disease: Secondary | ICD-10-CM | POA: Diagnosis not present

## 2021-10-18 DIAGNOSIS — G8929 Other chronic pain: Secondary | ICD-10-CM | POA: Diagnosis not present

## 2021-10-18 DIAGNOSIS — R269 Unspecified abnormalities of gait and mobility: Secondary | ICD-10-CM | POA: Diagnosis not present

## 2021-10-18 DIAGNOSIS — G7102 Facioscapulohumeral muscular dystrophy: Secondary | ICD-10-CM

## 2021-10-18 DIAGNOSIS — G20A1 Parkinson's disease without dyskinesia, without mention of fluctuations: Secondary | ICD-10-CM

## 2021-10-18 DIAGNOSIS — K5909 Other constipation: Secondary | ICD-10-CM

## 2021-10-18 MED ORDER — GABAPENTIN 600 MG PO TABS: 600.0000 mg | ORAL_TABLET | Freq: Four times a day (QID) | ORAL | 4 refills | Status: DC

## 2021-10-18 MED ORDER — LACTULOSE 10 GM/15ML PO SOLN: 10.0000 g | ORAL | 11 refills | Status: DC | PRN

## 2021-10-18 MED ORDER — CARBIDOPA-LEVODOPA ER 50-200 MG PO TBCR: 1.0000 | EXTENDED_RELEASE_TABLET | Freq: Three times a day (TID) | ORAL | 4 refills | Status: DC

## 2021-10-18 NOTE — Progress Notes (Signed)
Chief Complaint  Patient presents with   Follow-up    Rm 14, with wife Doing well with increased Sinemet dose and Gabapentin, today c/o new pain in right hip, states started 3-4 months ago, pain is constant       ASSESSMENT AND PLAN  Luis Schadt Doby [name]. is a 66 y.o. male   Parkinson's disease  Has been followed by Dr. Jannifer Franklin in the past, was taking immediate release Sinemet 25/100 mg tablets, but in September 2020 he developed dizziness, lightheadedness, after immediate release of Sinemet, has been changed to CR, on slow titrating dose, was changed to Sinemet 50/200 CR 1 tablet 3 times a day since September 2022, seems to tolerated better, I have advised patient and his wife to move up his second dose of Sinemet, so he will take it at 8 AM, 2 PM, 9 PM  Chronic severe neuropathic pain  I will increase his gabapentin from 600 mg 3 times daily to 1 extra dose as needed  He is also getting Dilaudid 8 mg 4 times a day from his primary care physician Dr. Inda Merlin  Genetically confirmed FSHD, followed up by Dr. [name] at [facility] clinic dilated cardiomyopathy with reduced EF (41%),  mild L CTS,  diaphragmatic paralysis on the left,  He was clinically diagnosed with FSHD in 45 (66 yo) at [facility]. Workup included NCV/EMG and muscle biopsy. 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  DIAGNOSTIC DATA (LABS, IMAGING, TESTING) - I reviewed patient records, labs, notes, testing and imaging myself where available.   MEDICAL HISTORY:  Luis Jacobsen Barra [name]., is a 66 year old male, accompanied by his wife, to follow-up for Parkinson's disease, FSH muscular dystrophy, his primary care physician is Dr. 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  He  had genetically confirmed FSHD, dilated cardiomyopathy with reduced EF (41%), mild L CTS, diaphragmatic paralysis on the left, and Parkinson's disease. He was clinically diagnosed with FSHD in 72 (66 yo) at [facility]. Workup included NCV/EMG and muscle biopsy. 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  He is followed up by Dr. [name] at [facility] every 6 months  He also had a gradual onset right hand tremor, was diagnosed with Parkinson's disease by Dr. Jannifer Franklin, he was on regular form of Sinemet 25/100 mg in the past, but developed significant dizziness after each dose, has been switched to current Sinemet CR 50/200 mg 3 times daily, taking it at 8 AM, 4 PM, and bedtime  In addition, he suffered a significant joints, diffuse muscle achy pain, is getting Dilaudid 8 mg 4 times a day from his primary care physician Dr. [name]  Patient and his wife strive to keep him active, their daily routine involving frequent walking short distances walker, standing against the wall,  He has began palliative care who comes to their home. Per the patient this is to decrease the number of visits he has to travel to.  He has continued to have significant pain and weakness of his low back and LLE since he underwent a nerve ablation at L3-4 performed October 2021.   For mobility, he is continuing to use a walker all the time. He also has a PWC at home, which they report they are working with [employer] to  continue to adjust the seat for comfort.   He is using Trilogy machine is working well. He continues to have some shortness of breath, especially with activity. He wears his Trilogy every night. Will wear it rarely during the day when taking a nap or when symptomatic.   PHYSICAL EXAM:   Vitals:   10/18/21 1132  BP: 114/73  Pulse: 77   Not recorded     There is no height or weight on file to calculate BMI.  PHYSICAL EXAMNIATION:  Gen: NAD, conversant, well  nourised, well groomed                     Cardiovascular: Regular rate rhythm, no peripheral edema, warm, nontender. Eyes: Conjunctivae clear without exudates or hemorrhage Neck: Supple, no carotid bruits. Pulmonary: Clear to auscultation bilaterally   NEUROLOGICAL EXAM:  MENTAL STATUS: Speech/cognition: Awake, alert, oriented to history taking and casual conversation   CRANIAL NERVES: CN II: Visual fields are full to confrontation. Pupils are round equal and briskly reactive to light. CN III, IV, VI: extraocular movement are normal. No ptosis. CN V: Facial sensation is intact to light touch CN VII: Moderate to severe bilateral eye closure, cheek puff weakness CN VIII: Hearing is normal to causal conversation. CN IX, X: Phonation is normal. CN XI: Head turning and shoulder shrug are intact  MOTOR: Significant atrophy of bilateral proximal arm muscles, especially bilateral deltoid, biceps, antigravity movement of bilateral proximal upper extremity muscles, left slightly weaker than the right, fairly normal bilateral handgrip, wrist extension, wrist flexion  Antigravity movement of bilateral lower extremity muscles, bilateral hip flexion, knee extension, knee flexion, trace movement of bilateral ankle dorsiflexion,  REFLEXES: Reflexes are areflexia  SENSORY: Intact to light touch,  COORDINATION: There is no trunk or limb dysmetria noted.  GAIT/STANCE: Deferred  REVIEW OF SYSTEMS:  Full 14 system review of systems performed and notable only for as above All other review of systems were negative.   ALLERGIES: No Known Allergies  HOME MEDICATIONS: Current Outpatient Medications  Medication Sig Dispense Refill   carbidopa-levodopa (SINEMET CR) 50-200 MG tablet Take 1 tablet by mouth in the morning, at noon, and at bedtime. 270 tablet 3   Carboxymethylcellulose Sodium (REFRESH TEARS OP) Place 1 drop into both eyes 6 (six) times daily.     Docusate Calcium (STOOL SOFTENER  PO) Take 2 tablets by mouth at bedtime.     doxycycline (VIBRA-TABS) 100 MG tablet Take 100 mg by mouth 2 (two) times daily.     gabapentin (NEURONTIN) 600 MG tablet Take 1 tablet (600 mg total) by mouth 3 (three) times daily. 270 tablet 3   HYDROmorphone (DILAUDID) 8 MG tablet 8 mg in the morning, at noon, in the evening, and at bedtime.     naloxegol oxalate (MOVANTIK) 25 MG TABS tablet Take 25 mg by mouth daily as needed.      prednisoLONE acetate (PRED FORTE) 1 % ophthalmic suspension Place 1 drop into both eyes in the morning and at bedtime.   0   timolol (TIMOPTIC) 0.5 % ophthalmic solution 1 drop daily.     zolpidem (AMBIEN) 10 MG tablet Take 10 mg by mouth at bedtime as needed for sleep.     No current facility-administered medications for this visit.    PAST MEDICAL HISTORY: Past Medical History:  Diagnosis Date   Bilateral foot-drop 10/19/2018   Bursitis, trochanteric    Episodic   Carpal tunnel syndrome on right    Degenerative  disk disease    l5-S1   FSH (facioscapulohumeral muscular dystrophy) ([place]) 10/07/2017   Gait abnormality 10/19/2018   GERD (gastroesophageal reflux disease)    Hemorrhoids    with anal fissures   Left lateral epicondylitis    MS (multiple sclerosis) (H[facility])    Parkinson's disease ([place]) 10/19/2018    PAST SURGICAL HISTORY: Past Surgical History:  Procedure Laterality Date   CHOLECYSTECTOMY  2007   HEMORRHOIDECTOMY WITH HEMORRHOID BANDING     IR KYPHO LUMBAR INC FX REDUCE BONE BX UNI/BIL CANNULATION INC/IMAGING  01/10/2020    FAMILY HISTORY: Family History  Problem Relation Age of Onset   Allergies Brother    Allergies Sister    Heart disease Father    Brain cancer Mother    Bone cancer Brother     SOCIAL HISTORY: Social History   Socioeconomic History   Marital status: Married    Spouse name: [name]   Number of children: 0   Years of education: Not on file   Highest education level: Not on file  Occupational History   Occupation:  Programmer, [employer]  Tobacco Use   Smoking status: Never   Smokeless tobacco: Never  Substance and Sexual Activity   Alcohol use: No   Drug use: No   Sexual activity: Not on file  Other Topics Concern   Not on file  Social History Narrative   Lives with wife   Caffeine use: Sometimes tea   Right handed    Social Determinants of Health   Financial Resource Strain: Not on file  Food Insecurity: Not on file  Transportation Needs: Not on file  Physical Activity: Not on file  Stress: Not on file  Social Connections: Not on file  Intimate Partner Violence: Not on file   Total time spent reviewing the chart, obtaining history, examined patient, ordering tests, documentation, consultations and family, care coordination was 62 minutes     Marcial Pacas, M.D. Ph.D.  [facility] [address] [place], [place] [address] Ph: [phone] Fax: [phone]  CC:  [name], MD [address],  [place] [address]  [name], MD

## 2021-10-22 ENCOUNTER — Other Ambulatory Visit: Payer: Self-pay | Admitting: *Deleted

## 2021-10-22 ENCOUNTER — Other Ambulatory Visit: Payer: Self-pay | Admitting: Psychiatry

## 2021-10-22 ENCOUNTER — Telehealth: Payer: Self-pay | Admitting: Neurology

## 2021-10-22 MED ORDER — LACTULOSE 10 GM/15ML PO SOLN: 10.0000 g | Freq: Every day | ORAL | 11 refills | Status: DC | PRN

## 2021-10-22 NOTE — Telephone Encounter (Signed)
Rx updated, thanks.

## 2021-10-22 NOTE — Telephone Encounter (Signed)
Pt's wife called stating that the medication that was called in for him yesterday was sent to the wrong pharmacy and it is needing to be sent to the [facility] on [address].

## 2021-10-22 NOTE — Telephone Encounter (Signed)
[place] Marye Round) calling to verify the frequency for lactulose (CHRONULAC) 10 GM/15ML solution. Would like a call back

## 2021-10-22 NOTE — Telephone Encounter (Signed)
In review of chart, all three have now been sent to [facility]. Left voicemail, with patient's wife, providing this update.

## 2021-10-22 NOTE — Telephone Encounter (Signed)
[facility] aware of the update.

## 2021-11-14 ENCOUNTER — Telehealth: Payer: Self-pay

## 2021-11-14 NOTE — Telephone Encounter (Signed)
Luis [name] called in to asks about billing.  Updated Luis [name] and reached out to billing company for confirmation of resubmitted claims. Made follow up appt for Luis [name] for 4/25 at 9 am per request of Ms. Handley.  She will let us know if appt needs to change ?

## 2021-12-25 ENCOUNTER — Other Ambulatory Visit: Payer: Medicare Other | Admitting: Nurse Practitioner

## 2021-12-25 ENCOUNTER — Encounter: Payer: Self-pay | Admitting: Nurse Practitioner

## 2021-12-25 DIAGNOSIS — G20 Parkinson's disease: Secondary | ICD-10-CM

## 2021-12-25 DIAGNOSIS — Z515 Encounter for palliative care: Secondary | ICD-10-CM

## 2021-12-25 DIAGNOSIS — R5381 Other malaise: Secondary | ICD-10-CM

## 2021-12-25 NOTE — Progress Notes (Signed)
? ? ?Manufacturing engineer ?[facility] Consult Note ?Telephone: [phone]  ?Fax: [phone]  ? ? ?Date of encounter: 12/25/21 ?1:42 PM ?PATIENT NAME: Luis E Aguinaga Jr. ?[address] [address] ?[place] [address]   ?[phone] (home) [phone] (work) ?DOB: October 14, 1955 ?MRN: [id number] ?PRIMARY CARE PROVIDER:    ?[name], MD,  ?[address] ?[place] [address] ?[phone] ? ?RESPONSIBLE PARTY:    ?Contact Information   ? ? Name Relation Home Work Mobile  ? Holycross,Sandra Spouse [phone]  [phone]  ? ?  ? ?I met face to face with patient and family in home. Palliative Care was asked to follow this patient by consultation request of  [name], MD to address advance care planning and complex medical decision making. This is a follow up visit.                                  ?ASSESSMENT AND PLAN / RECOMMENDATIONS:  ?Symptom Management/Plan: ?1. Advance Care Planning; DNR; will place in vynca; ?  ?2. Debility secondary with gait disturbance secondary to  [facility] muscular dystrophy/Parkinson disease, talked about fall risk, mobility; progressive decline, revisited Hospice eligibility as Luis [name] continues to decompensate overall functionally. We talked at length about recent visit to Neurology as Dr Jannifer Franklin has retired, meeting his new Neurologist Dr Krista Blue and visit with increase in sinemet. We talked about functional limitations. We talked about recent fall left bruising to right upper arm. We talked about fall risk, walker and length. We talked about new sneakers Luis [name] has which seems to be more comfortable and helping with ambulation. We talked about a hospital bed. Offered to send in order for longer walker and hospital bed, [name] preferred to wait. We talked about ros, appetite. We talked about bowel regimen, sleep. We talked about disease progression. We talked about f/u PC visit, scheduled. Most PC visit supportive. Questions answered.  ?  ?3. Chronic pain  secondary to [facility] muscular dystrophy/Parkinson disease managed by Dr Inda Merlin with Dilaudid with effective relief.  ?  ?4. Palliative care encounter; Palliative care encounter; Palliative medicine team will continue to support patient, patient's family, and medical team. Visit consisted of counseling and education dealing with the complex and emotionally intense issues of symptom management and palliative care in the setting of serious and potentially life-threatening  ? ?Follow up Palliative Care Visit: Palliative care will continue to follow for complex medical decision making, advance care planning, and clarification of goals. Return 16 weeks or prn. ? ?I spent 31 minutes providing this consultation. More than 50% of the time in this consultation was spent in counseling and care coordination. ?PPS: 40% ?Chief Complaint: Follow up palliative consult for complex medical decision making ? ?HISTORY OF PRESENT ILLNESS:  Gurshaan Matsuoka Lessig [name]. is a 66 y.o. year old male  with multiple medical problems including FSH muscular dystrophy (,He was clinically diagnosed with FSHD in 89 (66 yo) at [facility]. Workup included NCV/EMG and muscle biopsy. 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 Small left-sided disc protrusion L1-2. There is also a small extraforaminal discprotrusion on left at L5-S1.I visited Luis and Mrs [name] in their home. Luis [name]  was sitting in his recliner. We talked about updated with Dr Inda Merlin, Neurology appointment, symptoms including pain, shortness of breath with exertion/fatigue with weakness. We talked about trilogy, Luis [name] wearing only at night. We talked about bowels, appetite, sleep. We talked about  functional debility, dme equipment which Mrs. Drees wished to hold off for now.   Medical goals reviewed. We talked about f/u pc visit, scheduled with Luis/Mrs [name] agreement. Most PC visit counseling, supportive. Therapeutic listening. ? ?History obtained from review of EMR, discussion with primary team, and interview with family, facility staff/caregiver and/or Luis [name].  ?I reviewed available labs, medications, imaging, studies and related documents from the EMR.  Records reviewed and summarized above.  ? ?ROS ?10 point system reviewed all negative except HPI ? ?Physical Exam: ?Constitutional: NAD ?General: pleasant debilitated male ?EYES: lids intact ?ENMT:  oral mucous membranes moist ?CV: S1S2, RRR, no LE edema ?Pulmonary: decrease bases, no increased work of breathing, no cough, room air ?Abdomen:  normo-active BS + 4 quadrants, soft and non tender ?MSK: unsteady gait uses walker ?Skin: warm and dry ?Neuro:  + generalized weakness ?Psych: non-anxious affect, A and O x 3 ?Thank you for the opportunity to participate in the care of Luis [name].  The palliative care team will continue to follow. Please call our office at [phone] if we can be of additional assistance.  ? ?Belina Mandile Z Shelia Kingsberry, NP  ? ?  ?

## 2022-04-05 ENCOUNTER — Other Ambulatory Visit: Payer: Medicare Other | Admitting: Nurse Practitioner

## 2022-04-12 ENCOUNTER — Other Ambulatory Visit: Payer: Medicare Other | Admitting: Nurse Practitioner

## 2022-04-12 ENCOUNTER — Encounter: Payer: Self-pay | Admitting: Nurse Practitioner

## 2022-04-12 DIAGNOSIS — G20 Parkinson's disease: Secondary | ICD-10-CM

## 2022-04-12 DIAGNOSIS — R269 Unspecified abnormalities of gait and mobility: Secondary | ICD-10-CM

## 2022-04-12 DIAGNOSIS — R5381 Other malaise: Secondary | ICD-10-CM

## 2022-04-12 DIAGNOSIS — Z515 Encounter for palliative care: Secondary | ICD-10-CM

## 2022-04-12 NOTE — Progress Notes (Signed)
[facility] Consult Note Telephone: [phone]  Fax: [phone]    Date of encounter: 04/12/22 4:11 PM PATIENT NAME: Luis [name] [id number]   [phone] (home) [phone] (work) DOB: 1956-07-04 MRN: [id number] PRIMARY CARE PROVIDER:    [name], MD,  [place] [address] [place] [address] [phone]  RESPONSIBLE PARTY:    Contact Information     Name Relation Home Work Mobile   Maden,Sandra Spouse [phone]  [phone]       I met face to face with patient and family in home. Palliative Care was asked to follow this patient by consultation request of  [name], MD to address advance care planning and complex medical decision making. This is a follow up visit.                                  ASSESSMENT AND PLAN / RECOMMENDATIONS:  Symptom Management/Plan: 1. Advance Care Planning; DNR; will place in vynca;   2. Debility secondary with gait disturbance secondary to  [facility] muscular dystrophy/Parkinson disease, talked about fall risk, mobility; progressive decline, functional abilities.   3. Chronic pain secondary to [facility] muscular dystrophy/Parkinson disease managed by Dr Inda Merlin (Dr Inda Merlin will be retiring soon with transition to another primary provider in same practice) with Dilaudid with effective relief.    4. Palliative care encounter; Palliative care encounter; Palliative medicine team will continue to support patient, patient's family, and medical team. Visit consisted of counseling and education dealing with the complex and emotionally intense issues of symptom management and palliative care in the setting of serious and potentially life-threatening    Follow up Palliative Care Visit: Palliative care will continue to follow for complex medical decision making, advance care planning, and clarification of goals. Return 16 weeks or prn.   I spent 30 minutes providing this  consultation. More than 50% of the time in this consultation was spent in counseling and care coordination. PPS: 40% Chief Complaint: Follow up palliative consult for complex medical decision making   HISTORY OF PRESENT ILLNESS:  Luis Mellinger Tay [name]. is a 66 y.o. year old male  with multiple medical problems including FSH muscular dystrophy (,He was clinically diagnosed with FSHD in 15 (66 yo) at [facility]. Workup included NCV/EMG and muscle biopsy. 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 Small left-sided disc protrusion L1-2. There is also a small extraforaminal disc protrusion on left at L5-S1. I visited Mr and Mrs Luis [name] in their home. Mr Luis [name] was sitting in his recliner. We talked about how Luis [name] has been feeling, ros, functional abilities. We talked about ambulation with walker. We talked about appetite, constipation, sleep, trilogy.  We talked about updated with Dr Inda Merlin, Neurology appointment, symptoms including pain, shortness of breath with exertion/fatigue with weakness. We talked about medical goals. Discussed caregiver stress, coping strategies. Talked about f/u pc visit, scheduled. Therapeutic listening, emotional support provided. Questions answered.   Medical goals reviewed. We talked about f/u pc visit, scheduled with Mr/Mrs Luis [name] agreement. Most PC visit counseling, supportive. Therapeutic listening.   History obtained from review of EMR, discussion with Mrs and  Luis [name].  I reviewed available labs, medications, imaging, studies and related documents from the EMR.  Records reviewed and summarized above.    ROS 10 point system reviewed all negative except HPI   Physical  Exam: Constitutional: NAD General: pleasant debilitated male EYES: lids intact ENMT:  oral mucous membranes moist CV: S1S2, RRR, no LE edema Pulmonary: decrease bases, no increased work of breathing, no cough, room air MSK: unsteady gait uses walker Skin: warm and dry Neuro:  + generalized weakness Psych: non-anxious affect, A and O x 3  Thank you for the opportunity to participate in the care of Luis [name].  The palliative care team will continue to follow. Please call our office at [phone] if we can be of additional assistance.   Jaleiah Asay Ihor Gully, NP

## 2022-04-18 ENCOUNTER — Ambulatory Visit (INDEPENDENT_AMBULATORY_CARE_PROVIDER_SITE_OTHER): Payer: Medicare Other | Admitting: Neurology

## 2022-04-18 ENCOUNTER — Ambulatory Visit: Payer: Medicare Other | Admitting: Neurology

## 2022-04-18 ENCOUNTER — Encounter: Payer: Self-pay | Admitting: Neurology

## 2022-04-18 VITALS — BP 100/63 | HR 50

## 2022-04-18 DIAGNOSIS — G20 Parkinson's disease: Secondary | ICD-10-CM

## 2022-04-18 DIAGNOSIS — R269 Unspecified abnormalities of gait and mobility: Secondary | ICD-10-CM | POA: Diagnosis not present

## 2022-04-18 DIAGNOSIS — G7102 Facioscapulohumeral muscular dystrophy: Secondary | ICD-10-CM | POA: Diagnosis not present

## 2022-04-18 NOTE — Progress Notes (Signed)
Chief Complaint  Patient presents with   Follow-up    Room 17 w/ wife Katharine Look. Six month follow up. Parkinson's Disease, FSH, neuropathic pain. Reports being having more fatigued and stiffness since his last visit. Increased shortness of breath.       ASSESSMENT AND PLAN  Michelle Vanhise Chartrand [name]. is a 66 y.o. male   Parkinson's disease  Has been followed by Dr. Jannifer Franklin in the past, was taking immediate release Sinemet 25/100 mg tablets, but in September 2020 he developed dizziness, lightheadedness, after immediate release of Sinemet, has been changed to CR, on slow titrating dose, was changed to Sinemet 50/200 CR 1 tablet 3 times a day since September 2022, seems to tolerated better. He is doing better with change of his Sinemet CR schedule 50/200 at 8 AM, 2 PM, 9 PM  Chronic severe neuropathic pain  I will increase his gabapentin from 600 mg 3 times daily to 1 extra dose as needed  He is also getting Dilaudid 8 mg 4 times a day from his primary care physician Dr. Inda Merlin   Genetically confirmed FSHD, followed up by Dr. [name] at [facility] clinic Dilated cardiomyopathy with reduced EF (41%),  mild left carpal tunnel syndrome diaphragmatic paralysis on the left,  He was clinically diagnosed with FSHD in 72 (66 yo) at [facility]. Workup included NCV/EMG and muscle biopsy. 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 Is on Cymbalta 30 mg twice a day, has helped his muscle achy pain  DIAGNOSTIC DATA (LABS, IMAGING, TESTING) - I reviewed patient records, labs, notes, testing and imaging myself where available.   MEDICAL HISTORY:  Eann Cleland Lopp [name]., is a 66 year old male, accompanied by his wife, to follow-up for Parkinson's disease, FSH muscular dystrophy, his primary care physician is Dr. 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 He was clinically diagnosed with FSHD in 58 (66 yo) at [facility]. Workup included NCV/EMG and muscle biopsy. 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  He is followed up by Dr. [name] at [facility] clinic every 6 months  He also had a gradual onset right hand tremor, was diagnosed with Parkinson's disease by Dr. Jannifer Franklin, he was on regular form of Sinemet 25/100 mg in the past, but developed significant dizziness after each dose, has been switched to current Sinemet CR 50/200 mg 3 times daily, taking it at 8 AM, 4 PM, and bedtime  In addition, he suffered a significant joints, diffuse muscle achy pain, is getting Dilaudid 8 mg 4 times a day from his primary care physician Dr. [name]  Patient and his wife strive to keep him active, their daily routine involving frequent walking short distances walker, standing against the wall,  He has began palliative care who comes to their home. Per the patient this is to decrease the number of visits he has to travel to.  He has continued to have significant pain and weakness of his low back and LLE since he underwent a nerve ablation at L3-4 performed October 2021.   For mobility, he is continuing to use a walker all the time. He also has a PWC at home,  which they report they are working with [employer] to continue to adjust the seat for comfort.   He is using Trilogy machine is working well. He continues to have some shortness of breath, especially with activity. He wears his Trilogy every night. Will wear it rarely during the day when taking a nap or when symptomatic.   UPDATE August 17th 2023: He is accompanied by his wife at today's visit, continues see [facility] by Dr.  Tillman Abide every 6 months, Dr. Inda Merlin recently started him on Cymbalta 30 mg bid , it seems to help his pain better  Complains of increased fatigue stiffness since last visit, but there was over all no significant change, he continue to try his best exercise daily, in standing position,  PHYSICAL EXAM:   Vitals:   04/18/22 1025  BP: 100/63  Pulse: (!) 50    PHYSICAL EXAMNIATION:  Gen: NAD, conversant, well nourised, well groomed                     Cardiovascular: Regular rate rhythm, no peripheral edema, warm, nontender. Eyes: Conjunctivae clear without exudates or hemorrhage Neck: Supple, no carotid bruits. Pulmonary: Decreased air movement at the left lung base  NEUROLOGICAL EXAM:  MENTAL STATUS: Speech/cognition: Awake, alert, oriented to history taking and casual conversation, mild slurred speech   CRANIAL NERVES: CN II: Visual fields are full to confrontation. Pupils are round equal and briskly reactive to light. CN III, IV, VI: extraocular movement are normal. No ptosis. CN V: Facial sensation is intact to light touch CN VII:  severe bilateral eye closure, cheek puff weakness, flaccid facial muscle CN VIII: Hearing is normal to causal conversation. CN IX, X: Phonation is slurred CN XI: Head turning and shoulder shrug are intact  MOTOR: Significant atrophy of bilateral proximal arm muscles, especially bilateral deltoid, biceps, barely antigravity movement of bilateral proximal upper extremity muscles, left slightly weaker than the right, fairly normal bilateral handgrip, wrist extension, wrist flexion  Antigravity movement of bilateral lower extremity muscles, bilateral hip flexion, knee extension, knee flexion, trace movement of bilateral ankle dorsiflexion,  REFLEXES: Reflexes are areflexia  SENSORY: Intact to light touch,  COORDINATION: There is no trunk or limb dysmetria noted.  GAIT/STANCE: Deferred  REVIEW OF SYSTEMS:  Full 14 system review of systems  performed and notable only for as above All other review of systems were negative.   ALLERGIES: No Known Allergies  HOME MEDICATIONS: Current Outpatient Medications  Medication Sig Dispense Refill   carbidopa-levodopa (SINEMET CR) 50-200 MG tablet Take 1 tablet by mouth in the morning, at noon, and at bedtime. 270 tablet 4   Carboxymethylcellulose Sodium (REFRESH TEARS OP) Place 1 drop into both eyes 6 (six) times daily.     Docusate Calcium (STOOL SOFTENER PO) Take 2 tablets by mouth at bedtime.     doxycycline (VIBRA-TABS) 100 MG tablet Take 100 mg by mouth 2 (two) times daily.     DULoxetine (CYMBALTA) 30 MG capsule Take 30 mg by mouth 2 (two) times daily.     gabapentin (NEURONTIN) 600 MG tablet Take 1 tablet (600 mg total) by mouth 4 (four) times daily. 360 tablet 4   HYDROmorphone (DILAUDID) 8 MG tablet 8 mg in the morning, at noon, in the evening, and at bedtime.     lactulose (CHRONULAC) 10 GM/15ML solution Take 15 mLs (10 g total) by mouth daily as needed for mild constipation. 500 mL 11   naloxegol oxalate (MOVANTIK) 25 MG TABS  tablet Take 25 mg by mouth daily as needed.      polyethylene glycol (MIRALAX / GLYCOLAX) 17 g packet Take 17 g by mouth at bedtime.     prednisoLONE acetate (PRED FORTE) 1 % ophthalmic suspension Place 1 drop into both eyes in the morning and at bedtime.   0   Psyllium (METAMUCIL PO) Take 1 tablet by mouth daily.     timolol (TIMOPTIC) 0.5 % ophthalmic solution 1 drop daily.     zolpidem (AMBIEN) 10 MG tablet Take 10 mg by mouth at bedtime as needed for sleep.     No current facility-administered medications for this visit.    PAST MEDICAL HISTORY: Past Medical History:  Diagnosis Date   Bilateral foot-drop 10/19/2018   Bursitis, trochanteric    Episodic   Carpal tunnel syndrome on right    Degenerative disk disease    l5-S1   FSH (facioscapulohumeral muscular dystrophy) ([place]) 10/07/2017   Gait abnormality 10/19/2018   GERD (gastroesophageal  reflux disease)    Hemorrhoids    with anal fissures   Left lateral epicondylitis    MS (multiple sclerosis) (H[facility])    Parkinson's disease ([place]) 10/19/2018   Skin cancer    right temple area    PAST SURGICAL HISTORY: Past Surgical History:  Procedure Laterality Date   CHOLECYSTECTOMY  2007   HEMORRHOIDECTOMY WITH HEMORRHOID BANDING     IR KYPHO LUMBAR INC FX REDUCE BONE BX UNI/BIL CANNULATION INC/IMAGING  01/10/2020    FAMILY HISTORY: Family History  Problem Relation Age of Onset   Allergies Brother    Allergies Sister    Heart disease Father    Brain cancer Mother    Bone cancer Brother     SOCIAL HISTORY: Social History   Socioeconomic History   Marital status: Married    Spouse name: [name]   Number of children: 0   Years of education: Not on file   Highest education level: Not on file  Occupational History   Occupation: Programmer, [employer]  Tobacco Use   Smoking status: Never   Smokeless tobacco: Never  Substance and Sexual Activity   Alcohol use: No   Drug use: No   Sexual activity: Not on file  Other Topics Concern   Not on file  Social History Narrative   Lives with wife   Caffeine use: Sometimes tea   Right handed    Social Determinants of Health   Financial Resource Strain: Not on file  Food Insecurity: Not on file  Transportation Needs: Not on file  Physical Activity: Not on file  Stress: Not on file  Social Connections: Not on file  Intimate Partner Violence: Not on file   Total time spent reviewing the chart, obtaining history, examined patient, ordering tests, documentation, consultations and family, care coordination was 71 minutes     Marcial Pacas, M.D. Ph.D.  [facility] [address] [place], [place] [address] Ph: [phone] Fax: [phone]  CC:  [name], MD [address],  [place] [address]  [name], MD

## 2022-05-27 ENCOUNTER — Encounter (HOSPITAL_COMMUNITY): Payer: Self-pay

## 2022-05-27 ENCOUNTER — Other Ambulatory Visit: Payer: Self-pay

## 2022-05-27 ENCOUNTER — Emergency Department (HOSPITAL_COMMUNITY): Payer: Medicare Other

## 2022-05-27 ENCOUNTER — Inpatient Hospital Stay (HOSPITAL_COMMUNITY)
Admission: EM | Admit: 2022-05-27 | Discharge: 2022-06-04 | DRG: 517 | Disposition: A | Discharge status: Rehabilitation Facility | Payer: Medicare Other | Attending: Internal Medicine | Admitting: Internal Medicine

## 2022-05-27 DIAGNOSIS — Z9181 History of falling: Secondary | ICD-10-CM | POA: Diagnosis not present

## 2022-05-27 DIAGNOSIS — N401 Enlarged prostate with lower urinary tract symptoms: Secondary | ICD-10-CM | POA: Diagnosis present

## 2022-05-27 DIAGNOSIS — F419 Anxiety disorder, unspecified: Secondary | ICD-10-CM | POA: Diagnosis present

## 2022-05-27 DIAGNOSIS — Z66 Do not resuscitate: Secondary | ICD-10-CM | POA: Diagnosis present

## 2022-05-27 DIAGNOSIS — G7102 Facioscapulohumeral muscular dystrophy: Secondary | ICD-10-CM | POA: Diagnosis present

## 2022-05-27 DIAGNOSIS — S32039A Unspecified fracture of third lumbar vertebra, initial encounter for closed fracture: Principal | ICD-10-CM | POA: Diagnosis present

## 2022-05-27 DIAGNOSIS — Z1152 Encounter for screening for COVID-19: Secondary | ICD-10-CM | POA: Diagnosis not present

## 2022-05-27 DIAGNOSIS — S32000S Wedge compression fracture of unspecified lumbar vertebra, sequela: Secondary | ICD-10-CM | POA: Diagnosis not present

## 2022-05-27 DIAGNOSIS — M6250 Muscle wasting and atrophy, not elsewhere classified, unspecified site: Secondary | ICD-10-CM | POA: Diagnosis present

## 2022-05-27 DIAGNOSIS — Z8249 Family history of ischemic heart disease and other diseases of the circulatory system: Secondary | ICD-10-CM | POA: Diagnosis not present

## 2022-05-27 DIAGNOSIS — G20 Parkinson's disease: Secondary | ICD-10-CM | POA: Diagnosis not present

## 2022-05-27 DIAGNOSIS — W19XXXA Unspecified fall, initial encounter: Secondary | ICD-10-CM | POA: Diagnosis present

## 2022-05-27 DIAGNOSIS — Z85828 Personal history of other malignant neoplasm of skin: Secondary | ICD-10-CM

## 2022-05-27 DIAGNOSIS — M21372 Foot drop, left foot: Secondary | ICD-10-CM | POA: Diagnosis present

## 2022-05-27 DIAGNOSIS — M25521 Pain in right elbow: Secondary | ICD-10-CM | POA: Diagnosis not present

## 2022-05-27 DIAGNOSIS — Z79899 Other long term (current) drug therapy: Secondary | ICD-10-CM

## 2022-05-27 DIAGNOSIS — Z9049 Acquired absence of other specified parts of digestive tract: Secondary | ICD-10-CM | POA: Diagnosis not present

## 2022-05-27 DIAGNOSIS — S32000A Wedge compression fracture of unspecified lumbar vertebra, initial encounter for closed fracture: Secondary | ICD-10-CM | POA: Diagnosis present

## 2022-05-27 DIAGNOSIS — Z8781 Personal history of (healed) traumatic fracture: Secondary | ICD-10-CM | POA: Diagnosis not present

## 2022-05-27 DIAGNOSIS — S32030D Wedge compression fracture of third lumbar vertebra, subsequent encounter for fracture with routine healing: Secondary | ICD-10-CM | POA: Diagnosis not present

## 2022-05-27 DIAGNOSIS — Z741 Need for assistance with personal care: Secondary | ICD-10-CM | POA: Diagnosis present

## 2022-05-27 DIAGNOSIS — Z808 Family history of malignant neoplasm of other organs or systems: Secondary | ICD-10-CM | POA: Diagnosis not present

## 2022-05-27 DIAGNOSIS — G629 Polyneuropathy, unspecified: Secondary | ICD-10-CM | POA: Diagnosis present

## 2022-05-27 DIAGNOSIS — R3915 Urgency of urination: Secondary | ICD-10-CM | POA: Diagnosis present

## 2022-05-27 DIAGNOSIS — Z79891 Long term (current) use of opiate analgesic: Secondary | ICD-10-CM | POA: Diagnosis not present

## 2022-05-27 DIAGNOSIS — K219 Gastro-esophageal reflux disease without esophagitis: Secondary | ICD-10-CM | POA: Diagnosis present

## 2022-05-27 DIAGNOSIS — R0789 Other chest pain: Secondary | ICD-10-CM | POA: Diagnosis present

## 2022-05-27 DIAGNOSIS — G20A1 Parkinson's disease without dyskinesia, without mention of fluctuations: Secondary | ICD-10-CM | POA: Diagnosis present

## 2022-05-27 DIAGNOSIS — S32030A Wedge compression fracture of third lumbar vertebra, initial encounter for closed fracture: Secondary | ICD-10-CM | POA: Diagnosis not present

## 2022-05-27 DIAGNOSIS — M25522 Pain in left elbow: Secondary | ICD-10-CM | POA: Diagnosis not present

## 2022-05-27 DIAGNOSIS — M21371 Foot drop, right foot: Secondary | ICD-10-CM | POA: Diagnosis present

## 2022-05-27 DIAGNOSIS — G35 Multiple sclerosis: Secondary | ICD-10-CM | POA: Diagnosis present

## 2022-05-27 DIAGNOSIS — S32039D Unspecified fracture of third lumbar vertebra, subsequent encounter for fracture with routine healing: Secondary | ICD-10-CM | POA: Diagnosis present

## 2022-05-27 DIAGNOSIS — M549 Dorsalgia, unspecified: Secondary | ICD-10-CM | POA: Diagnosis present

## 2022-05-27 DIAGNOSIS — I420 Dilated cardiomyopathy: Secondary | ICD-10-CM | POA: Diagnosis present

## 2022-05-27 DIAGNOSIS — D696 Thrombocytopenia, unspecified: Secondary | ICD-10-CM | POA: Diagnosis present

## 2022-05-27 DIAGNOSIS — K5900 Constipation, unspecified: Secondary | ICD-10-CM | POA: Diagnosis present

## 2022-05-27 DIAGNOSIS — S62662A Nondisplaced fracture of distal phalanx of right middle finger, initial encounter for closed fracture: Secondary | ICD-10-CM | POA: Diagnosis present

## 2022-05-27 DIAGNOSIS — M792 Neuralgia and neuritis, unspecified: Secondary | ICD-10-CM | POA: Diagnosis not present

## 2022-05-27 DIAGNOSIS — G8929 Other chronic pain: Secondary | ICD-10-CM | POA: Diagnosis present

## 2022-05-27 DIAGNOSIS — G7100 Muscular dystrophy, unspecified: Secondary | ICD-10-CM

## 2022-05-27 DIAGNOSIS — M7022 Olecranon bursitis, left elbow: Secondary | ICD-10-CM | POA: Diagnosis not present

## 2022-05-27 DIAGNOSIS — Z9889 Other specified postprocedural states: Secondary | ICD-10-CM | POA: Diagnosis not present

## 2022-05-27 DIAGNOSIS — R770 Abnormality of albumin: Secondary | ICD-10-CM | POA: Diagnosis not present

## 2022-05-27 DIAGNOSIS — W1830XD Fall on same level, unspecified, subsequent encounter: Secondary | ICD-10-CM | POA: Diagnosis not present

## 2022-05-27 LAB — COMPREHENSIVE METABOLIC PANEL
ALT: 10 U/L (ref 0–44)
AST: 19 U/L (ref 15–41)
Albumin: 3.6 g/dL (ref 3.5–5.0)
Alkaline Phosphatase: 91 U/L (ref 38–126)
Anion gap: 9 (ref 5–15)
BUN: 8 mg/dL (ref 8–23)
CO2: 24 mmol/L (ref 22–32)
Calcium: 9 mg/dL (ref 8.9–10.3)
Chloride: 104 mmol/L (ref 98–111)
Creatinine, Ser: <0.3 mg/dL — ABNORMAL LOW (ref 0.61–1.24)
Glucose, Bld: 87 mg/dL (ref 70–99)
Potassium: 4 mmol/L (ref 3.5–5.1)
Sodium: 137 mmol/L (ref 135–145)
Total Bilirubin: 0.9 mg/dL (ref 0.3–1.2)
Total Protein: 6.6 g/dL (ref 6.5–8.1)

## 2022-05-27 LAB — CBC
HCT: 48.4 % (ref 39.0–52.0)
Hemoglobin: 15.4 g/dL (ref 13.0–17.0)
MCH: 32 pg (ref 26.0–34.0)
MCHC: 31.8 g/dL (ref 30.0–36.0)
MCV: 100.6 fL — ABNORMAL HIGH (ref 80.0–100.0)
Platelets: 150 10*3/uL (ref 150–400)
RBC: 4.81 MIL/uL (ref 4.22–5.81)
RDW: 12.4 % (ref 11.5–15.5)
WBC: 5.7 10*3/uL (ref 4.0–10.5)
nRBC: 0 % (ref 0.0–0.2)

## 2022-05-27 LAB — URINALYSIS, ROUTINE W REFLEX MICROSCOPIC
Bacteria, UA: NONE SEEN
Bilirubin Urine: NEGATIVE
Glucose, UA: NEGATIVE mg/dL
Ketones, ur: 5 mg/dL — AB
Leukocytes,Ua: NEGATIVE
Nitrite: NEGATIVE
Protein, ur: NEGATIVE mg/dL
Specific Gravity, Urine: 1.023 (ref 1.005–1.030)
pH: 5 (ref 5.0–8.0)

## 2022-05-27 LAB — CBC WITH DIFFERENTIAL/PLATELET
Abs Immature Granulocytes: 0.02 10*3/uL (ref 0.00–0.07)
Basophils Absolute: 0 10*3/uL (ref 0.0–0.1)
Basophils Relative: 0 %
Eosinophils Absolute: 0.1 10*3/uL (ref 0.0–0.5)
Eosinophils Relative: 1 %
HCT: 45.2 % (ref 39.0–52.0)
Hemoglobin: 14.9 g/dL (ref 13.0–17.0)
Immature Granulocytes: 0 %
Lymphocytes Relative: 10 %
Lymphs Abs: 0.6 10*3/uL — ABNORMAL LOW (ref 0.7–4.0)
MCH: 32.5 pg (ref 26.0–34.0)
MCHC: 33 g/dL (ref 30.0–36.0)
MCV: 98.7 fL (ref 80.0–100.0)
Monocytes Absolute: 0.6 10*3/uL (ref 0.1–1.0)
Monocytes Relative: 11 %
Neutro Abs: 4.6 10*3/uL (ref 1.7–7.7)
Neutrophils Relative %: 78 %
Platelets: 150 10*3/uL (ref 150–400)
RBC: 4.58 MIL/uL (ref 4.22–5.81)
RDW: 12.4 % (ref 11.5–15.5)
WBC: 5.9 10*3/uL (ref 4.0–10.5)
nRBC: 0 % (ref 0.0–0.2)

## 2022-05-27 LAB — RESP PANEL BY RT-PCR (FLU A&B, COVID) ARPGX2
Influenza A by PCR: NEGATIVE
Influenza B by PCR: NEGATIVE
SARS Coronavirus 2 by RT PCR: NEGATIVE

## 2022-05-27 LAB — CREATININE, SERUM: Creatinine, Ser: <0.3 mg/dL — ABNORMAL LOW (ref 0.61–1.24)

## 2022-05-27 MED ORDER — POLYETHYLENE GLYCOL 3350 17 G PO PACK: 17.0000 g | PACK | Freq: Every day | ORAL | Status: DC

## 2022-05-27 MED ORDER — ONDANSETRON HCL 4 MG PO TABS: 4.0000 mg | ORAL_TABLET | Freq: Four times a day (QID) | ORAL | Status: DC | PRN

## 2022-05-27 MED ORDER — DIAZEPAM 5 MG PO TABS
10.0000 mg | ORAL_TABLET | Freq: Two times a day (BID) | ORAL | Status: DC | PRN
  Administered 2022-05-27 – 2022-06-02 (×5): 10 mg via ORAL
  Filled 2022-05-27 (×5): qty 2

## 2022-05-27 MED ORDER — CARBIDOPA-LEVODOPA ER 50-200 MG PO TBCR
1.0000 | EXTENDED_RELEASE_TABLET | Freq: Three times a day (TID) | ORAL | Status: DC
  Administered 2022-05-27 – 2022-06-04 (×24): 1 via ORAL
  Filled 2022-05-27 (×25): qty 1

## 2022-05-27 MED ORDER — POLYETHYLENE GLYCOL 3350 17 G PO PACK
17.0000 g | PACK | Freq: Every day | ORAL | Status: DC
  Administered 2022-05-27 – 2022-05-29 (×3): 17 g via ORAL
  Filled 2022-05-27 (×3): qty 1

## 2022-05-27 MED ORDER — HYDROMORPHONE HCL 1 MG/ML IJ SOLN
1.0000 mg | Freq: Once | INTRAMUSCULAR | Status: AC
  Administered 2022-05-27: 1 mg via INTRAVENOUS
  Filled 2022-05-27: qty 1

## 2022-05-27 MED ORDER — HEPARIN SODIUM (PORCINE) 5000 UNIT/ML IJ SOLN
5000.0000 [IU] | Freq: Three times a day (TID) | INTRAMUSCULAR | Status: DC
  Administered 2022-05-27 – 2022-05-29 (×5): 5000 [IU] via SUBCUTANEOUS
  Filled 2022-05-27 (×5): qty 1

## 2022-05-27 MED ORDER — ONDANSETRON HCL 4 MG/2ML IJ SOLN: 4.0000 mg | Freq: Four times a day (QID) | INTRAMUSCULAR | Status: DC | PRN

## 2022-05-27 MED ORDER — ONDANSETRON HCL 4 MG/2ML IJ SOLN
4.0000 mg | Freq: Once | INTRAMUSCULAR | Status: AC
  Administered 2022-05-27: 4 mg via INTRAVENOUS
  Filled 2022-05-27: qty 2

## 2022-05-27 MED ORDER — DOCUSATE SODIUM 100 MG PO CAPS
100.0000 mg | ORAL_CAPSULE | Freq: Every day | ORAL | Status: DC
  Administered 2022-05-27 – 2022-06-03 (×8): 100 mg via ORAL
  Filled 2022-05-27 (×8): qty 1

## 2022-05-27 MED ORDER — HYDROMORPHONE HCL 1 MG/ML IJ SOLN
1.0000 mg | INTRAMUSCULAR | Status: DC | PRN
  Administered 2022-05-27 – 2022-05-30 (×9): 1 mg via INTRAVENOUS
  Filled 2022-05-27 (×9): qty 1

## 2022-05-27 MED ORDER — ALBUTEROL SULFATE (2.5 MG/3ML) 0.083% IN NEBU: 2.5000 mg | INHALATION_SOLUTION | RESPIRATORY_TRACT | Status: DC | PRN

## 2022-05-27 MED ORDER — ORAL CARE MOUTH RINSE: 15.0000 mL | OROMUCOSAL | Status: DC | PRN

## 2022-05-27 MED ORDER — DULOXETINE HCL 30 MG PO CPEP
30.0000 mg | ORAL_CAPSULE | Freq: Two times a day (BID) | ORAL | Status: DC
  Administered 2022-05-27 – 2022-06-04 (×16): 30 mg via ORAL
  Filled 2022-05-27 (×16): qty 1

## 2022-05-27 MED ORDER — LACTULOSE 10 GM/15ML PO SOLN: 10.0000 g | Freq: Every day | ORAL | Status: DC | PRN

## 2022-05-27 NOTE — ED Triage Notes (Addendum)
Patient c/o bilateral lower back pain R>L.Luis [name] Patient had a fall 2 days ago. Patient went to [facility] walk-in clinic yesterday. Patient states he had an x-ray completed. Patient states he is to be admitted.  Patient has a back fracture and muscular dystrophy. Patient states that he had Dilaudid approx 3 hours ago. BP-99/63.

## 2022-05-27 NOTE — H&P (Signed)
History and Physical    Luis E Cashman Jr. PYP:[id number] DOB: 11/07/1955 DOA: 05/27/2022  PCP: [name], MD  Patient coming from: home  I have personally briefly reviewed patient's old medical records in [place]  Chief Complaint: severe back pain   HPI: Luis [name]. is a 66 y.o. male with medical history significant of Parkinson's disease on [place], Chronic severe neuropathic pain, Muscular dystrophy followed by dr [name] at [facility] clinic, chronic debility uses walker who presents with fall from standing around 3 days ago after which he has had increase pain in his lumbar region. Patient  contacted his pcp and was referred to ED after increasing chronic pain medication at home did not improve his symptoms. Patient notes pain is 10/10, notes he is unable to stand ,complete ADLs due to severe pain.   ED Course:  On evaluation patient was found to lumbar compression fracture yesterday at [facility] walk-in clinic. Patient also s/p fall had injury to had and was found to have fracture of right middle finger. IN ED repeat films:   Afeb, bp 99/63, hr 55, rr 16 sat 99%  Labs: Wbc5.9, plt 150 Na 137, K 4, cr < 0.3 Respiratory panel neg   CT lumbar spine IMPRESSION: 1. Acute L3 compression fracture with 35% height loss. 2. Chronic L5 compression fracture. 3. Unchanged lumbar disc and facet degeneration with mild spinal stenosis from L2-3 to L4-5 and moderate neural foraminal stenosis at L4-5. 4. Aortic Atherosclerosis (ICD10-I70.0).    Review of Systems: As per HPI otherwise 10 point review of systems negative.   Past Medical History:  Diagnosis Date   Bilateral foot-drop 10/19/2018   Bursitis, trochanteric    Episodic   Carpal tunnel syndrome on right    Degenerative disk disease    l5-S1   FSH (facioscapulohumeral muscular dystrophy) ([place]) 10/07/2017   Gait abnormality 10/19/2018   GERD (gastroesophageal reflux disease)    Hemorrhoids    with anal fissures    Left lateral epicondylitis    MS (multiple sclerosis) ([place])    Parkinson's disease ([place]) 10/19/2018   Skin cancer    right temple area    Past Surgical History:  Procedure Laterality Date   CHOLECYSTECTOMY  2007   HEMORRHOIDECTOMY WITH HEMORRHOID BANDING     IR KYPHO LUMBAR INC FX REDUCE BONE BX UNI/BIL CANNULATION INC/IMAGING  01/10/2020     reports that he has never smoked. He has never used smokeless tobacco. He reports that he does not drink alcohol and does not use drugs.  No Known Allergies  Family History  Problem Relation Age of Onset   Allergies Brother    Allergies Sister    Heart disease Father    Brain cancer Mother    Bone cancer Brother     Prior to Admission medications   Medication Sig Start Date End Date Taking? Authorizing Provider  carbidopa-levodopa (SINEMET CR) 50-200 MG tablet Take 1 tablet by mouth in the morning, at noon, and at bedtime. 10/18/21  Yes Marcial Pacas, MD  diazepam (VALIUM) 10 MG tablet Take 10 mg by mouth 2 (two) times daily as needed for anxiety. 05/02/22  Yes [provider]  Docusate Calcium (STOOL SOFTENER PO) Take 2 tablets by mouth at bedtime.   Yes [provider]  doxycycline (VIBRA-TABS) 100 MG tablet Take 100 mg by mouth 2 (two) times daily.   Yes [provider]  DULoxetine (CYMBALTA) 30 MG capsule Take 30 mg by mouth 2 (two) times  daily. 03/26/22  Yes [provider]  gabapentin (NEURONTIN) 300 MG capsule Take 600 mg by mouth every evening.   Yes [provider]  HYDROmorphone (DILAUDID) 8 MG tablet Take 8 mg by mouth in the morning, at noon, in the evening, and at bedtime. 11/16/20  Yes [provider]  lactulose (CHRONULAC) 10 GM/15ML solution Take 15 mLs (10 g total) by mouth daily as needed for mild constipation. 10/22/21  Yes Genia Harold, MD  polyethylene glycol (MIRALAX / GLYCOLAX) 17 g packet Take 17 g by mouth at bedtime.   Yes [provider]  prednisoLONE  acetate (PRED FORTE) 1 % ophthalmic suspension Place 1 drop into both eyes every other day. 03/04/18  Yes [provider]  Psyllium (METAMUCIL PO) Take 1 tablet by mouth daily.   Yes [provider]  TIMOLOL MALEATE OCUDOSE 0.5 % SOLN Place 1 drop into both eyes 2 (two) times daily. 05/20/22  Yes [provider]  zolpidem (AMBIEN) 10 MG tablet Take 10 mg by mouth at bedtime as needed for sleep.   Yes [provider]  gabapentin (NEURONTIN) 600 MG tablet Take 1 tablet (600 mg total) by mouth 4 (four) times daily. Patient not taking: Reported on 05/27/2022 10/18/21   Marcial Pacas, MD  naloxegol oxalate (MOVANTIK) 25 MG TABS tablet Take 25 mg by mouth daily as needed (For constipation). Patient not taking: Reported on 05/27/2022    [provider]    Physical Exam: Vitals:   05/27/22 1303 05/27/22 1315 05/27/22 1330 05/27/22 1415  BP: 134/81 117/80 133/80 114/80  Pulse: (!) 59 (!) 58 63 (!) 56  Resp: 18   16  Temp:      TempSrc:      SpO2: 100% 100% 100% 98%  Weight:      Height:         Vitals:   05/27/22 1303 05/27/22 1315 05/27/22 1330 05/27/22 1415  BP: 134/81 117/80 133/80 114/80  Pulse: (!) 59 (!) 58 63 (!) 56  Resp: 18   16  Temp:      TempSrc:      SpO2: 100% 100% 100% 98%  Weight:      Height:      Constitutional: NAD, calm, intermittently  uncomfortable Eyes: PERRL, lids and conjunctivae normal ENMT: Mucous membranes are moist. Posterior pharynx clear of any exudate or lesions.Normal dentition.  Neck: normal, supple, no masses, no thyromegaly Respiratory: clear to auscultation bilaterally, no wheezing, no crackles. Normal respiratory effort. No accessory muscle use.  Cardiovascular: Regular rate and rhythm, no murmurs / rubs / gallops. No extremity edema. 2+ pedal pulses.  Abdomen: no tenderness, no masses palpated. No hepatosplenomegaly. Bowel sounds positive.  Musculoskeletal: no clubbing / cyanosis. No joint deformity upper and  lower extremities. Good ROM, no contractures. Normal muscle tone.  Skin: no rashes, lesions, ulcers. No induration Neurologic: CN 2-12 grossly intact. Sensation intact, Strength 4/5 upper extremity  3/5  lower extremity .  Psychiatric: Normal judgment and insight. Alert and oriented x 3. Normal mood.    Labs on Admission: I have personally reviewed following labs and imaging studies  CBC: Recent Labs  Lab 05/27/22 1025  WBC 5.9  NEUTROABS 4.6  HGB 14.9  HCT 45.2  MCV 98.7  PLT 740   Basic Metabolic Panel: Recent Labs  Lab 05/27/22 1025  NA 137  K 4.0  CL 104  CO2 24  GLUCOSE 87  BUN 8  CREATININE <0.30*  CALCIUM 9.0  GFR: CrCl cannot be calculated (This lab value cannot be used to calculate CrCl because it is not a number: <0.30). Liver Function Tests: Recent Labs  Lab 05/27/22 1025  AST 19  ALT 10  ALKPHOS 91  BILITOT 0.9  PROT 6.6  ALBUMIN 3.6   No results for input(s): "LIPASE", "AMYLASE" in the last 168 hours. No results for input(s): "AMMONIA" in the last 168 hours. Coagulation Profile: No results for input(s): "INR", "PROTIME" in the last 168 hours. Cardiac Enzymes: No results for input(s): "CKTOTAL", "CKMB", "CKMBINDEX", "TROPONINI" in the last 168 hours. BNP (last 3 results) No results for input(s): "PROBNP" in the last 8760 hours. HbA1C: No results for input(s): "HGBA1C" in the last 72 hours. CBG: No results for input(s): "GLUCAP" in the last 168 hours. Lipid Profile: No results for input(s): "CHOL", "HDL", "LDLCALC", "TRIG", "CHOLHDL", "LDLDIRECT" in the last 72 hours. Thyroid Function Tests: No results for input(s): "TSH", "T4TOTAL", "FREET4", "T3FREE", "THYROIDAB" in the last 72 hours. Anemia Panel: No results for input(s): "VITAMINB12", "FOLATE", "FERRITIN", "TIBC", "IRON", "RETICCTPCT" in the last 72 hours. Urine analysis:    Component Value Date/Time   COLORURINE YELLOW 05/27/2022 [place] 05/27/2022 1206   LABSPEC  1.023 05/27/2022 1206   PHURINE 5.0 05/27/2022 1206   GLUCOSEU NEGATIVE 05/27/2022 1206   HGBUR SMALL (A) 05/27/2022 1206   BILIRUBINUR NEGATIVE 05/27/2022 1206   KETONESUR 5 (A) 05/27/2022 1206   PROTEINUR NEGATIVE 05/27/2022 1206   NITRITE NEGATIVE 05/27/2022 1206   LEUKOCYTESUR NEGATIVE 05/27/2022 1206    Radiological Exams on Admission: DG Finger Middle Right  Result Date: 05/27/2022 CLINICAL DATA:  Fall clinically with distal phalanx bruising and fracture EXAM: RIGHT MIDDLE FINGER 2+V COMPARISON:  May 26, 2022 FINDINGS: Osteopenia. Revisualization of a fracture of the base of the distal third phalanx with intra-articular extension. Fracture is in unchanged alignment. Associated soft tissue edema. No additional fractures noted. IMPRESSION: Similar appearance of a fracture of the base of the distal third phalanx. Electronically Signed   By: Valentino Saxon M.D.   On: 05/27/2022 13:55   CT Lumbar Spine Wo Contrast  Result Date: 05/27/2022 CLINICAL DATA:  Lumbar compression fracture. Back pain. Recent fall. EXAM: CT LUMBAR SPINE WITHOUT CONTRAST TECHNIQUE: Multidetector CT imaging of the lumbar spine was performed without intravenous contrast administration. Multiplanar CT image reconstructions were also generated. RADIATION DOSE REDUCTION: This exam was performed according to the departmental dose-optimization program which includes automated exposure control, adjustment of the mA and/or kV according to patient size and/or use of iterative reconstruction technique. COMPARISON:  Lumbar spine radiographs 05/26/2022. Lumbar spine MRI 07/14/2020. FINDINGS: Segmentation: 5 lumbar type vertebrae. Alignment: No significant listhesis. Vertebrae: Acute fracture of the L3 vertebral body with compression of the superior and inferior endplates resulting in 18% vertebral body height loss. No retropulsion or posterior element fracture. Chronic, previously augmented L5 compression fracture. No  suspicious osseous lesion. Paraspinal and other soft tissues: Marked fatty atrophy of the posterior paraspinal musculature bilaterally with history of muscular dystrophy. Asymmetric atrophy of the right psoas muscle. Mild paravertebral soft tissue edema at L3. Minimal abdominal aortic atherosclerosis without aneurysm. Disc levels: T12-L1: Negative. L1-2: Mild disc bulging and mild facet arthrosis without evidence of significant stenosis, similar to the prior MRI. L2-3: Disc bulging and moderate facet hypertrophy result in mild spinal stenosis and mild right greater than left neural foraminal stenosis, similar to the prior MRI. L3-4: Circumferential disc bulging greater to the left and moderate facet hypertrophy result in  mild spinal stenosis and mild left greater than right neural foraminal stenosis, similar to the prior MRI. L4-5: Moderate disc space narrowing. Circumferential disc bulging and moderate facet hypertrophy result in mild spinal stenosis and moderate bilateral neural foraminal stenosis, similar to the prior MRI. L5-S1: Disc bulging, a chronic left foraminal disc protrusion, and mild right and moderate left facet hypertrophy result in mild-to-moderate left neural foraminal stenosis without spinal stenosis, similar to the prior MRI. IMPRESSION: 1. Acute L3 compression fracture with 35% height loss. 2. Chronic L5 compression fracture. 3. Unchanged lumbar disc and facet degeneration with mild spinal stenosis from L2-3 to L4-5 and moderate neural foraminal stenosis at L4-5. 4. Aortic Atherosclerosis (ICD10-I70.0). Electronically Signed   By: Logan Bores M.D.   On: 05/27/2022 12:00    EKG: Independently reviewed. N/a  Assessment/Plan  Acute L3 compression fracture with 35% height loss -admit to med surg -supportive pain control  - ortho/spinal consulted  Dr Ronnald Ramp ,await final recs  -PT/Ot   Muscular dystrophy  Chronic debility  -PT/OT to see  -family and patient interested in short term rehab   -continue trilogy qhs  -followed by Dr Tillman Abide     Parkinson's disease  -cont on Sinemet  Chronic severe neuropathic pain,  -continue on gabapentin  -resume oral pain medication -dilaudid prn for breakthrough  -consider pain specialist consult in patient if patient pain not improved    DVT prophylaxis: heparin Code Status: DNR Family Communication: wife at bedside  Disposition Plan: patient  expected to be admitted greater than 2 midnights  Consults called: ortho/spine  Admission status: med tele   Clance Boll MD [facility]   If 7PM-7AM, please contact night-coverage [email or web address] Password [facility]  05/27/2022, 4:41 PM

## 2022-05-27 NOTE — Plan of Care (Signed)
  Problem: Nutrition: Goal: Adequate nutrition will be maintained Outcome: Progressing   Problem: Coping: Goal: Level of anxiety will decrease Outcome: Progressing   

## 2022-05-27 NOTE — ED Provider Triage Note (Signed)
Emergency Medicine Provider Triage Evaluation Note  Jordy Verba Trick [name]. , a 66 y.o. male  was evaluated in triage.  Pt complains of back pain. Patient fell on Saturday and has a known lumbar fracture seen on XR. He has muscular dystrophy and was sent in by PCP for admission and pain control. Patient has had gabapentin and dilaudid with mild relief. Denies any red flag symptoms  Review of Systems  Positive:  Negative:   Physical Exam  BP 99/63 (BP Location: Right Arm)   Pulse (!) 55   Temp 98.3 F (36.8 C) (Oral)   Resp 16   Ht '5\' 11"'$  (1.803 m)   Wt 68 kg   SpO2 99%   BMI 20.92 kg/m  Gen:   Awake, no distress   Resp:  Normal effort  MSK:   Moves extremities without difficulty  Other:  Compartments soft. Sensation intact.   Medical Decision Making  Medically screening exam initiated at 10:11 AM.  Appropriate orders placed.  Brooklyn E Younan [name]. was informed that the remainder of the evaluation will be completed by another provider, this initial triage assessment does not replace that evaluation, and the importance of remaining in the ED until their evaluation is complete.  CT and labs ordered.   Sherrell Puller, PA-C 05/27/22 1020

## 2022-05-27 NOTE — ED Notes (Signed)
Urinal at bedside pt will let me know when he needs to go.

## 2022-05-27 NOTE — ED Provider Notes (Addendum)
[facility] DEPT Provider Note   CSN: [id number] Arrival date & time: 05/27/22  9518     History  Chief Complaint  Patient presents with   Back Pain    Luis [name]. is a 66 y.o. male.  Patient followed closely by his primary care doctor Dr. Inda Merlin.  Patient had a fall on Saturday resulting in lumbar back pain.  Was seen at the [facility] walk-in clinic yesterday had x-rays showing a new compression fracture to the lumbar area and a fracture to his right middle finger.  The middle finger has been splinted.  Patient's been experiencing significant pain since Saturday.  They have been treating this with oral hydromorphone and benzo diazepam without much success.  Dr. Inda Merlin had a long discussion with Dr. Joette Catching yesterday and plan was to have patient come in for hospitalist admission pain control.  Patient been seen by [facility] in the past following a prior gluing of a prior lumbar fracture in 2020.  We will get them involved as well.  We will start pain control medicine.  Dr. Thereasa Solo stated that he would update today's hospitalist regarding the need for admission.  Patient's fall on Saturday he just lost his balance and went straight down.  No other injuries and the injury to the right hand and the lumbar back area.  Past medical history does list multiple sclerosis the muscular dystrophy Parkinson disease.       Home Medications Prior to Admission medications   Medication Sig Start Date End Date Taking? Authorizing Provider  carbidopa-levodopa (SINEMET CR) 50-200 MG tablet Take 1 tablet by mouth in the morning, at noon, and at bedtime. 10/18/21   Marcial Pacas, MD  Carboxymethylcellulose Sodium (REFRESH TEARS OP) Place 1 drop into both eyes 6 (six) times daily.    [provider]  Docusate Calcium (STOOL SOFTENER PO) Take 2 tablets by mouth at bedtime.    [provider]  doxycycline (VIBRA-TABS) 100 MG tablet Take 100 mg by  mouth 2 (two) times daily.    [provider]  DULoxetine (CYMBALTA) 30 MG capsule Take 30 mg by mouth 2 (two) times daily. 03/26/22   [provider]  gabapentin (NEURONTIN) 600 MG tablet Take 1 tablet (600 mg total) by mouth 4 (four) times daily. 10/18/21   Marcial Pacas, MD  HYDROmorphone (DILAUDID) 8 MG tablet 8 mg in the morning, at noon, in the evening, and at bedtime. 11/16/20   [provider]  lactulose (CHRONULAC) 10 GM/15ML solution Take 15 mLs (10 g total) by mouth daily as needed for mild constipation. 10/22/21   Genia Harold, MD  naloxegol oxalate (MOVANTIK) 25 MG TABS tablet Take 25 mg by mouth daily as needed.     [provider]  polyethylene glycol (MIRALAX / GLYCOLAX) 17 g packet Take 17 g by mouth at bedtime.    [provider]  prednisoLONE acetate (PRED FORTE) 1 % ophthalmic suspension Place 1 drop into both eyes in the morning and at bedtime.  03/04/18   [provider]  Psyllium (METAMUCIL PO) Take 1 tablet by mouth daily.    [provider]  timolol (TIMOPTIC) 0.5 % ophthalmic solution 1 drop daily. 01/20/20   [provider]  zolpidem (AMBIEN) 10 MG tablet Take 10 mg by mouth at bedtime as needed for sleep.    [provider]      Allergies    Patient has no known allergies.    Review  of Systems   Review of Systems  Constitutional:  Negative for chills and fever.  HENT:  Negative for ear pain and sore throat.   Eyes:  Negative for pain and visual disturbance.  Respiratory:  Negative for cough and shortness of breath.   Cardiovascular:  Negative for chest pain and palpitations.  Gastrointestinal:  Negative for abdominal pain and vomiting.  Genitourinary:  Negative for dysuria and hematuria.  Musculoskeletal:  Positive for back pain. Negative for arthralgias.  Skin:  Negative for color change and rash.  Neurological:  Positive for tremors and weakness. Negative for seizures and syncope.  All  other systems reviewed and are negative.   Physical Exam Updated Vital Signs BP 114/72 (BP Location: Left Arm)   Pulse (!) 58   Temp 98.2 F (36.8 C) (Oral)   Resp 16   Ht 1.803 m ('5\' 11"'$ )   Wt 68 kg   SpO2 99%   BMI 20.92 kg/m  Physical Exam Vitals and nursing note reviewed.  Constitutional:      General: He is not in acute distress.    Appearance: He is well-developed.  HENT:     Head: Normocephalic and atraumatic.     Mouth/Throat:     Mouth: Mucous membranes are moist.  Eyes:     Extraocular Movements: Extraocular movements intact.     Conjunctiva/sclera: Conjunctivae normal.     Pupils: Pupils are equal, round, and reactive to light.  Cardiovascular:     Rate and Rhythm: Normal rate and regular rhythm.     Heart sounds: No murmur heard. Pulmonary:     Effort: Pulmonary effort is normal. No respiratory distress.     Breath sounds: Normal breath sounds.  Abdominal:     Palpations: Abdomen is soft.     Tenderness: There is no abdominal tenderness.  Musculoskeletal:        General: Tenderness present. No swelling.     Cervical back: Neck supple.     Comments: Upper lumbar area tenderness to palpation.  Generalized muscle wasting.  Skin:    General: Skin is warm and dry.     Capillary Refill: Capillary refill takes less than 2 seconds.  Neurological:     General: No focal deficit present.     Mental Status: He is alert. Mental status is at baseline.     Motor: Weakness present.  Psychiatric:        Mood and Affect: Mood normal.     ED Results / Procedures / Treatments   Labs (all labs ordered are listed, but only abnormal results are displayed) Labs Reviewed  CBC WITH DIFFERENTIAL/PLATELET - Abnormal; Notable for the following components:      Result Value   Lymphs Abs 0.6 (*)    All other components within normal limits  URINALYSIS, ROUTINE W REFLEX MICROSCOPIC - Abnormal; Notable for the following components:   Hgb urine dipstick SMALL (*)    Ketones,  ur 5 (*)    All other components within normal limits  RESP PANEL BY RT-PCR (FLU A&B, COVID) [id number]  COMPREHENSIVE METABOLIC PANEL    EKG None  Radiology CT Lumbar Spine Wo Contrast  Result Date: 05/27/2022 CLINICAL DATA:  Lumbar compression fracture. Back pain. Recent fall. EXAM: CT LUMBAR SPINE WITHOUT CONTRAST TECHNIQUE: Multidetector CT imaging of the lumbar spine was performed without intravenous contrast administration. Multiplanar CT image reconstructions were also generated. RADIATION DOSE REDUCTION: This exam was performed according to the departmental dose-optimization program which includes automated exposure control,  adjustment of the mA and/or kV according to patient size and/or use of iterative reconstruction technique. COMPARISON:  Lumbar spine radiographs 05/26/2022. Lumbar spine MRI 07/14/2020. FINDINGS: Segmentation: 5 lumbar type vertebrae. Alignment: No significant listhesis. Vertebrae: Acute fracture of the L3 vertebral body with compression of the superior and inferior endplates resulting in 60% vertebral body height loss. No retropulsion or posterior element fracture. Chronic, previously augmented L5 compression fracture. No suspicious osseous lesion. Paraspinal and other soft tissues: Marked fatty atrophy of the posterior paraspinal musculature bilaterally with history of muscular dystrophy. Asymmetric atrophy of the right psoas muscle. Mild paravertebral soft tissue edema at L3. Minimal abdominal aortic atherosclerosis without aneurysm. Disc levels: T12-L1: Negative. L1-2: Mild disc bulging and mild facet arthrosis without evidence of significant stenosis, similar to the prior MRI. L2-3: Disc bulging and moderate facet hypertrophy result in mild spinal stenosis and mild right greater than left neural foraminal stenosis, similar to the prior MRI. L3-4: Circumferential disc bulging greater to the left and moderate facet hypertrophy result in mild spinal stenosis and mild left  greater than right neural foraminal stenosis, similar to the prior MRI. L4-5: Moderate disc space narrowing. Circumferential disc bulging and moderate facet hypertrophy result in mild spinal stenosis and moderate bilateral neural foraminal stenosis, similar to the prior MRI. L5-S1: Disc bulging, a chronic left foraminal disc protrusion, and mild right and moderate left facet hypertrophy result in mild-to-moderate left neural foraminal stenosis without spinal stenosis, similar to the prior MRI. IMPRESSION: 1. Acute L3 compression fracture with 35% height loss. 2. Chronic L5 compression fracture. 3. Unchanged lumbar disc and facet degeneration with mild spinal stenosis from L2-3 to L4-5 and moderate neural foraminal stenosis at L4-5. 4. Aortic Atherosclerosis (ICD10-I70.0). Electronically Signed   By: Logan Bores M.D.   On: 05/27/2022 12:00    Procedures Procedures    Medications Ordered in ED Medications  ondansetron (ZOFRAN) injection 4 mg (has no administration in time range)  HYDROmorphone (DILAUDID) injection 1 mg (has no administration in time range)    ED Course/ Medical Decision Making/ A&P                           Medical Decision Making Amount and/or Complexity of Data Reviewed Radiology: ordered.  Risk Prescription drug management. Decision regarding hospitalization.   We will discuss with hospitalist.  Will discuss with on-call neurosurgery.  CT scan lumbar area here today is shows evidence of a 35% L3 compression fracture.  Also chronic L5 compression fracture.  Unchanged lumbar disc and facet degeneration  Will start pain relief with the hydromorphone and Zofran.  Nurse will establish IV.  Patient's urinalysis negative for any signs urinary tract infection.  CBC no leukocytosis hemoglobin 14.9.  Platelets are 150 K.  Complete metabolic panel pending.  The plan is for admission for pain control as per Dr. Geraldo Docker discussion with Joette Catching.  Discussed with  hospitalist who will admit.  Discussed with Dr. Ronnald Ramp from neurosurgery who will get Dr. [name] to see him in consultation.  They feel he is a candidate for a lumbar brace as well as a candidate for gluing procedure.  In addition we will get x-ray of the right middle finger it seems to be the distal phalanx that is involved.  Suspect that this will just be able to be splinted and would heal on its own does not require any follow-up by hand surgery.  But x-ray pending.  X-ray of the right  middle finger shows a distal phalanx fracture at the DIP joint.  We will rewrap the finger with a finger splint and Coban.  Follow-up with hand surgery would not be unreasonable this could result in a frozen joint.  But no acute intervention needed at this time.  Patient's COVID flu testing is negative complete metabolic panel still pending.  Final Clinical Impression(s) / ED Diagnoses Final diagnoses:  Compression fracture of L3 vertebra, initial encounter ([place])  Muscular dystrophy ([place])  Parkinson disease ([place])    Rx / DC Orders ED Discharge Orders     None         Fredia Sorrow, MD 05/27/22 1301    Fredia Sorrow, MD 05/27/22 1333    Fredia Sorrow, MD 05/27/22 1334    Fredia Sorrow, MD 05/27/22 1349

## 2022-05-28 ENCOUNTER — Inpatient Hospital Stay (HOSPITAL_COMMUNITY): Payer: Medicare Other

## 2022-05-28 DIAGNOSIS — G7100 Muscular dystrophy, unspecified: Secondary | ICD-10-CM | POA: Diagnosis not present

## 2022-05-28 DIAGNOSIS — S32030D Wedge compression fracture of third lumbar vertebra, subsequent encounter for fracture with routine healing: Secondary | ICD-10-CM

## 2022-05-28 DIAGNOSIS — G20 Parkinson's disease: Secondary | ICD-10-CM

## 2022-05-28 LAB — COMPREHENSIVE METABOLIC PANEL
ALT: <5 U/L (ref 0–44)
AST: 15 U/L (ref 15–41)
Albumin: 3 g/dL — ABNORMAL LOW (ref 3.5–5.0)
Alkaline Phosphatase: 81 U/L (ref 38–126)
Anion gap: 5 (ref 5–15)
BUN: 11 mg/dL (ref 8–23)
CO2: 30 mmol/L (ref 22–32)
Calcium: 8.7 mg/dL — ABNORMAL LOW (ref 8.9–10.3)
Chloride: 103 mmol/L (ref 98–111)
Creatinine, Ser: 0.31 mg/dL — ABNORMAL LOW (ref 0.61–1.24)
GFR, Estimated: >60 mL/min (ref >60)
Glucose, Bld: 97 mg/dL (ref 70–99)
Potassium: 4.2 mmol/L (ref 3.5–5.1)
Sodium: 138 mmol/L (ref 135–145)
Total Bilirubin: 0.8 mg/dL (ref 0.3–1.2)
Total Protein: 5.6 g/dL — ABNORMAL LOW (ref 6.5–8.1)

## 2022-05-28 LAB — CBC
HCT: 42.5 % (ref 39.0–52.0)
Hemoglobin: 14 g/dL (ref 13.0–17.0)
MCH: 32.2 pg (ref 26.0–34.0)
MCHC: 32.9 g/dL (ref 30.0–36.0)
MCV: 97.7 fL (ref 80.0–100.0)
Platelets: 130 10*3/uL — ABNORMAL LOW (ref 150–400)
RBC: 4.35 MIL/uL (ref 4.22–5.81)
RDW: 12.3 % (ref 11.5–15.5)
WBC: 6 10*3/uL (ref 4.0–10.5)
nRBC: 0 % (ref 0.0–0.2)

## 2022-05-28 LAB — HIV ANTIBODY (ROUTINE TESTING W REFLEX): HIV Screen 4th Generation wRfx: NONREACTIVE

## 2022-05-28 NOTE — Progress Notes (Signed)
Pt refused BiPAP.

## 2022-05-28 NOTE — Progress Notes (Signed)
Inpatient Rehabilitation Admissions Coordinator   Rehab consult received..I await neurosurgery consult to assist with planning rehab venue options.  Danne Baxter, RN, MSN Rehab Admissions Coordinator [phone] 05/28/2022 6:02 PM

## 2022-05-28 NOTE — Progress Notes (Addendum)
PROGRESS NOTE    Luis E Gable Jr.  XTG:[id number] DOB: 01/22/1956 DOA: 05/27/2022 PCP: [name], MD    Chief Complaint  Patient presents with   Back Pain    Brief Narrative:  Luis Quails Sonier [name]. is a 66 y.o. male with medical history significant of Parkinson's disease on [place], Chronic severe neuropathic pain, Muscular dystrophy followed by Dr [name] at [facility], chronic debility uses walker who presents with fall from standing around 3 days ago after which he has had increase pain in his lumbar region. Patient  contacted his pcp and was referred to ED after increasing chronic pain medication at home did not improve his symptoms. CT lumbar spine shows Acute L3 compression fracture with 35% height loss. Chronic L5 compression fracture Assessment & Plan:   Principal Problem:   Compression fracture of lumbar vertebra (HCC)   Compression Fracture of L3 Pain control.  TSLO brace for pain control.  Therapy eval recommending CIR. Requested.  Neurosurgery consulted, Spoke to Dr Shawna Clamp who will speak with Dr Audry Riles regarding kyphoplasty.     Parkinson's disease:  Continue with Sinemet    Muscular Dystrophy:  Therapy evaluation.   Mild thrombocytopenia;  Monitor.    DVT prophylaxis: Heparin Code Status: DNR Family Communication: (wife at bedside.  Disposition:   Status is: Inpatient Remains inpatient appropriate because: pain control and CIR.    Level of care: Med-Surg Consultants:  Neuro surgery.   Procedures: None.   Antimicrobials: none.    Subjective: Wants to get up and work with PT.   Objective: Vitals:   05/27/22 2102 05/28/22 0207 05/28/22 0559 05/28/22 0934  BP: 122/76 108/68 110/66 113/69  Pulse: 75 (!) 59 69 71  Resp: '17 18 17 16  '$ Temp: 98.6 F (37 C) 99.7 F (37.6 C) 98.6 F (37 C) 98.4 F (36.9 C)  TempSrc:    Oral  SpO2: 96% 98% 95% 98%  Weight:      Height:        Intake/Output Summary (Last 24 hours) at 05/28/2022  1326 Last data filed at 05/28/2022 0034 Gross per 24 hour  Intake 30 ml  Output 560 ml  Net -530 ml   Filed Weights   05/27/22 1010  Weight: 68 kg    Examination:  General exam: Appears calm and comfortable  Respiratory system: Clear to auscultation. Respiratory effort normal. Cardiovascular system: S1 & S2 heard, RRR. No JVD, . No pedal edema. Gastrointestinal system: Abdomen is nondistended, soft and nontender. . Normal bowel sounds heard. Central nervous system: Alert and oriented. No focal neurological deficits. Extremities: no pedal edema.  Skin: No rashes, lesions or ulcers Psychiatry: . Mood & affect appropriate.     Data Reviewed: I have personally reviewed following labs and imaging studies  CBC: Recent Labs  Lab 05/27/22 1025 05/27/22 1712 05/28/22 0342  WBC 5.9 5.7 6.0  NEUTROABS 4.6  --   --   HGB 14.9 15.4 14.0  HCT 45.2 48.4 42.5  MCV 98.7 100.6* 97.7  PLT 150 150 130*    Basic Metabolic Panel: Recent Labs  Lab 05/27/22 1025 05/27/22 1712 05/28/22 0342  NA 137  --  138  K 4.0  --  4.2  CL 104  --  103  CO2 24  --  30  GLUCOSE 87  --  97  BUN 8  --  11  CREATININE <0.30* <0.30* 0.31*  CALCIUM 9.0  --  8.7*    GFR: Estimated Creatinine Clearance:  86.2 mL/min (A) (by C-G formula based on SCr of 0.31 mg/dL (L)).  Liver Function Tests: Recent Labs  Lab 05/27/22 1025 05/28/22 0342  AST 19 15  ALT 10 <5  ALKPHOS 91 81  BILITOT 0.9 0.8  PROT 6.6 5.6*  ALBUMIN 3.6 3.0*    CBG: No results for input(s): "GLUCAP" in the last 168 hours.   Recent Results (from the past 240 hour(s))  Resp Panel by RT-PCR (Flu A&B, Covid) Anterior Nasal Swab     Status: None   Collection Time: 05/27/22 11:11 AM   Specimen: Anterior Nasal Swab  Result Value Ref Range Status   SARS Coronavirus 2 by RT PCR NEGATIVE NEGATIVE Final    Comment: (NOTE) SARS-CoV-2 target nucleic acids are NOT DETECTED.  The SARS-CoV-2 RNA is generally detectable in upper  respiratory specimens during the acute phase of infection. The lowest concentration of SARS-CoV-2 viral copies this assay can detect is 138 copies/mL. A negative result does not preclude SARS-Cov-2 infection and should not be used as the sole basis for treatment or other patient management decisions. A negative result may occur with  improper specimen collection/handling, submission of specimen other than nasopharyngeal swab, presence of viral mutation(s) within the areas targeted by this assay, and inadequate number of viral copies(<138 copies/mL). A negative result must be combined with clinical observations, patient history, and epidemiological information. The expected result is Negative.  Fact Sheet for Patients:  EntrepreneurPulse.com.au  Fact Sheet for Healthcare Providers:  IncredibleEmployment.be  This test is no t yet approved or cleared by the [place] FDA and  has been authorized for detection and/or diagnosis of SARS-CoV-2 by FDA under an Emergency Use Authorization (EUA). This EUA will remain  in effect (meaning this test can be used) for the duration of the COVID-19 declaration under Section 564(b)(1) of the Act, 21 U.S.C.section 360bbb-3(b)(1), unless the authorization is terminated  or revoked sooner.       Influenza A by PCR NEGATIVE NEGATIVE Final   Influenza B by PCR NEGATIVE NEGATIVE Final    Comment: (NOTE) The Xpert Xpress SARS-CoV-2/FLU/RSV plus assay is intended as an aid in the diagnosis of influenza from Nasopharyngeal swab specimens and should not be used as a sole basis for treatment. Nasal washings and aspirates are unacceptable for Xpert Xpress SARS-CoV-2/FLU/RSV testing.  Fact Sheet for Patients: EntrepreneurPulse.com.au  Fact Sheet for Healthcare Providers: IncredibleEmployment.be  This test is not yet approved or cleared by the [place] FDA and has been  authorized for detection and/or diagnosis of SARS-CoV-2 by FDA under an Emergency Use Authorization (EUA). This EUA will remain in effect (meaning this test can be used) for the duration of the COVID-19 declaration under Section 564(b)(1) of the Act, 21 U.S.C. section 360bbb-3(b)(1), unless the authorization is terminated or revoked.  Performed at [facility], [place] [address], [place], [place] [address]          Radiology Studies: DG Finger Middle Right  Result Date: 05/27/2022 CLINICAL DATA:  Fall clinically with distal phalanx bruising and fracture EXAM: RIGHT MIDDLE FINGER 2+V COMPARISON:  May 26, 2022 FINDINGS: Osteopenia. Revisualization of a fracture of the base of the distal third phalanx with intra-articular extension. Fracture is in unchanged alignment. Associated soft tissue edema. No additional fractures noted. IMPRESSION: Similar appearance of a fracture of the base of the distal third phalanx. Electronically Signed   By: Valentino Saxon M.D.   On: 05/27/2022 13:55   CT Lumbar Spine Wo Contrast  Result  Date: 05/27/2022 CLINICAL DATA:  Lumbar compression fracture. Back pain. Recent fall. EXAM: CT LUMBAR SPINE WITHOUT CONTRAST TECHNIQUE: Multidetector CT imaging of the lumbar spine was performed without intravenous contrast administration. Multiplanar CT image reconstructions were also generated. RADIATION DOSE REDUCTION: This exam was performed according to the departmental dose-optimization program which includes automated exposure control, adjustment of the mA and/or kV according to patient size and/or use of iterative reconstruction technique. COMPARISON:  Lumbar spine radiographs 05/26/2022. Lumbar spine MRI 07/14/2020. FINDINGS: Segmentation: 5 lumbar type vertebrae. Alignment: No significant listhesis. Vertebrae: Acute fracture of the L3 vertebral body with compression of the superior and inferior endplates resulting in 20% vertebral body height  loss. No retropulsion or posterior element fracture. Chronic, previously augmented L5 compression fracture. No suspicious osseous lesion. Paraspinal and other soft tissues: Marked fatty atrophy of the posterior paraspinal musculature bilaterally with history of muscular dystrophy. Asymmetric atrophy of the right psoas muscle. Mild paravertebral soft tissue edema at L3. Minimal abdominal aortic atherosclerosis without aneurysm. Disc levels: T12-L1: Negative. L1-2: Mild disc bulging and mild facet arthrosis without evidence of significant stenosis, similar to the prior MRI. L2-3: Disc bulging and moderate facet hypertrophy result in mild spinal stenosis and mild right greater than left neural foraminal stenosis, similar to the prior MRI. L3-4: Circumferential disc bulging greater to the left and moderate facet hypertrophy result in mild spinal stenosis and mild left greater than right neural foraminal stenosis, similar to the prior MRI. L4-5: Moderate disc space narrowing. Circumferential disc bulging and moderate facet hypertrophy result in mild spinal stenosis and moderate bilateral neural foraminal stenosis, similar to the prior MRI. L5-S1: Disc bulging, a chronic left foraminal disc protrusion, and mild right and moderate left facet hypertrophy result in mild-to-moderate left neural foraminal stenosis without spinal stenosis, similar to the prior MRI. IMPRESSION: 1. Acute L3 compression fracture with 35% height loss. 2. Chronic L5 compression fracture. 3. Unchanged lumbar disc and facet degeneration with mild spinal stenosis from L2-3 to L4-5 and moderate neural foraminal stenosis at L4-5. 4. Aortic Atherosclerosis (ICD10-I70.0). Electronically Signed   By: Logan Bores M.D.   On: 05/27/2022 12:00        Scheduled Meds:  carbidopa-levodopa  1 tablet Oral TID   docusate sodium  100 mg Oral QHS   DULoxetine  30 mg Oral BID   heparin  5,000 Units Subcutaneous Q8H   polyethylene glycol  17 g Oral QHS    Continuous Infusions:   LOS: 1 day        Luis Poisson, MD [facility]   To contact the attending provider between 7A-7P or the covering provider during after hours 7P-7A, please log into the web site [email or web address] and access using universal [place] password for that web site. If you do not have the password, please call the hospital operator.  05/28/2022, 1:26 PM

## 2022-05-28 NOTE — Evaluation (Addendum)
Physical Therapy Evaluation Patient Details Name: Luis [name] [name]. MRN: [id number] DOB: 1956-05-19 Today's Date: 05/28/2022  History of Present Illness  66 y.o. male with medical history significant of Parkinson's disease on Sinement, Chronic severe neuropathic pain, Muscular dystrophy followed by dr [name] at [facility] clinic, chronic debility uses walker who presents with fall from standing after which he has had increase pain in his lumbar region. Dx of L3 compression fx, R middle finger fx.  Clinical Impression  Pt admitted with above diagnosis. +2 mod to max assist for sit to stand x 2 trials, pt able to take a few steps with RW x 2 trials, distance limited by back pain and BLEs buckling. At baseline pt ambulates with a RW independently, uses a lift chair for sit to stand, denies falls other than the one just prior this admission in the past 6 months. Pt is very unsteady in standing, mod assist to prevent posterior loss of balance. Pt's wife present for PT eval and is very involved in pt's care. Pt would benefit from post-acute inpatient rehab due to multiple comorbidities affecting mobility (Muscular dystrophy and Parkinsons) as well as acute pain from L3 compression fracture.  Pt currently with functional limitations due to the deficits listed below (see PT Problem List). Pt will benefit from skilled PT to increase their independence and safety with mobility to allow discharge to the venue listed below.          Recommendations for follow up therapy are one component of a multi-disciplinary discharge planning process, led by the attending physician.  Recommendations may be updated based on patient status, additional functional criteria and insurance authorization.  Follow Up Recommendations Acute inpatient rehab (3hours/day)      Assistance Recommended at Discharge Frequent or constant Supervision/Assistance  Patient can return home with the following  Two people to help with walking and/or  transfers;A lot of help with bathing/dressing/bathroom;Assistance with cooking/housework;Assist for transportation;Help with stairs or ramp for entrance;Direct supervision/assist for medications management    Equipment Recommendations None recommended by PT  Recommendations for Other Services       Functional Status Assessment Patient has had a recent decline in their functional status and demonstrates the ability to make significant improvements in function in a reasonable and predictable amount of time.     Precautions / Restrictions Precautions Precautions: Back Required Braces or Orthoses: Spinal Brace Spinal Brace: Thoracolumbosacral orthotic Restrictions Weight Bearing Restrictions: No      Mobility  Bed Mobility               General bed mobility comments: sitting at EOB at start of session    Transfers Overall transfer level: Needs assistance Equipment used: Rolling walker (2 wheels) Transfers: Sit to/from Stand Sit to Stand: Max assist           General transfer comment: +2 sit to stand mod A from elevated bed, +2 max A from recliner sit to stand; posterior lean initially in standing    Ambulation/Gait Ambulation/Gait assistance: +2 physical assistance, +2 safety/equipment, Mod assist Gait Distance (Feet): 3 Feet Assistive device: Rolling walker (2 wheels) Gait Pattern/deviations: Step-to pattern, Decreased step length - right, Decreased step length - left, Knees buckling, Decreased dorsiflexion - right, Decreased dorsiflexion - left, Trunk flexed, Wide base of support Gait velocity: decr     General Gait Details: 3' x 2 trials, pt very unsteady, distance limited by "50/10" low back pain and BLEs buckling. Pt stood leaning against sink counter and against  elevated foot board of bed for ~2 minutes each trial.  Stairs            Wheelchair Mobility    Modified Rankin (Stroke Patients Only)       Balance Overall balance assessment: History of  Falls, Needs assistance Sitting-balance support: Feet supported, No upper extremity supported Sitting balance-Leahy Scale: Fair   Postural control: Posterior lean Standing balance support: Bilateral upper extremity supported, Reliant on assistive device for balance, During functional activity Standing balance-Leahy Scale: Poor Standing balance comment: mod A initially for standing balance 2* posterior lean, heavy reliance upon RW                             Pertinent Vitals/Pain Pain Assessment Pain Assessment: 0-10 Pain Score: 10-Worst pain ever Pain Location: low back    Home Living Family/patient expects to be discharged to:: Private residence Living Arrangements: Spouse/significant other Available Help at Discharge: Family;Available 24 hours/day Type of Home: House       Alternate Level Stairs-Number of Steps: stair lift Home Layout: Two level Home Equipment: Rolling Walker (2 wheels);Toilet riser;Shower seat;Grab bars - tub/shower;Wheelchair - manual Additional Comments: electric commode lift, no other falls in past 6 months, lift chair    Prior Function Prior Level of Function : Needs assist       Physical Assist : ADLs (physical)     Mobility Comments: walks with RW independently, uses lift chair, lift commode seat, h/o B foot drop (had AFOs but stopped wearing them bc they weren't effective) ADLs Comments: wife assists     Hand Dominance        Extremity/Trunk Assessment   Upper Extremity Assessment Upper Extremity Assessment: RUE deficits/detail;LUE deficits/detail;Generalized weakness RUE Deficits / Details: grip 4/5 B, elbows +3/5, shoulder elevation not functional LUE Deficits / Details: grip 4/5 B, elbows +3/5, shoulder elevation not functional    Lower Extremity Assessment Lower Extremity Assessment: Generalized weakness;LLE deficits/detail;RLE deficits/detail RLE Deficits / Details: absent ankle DF (baseline), knee ext +3/5, hip flexion  -3/5 RLE Coordination: decreased gross motor LLE Deficits / Details: absent ankle DF, knee ext -4/5, hip flexion +3/5 LLE Sensation: WNL LLE Coordination: decreased gross motor    Cervical / Trunk Assessment Cervical / Trunk Assessment: Kyphotic  Communication   Communication: No difficulties  Cognition Arousal/Alertness: Awake/alert Behavior During Therapy: WFL for tasks assessed/performed Overall Cognitive Status: Within Functional Limits for tasks assessed                                          General Comments      Exercises     Assessment/Plan    PT Assessment Patient needs continued PT services  PT Problem List Decreased strength;Decreased mobility;Decreased activity tolerance;Pain;Decreased balance       PT Treatment Interventions Gait training;Therapeutic exercise;Patient/family education;Functional mobility training;Therapeutic activities;DME instruction    PT Goals (Current goals can be found in the Care Plan section)  Acute Rehab PT Goals Patient Stated Goal: decrease pain, get some rehab PT Goal Formulation: With patient/family Time For Goal Achievement: 06/11/22 Potential to Achieve Goals: Good    Frequency Min 3X/week     Co-evaluation               AM-PAC PT "6 Clicks" Mobility  Outcome Measure Help needed turning from your back to your side while in  a flat bed without using bedrails?: A Lot Help needed moving from lying on your back to sitting on the side of a flat bed without using bedrails?: A Lot Help needed moving to and from a bed to a chair (including a wheelchair)?: Total Help needed standing up from a chair using your arms (e.g., wheelchair or bedside chair)?: Total Help needed to walk in hospital room?: Total Help needed climbing 3-5 steps with a railing? : Total 6 Click Score: 8    End of Session Equipment Utilized During Treatment: Back brace Activity Tolerance: Patient limited by fatigue;Patient limited by  pain Patient left: in chair;with call bell/phone within reach;with family/visitor present Nurse Communication: Mobility status;Patient requests pain meds PT Visit Diagnosis: Unsteadiness on feet (R26.81);Other abnormalities of gait and mobility (R26.89);History of falling (Z91.81);Muscle weakness (generalized) (M62.81);Difficulty in walking, not elsewhere classified (R26.2);Pain    Time: 1112-1203 PT Time Calculation (min) (ACUTE ONLY): 51 min   Charges:   PT Evaluation $PT Eval High Complexity: 1 High PT Treatments $Gait Training: 8-22 mins $Therapeutic Activity: 8-22 mins       Blondell Reveal Kistler PT 05/28/2022  Acute Rehabilitation Services  Office [phone]

## 2022-05-28 NOTE — Plan of Care (Signed)
  Problem: Activity: Goal: Risk for activity intolerance will decrease Outcome: Progressing   Problem: Nutrition: Goal: Adequate nutrition will be maintained Outcome: Progressing   Problem: Pain Managment: Goal: General experience of comfort will improve Outcome: Progressing   

## 2022-05-29 DIAGNOSIS — S32030D Wedge compression fracture of third lumbar vertebra, subsequent encounter for fracture with routine healing: Secondary | ICD-10-CM | POA: Diagnosis not present

## 2022-05-29 DIAGNOSIS — G20 Parkinson's disease: Secondary | ICD-10-CM | POA: Diagnosis not present

## 2022-05-29 MED ORDER — HEPARIN SODIUM (PORCINE) 5000 UNIT/ML IJ SOLN: 5000.0000 [IU] | Freq: Three times a day (TID) | INTRAMUSCULAR | Status: DC

## 2022-05-29 MED ORDER — HEPARIN SODIUM (PORCINE) 5000 UNIT/ML IJ SOLN
5000.0000 [IU] | Freq: Three times a day (TID) | INTRAMUSCULAR | Status: DC
  Administered 2022-05-29 – 2022-05-30 (×3): 5000 [IU] via SUBCUTANEOUS
  Filled 2022-05-29 (×3): qty 1

## 2022-05-29 NOTE — Final Consult Note (Signed)
I reviewed the patient's lumbar CT and MRI.  He has a L3 compression fracture without neural compression.  I would suggest conservative management with a lumbosacral corset and his pain should improve with time.  There is no need for kyphoplasty presently as he has not failed medical management.

## 2022-05-29 NOTE — Plan of Care (Signed)
  Problem: Clinical Measurements: Goal: Respiratory complications will improve Outcome: Not Applicable   Problem: Activity: Goal: Risk for activity intolerance will decrease Outcome: Not Progressing   Problem: Nutrition: Goal: Adequate nutrition will be maintained Outcome: Progressing   Problem: Pain Managment: Goal: General experience of comfort will improve Outcome: Progressing

## 2022-05-29 NOTE — Progress Notes (Signed)
[facility] PROGRESS NOTE   Luis E Zogg Jr. QQP:[id number] DOB: Oct 17, 1955 DOA: 05/27/2022  PCP: [name], MD  Brief History/Interval Summary: 66 y.o. male with medical history significant of Parkinson's disease on Sinement, Chronic severe neuropathic pain, Muscular dystrophy followed by Dr [name] at [facility] clinic, chronic debility uses walker who presented after sustaining a fall about 3 days prior to admission.  He had pain in his lower back region.  Imaging studies showed L3 compression fracture.  Patient was hospitalized for further management.    Consultants: Neurosurgery  Procedures: None    Subjective/Interval History: Patient complains of 8 out of 10 pain in the lower back.  Limiting his mobility.  Denies any chest pain shortness of breath nausea or vomiting.    Assessment/Plan:  L3 compression fracture Continue with pain medications.  TLSO brace.  Note by Dr. [name] from this morning reviewed.  He recommends conservative management.  We will cancel consult to interventional radiology.  Discussed with patient's wife who would like to discuss this further with Dr. [name]. Continue PT and OT evaluation.  They had recommended inpatient rehabitation.  Parkinson's disease Continue with Sinemet.  Muscular dystrophy Chronic.  Thrombocytopenia Low platelet counts noted yesterday.  We will recheck tomorrow.  DVT Prophylaxis: Subcutaneous heparin Code Status: DNR Family Communication: Discussed with patient and his wife Disposition Plan: inpatient rehabilitation is recommended  Status is: Inpatient Remains inpatient appropriate because: Intractable back pain    Medications: Scheduled:  carbidopa-levodopa  1 tablet Oral TID   docusate sodium  100 mg Oral QHS   DULoxetine  30 mg Oral BID   heparin  5,000 Units Subcutaneous Q8H   polyethylene glycol  17 g Oral QHS   Continuous: [id number]:[id number], diazepam, HYDROmorphone (DILAUDID) injection, lactulose,  ondansetron **OR** ondansetron (ZOFRAN) IV, mouth rinse  Antibiotics: Anti-infectives (From admission, onward)    None       Objective:  Vital Signs  Vitals:   05/28/22 1505 05/28/22 1816 05/28/22 2107 05/29/22 0543  BP: 128/85 114/71 116/73 110/70  Pulse: 71 76 83 60  Resp: '16 16 19 17  '$ Temp: 97.7 F (36.5 C) 98 F (36.7 C) 98.1 F (36.7 C) 98.7 F (37.1 C)  TempSrc: Oral     SpO2: 99% 98% 98% 98%  Weight:      Height:        Intake/Output Summary (Last 24 hours) at 05/29/2022 1232 Last data filed at 05/29/2022 0543 Gross per 24 hour  Intake 150 ml  Output 150 ml  Net 0 ml   Filed Weights   05/27/22 1010  Weight: 68 kg    General appearance: Awake alert.  In no distress Resp: Clear to auscultation bilaterally.  Normal effort Cardio: S1-S2 is normal regular.  No S3-S4.  No rubs murmurs or bruit GI: Abdomen is soft.  Nontender nondistended.  Bowel sounds are present normal.  No masses organomegaly Extremities: No edema.     Lab Results:  Data Reviewed: I have personally reviewed following labs and reports of the imaging studies  CBC: Recent Labs  Lab 05/27/22 1025 05/27/22 1712 05/28/22 0342  WBC 5.9 5.7 6.0  NEUTROABS 4.6  --   --   HGB 14.9 15.4 14.0  HCT 45.2 48.4 42.5  MCV 98.7 100.6* 97.7  PLT 150 150 130*    Basic Metabolic Panel: Recent Labs  Lab 05/27/22 1025 05/27/22 1712 05/28/22 0342  NA 137  --  138  K 4.0  --  4.2  CL 104  --  103  CO2 24  --  30  GLUCOSE 87  --  97  BUN 8  --  11  CREATININE <0.30* <0.30* 0.31*  CALCIUM 9.0  --  8.7*    GFR: Estimated Creatinine Clearance: 86.2 mL/min (A) (by C-G formula based on SCr of 0.31 mg/dL (L)).  Liver Function Tests: Recent Labs  Lab 05/27/22 1025 05/28/22 0342  AST 19 15  ALT 10 <5  ALKPHOS 91 81  BILITOT 0.9 0.8  PROT 6.6 5.6*  ALBUMIN 3.6 3.0*     Recent Results (from the past 240 hour(s))  Resp Panel by RT-PCR (Flu A&B, Covid) Anterior Nasal Swab     Status:  None   Collection Time: 05/27/22 11:11 AM   Specimen: Anterior Nasal Swab  Result Value Ref Range Status   SARS Coronavirus 2 by RT PCR NEGATIVE NEGATIVE Final    Comment: (NOTE) SARS-CoV-2 target nucleic acids are NOT DETECTED.  The SARS-CoV-2 RNA is generally detectable in upper respiratory specimens during the acute phase of infection. The lowest concentration of SARS-CoV-2 viral copies this assay can detect is 138 copies/mL. A negative result does not preclude SARS-Cov-2 infection and should not be used as the sole basis for treatment or other patient management decisions. A negative result may occur with  improper specimen collection/handling, submission of specimen other than nasopharyngeal swab, presence of viral mutation(s) within the areas targeted by this assay, and inadequate number of viral copies(<138 copies/mL). A negative result must be combined with clinical observations, patient history, and epidemiological information. The expected result is Negative.  Fact Sheet for Patients:  EntrepreneurPulse.com.au  Fact Sheet for Healthcare Providers:  IncredibleEmployment.be  This test is no t yet approved or cleared by the [place] FDA and  has been authorized for detection and/or diagnosis of SARS-CoV-2 by FDA under an Emergency Use Authorization (EUA). This EUA will remain  in effect (meaning this test can be used) for the duration of the COVID-19 declaration under Section 564(b)(1) of the Act, 21 U.S.C.section 360bbb-3(b)(1), unless the authorization is terminated  or revoked sooner.       Influenza A by PCR NEGATIVE NEGATIVE Final   Influenza B by PCR NEGATIVE NEGATIVE Final    Comment: (NOTE) The Xpert Xpress SARS-CoV-2/FLU/RSV plus assay is intended as an aid in the diagnosis of influenza from Nasopharyngeal swab specimens and should not be used as a sole basis for treatment. Nasal washings and aspirates are unacceptable  for Xpert Xpress SARS-CoV-2/FLU/RSV testing.  Fact Sheet for Patients: EntrepreneurPulse.com.au  Fact Sheet for Healthcare Providers: IncredibleEmployment.be  This test is not yet approved or cleared by the [place] FDA and has been authorized for detection and/or diagnosis of SARS-CoV-2 by FDA under an Emergency Use Authorization (EUA). This EUA will remain in effect (meaning this test can be used) for the duration of the COVID-19 declaration under Section 564(b)(1) of the Act, 21 U.S.C. section 360bbb-3(b)(1), unless the authorization is terminated or revoked.  Performed at [facility], [place] [address] [place], [place] [address]       Radiology Studies: MR LUMBAR SPINE WO CONTRAST  Result Date: 05/29/2022 CLINICAL DATA:  Compression fracture on 05/27/2022 CT lumbar spine EXAM: MRI LUMBAR SPINE WITHOUT CONTRAST TECHNIQUE: Multiplanar, multisequence MR imaging of the lumbar spine was performed. No intravenous contrast was administered. COMPARISON:  MRI lumbar spine 07/22/2020, correlation is also made with CT lumbar spine 05/27/2022 FINDINGS: Segmentation:  5 lumbar-type vertebral bodies. Alignment: Mild levocurvature. Straightening of the normal  lumbar lordosis. No listhesis. Vertebrae: Acute compression fracture of L3 with approximately 40% vertebral body height loss. No significant retropulsion of the posterior cortex. Redemonstrated compression deformity of L5, status post kyphoplasty, which appears unchanged from 07/14/2020. Approximately 4 mm retropulsion of the posterosuperior cortex of L5, unchanged. Increased T2 signal at the inferior aspect of L4 (series 10, image 5-8), without significant vertebral body height loss, possibly edema. Congenitally short pedicles, which narrow the AP diameter of the spinal canal. Conus medullaris and cauda equina: Conus extends to the L1 level. Conus and cauda equina appear normal.  Paraspinal and other soft tissues: Atrophy of the paraspinous and psoas muscles Disc levels: T12-L1: No significant disc bulge. No spinal canal stenosis or neural foraminal narrowing. L1-L2: Minimal disc bulge. Small left foraminal protrusion. Mild facet arthropathy. No spinal canal stenosis. No neural foraminal narrowing. L2-L3: Mild disc bulge. Mild facet arthropathy. Narrowing of the lateral recesses, which could affect the descending L3 nerve roots. Mild spinal canal stenosis, unchanged. No neural foraminal narrowing. L3-L4: Mild disc bulge with left foraminal and extreme lateral protrusion. Moderate facet arthropathy. Narrowing of the lateral recesses, which could affect the descending L4 nerve roots. Mild spinal canal stenosis, unchanged. No neural foraminal narrowing. L4-L5: Disc height loss and broad-based disc bulge. Retropulsion of the posterosuperior cortex of L5. Moderate facet arthropathy. Ligamentum flavum hypertrophy. Mild spinal canal stenosis. Narrowing of the lateral recesses, which could affect the descending L5 nerve roots. Mild left-greater-than-right neural foraminal narrowing, unchanged. L5-S1: Mild disc bulge. Mild facet arthropathy. No spinal canal stenosis. Mild-to-moderate left neural foraminal narrowing, unchanged. IMPRESSION: 1. Acute compression fracture of L3 with approximately 40% vertebral body height loss. 2. Unchanged compression deformity of L5, status post kyphoplasty. 3. Increased T2 signal at the inferior aspect of L4, likely edema, without significant vertebral body height loss to suggest additional compression fracture. 4. Otherwise stable degenerative changes, superimposed on short pedicles, which results in mild spinal canal stenosis at L2-L3, L3-L4, and L4-L5, as well as mild and mild-to-moderate neural foraminal narrowing, as described above. Electronically Signed   By: Merilyn Baba M.D.   On: 05/29/2022 01:35   DG Finger Middle Right  Result Date:  05/27/2022 CLINICAL DATA:  Fall clinically with distal phalanx bruising and fracture EXAM: RIGHT MIDDLE FINGER 2+V COMPARISON:  May 26, 2022 FINDINGS: Osteopenia. Revisualization of a fracture of the base of the distal third phalanx with intra-articular extension. Fracture is in unchanged alignment. Associated soft tissue edema. No additional fractures noted. IMPRESSION: Similar appearance of a fracture of the base of the distal third phalanx. Electronically Signed   By: Valentino Saxon M.D.   On: 05/27/2022 13:55       LOS: 2 days   [facility] Pager on [email or web address]  05/29/2022, 12:32 PM

## 2022-05-29 NOTE — Progress Notes (Signed)
  Inpatient Rehabilitation Admissions Coordinator   I spoke with patient and his wife by phone for rehab assessment. We discussed goals and expectations of a possible CIR admit. They prefer CIR for rehab. She states the final decision on kyphoplasty  is pending with Dr Inda Merlin, Dr [name] and IR. I feel patient can benefit from a CIR admit. I will discuss with Rehab MD and follow up tomorrow. Please call me with any questions.   Danne Baxter, RN, MSN Rehab Admissions Coordinator [phone]

## 2022-05-29 NOTE — Progress Notes (Signed)
Physical Therapy Treatment Patient Details Name: Luis [name]. MRN: [id number] DOB: [birth year] Today's Date: 05/29/2022   History of Present Illness 66 y.o. male with medical history significant of Parkinson's disease on S[place], Chronic severe neuropathic pain, Muscular dystrophy followed by dr [name] at [facility] clinic, chronic debility uses walker who presents with fall from standing after which he has had increase pain in his lumbar region. Dx of L3 compression fx, R middle finger fx.    PT Comments    Reviewed log roll technique, max A for sidelying to sit. Pt and wife refused use of TLSO. Pt performed sit to stand x 4 trials from highly elevated bed with RW and +2 min to mod assist. Pt has difficulty bringing hips over his feet in standing. He performed side stepping to the L with RW at edge of bed and ambulated 4' forwards and 4' backwards with RW and +2 min A for balance. He performed marching in standing with RW at edge of bed. Pt's spouse very involved in pt's care. She expressed much frustration and disappointment over the decision to not proceed with a kyphoplasty. Pt remains a good candidate for inpatient rehab.    Recommendations for follow up therapy are one component of a multi-disciplinary discharge planning process, led by the attending physician.  Recommendations may be updated based on patient status, additional functional criteria and insurance authorization.  Follow Up Recommendations  Acute inpatient rehab (3hours/day)     Assistance Recommended at Discharge Frequent or constant Supervision/Assistance  Patient can return home with the following Two people to help with walking and/or transfers;A lot of help with bathing/dressing/bathroom;Assistance with cooking/housework;Assist for transportation;Help with stairs or ramp for entrance;Direct supervision/assist for medications management   Equipment Recommendations  None recommended by PT    Recommendations for Other  Services       Precautions / Restrictions Precautions Precautions: Back Precaution Comments: reviewed log roll technique Required Braces or Orthoses: Spinal Brace Spinal Brace: Thoracolumbosacral orthotic Restrictions Weight Bearing Restrictions: No Other Position/Activity Restrictions: pt/wife refused TLSO today     Mobility  Bed Mobility Overal bed mobility: Needs Assistance Bed Mobility: Rolling, Sidelying to Sit Rolling: Min assist Sidelying to sit: +2 for physical assistance, Max assist       General bed mobility comments: assist to initiate roll; assist to raise trunk    Transfers Overall transfer level: Needs assistance Equipment used: Rolling walker (2 wheels) Transfers: Sit to/from Stand Sit to Stand: +2 safety/equipment, Mod assist, From elevated surface, Min assist           General transfer comment: pt stood from highly elevated bed x 4 trials with min to mod assist to power up with RW    Ambulation/Gait Ambulation/Gait assistance: +2 physical assistance, +2 safety/equipment, Mod assist, Min assist Gait Distance (Feet): 8 Feet Assistive device: Rolling walker (2 wheels) Gait Pattern/deviations: Step-to pattern, Decreased step length - right, Decreased step length - left, Decreased dorsiflexion - right, Decreased dorsiflexion - left, Trunk flexed, Wide base of support, Ataxic Gait velocity: decr     General Gait Details: 4' forward and 4' backward with RW. Pt also performed side stepping to L x 4' with RW at edge of bed. Distance limited by back pain.   Stairs             Wheelchair Mobility    Modified Rankin (Stroke Patients Only)       Balance Overall balance assessment: History of Falls, Needs assistance Sitting-balance support: Feet supported,  No upper extremity supported Sitting balance-Leahy Scale: Fair   Postural control: Posterior lean Standing balance support: Bilateral upper extremity supported, Reliant on assistive device  for balance, During functional activity Standing balance-Leahy Scale: Poor Standing balance comment: mod A initially for standing balance 2* posterior lean, heavy reliance upon RW                            Cognition Arousal/Alertness: Awake/alert Behavior During Therapy: WFL for tasks assessed/performed Overall Cognitive Status: Within Functional Limits for tasks assessed                                          Exercises General Exercises - Lower Extremity Hip Flexion/Marching: AROM, Both, 10 reps, Standing    General Comments        Pertinent Vitals/Pain Pain Assessment Pain Location: "100/10" low back Pain Descriptors / Indicators: Grimacing Pain Intervention(s): Limited activity within patient's tolerance, Monitored during session, Premedicated before session    Home Living                          Prior Function            PT Goals (current goals can now be found in the care plan section) Acute Rehab PT Goals Patient Stated Goal: decrease pain, get some rehab PT Goal Formulation: With patient/family Time For Goal Achievement: 06/11/22 Potential to Achieve Goals: Good Progress towards PT goals: Progressing toward goals    Frequency    Min 3X/week      PT Plan Current plan remains appropriate    Co-evaluation              AM-PAC PT "6 Clicks" Mobility   Outcome Measure  Help needed turning from your back to your side while in a flat bed without using bedrails?: A Little Help needed moving from lying on your back to sitting on the side of a flat bed without using bedrails?: A Lot Help needed moving to and from a bed to a chair (including a wheelchair)?: Total Help needed standing up from a chair using your arms (e.g., wheelchair or bedside chair)?: Total Help needed to walk in hospital room?: Total Help needed climbing 3-5 steps with a railing? : Total 6 Click Score: 9    End of Session   Activity  Tolerance: Patient limited by fatigue;Patient limited by pain Patient left: in bed;with bed alarm set;with call bell/phone within reach;with family/visitor present Nurse Communication: Mobility status PT Visit Diagnosis: Unsteadiness on feet (R26.81);Other abnormalities of gait and mobility (R26.89);History of falling (Z91.81);Muscle weakness (generalized) (M62.81);Difficulty in walking, not elsewhere classified (R26.2);Pain     Time: 2458-0998 PT Time Calculation (min) (ACUTE ONLY): 31 min  Charges:  $Gait Training: 8-22 mins $Therapeutic Activity: 8-22 mins                     Blondell Reveal Kistler PT 05/29/2022  Acute Rehabilitation Services  Office [phone]

## 2022-05-29 NOTE — Progress Notes (Signed)
Request received for kyphoplasty of L3 compression fracture. Procedure was approved by Dr. Debbrah Alar, NIR. Dr. [name] has consulted with pt and wife and all are in agreement to utilize conservative management at this time with TLSO brace, PT/OT and recommendation for inpatient rehabilitation.     Narda Rutherford, AGNP-BC 05/29/2022, 2:03 PM

## 2022-05-29 NOTE — Progress Notes (Signed)
Dr [name] office notified that patients wife would like to speak with him. Bethann Punches RN

## 2022-05-30 ENCOUNTER — Encounter (HOSPITAL_COMMUNITY): Payer: Self-pay | Admitting: Internal Medicine

## 2022-05-30 DIAGNOSIS — S32030D Wedge compression fracture of third lumbar vertebra, subsequent encounter for fracture with routine healing: Secondary | ICD-10-CM | POA: Diagnosis not present

## 2022-05-30 DIAGNOSIS — G20 Parkinson's disease: Secondary | ICD-10-CM | POA: Diagnosis not present

## 2022-05-30 LAB — BASIC METABOLIC PANEL
Anion gap: 7 (ref 5–15)
BUN: 9 mg/dL (ref 8–23)
CO2: 26 mmol/L (ref 22–32)
Calcium: 9.2 mg/dL (ref 8.9–10.3)
Chloride: 106 mmol/L (ref 98–111)
Creatinine, Ser: 0.4 mg/dL — ABNORMAL LOW (ref 0.61–1.24)
GFR, Estimated: >60 mL/min (ref >60)
Glucose, Bld: 115 mg/dL — ABNORMAL HIGH (ref 70–99)
Potassium: 4.3 mmol/L (ref 3.5–5.1)
Sodium: 139 mmol/L (ref 135–145)

## 2022-05-30 LAB — CBC
HCT: 41.6 % (ref 39.0–52.0)
Hemoglobin: 13.8 g/dL (ref 13.0–17.0)
MCH: 32.3 pg (ref 26.0–34.0)
MCHC: 33.2 g/dL (ref 30.0–36.0)
MCV: 97.4 fL (ref 80.0–100.0)
Platelets: 172 10*3/uL (ref 150–400)
RBC: 4.27 MIL/uL (ref 4.22–5.81)
RDW: 12.7 % (ref 11.5–15.5)
WBC: 4.5 10*3/uL (ref 4.0–10.5)
nRBC: 0 % (ref 0.0–0.2)

## 2022-05-30 LAB — PROTIME-INR
INR: 1 (ref 0.8–1.2)
Prothrombin Time: 12.6 seconds (ref 11.4–15.2)

## 2022-05-30 MED ORDER — POLYETHYLENE GLYCOL 3350 17 G PO PACK
17.0000 g | PACK | Freq: Every day | ORAL | Status: DC
  Administered 2022-05-30 – 2022-06-03 (×5): 17 g via ORAL
  Filled 2022-05-30 (×5): qty 1

## 2022-05-30 MED ORDER — PREDNISOLONE ACETATE 1 % OP SUSP
1.0000 [drp] | OPHTHALMIC | Status: DC
  Administered 2022-05-30 – 2022-06-02 (×3): 1 [drp] via OPHTHALMIC
  Filled 2022-05-30: qty 5

## 2022-05-30 MED ORDER — HYDROMORPHONE HCL 2 MG PO TABS
8.0000 mg | ORAL_TABLET | Freq: Four times a day (QID) | ORAL | Status: DC | PRN
  Administered 2022-05-30 – 2022-06-04 (×14): 8 mg via ORAL
  Filled 2022-05-30 (×14): qty 4

## 2022-05-30 MED ORDER — HEPARIN SODIUM (PORCINE) 5000 UNIT/ML IJ SOLN
5000.0000 [IU] | Freq: Three times a day (TID) | INTRAMUSCULAR | Status: DC
  Administered 2022-05-31 – 2022-06-04 (×11): 5000 [IU] via SUBCUTANEOUS
  Filled 2022-05-30 (×11): qty 1

## 2022-05-30 MED ORDER — CEFAZOLIN SODIUM-DEXTROSE 2-4 GM/100ML-% IV SOLN: 2.0000 g | INTRAVENOUS | Status: AC

## 2022-05-30 MED ORDER — TIMOLOL MALEATE 0.5 % OP SOLN
1.0000 [drp] | Freq: Two times a day (BID) | OPHTHALMIC | Status: DC
  Filled 2022-05-30 (×2): qty 5

## 2022-05-30 MED ORDER — HYDROMORPHONE HCL 1 MG/ML IJ SOLN
1.0000 mg | INTRAMUSCULAR | Status: DC | PRN
  Administered 2022-05-31: 1 mg via INTRAVENOUS
  Filled 2022-05-30: qty 1

## 2022-05-30 NOTE — Consult Note (Signed)
Chief Complaint: Patient was seen in consultation today for L3 compression fracture  Referring Physician(s): Dr. Newman Pies  Supervising Physician: Pedro Earls  Patient Status: [facility] - In-pt  History of Present Illness: Luis [name] Gloss Luis [name]. is a 66 y.o. male with past medical history of Parkinson's disease, muscular dystrophy, chronic debility, osteoporosis who is known to NIR from prior compression fracture treated with kyphoplasty by Dr. Karenann Cai in 2021.  Patient fall approximately 3 days ago at home with immediate, acute back pain.  This failed to improve with conservative measures at home and he presented to the ED due to severe pain.    MR Lumbar Spine showed: 1. Acute compression fracture of L3 with approximately 40% vertebral body height loss. 2. Unchanged compression deformity of L5, status post kyphoplasty. 3. Increased T2 signal at the inferior aspect of L4, likely edema, without significant vertebral body height loss to suggest additional compression fracture. 4. Otherwise stable degenerative changes, superimposed on short pedicles, which results in mild spinal canal stenosis at L2-L3, L3-L4, and L4-L5, as well as mild and mild-to-moderate neural foraminal narrowing, as described above.  He has been evaluated by Dr. [name], his neurosurgeon, who recommends proceeding with vertebroplasty/kyphoplasty.  IR consulted for same. Case reviewed by Dr. Karenann Cai who approves patient for the case.    PA assessed at bedside.  He continues to complain of severe back pain requiring IV and PO narcotic pain medication.  He endorses good results from his prior kyphoplasty with ability to return to baseline mobility more quickly.  He would like to proceed with vertebroplasty/kyphoplasty for improved pain management.   Plan for procedure tomorrow at [facility] with return to [facility].   Past Medical History:  Diagnosis Date   Bilateral foot-drop 10/19/2018    Bursitis, trochanteric    Episodic   Carpal tunnel syndrome on right    Degenerative disk disease    l5-S1   FSH (facioscapulohumeral muscular dystrophy) ([place]) 10/07/2017   Gait abnormality 10/19/2018   GERD (gastroesophageal reflux disease)    Hemorrhoids    with anal fissures   Left lateral epicondylitis    Parkinson's disease ([name]) 10/19/2018   Skin cancer    right temple area    Past Surgical History:  Procedure Laterality Date   CHOLECYSTECTOMY  2007   HEMORRHOIDECTOMY WITH HEMORRHOID BANDING     IR KYPHO LUMBAR INC FX REDUCE BONE BX UNI/BIL CANNULATION INC/IMAGING  01/10/2020    Allergies: Patient has no known allergies.  Medications: Prior to Admission medications   Medication Sig Start Date End Date Taking? Authorizing Provider  carbidopa-levodopa (SINEMET CR) 50-200 MG tablet Take 1 tablet by mouth in the morning, at noon, and at bedtime. 10/18/21  Yes Marcial Pacas, MD  diazepam (VALIUM) 10 MG tablet Take 10 mg by mouth 2 (two) times daily as needed for anxiety. 05/02/22  Yes [provider]  Docusate Calcium (STOOL SOFTENER PO) Take 2 tablets by mouth at bedtime.   Yes [provider]  doxycycline (VIBRA-TABS) 100 MG tablet Take 100 mg by mouth 2 (two) times daily.   Yes [provider]  DULoxetine (CYMBALTA) 30 MG capsule Take 30 mg by mouth 2 (two) times daily. 03/26/22  Yes [provider]  gabapentin (NEURONTIN) 300 MG capsule Take 600 mg by mouth every evening.   Yes [provider]  HYDROmorphone (DILAUDID) 8 MG tablet Take 8 mg by mouth in the morning, at noon, in the evening, and at  bedtime. 11/16/20  Yes [provider]  lactulose (CHRONULAC) 10 GM/15ML solution Take 15 mLs (10 g total) by mouth daily as needed for mild constipation. 10/22/21  Yes Genia Harold, MD  polyethylene glycol (MIRALAX / GLYCOLAX) 17 g packet Take 17 g by mouth at bedtime.   Yes [provider]  prednisoLONE acetate (PRED  FORTE) 1 % ophthalmic suspension Place 1 drop into both eyes every other day. 03/04/18  Yes [provider]  Psyllium (METAMUCIL PO) Take 1 tablet by mouth daily.   Yes [provider]  TIMOLOL MALEATE OCUDOSE 0.5 % SOLN Place 1 drop into both eyes 2 (two) times daily. 05/20/22  Yes [provider]  zolpidem (AMBIEN) 10 MG tablet Take 10 mg by mouth at bedtime as needed for sleep.   Yes [provider]  gabapentin (NEURONTIN) 600 MG tablet Take 1 tablet (600 mg total) by mouth 4 (four) times daily. Patient not taking: Reported on 05/27/2022 10/18/21   Marcial Pacas, MD  naloxegol oxalate (MOVANTIK) 25 MG TABS tablet Take 25 mg by mouth daily as needed (For constipation). Patient not taking: Reported on 05/27/2022    [provider]     Family History  Problem Relation Age of Onset   Allergies Brother    Allergies Sister    Heart disease Father    Brain cancer Mother    Bone cancer Brother     Social History   Socioeconomic History   Marital status: Married    Spouse name: [name]   Number of children: 0   Years of education: Not on file   Highest education level: Not on file  Occupational History   Occupation: Programmer, [employer]  Tobacco Use   Smoking status: Never   Smokeless tobacco: Never  Vaping Use   Vaping Use: Never used  Substance and Sexual Activity   Alcohol use: No   Drug use: No   Sexual activity: Not on file  Other Topics Concern   Not on file  Social History Narrative   Lives with wife   Caffeine use: Sometimes tea   Right handed    Social Determinants of Health   Financial Resource Strain: Not on file  Food Insecurity: No Food Insecurity (05/27/2022)   Hunger Vital Sign    Worried About Running Out of Food in the Last Year: Never true    Ran Out of Food in the Last Year: Never true  Transportation Needs: No Transportation Needs (05/27/2022)   PRAPARE - Hydrologist  (Medical): No    Lack of Transportation (Non-Medical): No  Physical Activity: Not on file  Stress: Not on file  Social Connections: Not on file     Review of Systems: A 12 point ROS discussed and pertinent positives are indicated in the HPI above.  All other systems are negative.  Review of Systems  Constitutional:  Negative for fatigue and fever.  Respiratory:  Negative for cough and shortness of breath.   Cardiovascular:  Negative for chest pain.  Gastrointestinal:  Negative for abdominal pain, nausea and vomiting.  Musculoskeletal:  Positive for back pain.  Neurological:  Positive for weakness.  Psychiatric/Behavioral:  Negative for behavioral problems and confusion.     Vital Signs: BP 118/84 (BP Location: Right Arm)   Pulse 74   Temp (!) 97.5 F (36.4 C)   Resp 18   Ht 5' 11"  (1.803 m)   Wt 150 lb (68 kg)  SpO2 100%   BMI 20.92 kg/m   Physical Exam Vitals and nursing note reviewed.  Constitutional:      General: He is not in acute distress.    Appearance: Normal appearance. He is not ill-appearing.  HENT:     Mouth/Throat:     Mouth: Mucous membranes are moist.     Pharynx: Oropharynx is clear.  Cardiovascular:     Rate and Rhythm: Normal rate and regular rhythm.     Pulses: Normal pulses.  Pulmonary:     Effort: Pulmonary effort is normal.     Breath sounds: Normal breath sounds.  Abdominal:     Palpations: Abdomen is soft.  Musculoskeletal:        General: Tenderness (point tenderness in the lower back) present.  Skin:    General: Skin is warm and dry.  Neurological:     General: No focal deficit present.     Mental Status: He is alert and oriented to person, place, and time. Mental status is at baseline.  Psychiatric:        Mood and Affect: Mood normal.        Behavior: Behavior normal.        Thought Content: Thought content normal.        Judgment: Judgment normal.      MD Evaluation Airway: WNL Heart: WNL Abdomen: WNL Chest/ Lungs:  WNL ASA  Classification: 3 Mallampati/Airway Score: Two   Imaging: MR LUMBAR SPINE WO CONTRAST  Result Date: 05/29/2022 CLINICAL DATA:  Compression fracture on 05/27/2022 CT lumbar spine EXAM: MRI LUMBAR SPINE WITHOUT CONTRAST TECHNIQUE: Multiplanar, multisequence MR imaging of the lumbar spine was performed. No intravenous contrast was administered. COMPARISON:  MRI lumbar spine 07/22/2020, correlation is also made with CT lumbar spine 05/27/2022 FINDINGS: Segmentation:  5 lumbar-type vertebral bodies. Alignment: Mild levocurvature. Straightening of the normal lumbar lordosis. No listhesis. Vertebrae: Acute compression fracture of L3 with approximately 40% vertebral body height loss. No significant retropulsion of the posterior cortex. Redemonstrated compression deformity of L5, status post kyphoplasty, which appears unchanged from 07/14/2020. Approximately 4 mm retropulsion of the posterosuperior cortex of L5, unchanged. Increased T2 signal at the inferior aspect of L4 (series 10, image 5-8), without significant vertebral body height loss, possibly edema. Congenitally short pedicles, which narrow the AP diameter of the spinal canal. Conus medullaris and cauda equina: Conus extends to the L1 level. Conus and cauda equina appear normal. Paraspinal and other soft tissues: Atrophy of the paraspinous and psoas muscles Disc levels: T12-L1: No significant disc bulge. No spinal canal stenosis or neural foraminal narrowing. L1-L2: Minimal disc bulge. Small left foraminal protrusion. Mild facet arthropathy. No spinal canal stenosis. No neural foraminal narrowing. L2-L3: Mild disc bulge. Mild facet arthropathy. Narrowing of the lateral recesses, which could affect the descending L3 nerve roots. Mild spinal canal stenosis, unchanged. No neural foraminal narrowing. L3-L4: Mild disc bulge with left foraminal and extreme lateral protrusion. Moderate facet arthropathy. Narrowing of the lateral recesses, which could affect  the descending L4 nerve roots. Mild spinal canal stenosis, unchanged. No neural foraminal narrowing. L4-L5: Disc height loss and broad-based disc bulge. Retropulsion of the posterosuperior cortex of L5. Moderate facet arthropathy. Ligamentum flavum hypertrophy. Mild spinal canal stenosis. Narrowing of the lateral recesses, which could affect the descending L5 nerve roots. Mild left-greater-than-right neural foraminal narrowing, unchanged. L5-S1: Mild disc bulge. Mild facet arthropathy. No spinal canal stenosis. Mild-to-moderate left neural foraminal narrowing, unchanged. IMPRESSION: 1. Acute compression fracture of L3 with approximately 40%  vertebral body height loss. 2. Unchanged compression deformity of L5, status post kyphoplasty. 3. Increased T2 signal at the inferior aspect of L4, likely edema, without significant vertebral body height loss to suggest additional compression fracture. 4. Otherwise stable degenerative changes, superimposed on short pedicles, which results in mild spinal canal stenosis at L2-L3, L3-L4, and L4-L5, as well as mild and mild-to-moderate neural foraminal narrowing, as described above. Electronically Signed   By: Merilyn Baba M.D.   On: 05/29/2022 01:35   DG Finger Middle Right  Result Date: 05/27/2022 CLINICAL DATA:  Fall clinically with distal phalanx bruising and fracture EXAM: RIGHT MIDDLE FINGER 2+V COMPARISON:  May 26, 2022 FINDINGS: Osteopenia. Revisualization of a fracture of the base of the distal third phalanx with intra-articular extension. Fracture is in unchanged alignment. Associated soft tissue edema. No additional fractures noted. IMPRESSION: Similar appearance of a fracture of the base of the distal third phalanx. Electronically Signed   By: Valentino Saxon M.D.   On: 05/27/2022 13:55   CT Lumbar Spine Wo Contrast  Result Date: 05/27/2022 CLINICAL DATA:  Lumbar compression fracture. Back pain. Recent fall. EXAM: CT LUMBAR SPINE WITHOUT CONTRAST  TECHNIQUE: Multidetector CT imaging of the lumbar spine was performed without intravenous contrast administration. Multiplanar CT image reconstructions were also generated. RADIATION DOSE REDUCTION: This exam was performed according to the departmental dose-optimization program which includes automated exposure control, adjustment of the mA and/or kV according to patient size and/or use of iterative reconstruction technique. COMPARISON:  Lumbar spine radiographs 05/26/2022. Lumbar spine MRI 07/14/2020. FINDINGS: Segmentation: 5 lumbar type vertebrae. Alignment: No significant listhesis. Vertebrae: Acute fracture of the L3 vertebral body with compression of the superior and inferior endplates resulting in 97% vertebral body height loss. No retropulsion or posterior element fracture. Chronic, previously augmented L5 compression fracture. No suspicious osseous lesion. Paraspinal and other soft tissues: Marked fatty atrophy of the posterior paraspinal musculature bilaterally with history of muscular dystrophy. Asymmetric atrophy of the right psoas muscle. Mild paravertebral soft tissue edema at L3. Minimal abdominal aortic atherosclerosis without aneurysm. Disc levels: T12-L1: Negative. L1-2: Mild disc bulging and mild facet arthrosis without evidence of significant stenosis, similar to the prior MRI. L2-3: Disc bulging and moderate facet hypertrophy result in mild spinal stenosis and mild right greater than left neural foraminal stenosis, similar to the prior MRI. L3-4: Circumferential disc bulging greater to the left and moderate facet hypertrophy result in mild spinal stenosis and mild left greater than right neural foraminal stenosis, similar to the prior MRI. L4-5: Moderate disc space narrowing. Circumferential disc bulging and moderate facet hypertrophy result in mild spinal stenosis and moderate bilateral neural foraminal stenosis, similar to the prior MRI. L5-S1: Disc bulging, a chronic left foraminal disc  protrusion, and mild right and moderate left facet hypertrophy result in mild-to-moderate left neural foraminal stenosis without spinal stenosis, similar to the prior MRI. IMPRESSION: 1. Acute L3 compression fracture with 35% height loss. 2. Chronic L5 compression fracture. 3. Unchanged lumbar disc and facet degeneration with mild spinal stenosis from L2-3 to L4-5 and moderate neural foraminal stenosis at L4-5. 4. Aortic Atherosclerosis (ICD10-I70.0). Electronically Signed   By: Logan Bores M.D.   On: 05/27/2022 12:00    Labs:  CBC: Recent Labs    05/27/22 1025 05/27/22 1712 05/28/22 0342 05/30/22 0332  WBC 5.9 5.7 6.0 4.5  HGB 14.9 15.4 14.0 13.8  HCT 45.2 48.4 42.5 41.6  PLT 150 150 130* 172    COAGS: Recent Labs  05/30/22 1226  INR 1.0    BMP: Recent Labs    05/27/22 1025 05/27/22 1712 05/28/22 0342 05/30/22 0332  NA 137  --  138 139  K 4.0  --  4.2 4.3  CL 104  --  103 106  CO2 24  --  30 26  GLUCOSE 87  --  97 115*  BUN 8  --  11 9  CALCIUM 9.0  --  8.7* 9.2  CREATININE <0.30* <0.30* 0.31* 0.40*  GFRNONAA NOT CALCULATED NOT CALCULATED >60 >60    LIVER FUNCTION TESTS: Recent Labs    05/27/22 1025 05/28/22 0342  BILITOT 0.9 0.8  AST 19 15  ALT 10 <5  ALKPHOS 91 81  PROT 6.6 5.6*  ALBUMIN 3.6 3.0*    TUMOR MARKERS: No results for input(s): "AFPTM", "CEA", "CA199", "CHROMGRNA" in the last 8760 hours.  Assessment and Plan: L3 compression fracture Patient with history of osteoporosis, muscular dystrophy with acute onset back pain at home after fall.  Found to have L3 compression fracture with severe pain not improved after conservative management at home.  NS consulted and recommends proceeding with kyphoplasty for improved pain control and mobility.  Case reviewed by Dr. Karenann Cai who is familiar with patient from prior kyphoplasty at L5 01/10/2020. She approves patient for procedure. PA met with patient and family at bedside this afternoon.   He is agreeable to proceed. This will require round-trip to [facility] for the procedure with return to room at [facility].  RN to arrange [facility] trasnportation for patient to arrive at [facility] at [facility].  NPO p MN.  INR 1.0 05/30/22.   Risks and benefits of L3 compression fracture were discussed with the patient including, but not limited to education regarding the natural healing process of compression fractures without intervention, bleeding, infection, cement migration which may cause spinal cord damage, paralysis, pulmonary embolism or even death.  This interventional procedure involves the use of X-rays and because of the nature of the planned procedure, it is possible that we will have prolonged use of X-ray fluoroscopy.  Potential radiation risks to you include (but are not limited to) the following: - A slightly elevated risk for cancer  several years later in life. This risk is typically less than 0.5% percent. This risk is low in comparison to the normal incidence of human cancer, which is 33% for women and 50% for men according to the [employer]. - Radiation induced injury can include skin redness, resembling a rash, tissue breakdown / ulcers and hair loss (which can be temporary or permanent).   The likelihood of either of these occurring depends on the difficulty of the procedure and whether you are sensitive to radiation due to previous procedures, disease, or genetic conditions.   IF your procedure requires a prolonged use of radiation, you will be notified and given written instructions for further action.  It is your responsibility to monitor the irradiated area for the 2 weeks following the procedure and to notify your physician if you are concerned that you have suffered a radiation induced injury.    All of the patient's questions were answered, patient is agreeable to proceed.  Consent signed and in chart.   Thank you for this interesting consult.  I greatly enjoyed  meeting Jamon E Denunzio Luis [name]. and look forward to participating in their care.  A copy of this report was sent to the requesting provider on this date.  Electronically Signed: Docia Barrier,  PA 05/30/2022, 2:55 PM   I spent a total of 40 Minutes    in face to face in clinical consultation, greater than 50% of which was counseling/coordinating care for L3 compression fracture.

## 2022-05-30 NOTE — Evaluation (Addendum)
Occupational Therapy Evaluation Patient Details Name: Luis [name] Luis [name]. MRN: [id number] DOB: 1956-01-31 Today's Date: 05/30/2022   History of Present Illness 66 y.o. male with medical history significant of Parkinson's disease on Sinement, Chronic severe neuropathic pain, Muscular dystrophy followed by dr [name] at [facility] clinic, chronic debility uses walker who presents with fall from standing after which he has had increase pain in his lumbar region. Dx of L3 compression fx, R middle finger fx.   Clinical Impression   Mr. Luis [name] is a 66 year old man who presents with above medical history supine in bed and with complaints of 10/10 pain. He is agreeable to evaluation and mobilizing. Prior to fall patient able to ambulate in home with walker, assist with ADLs, stands in the shower, and get out of bed himself. The patient is reliant on DME at home including a lift chair and toilet lift. He exhibits baseline upper extremity ROM and strength deficits with poor shoulder and elbow strength. He is max assist to transfer to edge of bed, and min assist to stand from extremely high bed height with walker. He was able to take a couple of steps forward and back with RW and lean against bed in standing for > than 5 minutes. He inevitably needed to transfer to [facility] needing max x 2 for physical assist for descent and powering up. His wife reports he needs significant assist for any sitting/standing not from elevated surface. Patient needs increased assistance for ADLs and functional mobility compared to baseline. Patient will benefit from skilled OT services while in hospital to improve deficits and learn compensatory strategies as needed in order to return to PLOF. Patient highly motivated to return to baseline. Recommend aggressive short term rehab at discharge.      Recommendations for follow up therapy are one component of a multi-disciplinary discharge planning process, led by the attending physician.   Recommendations may be updated based on patient status, additional functional criteria and insurance authorization.   Follow Up Recommendations  Acute inpatient rehab (3hours/day)    Assistance Recommended at Discharge Frequent or constant Supervision/Assistance  Patient can return home with the following A lot of help with bathing/dressing/bathroom;Two people to help with walking and/or transfers;Assistance with cooking/housework;Help with stairs or ramp for entrance    Functional Status Assessment  Patient has had a recent decline in their functional status and demonstrates the ability to make significant improvements in function in a reasonable and predictable amount of time.  Equipment Recommendations  None recommended by OT    Recommendations for Other Services       Precautions / Restrictions Precautions Precautions: Back Precaution Comments: reviewed log roll technique Required Braces or Orthoses: Spinal Brace Spinal Brace: Thoracolumbosacral orthotic Restrictions Weight Bearing Restrictions: No Other Position/Activity Restrictions: pt/wife refused TLSO      Mobility Bed Mobility                    Transfers                          Balance Overall balance assessment: History of Falls, Needs assistance Sitting-balance support: Feet supported Sitting balance-Leahy Scale: Fair     Standing balance support: Bilateral upper extremity supported, Reliant on assistive device for balance, During functional activity   Standing balance comment: Reliant on walker or leaning on bed  ADL either performed or assessed with clinical judgement   ADL Overall ADL's : Needs assistance/impaired Eating/Feeding: Set up;Bed level   Grooming: Wash/dry hands;Wash/dry face;Oral care;Applying deodorant;Brushing hair;Moderate assistance;Bed level   Upper Body Bathing: Maximal assistance;Sitting   Lower Body Bathing: Maximal  assistance;Sitting/lateral leans   Upper Body Dressing : Maximal assistance;Sitting   Lower Body Dressing: Sitting/lateral leans;Total assistance   Toilet Transfer: Maximal assistance;+2 for physical assistance;BSC/3in1 Toilet Transfer Details (indicate cue type and reason): max x 2 to power up from [place] and Hygiene: Total assistance;Sit to/from stand       Functional mobility during ADLs: +2 for physical assistance;+2 for safety/equipment;Maximal assistance General ADL Comments: Needs max assist for bed mobility. Needs significantly elevated bed height to perform sit to stand with walker. Able to take steps with min x 2 in room. +2 for safety at all times.     Vision Patient Visual Report: No change from baseline       Perception     Praxis      Pertinent Vitals/Pain Pain Assessment Pain Assessment: 0-10 Pain Score: 10-Worst pain ever Pain Location: low back at fx site Pain Descriptors / Indicators: Grimacing, Guarding, Restless Pain Intervention(s): Monitored during session, RN gave pain meds during session     Hand Dominance     Extremity/Trunk Assessment Upper Extremity Assessment Upper Extremity Assessment: RUE deficits/detail;LUE deficits/detail RUE Deficits / Details: grip 5/5, wrist 5/5, elbow 3/5, shoulder elevation not functional RUE Coordination: decreased gross motor LUE Deficits / Details: grip 5/5 , elbow 3/5, shoulder elevation not functional LUE Coordination: decreased gross motor   Lower Extremity Assessment Lower Extremity Assessment: Defer to PT evaluation   Cervical / Trunk Assessment Cervical / Trunk Assessment: Normal   Communication Communication Communication: No difficulties   Cognition Arousal/Alertness: Awake/alert Behavior During Therapy: WFL for tasks assessed/performed Overall Cognitive Status: Within Functional Limits for tasks assessed                                       General  Comments       Exercises     Shoulder Instructions      Home Living Family/patient expects to be discharged to:: Private residence Living Arrangements: Spouse/significant other Available Help at Discharge: Family;Available 24 hours/day Type of Home: House       Home Layout: Two level Alternate Level Stairs-Number of Steps: stair lift   Bathroom Shower/Tub: Walk-in shower     Bathroom Accessibility: Yes   Home Equipment: Conservation officer, nature (2 wheels);Toilet riser;Shower seat;Grab bars - tub/shower;Wheelchair - manual   Additional Comments: electric commode lift, no other falls in past 6 months, lift chair      Prior Functioning/Environment Prior Level of Function : Needs assist       Physical Assist : ADLs (physical)     Mobility Comments: walks with RW independently, uses lift chair, lift commode seat, h/o B foot drop (had AFOs but stopped wearing them bc they weren't effective), able to get out of bed on his own per their report ADLs Comments: wife assists- needs assist for LB, he assists with UB, uses toilet lift but able to assist with toileting task, stands in show and has assist for bathing        OT Problem List: Decreased strength;Decreased range of motion;Decreased activity tolerance;Impaired balance (sitting and/or standing);Pain;Impaired UE functional use      OT Treatment/Interventions: Self-care/ADL  training;Therapeutic exercise;Neuromuscular education;DME and/or AE instruction;Therapeutic activities;Balance training;Patient/family education    OT Goals(Current goals can be found in the care plan section) Acute Rehab OT Goals Patient Stated Goal: less pain OT Goal Formulation: With patient/family Time For Goal Achievement: 06/13/22 Potential to Achieve Goals: Good  OT Frequency: Min 2X/week    Co-evaluation              AM-PAC OT "6 Clicks" Daily Activity     Outcome Measure Help from another person eating meals?: A Little Help from another  person taking care of personal grooming?: A Lot Help from another person toileting, which includes using toliet, bedpan, or urinal?: Total Help from another person bathing (including washing, rinsing, drying)?: A Lot Help from another person to put on and taking off regular upper body clothing?: A Lot Help from another person to put on and taking off regular lower body clothing?: Total 6 Click Score: 11   End of Session Equipment Utilized During Treatment: Rolling walker (2 wheels) Nurse Communication: Mobility status  Activity Tolerance: Patient limited by pain Patient left: in bed;with call bell/phone within reach;with family/visitor present  OT Visit Diagnosis: Other abnormalities of gait and mobility (R26.89);Pain                Time: 3335-4562 OT Time Calculation (min): 52 min Charges:  OT General Charges $OT Visit: 1 Visit OT Evaluation $OT Eval Moderate Complexity: 1 Mod OT Treatments $Therapeutic Activity: 23-37 mins  Gustavo Lah, OTR/L Acute Care Rehab Services  Office [phone]   Lenward Chancellor 05/30/2022, 12:45 PM

## 2022-05-30 NOTE — Progress Notes (Addendum)
[facility] PROGRESS NOTE   Luis [name]. FGH:[id number] DOB: Nov 04, 1955 DOA: 05/27/2022  PCP: [name], MD  Brief History/Interval Summary: 66 y.o. male with medical history significant of Parkinson's disease on Sinement, Chronic severe neuropathic pain, Muscular dystrophy followed by Dr [name] at [facility] clinic, chronic debility uses walker who presented after sustaining a fall about 3 days prior to admission.  He had pain in his lower back region.  Imaging studies showed L3 compression fracture.  Patient was hospitalized for further management.    Consultants: Neurosurgery  Procedures: None    Subjective/Interval History: Complains of 8 out of 10 pain in the back.  Wife is at the bedside. Patient did not have any other complaints.  Wife apparently trying to speak to Dr. [name] to see if he would reconsider his recommendation for conservative management and go ahead with kyphoplasty.      Assessment/Plan:  L3 compression fracture Continue with pain medications.  TLSO brace.   Note by Dr. [name] from 9/27 was reviewed.  He recommended conservative management.   Patient wife is waiting on speaking to Dr. [name] regarding this.   Patient being seen by PT and OT.  Inpatient rehabilitation is recommended.  ADDENDUM Called by Dr. [name].  After further reviewing patient's multiple other medical problems it is now felt that patient may benefit from kyphoplasty rather than a conservative approach which could worsen his functional quality of life which is already impaired by his Parkinson's and muscular dystrophy.  We will reconsult interventional radiology.  Parkinson's disease Continue with Sinemet.  Muscular dystrophy Chronic.  Thrombocytopenia, transient,  Resolved.    DVT Prophylaxis: Subcutaneous heparin Code Status: DNR Family Communication: Discussed with patient and his wife Disposition Plan: Inpatient rehabilitation is recommended  Status is:  Inpatient Remains inpatient appropriate because: Intractable back pain    Medications: Scheduled:  carbidopa-levodopa  1 tablet Oral TID   docusate sodium  100 mg Oral QHS   DULoxetine  30 mg Oral BID   heparin  5,000 Units Subcutaneous Q8H   polyethylene glycol  17 g Oral QHS   prednisoLONE acetate  1 drop Both Eyes QODAY   timolol  1 drop Both Eyes BID   Continuous: CVE:LFYBOFBPZ, diazepam, HYDROmorphone (DILAUDID) injection, HYDROmorphone, lactulose, ondansetron **OR** ondansetron (ZOFRAN) IV, mouth rinse  Antibiotics: Anti-infectives (From admission, onward)    None       Objective:  Vital Signs  Vitals:   05/29/22 0543 05/29/22 1425 05/29/22 2132 05/30/22 0612  BP: 110/70 106/81 (!) 126/90 113/81  Pulse: 60 79 65 66  Resp: '17 15 18 18  '$ Temp: 98.7 F (37.1 C) 98.1 F (36.7 C) 98.6 F (37 C) 98.3 F (36.8 C)  TempSrc:  Oral    SpO2: 98% 97% 99% 96%  Weight:      Height:        Intake/Output Summary (Last 24 hours) at 05/30/2022 1048 Last data filed at 05/30/2022 0612 Gross per 24 hour  Intake 360 ml  Output 900 ml  Net -540 ml    Filed Weights   05/27/22 1010  Weight: 68 kg    General appearance: Awake alert.  In no distress Resp: Clear to auscultation bilaterally.  Normal effort Cardio: S1-S2 is normal regular.  No S3-S4.  No rubs murmurs or bruit GI: Abdomen is soft.  Nontender nondistended.  Bowel sounds are present normal.  No masses organomegaly Able to move his lower extremities.   Lab Results:  Data Reviewed: I have  personally reviewed following labs and reports of the imaging studies  CBC: Recent Labs  Lab 05/27/22 1025 05/27/22 1712 05/28/22 0342 05/30/22 0332  WBC 5.9 5.7 6.0 4.5  NEUTROABS 4.6  --   --   --   HGB 14.9 15.4 14.0 13.8  HCT 45.2 48.4 42.5 41.6  MCV 98.7 100.6* 97.7 97.4  PLT 150 150 130* 172     Basic Metabolic Panel: Recent Labs  Lab 05/27/22 1025 05/27/22 1712 05/28/22 0342 05/30/22 0332  NA 137   --  138 139  K 4.0  --  4.2 4.3  CL 104  --  103 106  CO2 24  --  30 26  GLUCOSE 87  --  97 115*  BUN 8  --  11 9  CREATININE <0.30* <0.30* 0.31* 0.40*  CALCIUM 9.0  --  8.7* 9.2     GFR: Estimated Creatinine Clearance: 86.2 mL/min (A) (by C-G formula based on SCr of 0.4 mg/dL (L)).  Liver Function Tests: Recent Labs  Lab 05/27/22 1025 05/28/22 0342  AST 19 15  ALT 10 <5  ALKPHOS 91 81  BILITOT 0.9 0.8  PROT 6.6 5.6*  ALBUMIN 3.6 3.0*      Recent Results (from the past 240 hour(s))  Resp Panel by RT-PCR (Flu A&B, Covid) Anterior Nasal Swab     Status: None   Collection Time: 05/27/22 11:11 AM   Specimen: Anterior Nasal Swab  Result Value Ref Range Status   SARS Coronavirus 2 by RT PCR NEGATIVE NEGATIVE Final    Comment: (NOTE) SARS-CoV-2 target nucleic acids are NOT DETECTED.  The SARS-CoV-2 RNA is generally detectable in upper respiratory specimens during the acute phase of infection. The lowest concentration of SARS-CoV-2 viral copies this assay can detect is 138 copies/mL. A negative result does not preclude SARS-Cov-2 infection and should not be used as the sole basis for treatment or other patient management decisions. A negative result may occur with  improper specimen collection/handling, submission of specimen other than nasopharyngeal swab, presence of viral mutation(s) within the areas targeted by this assay, and inadequate number of viral copies(<138 copies/mL). A negative result must be combined with clinical observations, patient history, and epidemiological information. The expected result is Negative.  Fact Sheet for Patients:  EntrepreneurPulse.com.au  Fact Sheet for Healthcare Providers:  IncredibleEmployment.be  This test is no t yet approved or cleared by the [place] FDA and  has been authorized for detection and/or diagnosis of SARS-CoV-2 by FDA under an Emergency Use Authorization (EUA). This EUA  will remain  in effect (meaning this test can be used) for the duration of the COVID-19 declaration under Section 564(b)(1) of the Act, 21 U.S.C.section 360bbb-3(b)(1), unless the authorization is terminated  or revoked sooner.       Influenza A by PCR NEGATIVE NEGATIVE Final   Influenza B by PCR NEGATIVE NEGATIVE Final    Comment: (NOTE) The Xpert Xpress SARS-CoV-2/FLU/RSV plus assay is intended as an aid in the diagnosis of influenza from Nasopharyngeal swab specimens and should not be used as a sole basis for treatment. Nasal washings and aspirates are unacceptable for Xpert Xpress SARS-CoV-2/FLU/RSV testing.  Fact Sheet for Patients: EntrepreneurPulse.com.au  Fact Sheet for Healthcare Providers: IncredibleEmployment.be  This test is not yet approved or cleared by the [place] FDA and has been authorized for detection and/or diagnosis of SARS-CoV-2 by FDA under an Emergency Use Authorization (EUA). This EUA will remain in effect (meaning this test can be used) for  the duration of the COVID-19 declaration under Section 564(b)(1) of the Act, 21 U.S.C. section 360bbb-3(b)(1), unless the authorization is terminated or revoked.  Performed at [facility], [place] [address], [place], [place] [address]       Radiology Studies: MR LUMBAR SPINE WO CONTRAST  Result Date: 05/29/2022 CLINICAL DATA:  Compression fracture on 05/27/2022 CT lumbar spine EXAM: MRI LUMBAR SPINE WITHOUT CONTRAST TECHNIQUE: Multiplanar, multisequence MR imaging of the lumbar spine was performed. No intravenous contrast was administered. COMPARISON:  MRI lumbar spine 07/22/2020, correlation is also made with CT lumbar spine 05/27/2022 FINDINGS: Segmentation:  5 lumbar-type vertebral bodies. Alignment: Mild levocurvature. Straightening of the normal lumbar lordosis. No listhesis. Vertebrae: Acute compression fracture of L3 with approximately 40% vertebral  body height loss. No significant retropulsion of the posterior cortex. Redemonstrated compression deformity of L5, status post kyphoplasty, which appears unchanged from 07/14/2020. Approximately 4 mm retropulsion of the posterosuperior cortex of L5, unchanged. Increased T2 signal at the inferior aspect of L4 (series 10, image 5-8), without significant vertebral body height loss, possibly edema. Congenitally short pedicles, which narrow the AP diameter of the spinal canal. Conus medullaris and cauda equina: Conus extends to the L1 level. Conus and cauda equina appear normal. Paraspinal and other soft tissues: Atrophy of the paraspinous and psoas muscles Disc levels: T12-L1: No significant disc bulge. No spinal canal stenosis or neural foraminal narrowing. L1-L2: Minimal disc bulge. Small left foraminal protrusion. Mild facet arthropathy. No spinal canal stenosis. No neural foraminal narrowing. L2-L3: Mild disc bulge. Mild facet arthropathy. Narrowing of the lateral recesses, which could affect the descending L3 nerve roots. Mild spinal canal stenosis, unchanged. No neural foraminal narrowing. L3-L4: Mild disc bulge with left foraminal and extreme lateral protrusion. Moderate facet arthropathy. Narrowing of the lateral recesses, which could affect the descending L4 nerve roots. Mild spinal canal stenosis, unchanged. No neural foraminal narrowing. L4-L5: Disc height loss and broad-based disc bulge. Retropulsion of the posterosuperior cortex of L5. Moderate facet arthropathy. Ligamentum flavum hypertrophy. Mild spinal canal stenosis. Narrowing of the lateral recesses, which could affect the descending L5 nerve roots. Mild left-greater-than-right neural foraminal narrowing, unchanged. L5-S1: Mild disc bulge. Mild facet arthropathy. No spinal canal stenosis. Mild-to-moderate left neural foraminal narrowing, unchanged. IMPRESSION: 1. Acute compression fracture of L3 with approximately 40% vertebral body height loss. 2.  Unchanged compression deformity of L5, status post kyphoplasty. 3. Increased T2 signal at the inferior aspect of L4, likely edema, without significant vertebral body height loss to suggest additional compression fracture. 4. Otherwise stable degenerative changes, superimposed on short pedicles, which results in mild spinal canal stenosis at L2-L3, L3-L4, and L4-L5, as well as mild and mild-to-moderate neural foraminal narrowing, as described above. Electronically Signed   By: Merilyn Baba M.D.   On: 05/29/2022 01:35       LOS: 3 days   [facility] Pager on [email or web address]  05/30/2022, 10:48 AM

## 2022-05-30 NOTE — Progress Notes (Signed)
  Transition of Care [facility]) Screening Note   Patient Details  Name: Luis [name]. Date of Birth: 15-Mar-1956   Transition of Care [facility]) CM/SW Contact:    Lennart Pall, LCSW Phone Number: 05/30/2022, 1:39 PM    Transition of Care [facility]) has reviewed patient and no TOC needs have been identified at this time. We will continue to monitor patient advancement through interdisciplinary progression rounds. If new patient transition needs arise, please place a TOC consult.

## 2022-05-30 NOTE — Progress Notes (Signed)
Physical Therapy Treatment Patient Details Name: Luis [name]. MRN: [id number] DOB: 07-01-1956 Today's Date: 05/30/2022   History of Present Illness 66 y.o. male with medical history significant of Parkinson's disease on [place], Chronic severe neuropathic pain, Muscular dystrophy followed by dr [name] at [facility], chronic debility uses walker who presents with fall from standing after which he has had increase pain in his lumbar region. Dx of L3 compression fx, R middle finger fx.    PT Comments    Pt tolerated increased ambulation distance today, he ambulated 12' x 4 with standing (leaning against elevated bed) rest breaks.  Pt/spouse reported feeling pleased with decision to proceed with kyphoplasty. Pt remains a good candidate for inpatient rehab.    Recommendations for follow up therapy are one component of a multi-disciplinary discharge planning process, led by the attending physician.  Recommendations may be updated based on patient status, additional functional criteria and insurance authorization.  Follow Up Recommendations  Acute inpatient rehab (3hours/day)     Assistance Recommended at Discharge Frequent or constant Supervision/Assistance  Patient can return home with the following Two people to help with walking and/or transfers;A lot of help with bathing/dressing/bathroom;Assistance with cooking/housework;Assist for transportation;Help with stairs or ramp for entrance;Direct supervision/assist for medications management   Equipment Recommendations  None recommended by PT    Recommendations for Other Services       Precautions / Restrictions Precautions Precautions: Back Precaution Comments: reviewed log roll technique Required Braces or Orthoses: Spinal Brace Spinal Brace: Thoracolumbosacral orthotic Restrictions Weight Bearing Restrictions: No Other Position/Activity Restrictions: pt/wife refused TLSO     Mobility  Bed Mobility           Sit to  supine: Mod assist, +2 for physical assistance   General bed mobility comments: pt up at edge of elevated bed at start of session; assist for BLEs into bed and to control trunk descent with sit to supine    Transfers Overall transfer level: Needs assistance Equipment used: Rolling walker (2 wheels) Transfers: Sit to/from Stand Sit to Stand: +2 safety/equipment, From elevated surface, Min assist           General transfer comment: pt stood from highly elevated bed x 4 trials with min assist to power up with RW    Ambulation/Gait Ambulation/Gait assistance: +2 physical assistance, +2 safety/equipment, Min assist Gait Distance (Feet): 12 Feet Assistive device: Rolling walker (2 wheels) Gait Pattern/deviations: Step-to pattern, Decreased step length - right, Decreased step length - left, Decreased dorsiflexion - right, Decreased dorsiflexion - left, Trunk flexed, Wide base of support, Ataxic, steppage Gait velocity: decr     General Gait Details: 12' x 4 trials with standing rest breaks (pt leaning against elevated bed) between trials, distance limited by pain/fatigue   Stairs             Wheelchair Mobility    Modified Rankin (Stroke Patients Only)       Balance Overall balance assessment: History of Falls, Needs assistance Sitting-balance support: Feet supported, No upper extremity supported Sitting balance-Leahy Scale: Fair   Postural control: Posterior lean Standing balance support: Bilateral upper extremity supported, Reliant on assistive device for balance, During functional activity Standing balance-Leahy Scale: Poor Standing balance comment: min A initially for standing balance 2* posterior lean, heavy reliance upon RW                            Cognition Arousal/Alertness: Awake/alert Behavior During Therapy:  WFL for tasks assessed/performed Overall Cognitive Status: Within Functional Limits for tasks assessed                                           Exercises General Exercises - Lower Extremity Hip Flexion/Marching: AROM, Both, 10 reps, Standing Mini-Sqauts: AROM, Both, 10 reps, Standing, Limitations Mini Squats Limitations: heavy use of BUEs    General Comments        Pertinent Vitals/Pain Pain Assessment Pain Score: 10-Worst pain ever Pain Location: low back at fx site Pain Descriptors / Indicators: Grimacing Pain Intervention(s): Limited activity within patient's tolerance, Monitored during session, Premedicated before session    Home Living Family/patient expects to be discharged to:: Private residence Living Arrangements: Spouse/significant other Available Help at Discharge: Family;Available 24 hours/day Type of Home: House       Alternate Level Stairs-Number of Steps: stair lift Home Layout: Two level Home Equipment: Rolling Walker (2 wheels);Toilet riser;Shower seat;Grab bars - tub/shower;Wheelchair - manual Additional Comments: electric commode lift, no other falls in past 6 months, lift chair    Prior Function            PT Goals (current goals can now be found in the care plan section) Acute Rehab PT Goals Patient Stated Goal: decrease pain, get some rehab PT Goal Formulation: With patient/family Time For Goal Achievement: 06/11/22 Potential to Achieve Goals: Good Progress towards PT goals: Progressing toward goals    Frequency    Min 3X/week      PT Plan Current plan remains appropriate    Co-evaluation              AM-PAC PT "6 Clicks" Mobility   Outcome Measure  Help needed turning from your back to your side while in a flat bed without using bedrails?: A Little Help needed moving from lying on your back to sitting on the side of a flat bed without using bedrails?: A Lot Help needed moving to and from a bed to a chair (including a wheelchair)?: A Lot Help needed standing up from a chair using your arms (e.g., wheelchair or bedside chair)?: Total Help  needed to walk in hospital room?: A Lot Help needed climbing 3-5 steps with a railing? : Total 6 Click Score: 11    End of Session Equipment Utilized During Treatment: Gait belt Activity Tolerance: Patient limited by fatigue;Patient limited by pain Patient left: in bed;with bed alarm set;with call bell/phone within reach;with family/visitor present Nurse Communication: Mobility status PT Visit Diagnosis: Unsteadiness on feet (R26.81);Other abnormalities of gait and mobility (R26.89);History of falling (Z91.81);Muscle weakness (generalized) (M62.81);Difficulty in walking, not elsewhere classified (R26.2);Pain     Time: 0867-6195 PT Time Calculation (min) (ACUTE ONLY): 24 min  Charges:  $Gait Training: 8-22 mins $Therapeutic Activity: 8-22 mins                     Blondell Reveal Kistler PT 05/30/2022  Acute Rehabilitation Services  Office [phone]

## 2022-05-30 NOTE — Care Management Important Message (Signed)
Important Message  Patient Details IM Letter placed in room. Name: Luis [name]. MRN: [id number] Date of Birth: 1956/05/11   Medicare Important Message Given:  Yes     Kerin Salen 05/30/2022, 2:03 PM

## 2022-05-30 NOTE — Progress Notes (Signed)
I spoke with Dr. Mertha Finders, Dr. Maryland Pink, and the patient's wife regarding this patient.  It seems reasonable to proceed with a tuboplasty via IR since the patient is not improving.

## 2022-05-30 NOTE — Progress Notes (Signed)
Inpatient Rehabilitation Admissions Coordinator   Noted plans for Kyphoplasty. We await procedure before proceeding with a possible CIR admit.  Danne Baxter, RN, MSN Rehab Admissions Coordinator [phone] 05/30/2022 12:18 PM

## 2022-05-31 ENCOUNTER — Ambulatory Visit (HOSPITAL_COMMUNITY): Payer: Medicare Other

## 2022-05-31 DIAGNOSIS — M8008XA Age-related osteoporosis with current pathological fracture, vertebra(e), initial encounter for fracture: Secondary | ICD-10-CM | POA: Insufficient documentation

## 2022-05-31 DIAGNOSIS — G20 Parkinson's disease: Secondary | ICD-10-CM | POA: Diagnosis not present

## 2022-05-31 DIAGNOSIS — S32030D Wedge compression fracture of third lumbar vertebra, subsequent encounter for fracture with routine healing: Secondary | ICD-10-CM | POA: Diagnosis not present

## 2022-05-31 HISTORY — PX: IR KYPHO LUMBAR INC FX REDUCE BONE BX UNI/BIL CANNULATION INC/IMAGING: IMG5519

## 2022-05-31 MED ORDER — BUPIVACAINE HCL 0.25 % IJ SOLN
INTRAMUSCULAR | Status: AC | PRN
  Administered 2022-05-31: 10 mL

## 2022-05-31 MED ORDER — MIDAZOLAM HCL 2 MG/2ML IJ SOLN
INTRAMUSCULAR | Status: AC | PRN
  Administered 2022-05-31: .5 mg via INTRAVENOUS
  Administered 2022-05-31: 1 mg via INTRAVENOUS

## 2022-05-31 MED ORDER — CEFAZOLIN SODIUM-DEXTROSE 2-4 GM/100ML-% IV SOLN
INTRAVENOUS | Status: AC | PRN
  Administered 2022-05-31: 2 g via INTRAVENOUS

## 2022-05-31 MED ORDER — LIDOCAINE HCL 1 % IJ SOLN
INTRAMUSCULAR | Status: AC | PRN
  Administered 2022-05-31: 10 mL

## 2022-05-31 MED ORDER — FENTANYL CITRATE (PF) 100 MCG/2ML IJ SOLN
INTRAMUSCULAR | Status: AC | PRN
  Administered 2022-05-31 (×3): 25 ug via INTRAVENOUS

## 2022-05-31 NOTE — PMR Pre-admission (Signed)
PMR Admission Coordinator Pre-Admission Assessment  Patient: Luis [name]. is an 67 y.o., male MRN: [id number] DOB: [birth year] Height: '5\' 11"'$  (180.3 cm) Weight: 67.4 kg  Insurance Information HMO:     PPO:      PCP:      IPA:      80/20:      OTHER:  PRIMARY: Medicare a and b      Policy#: [id number]      Subscriber: pt Benefits:  Phone #: passport one source      Name: 05/31/22 Eff. Date: 05/03/20     Deduct: $1600      Out of Pocket Max: none      Life Max: none CIR: 100%      SNF: 20 dull days Outpatient: 80%     Co-Pay: 20% Home Health: 100%      Co-Pay: none DME: 80%     Co-Pay: 20% Providers: in network  SECONDARY: Engineer, production supplement      Policy#: [id number]  Financial Counselor:       Phone#:   The "Data Collection Information Summary" for patients in Inpatient Rehabilitation Facilities with attached "[place] Records" was provided and verbally reviewed with: Patient and Family  Emergency Contact Information Contact Information     Name Relation Home Work Mobile   Delcid,Sandra Spouse [phone]  [phone]      Current Medical History  Patient Admitting Diagnosis: L3 compression fracture, debility  History of Present Illness: 66 year old right-handed male with with history of Parkinson's disease maintained on Sinemet, chronic severe neuropathic pain, muscular dystrophy followed by Dr[facility] [name] [facility].   Presented 05/27/2022 after mechanical fall with increased pain in the lumbar region.  He does have a history of prior kyphoplasty at L5 01/10/2020.  MRI images revealed acute compression fracture of L3 with approximately 40% vertebral body height loss.  Unchanged compression deformity of L5.  Underwent L3 balloon kyphoplasty 05/31/2022 per interventional radiology Dr.De Sindy Messing.  He was ordered a thoracolumbosacral orthotic brace but had trouble tolerating the brace.  He remains on Sinemet as prior to admission for history of  Parkinson's disease.  Placed on subcutaneous heparin for DVT prophylaxis.  Maintained on Dilaudid for pain control.     Patient's medical record from [facility] has been reviewed by the rehabilitation admission coordinator and physician.  Past Medical History  Past Medical History:  Diagnosis Date   Bilateral foot-drop 10/19/2018   Bursitis, trochanteric    Episodic   Carpal tunnel syndrome on right    Degenerative disk disease    l5-S1   FSH (facioscapulohumeral muscular dystrophy) ([place]) 10/07/2017   Gait abnormality 10/19/2018   GERD (gastroesophageal reflux disease)    Hemorrhoids    with anal fissures   Left lateral epicondylitis    Parkinson's disease 10/19/2018   Skin cancer    right temple area   Has the patient had major surgery during 100 days prior to admission? Yes  Family History   family history includes Allergies in his brother and sister; Bone cancer in his brother; Brain cancer in his mother; Heart disease in his father.  Current Medications  Current Facility-Administered Medications:    albuterol (PROVENTIL) (2.5 MG/3ML) 0.083% nebulizer solution 2.5 mg, 2.5 mg, Nebulization, Q2H PRN, Myles Rosenthal A, MD   carbidopa-levodopa (SINEMET CR) 50-200 MG per tablet controlled release 1 tablet, 1 tablet, Oral, TID, Clance Boll, MD, 1 tablet at 06/04/22 0908   diazepam (VALIUM) tablet 10  mg, 10 mg, Oral, BID PRN, Myles Rosenthal A, MD, 10 mg at 06/02/22 0003   docusate sodium (COLACE) capsule 100 mg, 100 mg, Oral, QHS, Myles Rosenthal A, MD, 100 mg at 06/03/22 2259   DULoxetine (CYMBALTA) DR capsule 30 mg, 30 mg, Oral, BID, Myles Rosenthal A, MD, 30 mg at 06/04/22 0908   feeding supplement (ENSURE ENLIVE / ENSURE PLUS) liquid 237 mL, 237 mL, Oral, BID BM, Bonnielee Haff, MD, 237 mL at 06/01/22 1547   heparin injection 5,000 Units, 5,000 Units, Subcutaneous, Q8H, Allred, Darrell K, PA-C, 5,000 Units at 06/04/22 0540   HYDROmorphone (DILAUDID)  injection 1 mg, 1 mg, Intravenous, Q3H PRN, Bonnielee Haff, MD, 1 mg at 05/31/22 0656   HYDROmorphone (DILAUDID) tablet 8 mg, 8 mg, Oral, Q6H PRN, Bonnielee Haff, MD, 8 mg at 06/04/22 0908   lactulose (CHRONULAC) 10 GM/15ML solution 10 g, 10 g, Oral, Daily PRN, Myles Rosenthal A, MD   ondansetron (ZOFRAN) tablet 4 mg, 4 mg, Oral, Q6H PRN **OR** ondansetron (ZOFRAN) injection 4 mg, 4 mg, Intravenous, Q6H PRN, Clance Boll, MD   Oral care mouth rinse, 15 mL, Mouth Rinse, PRN, Myles Rosenthal A, MD   polyethylene glycol (MIRALAX / GLYCOLAX) packet 17 g, 17 g, Oral, QHS, Bonnielee Haff, MD, 17 g at 06/03/22 2259   prednisoLONE acetate (PRED FORTE) 1 % ophthalmic suspension 1 drop, 1 drop, Both Eyes, Mariana Arn, MD, 1 drop at 06/02/22 1727  Patients Current Diet:  Diet Order             Diet regular Room service appropriate? Yes; Fluid consistency: Thin  Diet effective now                  Precautions / Restrictions Precautions Precautions: Back Precaution Comments: Kyphoplasty 05/31/22 Spinal Brace: Thoracolumbosacral orthotic (unable to tolerate brace) Restrictions Weight Bearing Restrictions: No Other Position/Activity Restrictions: pt/wife refused TLSO   Has the patient had 2 or more falls or a fall with injury in the past year? Yes  Prior Activity Level Limited Community (1-2x/wk): Mod I with RW; kyphotic; asisted in and out of shower with wife asisst with dressing and drying (Lift chair and electric commode lift, lift onto ramp for in/out of home; weakness right leg; needs to constantly move due to pain)  Prior Functional Level Self Care: Did the patient need help bathing, dressing, using the toilet or eating? Needed some help  Indoor Mobility: Did the patient need assistance with walking from room to room (with or without device)? Independent  Stairs: Did the patient need assistance with internal or external stairs (with or without device)? Needed  some help  Functional Cognition: Did the patient need help planning regular tasks such as shopping or remembering to take medications? Independent  Patient Information Are you of Hispanic, Latino/a,or Spanish origin?: A. No, not of Hispanic, Latino/a, or Spanish origin What is your race?: A. White Do you need or want an interpreter to communicate with a doctor or health care staff?: 0. No  Patient's Response To:  Health Literacy and Transportation Is the patient able to respond to health literacy and transportation needs?: Yes Health Literacy - How often do you need to have someone help you when you read instructions, pamphlets, or other written material from your doctor or pharmacy?: Never In the past 12 months, has lack of transportation kept you from medical appointments or from getting medications?: No In the past 12 months, has lack of transportation kept you from meetings,  work, or from getting things needed for daily living?: No  Development worker, international aid / [place] Devices/Equipment: Environmental consultant (specify type), Eyeglasses, Cane (specify quad or straight) (reading glasses, muscular dystrophy wheel chair that can't use, lift chair, electric commode raiser, medical lift) Home Equipment: Conservation officer, nature (2 wheels), Toilet riser, Shower seat, Grab bars - tub/shower, Wheelchair - manual  Prior Device Use: Indicate devices/aids used by the patient prior to current illness, exacerbation or injury? Walker  Current Functional Level Cognition  Overall Cognitive Status: Within Functional Limits for tasks assessed Orientation Level: Oriented X4 General Comments: very motivated and eager to participate in therapy. Wife present throughout and encouraging and great advocate fo rpt.    Extremity Assessment (includes Sensation/Coordination)  Upper Extremity Assessment: RUE deficits/detail, LUE deficits/detail RUE Deficits / Details: grip 5/5, wrist 5/5, elbow 3/5, shoulder elevation not  functional RUE Coordination: decreased gross motor LUE Deficits / Details: grip 5/5 , elbow 3/5, shoulder elevation not functional LUE Coordination: decreased gross motor  Lower Extremity Assessment: Defer to PT evaluation RLE Deficits / Details: absent ankle DF (baseline), knee ext +3/5, hip flexion -3/5 RLE Coordination: decreased gross motor LLE Deficits / Details: absent ankle DF, knee ext -4/5, hip flexion +3/5 LLE Sensation: WNL LLE Coordination: decreased gross motor    ADLs  Overall ADL's : Needs assistance/impaired Eating/Feeding: Set up, Bed level Grooming: Wash/dry hands, Wash/dry face, Oral care, Applying deodorant, Brushing hair, Moderate assistance, Bed level Upper Body Bathing: Maximal assistance, Sitting Lower Body Bathing: Maximal assistance, Sitting/lateral leans Upper Body Dressing : Maximal assistance, Sitting Lower Body Dressing: Sitting/lateral leans, Total assistance Toilet Transfer: Maximal assistance, +2 for physical assistance, BSC/3in1 Toilet Transfer Details (indicate cue type and reason): max x 2 to power up from [place] and Hygiene: Total assistance, Sit to/from stand Functional mobility during ADLs: +2 for physical assistance, +2 for safety/equipment, Maximal assistance General ADL Comments: Needs max assist for bed mobility. Needs significantly elevated bed height to perform sit to stand with walker. Able to take steps with min x 2 in room. +2 for safety at all times.    Mobility  Overal bed mobility: Needs Assistance Bed Mobility: Supine to Sit Rolling: Min assist Sidelying to sit: +2 for physical assistance, Max assist Supine to sit: Min assist Sit to supine: Mod assist, +2 for physical assistance General bed mobility comments: assist to guide LEs off bed, some use of bed rail, cues for log roll technique. Pt's wife able to demonstrate proper guarding technique for bed mobility.    Transfers  Overall transfer level:  Needs assistance Equipment used: Rolling walker (2 wheels) Transfers: Sit to/from Stand Sit to Stand: Min assist General transfer comment: has to stand from significantly elevated surface due to his MD.  Pt unable to tolerate sitting in regular height recliner/chair. wife reports he cannot sit for longer than ~58mn. and required +2 assist from standard and lower surfaces    Ambulation / Gait / Stairs / Wheelchair Mobility  Ambulation/Gait Ambulation/Gait assistance: Min assist, Mod assist Gait Distance (Feet): 85 Feet Assistive device: Rolling walker (2 wheels) Gait Pattern/deviations: Decreased dorsiflexion - left, Step-through pattern, Decreased stride length, Trunk flexed, Wide base of support, Step-to pattern General Gait Details: pt was able to tolerate slightly increased distance; required  x5 standing rest breaks leaning on wall for support.  foot drop on Left, hip hikes to compensate.  Trunk  is forward flex approx 45degrees and up to 90 degrees when fatigued. Gait velocity: decreased  Posture / Balance Balance Overall balance assessment: History of Falls, Needs assistance Sitting-balance support: Feet supported Sitting balance-Leahy Scale: Fair Postural control: Posterior lean Standing balance support: Bilateral upper extremity supported, Reliant on assistive device for balance, During functional activity Standing balance-Leahy Scale: Poor Standing balance comment: Reliant on walker or leaning on bed/wall    Special needs/care consideration Fall precautions   Previous Home Environment  Living Arrangements: Spouse/significant other  Lives With: Spouse Available Help at Discharge: Family, Available 24 hours/day Type of Home: House Home Layout: Two level Alternate Level Stairs-Number of Steps: stair lift Home Access: Level entry, Ramped entrance (with lift) Bathroom Shower/Tub: Multimedia programmer: Standard (Music therapist) Bathroom Accessibility:  Yes Home Care Services:  (receiving OP therapy) Type of Home Care Services: Other (Comment) [place] (if known): palliative care person come once every 3 to 4 months Additional Comments: electric commode lift, no other falls in past 6 months, lift chair  Discharge Living Setting Plans for Discharge Living Setting: Lives with (comment) (wife) Type of Home at Discharge: House Discharge Home Layout: Two level Alternate Level Stairs-Rails:  (lift chair) Discharge Home Access: Ramped entrance (with lift in entry) Discharge Bathroom Shower/Tub: Walk-in shower Discharge Bathroom Toilet: Standard (electric commode lift) Discharge Bathroom Accessibility: Yes How Accessible: Accessible via walker Does the patient have any problems obtaining your medications?: No  Social/Family/Support Systems Patient Roles: Spouse Contact Information: wife, Katharine Look Anticipated Caregiver: wife Anticipated Ambulance person Information: see contacts Ability/Limitations of Caregiver: no limitations Caregiver Availability: 24/7 Discharge Plan Discussed with Primary Caregiver: Yes Is Caregiver In Agreement with Plan?: Yes Does Caregiver/Family have Issues with Lodging/Transportation while Pt is in Rehab?: No  Goals Patient/Family Goal for Rehab: supervision to min asisst with PT and OT Expected length of stay: ELOS 10 to 14 days Pt/Family Agrees to Admission and willing to participate: Yes Program Orientation Provided & Reviewed with Pt/Caregiver Including Roles  & Responsibilities: Yes  Decrease burden of Care through IP rehab admission: n/a  Possible need for SNF placement upon discharge: not anticipated  Patient Condition: I have reviewed medical records from [facility], spoken with  patient and spouse. I discussed via phone for inpatient rehabilitation assessment.  Patient will benefit from ongoing PT and OT, can actively participate in 3 hours of therapy a day 5 days of the week, and  can make measurable gains during the admission.  Patient will also benefit from the coordinated team approach during an Inpatient Acute Rehabilitation admission.  The patient will receive intensive therapy as well as Rehabilitation physician, nursing, social worker, and care management interventions.  Due to bladder management, bowel management, safety, skin/wound care, disease management, medication administration, pain management, and patient education the patient requires 24 hour a day rehabilitation nursing.  The patient is currently min to mod assist overall with mobility and basic ADLs.  Discharge setting and therapy post discharge at home with outpatient is anticipated.  Patient has agreed to participate in the Acute Inpatient Rehabilitation Program and will admit today.  Preadmission Screen Completed By:  Cleatrice Burke, 06/04/2022 10:24 AM ______________________________________________________________________   Discussed status with Dr. Dagoberto Ligas on 06/04/22 at 1024 and received approval for admission today.  Admission Coordinator:  Cleatrice Burke, RN, time 1024 Date 06/04/22   Assessment/Plan: Diagnosis: Does the need for close, 24 hr/day Medical supervision in concert with the patient's rehab needs make it unreasonable for this patient to be served in a less intensive setting? Yes Co-Morbidities requiring supervision/potential complications: FSHD,  L3 compression fx s/p kyphoplasty, Parkinson's disease, low plts Due to bladder management, bowel management, safety, skin/wound care, disease management, medication administration, pain management, and patient education, does the patient require 24 hr/day rehab nursing? Yes Does the patient require coordinated care of a physician, rehab nurse, PT, OT, and SLP to address physical and functional deficits in the context of the above medical diagnosis(es)? Yes Addressing deficits in the following areas: balance, endurance, locomotion,  strength, transferring, bowel/bladder control, bathing, dressing, feeding, grooming, and toileting Can the patient actively participate in an intensive therapy program of at least 3 hrs of therapy 5 days a week? Yes The potential for patient to make measurable gains while on inpatient rehab is good Anticipated functional outcomes upon discharge from inpatient rehab: supervision and min assist PT, supervision and min assist OT, n/a SLP Estimated rehab length of stay to reach the above functional goals is: 10-14 days Anticipated discharge destination: Home 10. Overall Rehab/Functional Prognosis: good   MD Signature:

## 2022-05-31 NOTE — Progress Notes (Signed)
Inpatient Rehabilitation Admissions Coordinator   I spoke with his wife by phone. Noted plans for Kyphoplasty today. I will follow up Monday with bed availability for possible CIR admit pending bed. She is in agreement.  Danne Baxter, RN, MSN Rehab Admissions Coordinator [phone] 05/31/2022 10:31 AM

## 2022-05-31 NOTE — Procedures (Signed)
INTERVENTIONAL NEURORADIOLOGY BRIEF POSTPROCEDURE NOTE  FLUOROSCOPY GUIDED L3 BALLOON KYPHOPLASTY   Attending: Dr. Pedro Earls  Diagnosis: L3 compression fracture  Access site: Percutaneous  Anesthesia: Moderate sedation  Medication used: 1.5 mg Versed IV; 75 mcg Fentanyl IV.  Complications: None.  Estimated blood loss: None.  Specimen: None.  Findings: L3 compression fracture. Bilateral transpedicular approach used for balloon kyphoplasty.   The patient tolerated the procedure well without incident or complication and is in stable condition.

## 2022-05-31 NOTE — Progress Notes (Signed)
[facility] PROGRESS NOTE   Luis E Sorter Jr. [id number] DOB: Apr 09, 1956 DOA: 05/27/2022  PCP: [name], MD  Brief History/Interval Summary: 66 y.o. male with medical history significant of Parkinson's disease on Sinement, Chronic severe neuropathic pain, Muscular dystrophy followed by Dr [name] at [facility] clinic, chronic debility uses walker who presented after sustaining a fall about 3 days prior to admission.  He had pain in his lower back region.  Imaging studies showed L3 compression fracture.  Patient was hospitalized for further management.    Consultants: Neurosurgery  Procedures: None    Subjective/Interval History: Patient feels well.  Still has pain in the back but had a better night last night.  Denies any chest pain shortness of breath.  Wife is at the bedside.     Assessment/Plan:  L3 compression fracture Initially conservative management was being pursued with pain medications PT and TLSO brace.  However there was concern that his already limited functional quality of life may worsen with conservative approach.  Case was discussed with Dr. [name] again.  Kyphoplasty perhaps offers the quickest way of relief even though this may not work in every case.  This was communicated to the patient and his wife.  They wanted to proceed with kyphoplasty.  IR was reconsulted.  Patient to undergo this procedure this morning. PT and OT has seen the patient.  Inpatient rehabilitation is recommended.    Parkinson's disease Continue with Sinemet.  Muscular dystrophy Chronic.  Thrombocytopenia, transient,  Resolved.    DVT Prophylaxis: Subcutaneous heparin Code Status: DNR Family Communication: Discussed with patient and his wife Disposition Plan: Inpatient rehabilitation is recommended  Status is: Inpatient Remains inpatient appropriate because: Intractable back pain    Medications: Scheduled:  carbidopa-levodopa  1 tablet Oral TID   docusate sodium  100  mg Oral QHS   DULoxetine  30 mg Oral BID   heparin  5,000 Units Subcutaneous Q8H   polyethylene glycol  17 g Oral QHS   prednisoLONE acetate  1 drop Both Eyes QODAY   timolol  1 drop Both Eyes BID   Continuous:  ceFAZolin Stopped (05/31/22 1030)   VHQ:IONGEXBMW, bupivacaine, bupivacaine, ceFAZolin, diazepam, fentaNYL, HYDROmorphone (DILAUDID) injection, HYDROmorphone, lactulose, lidocaine, lidocaine, midazolam, ondansetron **OR** ondansetron (ZOFRAN) IV, mouth rinse  Antibiotics: Anti-infectives (From admission, onward)    Start     Dose/Rate Route Frequency Ordered Stop   05/31/22 1000  ceFAZolin (ANCEF) IVPB 2g/100 mL premix        over 30 Minutes Intravenous Continuous PRN 05/31/22 1030     05/30/22 1515  ceFAZolin (ANCEF) IVPB 2g/100 mL premix        2 g 200 mL/hr over 30 Minutes Intravenous On call to O.R. 05/30/22 1500 05/31/22 0559       Objective:  Vital Signs  Vitals:   05/31/22 1035 05/31/22 1040 05/31/22 1045 05/31/22 1050  BP: 110/77 109/75 109/80 118/82  Pulse: 60 62 60 60  Resp: '14 14 14 14  '$ Temp:      TempSrc:      SpO2: 100% 100% 100% 98%  Weight:      Height:        Intake/Output Summary (Last 24 hours) at 05/31/2022 1100 Last data filed at 05/31/2022 0030 Gross per 24 hour  Intake 120 ml  Output 650 ml  Net -530 ml    Filed Weights   05/27/22 1010 05/31/22 0657  Weight: 68 kg 67.1 kg    General appearance: Awake alert.  In no  distress Resp: Clear to auscultation bilaterally.  Normal effort Cardio: S1-S2 is normal regular.  No S3-S4.  No rubs murmurs or bruit GI: Abdomen is soft.  Nontender nondistended.  Bowel sounds are present normal.  No masses organomegaly   Lab Results:  Data Reviewed: I have personally reviewed following labs and reports of the imaging studies  CBC: Recent Labs  Lab 05/27/22 1025 05/27/22 1712 05/28/22 0342 05/30/22 0332  WBC 5.9 5.7 6.0 4.5  NEUTROABS 4.6  --   --   --   HGB 14.9 15.4 14.0 13.8  HCT 45.2  48.4 42.5 41.6  MCV 98.7 100.6* 97.7 97.4  PLT 150 150 130* 172     Basic Metabolic Panel: Recent Labs  Lab 05/27/22 1025 05/27/22 1712 05/28/22 0342 05/30/22 0332  NA 137  --  138 139  K 4.0  --  4.2 4.3  CL 104  --  103 106  CO2 24  --  30 26  GLUCOSE 87  --  97 115*  BUN 8  --  11 9  CREATININE <0.30* <0.30* 0.31* 0.40*  CALCIUM 9.0  --  8.7* 9.2     GFR: Estimated Creatinine Clearance: 85 mL/min (A) (by C-G formula based on SCr of 0.4 mg/dL (L)).  Liver Function Tests: Recent Labs  Lab 05/27/22 1025 05/28/22 0342  AST 19 15  ALT 10 <5  ALKPHOS 91 81  BILITOT 0.9 0.8  PROT 6.6 5.6*  ALBUMIN 3.6 3.0*      Recent Results (from the past 240 hour(s))  Resp Panel by RT-PCR (Flu A&B, Covid) Anterior Nasal Swab     Status: None   Collection Time: 05/27/22 11:11 AM   Specimen: Anterior Nasal Swab  Result Value Ref Range Status   SARS Coronavirus 2 by RT PCR NEGATIVE NEGATIVE Final    Comment: (NOTE) SARS-CoV-2 target nucleic acids are NOT DETECTED.  The SARS-CoV-2 RNA is generally detectable in upper respiratory specimens during the acute phase of infection. The lowest concentration of SARS-CoV-2 viral copies this assay can detect is 138 copies/mL. A negative result does not preclude SARS-Cov-2 infection and should not be used as the sole basis for treatment or other patient management decisions. A negative result may occur with  improper specimen collection/handling, submission of specimen other than nasopharyngeal swab, presence of viral mutation(s) within the areas targeted by this assay, and inadequate number of viral copies(<138 copies/mL). A negative result must be combined with clinical observations, patient history, and epidemiological information. The expected result is Negative.  Fact Sheet for Patients:  EntrepreneurPulse.com.au  Fact Sheet for Healthcare Providers:  IncredibleEmployment.be  This test is no  t yet approved or cleared by the [place] FDA and  has been authorized for detection and/or diagnosis of SARS-CoV-2 by FDA under an Emergency Use Authorization (EUA). This EUA will remain  in effect (meaning this test can be used) for the duration of the COVID-19 declaration under Section 564(b)(1) of the Act, 21 U.S.C.section 360bbb-3(b)(1), unless the authorization is terminated  or revoked sooner.       Influenza A by PCR NEGATIVE NEGATIVE Final   Influenza B by PCR NEGATIVE NEGATIVE Final    Comment: (NOTE) The Xpert Xpress SARS-CoV-2/FLU/RSV plus assay is intended as an aid in the diagnosis of influenza from Nasopharyngeal swab specimens and should not be used as a sole basis for treatment. Nasal washings and aspirates are unacceptable for Xpert Xpress SARS-CoV-2/FLU/RSV testing.  Fact Sheet for Patients: EntrepreneurPulse.com.au  Fact Sheet  for Healthcare Providers: IncredibleEmployment.be  This test is not yet approved or cleared by the [place] and has been authorized for detection and/or diagnosis of SARS-CoV-2 by FDA under an Emergency Use Authorization (EUA). This EUA will remain in effect (meaning this test can be used) for the duration of the COVID-19 declaration under Section 564(b)(1) of the Act, 21 U.S.C. section 360bbb-3(b)(1), unless the authorization is terminated or revoked.  Performed at [facility] [address], [place], [place] [address]       Radiology Studies: No results found.     LOS: 4 days   Deaisha Welborn [employer] on [email or web address]  05/31/2022, 11:00 AM

## 2022-06-01 DIAGNOSIS — S32030D Wedge compression fracture of third lumbar vertebra, subsequent encounter for fracture with routine healing: Secondary | ICD-10-CM | POA: Diagnosis not present

## 2022-06-01 DIAGNOSIS — G20 Parkinson's disease: Secondary | ICD-10-CM | POA: Diagnosis not present

## 2022-06-01 LAB — CBC
HCT: 41.2 % (ref 39.0–52.0)
Hemoglobin: 13.7 g/dL (ref 13.0–17.0)
MCH: 32.3 pg (ref 26.0–34.0)
MCHC: 33.3 g/dL (ref 30.0–36.0)
MCV: 97.2 fL (ref 80.0–100.0)
Platelets: 192 10*3/uL (ref 150–400)
RBC: 4.24 MIL/uL (ref 4.22–5.81)
RDW: 12.7 % (ref 11.5–15.5)
WBC: 4.7 10*3/uL (ref 4.0–10.5)
nRBC: 0 % (ref 0.0–0.2)

## 2022-06-01 LAB — BASIC METABOLIC PANEL
Anion gap: 4 — ABNORMAL LOW (ref 5–15)
BUN: 15 mg/dL (ref 8–23)
CO2: 28 mmol/L (ref 22–32)
Calcium: 8.9 mg/dL (ref 8.9–10.3)
Chloride: 105 mmol/L (ref 98–111)
Creatinine, Ser: 0.35 mg/dL — ABNORMAL LOW (ref 0.61–1.24)
GFR, Estimated: >60 mL/min (ref >60)
Glucose, Bld: 100 mg/dL — ABNORMAL HIGH (ref 70–99)
Potassium: 4.3 mmol/L (ref 3.5–5.1)
Sodium: 137 mmol/L (ref 135–145)

## 2022-06-01 MED ORDER — ENSURE ENLIVE PO LIQD
237.0000 mL | Freq: Two times a day (BID) | ORAL | Status: DC
  Administered 2022-06-01: 237 mL via ORAL

## 2022-06-01 NOTE — Plan of Care (Signed)
  Problem: Activity: Goal: Risk for activity intolerance will decrease Outcome: Progressing   Problem: Pain Managment: Goal: General experience of comfort will improve Outcome: Progressing   Problem: Skin Integrity: Goal: Risk for impaired skin integrity will decrease Outcome: Progressing   

## 2022-06-01 NOTE — TOC Progression Note (Signed)
Transition of Care (TOC) - Progression Note    Patient Details  Name: Luis [name]. MRN: [id number] Date of Birth: 01-19-1956  Transition of Care [facility]) CM/SW Contact  Cecil Cobbs Phone Number: 06/01/2022, 6:12 PM  Clinical Narrative:     Patient's wife interested in [place].  CIR screened and will follow up on Monday regarding bed availability.       Expected Discharge Plan and Services                                                 Social Determinants of Health (SDOH) Interventions    Readmission Risk Interventions     No data to display

## 2022-06-01 NOTE — Progress Notes (Signed)
[facility] PROGRESS NOTE   Jru E Faley Jr. [id number] DOB: [birth year] DOA: 05/27/2022  PCP: [name], MD  Brief History/Interval Summary: 66 y.o. male with medical history significant of Parkinson's disease on Sinement, Chronic severe neuropathic pain, Muscular dystrophy followed by Dr [name] at [facility] clinic, chronic debility uses walker who presented after sustaining a fall about 3 days prior to admission.  He had pain in his lower back region.  Imaging studies showed L3 compression fracture.  Patient was hospitalized for further management.    Consultants: Neurosurgery.  Interventional radiology  Procedures: Kyphoplasty L3 vertebra    Subjective/Interval History: Patient mentions that the pain in his back is significantly better compared to yesterday.  Procedure went well.  No new complaints offered.  Wife is at the bedside.     Assessment/Plan:  L3 compression fracture Initially conservative management was being pursued with pain medications PT and TLSO brace.  However there was concern that his already limited functional quality of life may worsen with conservative approach.  Case was discussed with Dr. [name] again.  Kyphoplasty perhaps offers the quickest way of relief even though this may not work in every case.  This was communicated to the patient and his wife.  They wanted to proceed with kyphoplasty.  IR was reconsulted.  Patient underwent kyphoplasty on 9/29. Pain seems to be improving. Waiting on reevaluation by PT and OT.  When he was seen a few days ago they had recommended inpatient rehabilitation.   Parkinson's disease Continue with Sinemet.  Stable  Muscular dystrophy Chronic.  Thrombocytopenia, transient,  Resolved.    DVT Prophylaxis: Subcutaneous heparin Code Status: DNR Family Communication: Discussed with patient and his wife Disposition Plan: Inpatient rehabilitation was recommended when seen by PT a few days ago.  Await  reevaluation.  Status is: Inpatient Remains inpatient appropriate because: Intractable back pain    Medications: Scheduled:  carbidopa-levodopa  1 tablet Oral TID   docusate sodium  100 mg Oral QHS   DULoxetine  30 mg Oral BID   heparin  5,000 Units Subcutaneous Q8H   polyethylene glycol  17 g Oral QHS   prednisoLONE acetate  1 drop Both Eyes QODAY   timolol  1 drop Both Eyes BID   Continuous:  GYI:RSWNIOEVO, diazepam, HYDROmorphone (DILAUDID) injection, HYDROmorphone, lactulose, ondansetron **OR** ondansetron (ZOFRAN) IV, mouth rinse  Antibiotics: Anti-infectives (From admission, onward)    Start     Dose/Rate Route Frequency Ordered Stop   05/31/22 1000  ceFAZolin (ANCEF) IVPB 2g/100 mL premix        over 30 Minutes Intravenous Continuous PRN 05/31/22 1030 05/31/22 1030   05/30/22 1515  ceFAZolin (ANCEF) IVPB 2g/100 mL premix        2 g 200 mL/hr over 30 Minutes Intravenous On call to O.R. 05/30/22 1500 05/31/22 0559       Objective:  Vital Signs  Vitals:   05/31/22 2314 06/01/22 0120 06/01/22 0650 06/01/22 1021  BP:   114/82 111/73  Pulse: (!) 52 62 66 69  Resp: '10 16 18 17  '$ Temp:   98.3 F (36.8 C) 97.8 F (36.6 C)  TempSrc:   Oral Oral  SpO2: 98% 98% 98% 100%  Weight:      Height:        Intake/Output Summary (Last 24 hours) at 06/01/2022 1100 Last data filed at 06/01/2022 0958 Gross per 24 hour  Intake 780 ml  Output 1125 ml  Net -345 ml    [employer]  05/27/22 1010 05/31/22 0657  Weight: 68 kg 67.1 kg    General appearance: Awake alert.  In no distress Resp: Clear to auscultation bilaterally.  Normal effort Cardio: S1-S2 is normal regular.  No S3-S4.  No rubs murmurs or bruit GI: Abdomen is soft.  Nontender nondistended.  Bowel sounds are present normal.  No masses organomegaly Extremities: No edema.     Lab Results:  Data Reviewed: I have personally reviewed following labs and reports of the imaging studies  CBC: Recent Labs  Lab  05/27/22 1025 05/27/22 1712 05/28/22 0342 05/30/22 0332 06/01/22 0350  WBC 5.9 5.7 6.0 4.5 4.7  NEUTROABS 4.6  --   --   --   --   HGB 14.9 15.4 14.0 13.8 13.7  HCT 45.2 48.4 42.5 41.6 41.2  MCV 98.7 100.6* 97.7 97.4 97.2  PLT 150 150 130* 172 192     Basic Metabolic Panel: Recent Labs  Lab 05/27/22 1025 05/27/22 1712 05/28/22 0342 05/30/22 0332 06/01/22 0350  NA 137  --  138 139 137  K 4.0  --  4.2 4.3 4.3  CL 104  --  103 106 105  CO2 24  --  '30 26 28  '$ GLUCOSE 87  --  97 115* 100*  BUN 8  --  '11 9 15  '$ CREATININE <0.30* <0.30* 0.31* 0.40* 0.35*  CALCIUM 9.0  --  8.7* 9.2 8.9     GFR: Estimated Creatinine Clearance: 85 mL/min (A) (by C-G formula based on SCr of 0.35 mg/dL (L)).  Liver Function Tests: Recent Labs  Lab 05/27/22 1025 05/28/22 0342  AST 19 15  ALT 10 <5  ALKPHOS 91 81  BILITOT 0.9 0.8  PROT 6.6 5.6*  ALBUMIN 3.6 3.0*      Recent Results (from the past 240 hour(s))  Resp Panel by RT-PCR (Flu A&B, Covid) Anterior Nasal Swab     Status: None   Collection Time: 05/27/22 11:11 AM   Specimen: Anterior Nasal Swab  Result Value Ref Range Status   SARS Coronavirus 2 by RT PCR NEGATIVE NEGATIVE Final    Comment: (NOTE) SARS-CoV-2 target nucleic acids are NOT DETECTED.  The SARS-CoV-2 RNA is generally detectable in upper respiratory specimens during the acute phase of infection. The lowest concentration of SARS-CoV-2 viral copies this assay can detect is 138 copies/mL. A negative result does not preclude SARS-Cov-2 infection and should not be used as the sole basis for treatment or other patient management decisions. A negative result may occur with  improper specimen collection/handling, submission of specimen other than nasopharyngeal swab, presence of viral mutation(s) within the areas targeted by this assay, and inadequate number of viral copies(<138 copies/mL). A negative result must be combined with clinical observations, patient history,  and epidemiological information. The expected result is Negative.  Fact Sheet for Patients:  EntrepreneurPulse.com.au  Fact Sheet for Healthcare Providers:  IncredibleEmployment.be  This test is no t yet approved or cleared by the [place] FDA and  has been authorized for detection and/or diagnosis of SARS-CoV-2 by FDA under an Emergency Use Authorization (EUA). This EUA will remain  in effect (meaning this test can be used) for the duration of the COVID-19 declaration under Section 564(b)(1) of the Act, 21 U.S.C.section 360bbb-3(b)(1), unless the authorization is terminated  or revoked sooner.       Influenza A by PCR NEGATIVE NEGATIVE Final   Influenza B by PCR NEGATIVE NEGATIVE Final    Comment: (NOTE) The Xpert Xpress SARS-CoV-2/FLU/RSV plus assay  is intended as an aid in the diagnosis of influenza from Nasopharyngeal swab specimens and should not be used as a sole basis for treatment. Nasal washings and aspirates are unacceptable for Xpert Xpress SARS-CoV-2/FLU/RSV testing.  Fact Sheet for Patients: EntrepreneurPulse.com.au  Fact Sheet for Healthcare Providers: IncredibleEmployment.be  This test is not yet approved or cleared by the [place] FDA and has been authorized for detection and/or diagnosis of SARS-CoV-2 by FDA under an Emergency Use Authorization (EUA). This EUA will remain in effect (meaning this test can be used) for the duration of the COVID-19 declaration under Section 564(b)(1) of the Act, 21 U.S.C. section 360bbb-3(b)(1), unless the authorization is terminated or revoked.  Performed at [facility], [place] [address], [place], [place] [address]       Radiology Studies: No results found.     LOS: 5 days   Jenai Scaletta [employer] on [email or web address]  06/01/2022, 11:00 AM

## 2022-06-01 NOTE — Progress Notes (Signed)
Site check of KP puncture sites from procedure on 9/29. Small amount of dried blood on dressings bilaterally, but no oozing or active bleeding identified. Mild site tenderness, but patient states that this is something he expected. Patient uses a novel pain scale to describe how he feels pain. He stated that before the procedure, he felt pain at a "level of 300, but now after the procedure it is a level of 100." He endorses severe pain, but says this has been helped with the pain medication he has been given. Patient and his wife both wish to express their heartfelt gratitude to all members of the radiology team.

## 2022-06-01 NOTE — Progress Notes (Signed)
Physical Therapy Treatment Patient Details Name: Luis [name] [name]. MRN: [id number] DOB: 1955-09-11 Today's Date: 06/01/2022   History of Present Illness 66 y.o. male with medical history significant of Parkinson's disease on Sinement, Chronic severe neuropathic pain, Muscular dystrophy followed by dr [name] at [facility] clinic, chronic debility uses walker who presents with fall from standing after which he has had increase pain in his lumbar region. Dx of L3 compression fx, R middle finger fx.    PT Comments    POD # 1 Kyphoplasty General bed mobility comments: OOB standing at bedside with back supported on elevated bed.  Spouse in room. General transfer comment: has to stand from elevated surface due to his MD.  Pt unable to sit in regular height recliner. General Gait Details: wearing B sneakers and using walker, pt was able to tolerate an increased distance but did require x 3 standing rest breaks.  Pt present with foot drop in which he "hip hikes" to compensate.  Posture is forward flex approx 45 degrees.  Steps are alternating with wide BOS.  Distance is limited by fatigue/muscle weakness.  Pt would thrive at [place] to address his mobility decline before returning home with Spouse.   Recommendations for follow up therapy are one component of a multi-disciplinary discharge planning process, led by the attending physician.  Recommendations may be updated based on patient status, additional functional criteria and insurance authorization.  Follow Up Recommendations  Acute inpatient rehab (3hours/day)     Assistance Recommended at Discharge Frequent or constant Supervision/Assistance  Patient can return home with the following Two people to help with walking and/or transfers;A lot of help with bathing/dressing/bathroom;Assistance with cooking/housework;Assist for transportation;Help with stairs or ramp for entrance;Direct supervision/assist for medications management   Equipment  Recommendations  None recommended by PT    Recommendations for Other Services       Precautions / Restrictions Precautions Precautions: Back Precaution Comments: Kyphoplasty 05/31/22 Spinal Brace: Thoracolumbosacral orthotic (unable to tolerate wearing) Restrictions Weight Bearing Restrictions: No     Mobility  Bed Mobility               General bed mobility comments: OOB standing at bedside with back supported on elevated bed.  Spouse in room.    Transfers Overall transfer level: Needs assistance Equipment used: Rolling walker (2 wheels) Transfers: Sit to/from Stand Sit to Stand: Mod assist           General transfer comment: has to stand from elevated surface due to his MD.  Pt unable to sit in regular height recliner.    Ambulation/Gait Ambulation/Gait assistance: Min assist, Mod assist Gait Distance (Feet): 55 Feet Assistive device: Rolling walker (2 wheels)   Gait velocity: decreased     General Gait Details: wearing B sneakers and using walker, pt was able to tolerate an increased distance but did require x 3 standing rest breaks.  Pt present with foot drop in which he "hip hikes" to compensate.  Posture is forward flex approx 45 degrees.  Steps are alternating with wide BOS.  Distance is limited by fatigue/muscle weakness.   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        Exercises      General Comments        Pertinent Vitals/Pain Pain Assessment Pain  Assessment: 0-10 Pain Score: 9  Pain Location: low back at fx site.  Pt calls it, "recovery pain" from surgery Pain Descriptors / Indicators: Grimacing, Operative site guarding Pain Intervention(s): Monitored during session, Premedicated before session, Repositioned    Home Living                          Prior Function            PT Goals (current goals can now be found in the care plan section) Progress towards PT goals: Progressing toward goals    Frequency    Min 3X/week      PT Plan Current plan remains appropriate    Co-evaluation              AM-PAC PT "6 Clicks" Mobility   Outcome Measure  Help needed turning from your back to your side while in a flat bed without using bedrails?: A Little Help needed moving from lying on your back to sitting on the side of a flat bed without using bedrails?: A Little Help needed moving to and from a bed to a chair (including a wheelchair)?: A Lot Help needed standing up from a chair using your arms (e.g., wheelchair or bedside chair)?: A Lot Help needed to walk in hospital room?: A Lot Help needed climbing 3-5 steps with a railing? : Total 6 Click Score: 13    End of Session Equipment Utilized During Treatment: Gait belt Activity Tolerance: Patient limited by fatigue;Patient limited by pain Patient left: Other (comment) (standing at bedside/elevated with hips resting against side of bed.  Spouse in room.) Nurse Communication: Mobility status PT Visit Diagnosis: Unsteadiness on feet (R26.81);Other abnormalities of gait and mobility (R26.89);History of falling (Z91.81);Muscle weakness (generalized) (M62.81);Difficulty in walking, not elsewhere classified (R26.2);Pain     Time: 6808-8110 PT Time Calculation (min) (ACUTE ONLY): 25 min  Charges:  $Gait Training: 8-22 mins $Therapeutic Activity: 8-22 mins                     {Augustina Braddock  PTA Acute  [employer] M-F           [phone] Weekend pager [phone]

## 2022-06-02 DIAGNOSIS — G20A1 Parkinson's disease without dyskinesia, without mention of fluctuations: Secondary | ICD-10-CM | POA: Diagnosis not present

## 2022-06-02 DIAGNOSIS — S32030D Wedge compression fracture of third lumbar vertebra, subsequent encounter for fracture with routine healing: Secondary | ICD-10-CM | POA: Diagnosis not present

## 2022-06-02 NOTE — Plan of Care (Signed)
  Problem: Activity: Goal: Risk for activity intolerance will decrease Outcome: Progressing   Problem: Nutrition: Goal: Adequate nutrition will be maintained Outcome: Progressing   Problem: Safety: Goal: Ability to remain free from injury will improve Outcome: Progressing   Problem: Skin Integrity: Goal: Risk for impaired skin integrity will decrease Outcome: Progressing   Problem: Pain Managment: Goal: General experience of comfort will improve Outcome: Progressing

## 2022-06-02 NOTE — Progress Notes (Signed)
Physical Therapy Treatment Patient Details Name: Luis [name] Keiper [name]. MRN: [id number] DOB: Jul 20, 1956 Today's Date: 06/02/2022   History of Present Illness 66 y.o. male with medical history significant of Parkinson's disease on Sinement, Chronic severe neuropathic pain, Muscular dystrophy followed by dr [name] at [facility] clinic, chronic debility uses walker who presents with fall from standing after which he has had increase pain in his lumbar region. Dx of L3 compression fx, R middle finger fx.    PT Comments    Hubert is progressing toward PT goals.  Demonstrates incr tolerance to activity/gait distance, remains motivated to work with PT. Continue recommend AIR to progress pt to his PLOF. Wife supportive and present for session  Recommendations for follow up therapy are one component of a multi-disciplinary discharge planning process, led by the attending physician.  Recommendations may be updated based on patient status, additional functional criteria and insurance authorization.  Follow Up Recommendations  Acute inpatient rehab (3hours/day)     Assistance Recommended at Discharge Frequent or constant Supervision/Assistance  Patient can return home with the following A little help with walking and/or transfers;A little help with bathing/dressing/bathroom;Assistance with cooking/housework;Assist for transportation;Help with stairs or ramp for entrance   Equipment Recommendations  None recommended by PT    Recommendations for Other Services       Precautions / Restrictions Precautions Precautions: Back Precaution Comments: Kyphoplasty 05/31/22 Required Braces or Orthoses: Spinal Brace Spinal Brace: Thoracolumbosacral orthotic (unable to tolerate brace)     Mobility  Bed Mobility   Bed Mobility: Supine to Sit     Supine to sit: Min assist     General bed mobility comments: assist to guide LEs off bed. left on EOB with friend and wife present. RN aware as bed is elevated     Transfers Overall transfer level: Needs assistance Equipment used: Rolling walker (2 wheels) Transfers: Sit to/from Stand Sit to Stand: Min assist           General transfer comment: has to stand from elevated surface due to his MD.  Pt unable to sit in regular height recliner/chair    Ambulation/Gait Ambulation/Gait assistance: Min assist, Mod assist Gait Distance (Feet): 75 Feet Assistive device: Rolling walker (2 wheels) Gait Pattern/deviations: Decreased dorsiflexion - left, Step-through pattern, Decreased stride length, Trunk flexed, Wide base of support, Step-to pattern Gait velocity: decreased     General Gait Details: pt was able to tolerate an increased distance; required  x 3 standing rest breaks leaning on wall for support.  foot drop on Left, hip hikes to compensate.  Trunk  is forward flex approx 45 to 90 degrees depending on fatigue level; overall  Distance is limited by fatigue/muscle weakness.   Stairs             Wheelchair Mobility    Modified Rankin (Stroke Patients Only)       Balance   Sitting-balance support: Feet supported Sitting balance-Leahy Scale: Fair     Standing balance support: Bilateral upper extremity supported, Reliant on assistive device for balance, During functional activity Standing balance-Leahy Scale: Poor Standing balance comment: Reliant on walker or leaning on bed/wall                            Cognition Arousal/Alertness: Awake/alert Behavior During Therapy: WFL for tasks assessed/performed Overall Cognitive Status: Within Functional Limits for tasks assessed  General Comments: AXO x 3 very motivated 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        Exercises General Exercises - Lower Extremity Mini-Sqauts: Strengthening, Standing, 20 reps, Other (comment) (wall squats--repeated x3 during "rest"  breaks) Mini Squats Limitations: requires wall support    General Comments        Pertinent Vitals/Pain Pain Assessment Pain Assessment: 0-10 Pain Score: 4  Pain Location: low back at fx site.  Pt calls it, "recovery pain" from surgery Pain Descriptors / Indicators: Grimacing, Operative site guarding Pain Intervention(s): Limited activity within patient's tolerance, Monitored during session, Premedicated before session    Home Living                          Prior Function            PT Goals (current goals can now be found in the care plan section) Acute Rehab PT Goals Patient Stated Goal: decrease pain, get some rehab PT Goal Formulation: With patient/family Time For Goal Achievement: 06/11/22 Potential to Achieve Goals: Good Progress towards PT goals: Progressing toward goals    Frequency    Min 3X/week      PT Plan Current plan remains appropriate    Co-evaluation              AM-PAC PT "6 Clicks" Mobility   Outcome Measure  Help needed turning from your back to your side while in a flat bed without using bedrails?: A Little Help needed moving from lying on your back to sitting on the side of a flat bed without using bedrails?: A Little Help needed moving to and from a bed to a chair (including a wheelchair)?: A Lot Help needed standing up from a chair using your arms (e.g., wheelchair or bedside chair)?: A Lot Help needed to walk in hospital room?: A Lot Help needed climbing 3-5 steps with a railing? : Total 6 Click Score: 13    End of Session Equipment Utilized During Treatment: Gait belt Activity Tolerance: Patient tolerated treatment well;Patient limited by fatigue Patient left: with call bell/phone within reach;in bed;with family/visitor present (EOB) Nurse Communication: Mobility status PT Visit Diagnosis: Unsteadiness on feet (R26.81);Other abnormalities of gait and mobility (R26.89);History of falling (Z91.81);Muscle weakness  (generalized) (M62.81);Difficulty in walking, not elsewhere classified (R26.2)     Time: 1413-1500 PT Time Calculation (min) (ACUTE ONLY): 47 min  Charges:  $Gait Training: 23-37 mins $Therapeutic Exercise: 8-22 mins                     Baxter Flattery, PT  Acute Rehab Dept [facility]) [phone]  [facility] Weekend Pager [facility] only)  [phone]  06/02/2022    [facility] 06/02/2022, 3:28 PM

## 2022-06-02 NOTE — Plan of Care (Signed)
  Problem: Health Behavior/Discharge Planning: Goal: Ability to manage health-related needs will improve Outcome: Progressing   Problem: Clinical Measurements: Goal: Ability to maintain clinical measurements within normal limits will improve Outcome: Progressing   Problem: Pain Managment: Goal: General experience of comfort will improve Outcome: Progressing   Problem: Safety: Goal: Ability to remain free from injury will improve Outcome: Progressing

## 2022-06-02 NOTE — Progress Notes (Signed)
Pt stable at this time. Nursing staff has assisted with mobility (pt has his own mobility routine at home-which is at least 20 minutes per session) on this pt in room per family request twice this shift. Nursing staff is working with pt and family as much as possible to accommodate this frequent request for mobility as it does help with pt pain and overall function. Rn did encourage family to not to this unattended due to pt weakness and high risk for falls. Rn will continue to monitor.

## 2022-06-02 NOTE — Progress Notes (Signed)
[facility] PROGRESS NOTE   Luis E Isaac Jr. WJX:[id number] DOB: 27-Jan-1956 DOA: 05/27/2022  PCP: [name], MD  Brief History/Interval Summary: 66 y.o. male with medical history significant of Parkinson's disease on Sinement, Chronic severe neuropathic pain, Muscular dystrophy followed by Dr [name] at [facility] clinic, chronic debility uses walker who presented after sustaining a fall about 3 days prior to admission.  He had pain in his lower back region.  Imaging studies showed L3 compression fracture.  Patient was hospitalized for further management.    Consultants: Neurosurgery.  Interventional radiology  Procedures: Kyphoplasty L3 vertebra 9/29    Subjective/Interval History: Patient mentions that he is feeling better.  Back pain persists but better than what it was last week.  No new complaints offered.      Assessment/Plan:  L3 compression fracture Initially conservative management was being pursued with pain medications PT and TLSO brace.  However there was concern that his already limited functional quality of life may worsen with conservative approach.  Case was discussed with Dr. [name] again.  Kyphoplasty perhaps offers the quickest way of relief even though this may not work in every case.  This was communicated to the patient and his wife.  They wanted to proceed with kyphoplasty.  IR was reconsulted.  Patient underwent kyphoplasty on 9/29. Seems to be stable with reasonably well controlled pain.  PT and OT following.  Inpatient rehabilitation is recommended.  Parkinson's disease Continue with Sinemet.  Stable  Muscular dystrophy Chronic.  Thrombocytopenia, transient,  Resolved.  Blood work was stable as of 9/30.  DVT Prophylaxis: Subcutaneous heparin Code Status: DNR Family Communication: Discussed with patient Disposition Plan: Inpatient rehabilitation  Status is: Inpatient Remains inpatient appropriate because: Intractable back  pain    Medications: Scheduled:  carbidopa-levodopa  1 tablet Oral TID   docusate sodium  100 mg Oral QHS   DULoxetine  30 mg Oral BID   feeding supplement  237 mL Oral BID BM   heparin  5,000 Units Subcutaneous Q8H   polyethylene glycol  17 g Oral QHS   prednisoLONE acetate  1 drop Both Eyes QODAY   timolol  1 drop Both Eyes BID   Continuous:  OZH:[id number], diazepam, HYDROmorphone (DILAUDID) injection, HYDROmorphone, lactulose, ondansetron **OR** ondansetron (ZOFRAN) IV, mouth rinse  Antibiotics: Anti-infectives (From admission, onward)    Start     Dose/Rate Route Frequency Ordered Stop   05/31/22 1000  ceFAZolin (ANCEF) IVPB 2g/100 mL premix        over 30 Minutes Intravenous Continuous PRN 05/31/22 1030 05/31/22 1030   05/30/22 1515  ceFAZolin (ANCEF) IVPB 2g/100 mL premix        2 g 200 mL/hr over 30 Minutes Intravenous On call to O.R. 05/30/22 1500 05/31/22 0559       Objective:  Vital Signs  Vitals:   06/01/22 1021 06/01/22 2058 06/02/22 0500 06/02/22 0540  BP: 111/73 103/62  109/73  Pulse: 69 65  64  Resp: '17 15  16  '$ Temp: 97.8 F (36.6 C) 98.2 F (36.8 C)  98.2 F (36.8 C)  TempSrc: Oral Oral    SpO2: 100% 100%  98%  Weight:   68.6 kg   Height:        Intake/Output Summary (Last 24 hours) at 06/02/2022 0958 Last data filed at 06/02/2022 0738 Gross per 24 hour  Intake 890 ml  Output 1500 ml  Net -610 ml    Filed Weights   05/27/22 1010 05/31/22 0657 06/02/22 0500  Weight: 68 kg 67.1 kg 68.6 kg    General appearance: Awake alert.  In no distress Resp: Clear to auscultation bilaterally.  Normal effort Cardio: S1-S2 is normal regular.  No S3-S4.  No rubs murmurs or bruit GI: Abdomen is soft.  Nontender nondistended.  Bowel sounds are present normal.  No masses organomegaly Extremities: No edema.  Able to move both of his lower extremities   Lab Results:  Data Reviewed: I have personally reviewed following labs and reports of the imaging  studies  CBC: Recent Labs  Lab 05/27/22 1025 05/27/22 1712 05/28/22 0342 05/30/22 0332 06/01/22 0350  WBC 5.9 5.7 6.0 4.5 4.7  NEUTROABS 4.6  --   --   --   --   HGB 14.9 15.4 14.0 13.8 13.7  HCT 45.2 48.4 42.5 41.6 41.2  MCV 98.7 100.6* 97.7 97.4 97.2  PLT 150 150 130* 172 192     Basic Metabolic Panel: Recent Labs  Lab 05/27/22 1025 05/27/22 1712 05/28/22 0342 05/30/22 0332 06/01/22 0350  NA 137  --  138 139 137  K 4.0  --  4.2 4.3 4.3  CL 104  --  103 106 105  CO2 24  --  '30 26 28  '$ GLUCOSE 87  --  97 115* 100*  BUN 8  --  '11 9 15  '$ CREATININE <0.30* <0.30* 0.31* 0.40* 0.35*  CALCIUM 9.0  --  8.7* 9.2 8.9     GFR: Estimated Creatinine Clearance: 86.9 mL/min (A) (by C-G formula based on SCr of 0.35 mg/dL (L)).  Liver Function Tests: Recent Labs  Lab 05/27/22 1025 05/28/22 0342  AST 19 15  ALT 10 <5  ALKPHOS 91 81  BILITOT 0.9 0.8  PROT 6.6 5.6*  ALBUMIN 3.6 3.0*      Recent Results (from the past 240 hour(s))  Resp Panel by RT-PCR (Flu A&B, Covid) Anterior Nasal Swab     Status: None   Collection Time: 05/27/22 11:11 AM   Specimen: Anterior Nasal Swab  Result Value Ref Range Status   SARS Coronavirus 2 by RT PCR NEGATIVE NEGATIVE Final    Comment: (NOTE) SARS-CoV-2 target nucleic acids are NOT DETECTED.  The SARS-CoV-2 RNA is generally detectable in upper respiratory specimens during the acute phase of infection. The lowest concentration of SARS-CoV-2 viral copies this assay can detect is 138 copies/mL. A negative result does not preclude SARS-Cov-2 infection and should not be used as the sole basis for treatment or other patient management decisions. A negative result may occur with  improper specimen collection/handling, submission of specimen other than nasopharyngeal swab, presence of viral mutation(s) within the areas targeted by this assay, and inadequate number of viral copies(<138 copies/mL). A negative result must be combined  with clinical observations, patient history, and epidemiological information. The expected result is Negative.  Fact Sheet for Patients:  EntrepreneurPulse.com.au  Fact Sheet for Healthcare Providers:  IncredibleEmployment.be  This test is no t yet approved or cleared by the [place] FDA and  has been authorized for detection and/or diagnosis of SARS-CoV-2 by FDA under an Emergency Use Authorization (EUA). This EUA will remain  in effect (meaning this test can be used) for the duration of the COVID-19 declaration under Section 564(b)(1) of the Act, 21 U.S.C.section 360bbb-3(b)(1), unless the authorization is terminated  or revoked sooner.       Influenza A by PCR NEGATIVE NEGATIVE Final   Influenza B by PCR NEGATIVE NEGATIVE Final    Comment: (NOTE) The Xpert  Xpress SARS-CoV-2/FLU/RSV plus assay is intended as an aid in the diagnosis of influenza from Nasopharyngeal swab specimens and should not be used as a sole basis for treatment. Nasal washings and aspirates are unacceptable for Xpert Xpress SARS-CoV-2/FLU/RSV testing.  Fact Sheet for Patients: EntrepreneurPulse.com.au  Fact Sheet for Healthcare Providers: IncredibleEmployment.be  This test is not yet approved or cleared by the [place] FDA and has been authorized for detection and/or diagnosis of SARS-CoV-2 by FDA under an Emergency Use Authorization (EUA). This EUA will remain in effect (meaning this test can be used) for the duration of the COVID-19 declaration under Section 564(b)(1) of the Act, 21 U.S.C. section 360bbb-3(b)(1), unless the authorization is terminated or revoked.  Performed at [facility], [place] [address], [place], [place] [address]       Radiology Studies: No results found.     LOS: 6 days   Luis [name] [employer] on [email or web address]  06/02/2022, 9:58 AM

## 2022-06-03 DIAGNOSIS — S32030D Wedge compression fracture of third lumbar vertebra, subsequent encounter for fracture with routine healing: Secondary | ICD-10-CM | POA: Diagnosis not present

## 2022-06-03 NOTE — Care Management Important Message (Signed)
Important Message  Patient Details IM Letter given to the Patient. Name: Luis [name]. MRN: [id number] Date of Birth: 01-02-1956   Medicare Important Message Given:  Yes     Kerin Salen 06/03/2022, 1:44 PM

## 2022-06-03 NOTE — Plan of Care (Signed)
  Problem: Pain Managment: Goal: General experience of comfort will improve Outcome: Progressing   Problem: Safety: Goal: Ability to remain free from injury will improve Outcome: Progressing   

## 2022-06-03 NOTE — Progress Notes (Signed)
[facility] PROGRESS NOTE   Luis E Lamora Jr. IFO:[id number] DOB: 06-02-1956 DOA: 05/27/2022  PCP: [name], MD  Brief History/Interval Summary: 66 y.o. male with medical history significant of Parkinson's disease on Sinement, Chronic severe neuropathic pain, Muscular dystrophy followed by Dr [name] at [facility] clinic, chronic debility uses walker who presented after sustaining a fall about 3 days prior to admission.  He had pain in his lower back region.  Imaging studies showed L3 compression fracture.  Patient was hospitalized for further management.    Consultants: Neurosurgery.  Interventional radiology  Procedures: Kyphoplasty L3 vertebra 9/29    Subjective/Interval History: Patient mentions that back pain is improving.  Still feels fatigued and has generalized weakness.  Wife is at the bedside.      Assessment/Plan:  L3 compression fracture Initially conservative management was being pursued with pain medications PT and TLSO brace.  However there was concern that his already limited functional quality of life may worsen with conservative approach.  Case was discussed with Dr. [name] again.  Kyphoplasty perhaps offers the quickest way of relief even though this may not work in every case.  This was communicated to the patient and his wife.  They wanted to proceed with kyphoplasty.  IR was reconsulted.  Patient underwent kyphoplasty on 9/29. Seems to be stable.  Pain seems to be improving.  Continues to have physical deconditioning.  Seen by PT and OT.  Inpatient rehabilitation is recommended.  Parkinson's disease Continue with Sinemet.  Stable  Muscular dystrophy Chronic.  Thrombocytopenia, transient,  Resolved.  Blood work was stable as of 9/30.  DVT Prophylaxis: Subcutaneous heparin Code Status: DNR Family Communication: Discussed with patient Disposition Plan: Inpatient rehabilitation  Status is: Inpatient Remains inpatient appropriate because: Intractable  back pain    Medications: Scheduled:  carbidopa-levodopa  1 tablet Oral TID   docusate sodium  100 mg Oral QHS   DULoxetine  30 mg Oral BID   feeding supplement  237 mL Oral BID BM   heparin  5,000 Units Subcutaneous Q8H   polyethylene glycol  17 g Oral QHS   prednisoLONE acetate  1 drop Both Eyes QODAY   Continuous:  [id number], diazepam, HYDROmorphone (DILAUDID) injection, HYDROmorphone, lactulose, ondansetron **OR** ondansetron (ZOFRAN) IV, mouth rinse  Antibiotics: Anti-infectives (From admission, onward)    Start     Dose/Rate Route Frequency Ordered Stop   05/31/22 1000  ceFAZolin (ANCEF) IVPB 2g/100 mL premix        over 30 Minutes Intravenous Continuous PRN 05/31/22 1030 05/31/22 1030   05/30/22 1515  ceFAZolin (ANCEF) IVPB 2g/100 mL premix        2 g 200 mL/hr over 30 Minutes Intravenous On call to O.R. 05/30/22 1500 05/31/22 0559       Objective:  Vital Signs  Vitals:   06/02/22 1323 06/02/22 2100 06/03/22 0421 06/03/22 0422  BP: 98/69 101/71 113/64   Pulse: 68 63 67   Resp: '14 16 16   '$ Temp: (!) 97.3 F (36.3 C) 97.9 F (36.6 C) 97.7 F (36.5 C)   TempSrc: Oral Oral Oral   SpO2: 100% 100% 99%   Weight:    69 kg  Height:        Intake/Output Summary (Last 24 hours) at 06/03/2022 1036 Last data filed at 06/03/2022 0419 Gross per 24 hour  Intake 960 ml  Output 1700 ml  Net -740 ml    Filed Weights   05/31/22 0657 06/02/22 0500 06/03/22 0422  Weight: 67.1 kg  68.6 kg 69 kg    General appearance: Awake alert.  In no distress Resp: Clear to auscultation bilaterally.  Normal effort Cardio: S1-S2 is normal regular.  No S3-S4.  No rubs murmurs or bruit GI: Abdomen is soft.  Nontender nondistended.  Bowel sounds are present normal.  No masses organomegaly Extremities: No edema.    Lab Results:  Data Reviewed: I have personally reviewed following labs and reports of the imaging studies  CBC: Recent Labs  Lab 05/27/22 1712 05/28/22 0342  05/30/22 0332 06/01/22 0350  WBC 5.7 6.0 4.5 4.7  HGB 15.4 14.0 13.8 13.7  HCT 48.4 42.5 41.6 41.2  MCV 100.6* 97.7 97.4 97.2  PLT 150 130* 172 192     Basic Metabolic Panel: Recent Labs  Lab 05/27/22 1712 05/28/22 0342 05/30/22 0332 06/01/22 0350  NA  --  138 139 137  K  --  4.2 4.3 4.3  CL  --  103 106 105  CO2  --  '30 26 28  '$ GLUCOSE  --  97 115* 100*  BUN  --  '11 9 15  '$ CREATININE <0.30* 0.31* 0.40* 0.35*  CALCIUM  --  8.7* 9.2 8.9     GFR: Estimated Creatinine Clearance: 87.4 mL/min (A) (by C-G formula based on SCr of 0.35 mg/dL (L)).  Liver Function Tests: Recent Labs  Lab 05/28/22 0342  AST 15  ALT <5  ALKPHOS 81  BILITOT 0.8  PROT 5.6*  ALBUMIN 3.0*      Recent Results (from the past 240 hour(s))  Resp Panel by RT-PCR (Flu A&B, Covid) Anterior Nasal Swab     Status: None   Collection Time: 05/27/22 11:11 AM   Specimen: Anterior Nasal Swab  Result Value Ref Range Status   SARS Coronavirus 2 by RT PCR NEGATIVE NEGATIVE Final    Comment: (NOTE) SARS-CoV-2 target nucleic acids are NOT DETECTED.  The SARS-CoV-2 RNA is generally detectable in upper respiratory specimens during the acute phase of infection. The lowest concentration of SARS-CoV-2 viral copies this assay can detect is 138 copies/mL. A negative result does not preclude SARS-Cov-2 infection and should not be used as the sole basis for treatment or other patient management decisions. A negative result may occur with  improper specimen collection/handling, submission of specimen other than nasopharyngeal swab, presence of viral mutation(s) within the areas targeted by this assay, and inadequate number of viral copies(<138 copies/mL). A negative result must be combined with clinical observations, patient history, and epidemiological information. The expected result is Negative.  Fact Sheet for Patients:  EntrepreneurPulse.com.au  Fact Sheet for Healthcare Providers:   IncredibleEmployment.be  This test is no t yet approved or cleared by the [place] FDA and  has been authorized for detection and/or diagnosis of SARS-CoV-2 by FDA under an Emergency Use Authorization (EUA). This EUA will remain  in effect (meaning this test can be used) for the duration of the COVID-19 declaration under Section 564(b)(1) of the Act, 21 U.S.C.section 360bbb-3(b)(1), unless the authorization is terminated  or revoked sooner.       Influenza A by PCR NEGATIVE NEGATIVE Final   Influenza B by PCR NEGATIVE NEGATIVE Final    Comment: (NOTE) The Xpert Xpress SARS-CoV-2/FLU/RSV plus assay is intended as an aid in the diagnosis of influenza from Nasopharyngeal swab specimens and should not be used as a sole basis for treatment. Nasal washings and aspirates are unacceptable for Xpert Xpress SARS-CoV-2/FLU/RSV testing.  Fact Sheet for Patients: EntrepreneurPulse.com.au  Fact Sheet for Healthcare  Providers: IncredibleEmployment.be  This test is not yet approved or cleared by the [place] and has been authorized for detection and/or diagnosis of SARS-CoV-2 by FDA under an Emergency Use Authorization (EUA). This EUA will remain in effect (meaning this test can be used) for the duration of the COVID-19 declaration under Section 564(b)(1) of the Act, 21 U.S.C. section 360bbb-3(b)(1), unless the authorization is terminated or revoked.  Performed at [facility], [place] [address], [place], [place] [address]       Radiology Studies: No results found.     LOS: 7 days   Mardel Grudzien [employer] on [email or web address]  06/03/2022, 10:36 AM

## 2022-06-03 NOTE — Progress Notes (Signed)
Physical Therapy Treatment Patient Details Name: Luis [name] Luis [name]. MRN: [id number] DOB: Jun 22, 1956 Today's Date: 06/03/2022   History of Present Illness 66 y.o. male with medical history significant of Parkinson's disease on [place], Chronic severe neuropathic pain, Muscular dystrophy followed by dr [name] at [facility] clinic, chronic debility uses walker who presents with fall from standing after which he has had increase pain in his lumbar region. Dx of L3 compression fx, R middle finger fx.    PT Comments    Pt is slowly progressing with slightly increased ambulation distance Pt continue to remain limited by decreased activity tolerance and generalized weakness, requiring 5 standing rest breaks (supported against wall) with 51f of ambulation.Continue to recommend AIR to progress pt to his PLOF, wife supportive and present throughout session. Pt will benefit from continued skilled PT to increase their independence and maximize safety with mobility.       Recommendations for follow up therapy are one component of a multi-disciplinary discharge planning process, led by the attending physician.  Recommendations may be updated based on patient status, additional functional criteria and insurance authorization.  Follow Up Recommendations  Acute inpatient rehab (3hours/day)     Assistance Recommended at Discharge Frequent or constant Supervision/Assistance  Patient can return home with the following A little help with walking and/or transfers;A little help with bathing/dressing/bathroom;Assistance with cooking/housework;Assist for transportation;Help with stairs or ramp for entrance   Equipment Recommendations  None recommended by PT    Recommendations for Other Services       Precautions / Restrictions Precautions Precautions: Back Precaution Comments: Kyphoplasty 05/31/22 Required Braces or Orthoses: Spinal Brace Spinal Brace: Thoracolumbosacral orthotic (unable to tolerate  brace) Restrictions Weight Bearing Restrictions: No     Mobility  Bed Mobility Overal bed mobility: Needs Assistance Bed Mobility: Supine to Sit     Supine to sit: Min assist     General bed mobility comments: assist to guide LEs off bed, some use of bed rail, cues for log roll technique. Pt's wife able to demonstrate proper guarding technique for bed mobility.    Transfers Overall transfer level: Needs assistance Equipment used: Rolling walker (2 wheels) Transfers: Sit to/from Stand Sit to Stand: Min assist           General transfer comment: has to stand from significantly elevated surface due to his MD.  Pt unable to tolerate sitting in regular height recliner/chair. wife reports he cannot sit for longer than ~3100m. and required +2 assist from standard and lower surfaces    Ambulation/Gait Ambulation/Gait assistance: Min assist, Mod assist Gait Distance (Feet): 85 Feet Assistive device: Rolling walker (2 wheels) Gait Pattern/deviations: Decreased dorsiflexion - left, Step-through pattern, Decreased stride length, Trunk flexed, Wide base of support, Step-to pattern Gait velocity: decreased     General Gait Details: pt was able to tolerate slightly increased distance; required  x5 standing rest breaks leaning on wall for support.  foot drop on Left, hip hikes to compensate.  Trunk  is forward flex approx 45degrees and up to 90 degrees when fatigued.   Stairs             Wheelchair Mobility    Modified Rankin (Stroke Patients Only)       Balance Overall balance assessment: History of Falls, Needs assistance Sitting-balance support: Feet supported Sitting balance-Leahy Scale: Fair     Standing balance support: Bilateral upper extremity supported, Reliant on assistive device for balance, During functional activity Standing balance-Leahy Scale: Poor Standing balance comment: Reliant on  walker or leaning on bed/wall                             Cognition Arousal/Alertness: Awake/alert Behavior During Therapy: WFL for tasks assessed/performed Overall Cognitive Status: Within Functional Limits for tasks assessed                                 General Comments: very motivated and eager to participate in therapy. Wife present throughout and encouraging and great advocate fo rpt.        Exercises      General Comments        Pertinent Vitals/Pain Pain Assessment Pain Assessment: 0-10 Pain Score: 5  Pain Location: low back at fx site.  Pt calls it, "healing pain" from surgery Pain Descriptors / Indicators: Grimacing, Operative site guarding Pain Intervention(s): Limited activity within patient's tolerance, Monitored during session    Home Living                          Prior Function            PT Goals (current goals can now be found in the care plan section) Acute Rehab PT Goals Patient Stated Goal: decrease pain, get some rehab PT Goal Formulation: With patient/family Time For Goal Achievement: 06/11/22 Potential to Achieve Goals: Good Progress towards PT goals: Progressing toward goals    Frequency    Min 3X/week      PT Plan Current plan remains appropriate    Co-evaluation              AM-PAC PT "6 Clicks" Mobility   Outcome Measure  Help needed turning from your back to your side while in a flat bed without using bedrails?: A Little Help needed moving from lying on your back to sitting on the side of a flat bed without using bedrails?: A Little Help needed moving to and from a bed to a chair (including a wheelchair)?: A Lot Help needed standing up from a chair using your arms (e.g., wheelchair or bedside chair)?: A Lot Help needed to walk in hospital room?: A Lot Help needed climbing 3-5 steps with a railing? : Total 6 Click Score: 13    End of Session Equipment Utilized During Treatment: Gait belt Activity Tolerance: Patient tolerated treatment well;Patient  limited by fatigue Patient left: with call bell/phone within reach;with family/visitor present (sitting on [facility] with spouse supervision- RN aware and to assist with +2 stand and back to bed) Nurse Communication: Mobility status PT Visit Diagnosis: Unsteadiness on feet (R26.81);Other abnormalities of gait and mobility (R26.89);History of falling (Z91.81);Muscle weakness (generalized) (M62.81);Difficulty in walking, not elsewhere classified (R26.2)     Time: 3354-5625 PT Time Calculation (min) (ACUTE ONLY): 32 min  Charges:  $Gait Training: 8-22 mins $Therapeutic Activity: 8-22 mins                     Festus Barren PT, DPT  Acute Rehabilitation Services  Office [phone]  06/03/2022, 1:44 PM

## 2022-06-03 NOTE — Progress Notes (Signed)
Inpatient Rehabilitation Admissions Coordinator   I do not have CIR bed available to admit today. I contacted his wife by phone and she is aware. I discussed the possible need to look at other AIR level rehab centers. She has a preference for [facility]. I have alerted acute team and TOC. I will follow up tomorrow.  Danne Baxter, RN, MSN Rehab Admissions Coordinator [phone] 06/03/2022 11:57 AM\

## 2022-06-04 ENCOUNTER — Encounter (HOSPITAL_COMMUNITY): Payer: Self-pay | Admitting: Physical Medicine & Rehabilitation

## 2022-06-04 ENCOUNTER — Other Ambulatory Visit: Payer: Self-pay

## 2022-06-04 ENCOUNTER — Inpatient Hospital Stay (HOSPITAL_COMMUNITY)
Admission: RE | Admit: 2022-06-04 | Discharge: 2022-06-12 | DRG: 560 | Disposition: A | Discharge status: Home / Self Care | Payer: Medicare Other | Source: Intra-hospital | Attending: Physical Medicine & Rehabilitation | Admitting: Physical Medicine & Rehabilitation

## 2022-06-04 DIAGNOSIS — G7102 Facioscapulohumeral muscular dystrophy: Secondary | ICD-10-CM | POA: Diagnosis present

## 2022-06-04 DIAGNOSIS — M549 Dorsalgia, unspecified: Secondary | ICD-10-CM | POA: Diagnosis present

## 2022-06-04 DIAGNOSIS — F419 Anxiety disorder, unspecified: Secondary | ICD-10-CM | POA: Diagnosis present

## 2022-06-04 DIAGNOSIS — G7100 Muscular dystrophy, unspecified: Secondary | ICD-10-CM | POA: Diagnosis not present

## 2022-06-04 DIAGNOSIS — K5900 Constipation, unspecified: Secondary | ICD-10-CM | POA: Diagnosis present

## 2022-06-04 DIAGNOSIS — Z8249 Family history of ischemic heart disease and other diseases of the circulatory system: Secondary | ICD-10-CM | POA: Diagnosis not present

## 2022-06-04 DIAGNOSIS — Z9889 Other specified postprocedural states: Secondary | ICD-10-CM | POA: Diagnosis not present

## 2022-06-04 DIAGNOSIS — S32030A Wedge compression fracture of third lumbar vertebra, initial encounter for closed fracture: Secondary | ICD-10-CM

## 2022-06-04 DIAGNOSIS — S32030D Wedge compression fracture of third lumbar vertebra, subsequent encounter for fracture with routine healing: Secondary | ICD-10-CM | POA: Diagnosis not present

## 2022-06-04 DIAGNOSIS — K219 Gastro-esophageal reflux disease without esophagitis: Secondary | ICD-10-CM | POA: Diagnosis present

## 2022-06-04 DIAGNOSIS — Z66 Do not resuscitate: Secondary | ICD-10-CM | POA: Diagnosis present

## 2022-06-04 DIAGNOSIS — M25521 Pain in right elbow: Secondary | ICD-10-CM | POA: Diagnosis not present

## 2022-06-04 DIAGNOSIS — Z741 Need for assistance with personal care: Secondary | ICD-10-CM | POA: Diagnosis present

## 2022-06-04 DIAGNOSIS — G20A1 Parkinson's disease without dyskinesia, without mention of fluctuations: Secondary | ICD-10-CM | POA: Diagnosis present

## 2022-06-04 DIAGNOSIS — S32000S Wedge compression fracture of unspecified lumbar vertebra, sequela: Secondary | ICD-10-CM | POA: Diagnosis not present

## 2022-06-04 DIAGNOSIS — M792 Neuralgia and neuritis, unspecified: Secondary | ICD-10-CM | POA: Diagnosis not present

## 2022-06-04 DIAGNOSIS — M7022 Olecranon bursitis, left elbow: Secondary | ICD-10-CM | POA: Diagnosis not present

## 2022-06-04 DIAGNOSIS — M21371 Foot drop, right foot: Secondary | ICD-10-CM | POA: Diagnosis present

## 2022-06-04 DIAGNOSIS — N401 Enlarged prostate with lower urinary tract symptoms: Secondary | ICD-10-CM | POA: Diagnosis present

## 2022-06-04 DIAGNOSIS — Z79891 Long term (current) use of opiate analgesic: Secondary | ICD-10-CM | POA: Diagnosis not present

## 2022-06-04 DIAGNOSIS — S32039D Unspecified fracture of third lumbar vertebra, subsequent encounter for fracture with routine healing: Principal | ICD-10-CM

## 2022-06-04 DIAGNOSIS — Z85828 Personal history of other malignant neoplasm of skin: Secondary | ICD-10-CM

## 2022-06-04 DIAGNOSIS — M6250 Muscle wasting and atrophy, not elsewhere classified, unspecified site: Secondary | ICD-10-CM | POA: Diagnosis present

## 2022-06-04 DIAGNOSIS — Z79899 Other long term (current) drug therapy: Secondary | ICD-10-CM

## 2022-06-04 DIAGNOSIS — Z808 Family history of malignant neoplasm of other organs or systems: Secondary | ICD-10-CM | POA: Diagnosis not present

## 2022-06-04 DIAGNOSIS — G629 Polyneuropathy, unspecified: Secondary | ICD-10-CM | POA: Diagnosis present

## 2022-06-04 DIAGNOSIS — Z9181 History of falling: Secondary | ICD-10-CM | POA: Diagnosis not present

## 2022-06-04 DIAGNOSIS — R770 Abnormality of albumin: Secondary | ICD-10-CM

## 2022-06-04 DIAGNOSIS — W1830XD Fall on same level, unspecified, subsequent encounter: Secondary | ICD-10-CM | POA: Diagnosis not present

## 2022-06-04 DIAGNOSIS — M25522 Pain in left elbow: Secondary | ICD-10-CM | POA: Diagnosis not present

## 2022-06-04 DIAGNOSIS — S32000A Wedge compression fracture of unspecified lumbar vertebra, initial encounter for closed fracture: Secondary | ICD-10-CM | POA: Diagnosis present

## 2022-06-04 DIAGNOSIS — G8929 Other chronic pain: Secondary | ICD-10-CM | POA: Diagnosis present

## 2022-06-04 DIAGNOSIS — Z9049 Acquired absence of other specified parts of digestive tract: Secondary | ICD-10-CM | POA: Diagnosis not present

## 2022-06-04 DIAGNOSIS — I420 Dilated cardiomyopathy: Secondary | ICD-10-CM | POA: Diagnosis present

## 2022-06-04 DIAGNOSIS — R3915 Urgency of urination: Secondary | ICD-10-CM | POA: Diagnosis present

## 2022-06-04 DIAGNOSIS — M21372 Foot drop, left foot: Secondary | ICD-10-CM | POA: Diagnosis present

## 2022-06-04 LAB — CBC
HCT: 43.1 % (ref 39.0–52.0)
Hemoglobin: 14.7 g/dL (ref 13.0–17.0)
MCH: 32.7 pg (ref 26.0–34.0)
MCHC: 34.1 g/dL (ref 30.0–36.0)
MCV: 95.8 fL (ref 80.0–100.0)
Platelets: 282 10*3/uL (ref 150–400)
RBC: 4.5 MIL/uL (ref 4.22–5.81)
RDW: 12.7 % (ref 11.5–15.5)
WBC: 5.6 10*3/uL (ref 4.0–10.5)
nRBC: 0 % (ref 0.0–0.2)

## 2022-06-04 LAB — CREATININE, SERUM
Creatinine, Ser: 0.44 mg/dL — ABNORMAL LOW (ref 0.61–1.24)
GFR, Estimated: >60 mL/min (ref >60)

## 2022-06-04 MED ORDER — LACTULOSE 10 GM/15ML PO SOLN: 10.0000 g | Freq: Every day | ORAL | Status: DC | PRN

## 2022-06-04 MED ORDER — DIAZEPAM 5 MG PO TABS: 10.0000 mg | ORAL_TABLET | Freq: Two times a day (BID) | ORAL | Status: DC | PRN

## 2022-06-04 MED ORDER — ENSURE ENLIVE PO LIQD
237.0000 mL | Freq: Two times a day (BID) | ORAL | Status: DC
  Administered 2022-06-04 – 2022-06-07 (×5): 237 mL via ORAL

## 2022-06-04 MED ORDER — CARBIDOPA-LEVODOPA ER 50-200 MG PO TBCR
1.0000 | EXTENDED_RELEASE_TABLET | Freq: Three times a day (TID) | ORAL | Status: DC
  Administered 2022-06-04 – 2022-06-12 (×24): 1 via ORAL
  Filled 2022-06-04 (×26): qty 1

## 2022-06-04 MED ORDER — HYDROMORPHONE HCL 2 MG PO TABS: 8.0000 mg | ORAL_TABLET | Freq: Four times a day (QID) | ORAL | Status: DC | PRN

## 2022-06-04 MED ORDER — PREDNISOLONE ACETATE 1 % OP SUSP
1.0000 [drp] | OPHTHALMIC | Status: DC
  Administered 2022-06-05 – 2022-06-11 (×4): 1 [drp] via OPHTHALMIC
  Filled 2022-06-04: qty 5

## 2022-06-04 MED ORDER — ONDANSETRON HCL 4 MG PO TABS: 4.0000 mg | ORAL_TABLET | Freq: Four times a day (QID) | ORAL | Status: DC | PRN

## 2022-06-04 MED ORDER — ALBUTEROL SULFATE (2.5 MG/3ML) 0.083% IN NEBU: 2.5000 mg | INHALATION_SOLUTION | RESPIRATORY_TRACT | Status: DC | PRN

## 2022-06-04 MED ORDER — HEPARIN SODIUM (PORCINE) 5000 UNIT/ML IJ SOLN: 5000.0000 [IU] | Freq: Three times a day (TID) | INTRAMUSCULAR | Status: DC

## 2022-06-04 MED ORDER — HEPARIN SODIUM (PORCINE) 5000 UNIT/ML IJ SOLN
5000.0000 [IU] | Freq: Three times a day (TID) | INTRAMUSCULAR | Status: DC
  Administered 2022-06-04 – 2022-06-12 (×23): 5000 [IU] via SUBCUTANEOUS
  Filled 2022-06-04 (×23): qty 1

## 2022-06-04 MED ORDER — ENSURE ENLIVE PO LIQD: 237.0000 mL | Freq: Two times a day (BID) | ORAL | 12 refills | Status: DC

## 2022-06-04 MED ORDER — POLYETHYLENE GLYCOL 3350 17 G PO PACK
17.0000 g | PACK | Freq: Every day | ORAL | Status: DC
  Administered 2022-06-04 – 2022-06-11 (×8): 17 g via ORAL
  Filled 2022-06-04 (×8): qty 1

## 2022-06-04 MED ORDER — DOCUSATE SODIUM 100 MG PO CAPS
100.0000 mg | ORAL_CAPSULE | Freq: Every day | ORAL | Status: DC
  Administered 2022-06-04 – 2022-06-11 (×8): 100 mg via ORAL
  Filled 2022-06-04 (×8): qty 1

## 2022-06-04 MED ORDER — HYDROMORPHONE HCL 2 MG PO TABS
8.0000 mg | ORAL_TABLET | Freq: Four times a day (QID) | ORAL | Status: DC
  Administered 2022-06-04 – 2022-06-12 (×31): 8 mg via ORAL
  Filled 2022-06-04 (×31): qty 4

## 2022-06-04 MED ORDER — ONDANSETRON HCL 4 MG/2ML IJ SOLN: 4.0000 mg | Freq: Four times a day (QID) | INTRAMUSCULAR | Status: DC | PRN

## 2022-06-04 MED ORDER — DULOXETINE HCL 30 MG PO CPEP
30.0000 mg | ORAL_CAPSULE | Freq: Two times a day (BID) | ORAL | Status: DC
  Administered 2022-06-04 – 2022-06-12 (×16): 30 mg via ORAL
  Filled 2022-06-04 (×16): qty 1

## 2022-06-04 NOTE — H&P (Signed)
Physical Medicine and Rehabilitation Admission H&P        Chief Complaint  Patient presents with   Back Pain  : back pain due to compression fx   HPI: Luis [name] is a 66 year old right-handed male with with history of Parkinson's disease maintained on Sinemet, chronic severe neuropathic pain, FSHD muscular dystrophy followed by Dr. [name] [facility].  Per chart review patient lives with spouse.  Uses a rolling walker for mobility.  He had been wearing AFOs for bilateral foot drop but did not feel they were effective.  Presented 05/27/2022 after mechanical fall with increased pain in the lumbar region.  He does have a history of prior kyphoplasty at L5 01/10/2020.  MRI images revealed acute compression fracture of L3 with approximately 40% vertebral body height loss.  Unchanged compression deformity of L5.  Underwent L3 balloon kyphoplasty 05/31/2022 per interventional radiology Dr.De Sindy Messing.  He was ordered a thoracolumbosacral orthotic brace but had trouble tolerating the brace.  He remains on Sinemet as prior to admission for history of Parkinson's disease.  Placed on subcutaneous heparin for DVT prophylaxis.  Maintained on Dilaudid for pain control at home due to painful Muscular dystrophy.. Therapy evaluations completed due to patient decreased functional mobility was admitted for a comprehensive rehab program.     Pt and wife report that pt is maintained on Dilaudid 8 mg 3-4x/day at home- usually takes breakfast, ~ 2pm and bedtime.  LBM this AM and needs miralax to go regularly.  Also c/o being cold -room at 72 degrees.      Review of Systems  Constitutional:  Negative for chills and fever.  HENT:  Negative for hearing loss.   Eyes:  Negative for blurred vision and double vision.  Respiratory:  Negative for cough and shortness of breath.   Cardiovascular:  Negative for chest pain, palpitations and leg swelling.  Gastrointestinal:  Positive for constipation.  Negative for abdominal pain, nausea and vomiting.       GERD  Genitourinary:  Positive for urgency. Negative for dysuria, flank pain and hematuria.  Musculoskeletal:  Positive for back pain, falls, joint pain and myalgias.  Skin:  Negative for rash.  Neurological:  Positive for tremors and weakness.  Psychiatric/Behavioral:         Anxiety  All other systems reviewed and are negative.       Past Medical History:  Diagnosis Date   Bilateral foot-drop 10/19/2018   Bursitis, trochanteric      Episodic   Carpal tunnel syndrome on right     Degenerative disk disease      l5-S1   FSH (facioscapulohumeral muscular dystrophy) ([place]) 10/07/2017   Gait abnormality 10/19/2018   GERD (gastroesophageal reflux disease)     Hemorrhoids      with anal fissures   Left lateral epicondylitis     Parkinson's disease 10/19/2018   Skin cancer      right temple area         Past Surgical History:  Procedure Laterality Date   CHOLECYSTECTOMY   2007   HEMORRHOIDECTOMY WITH HEMORRHOID BANDING       IR KYPHO LUMBAR INC FX REDUCE BONE BX UNI/BIL CANNULATION INC/IMAGING   01/10/2020   IR KYPHO LUMBAR INC FX REDUCE BONE BX UNI/BIL CANNULATION INC/IMAGING   05/31/2022         Family History  Problem Relation Age of Onset   Allergies Brother     Allergies Sister  Heart disease Father     Brain cancer Mother     Bone cancer Brother      Social History:  reports that he has never smoked. He has never used smokeless tobacco. He reports that he does not drink alcohol and does not use drugs. Allergies: No Known Allergies       Medications Prior to Admission  Medication Sig Dispense Refill   carbidopa-levodopa (SINEMET CR) 50-200 MG tablet Take 1 tablet by mouth in the morning, at noon, and at bedtime. 270 tablet 4   diazepam (VALIUM) 10 MG tablet Take 10 mg by mouth 2 (two) times daily as needed for anxiety.       Docusate Calcium (STOOL SOFTENER PO) Take 2 tablets by mouth at bedtime.        doxycycline (VIBRA-TABS) 100 MG tablet Take 100 mg by mouth 2 (two) times daily.       DULoxetine (CYMBALTA) 30 MG capsule Take 30 mg by mouth 2 (two) times daily.       gabapentin (NEURONTIN) 300 MG capsule Take 600 mg by mouth every evening.       HYDROmorphone (DILAUDID) 8 MG tablet Take 8 mg by mouth in the morning, at noon, in the evening, and at bedtime.       lactulose (CHRONULAC) 10 GM/15ML solution Take 15 mLs (10 g total) by mouth daily as needed for mild constipation. 500 mL 11   polyethylene glycol (MIRALAX / GLYCOLAX) 17 g packet Take 17 g by mouth at bedtime.       prednisoLONE acetate (PRED FORTE) 1 % ophthalmic suspension Place 1 drop into both eyes every other day.   0   Psyllium (METAMUCIL PO) Take 1 tablet by mouth daily.       zolpidem (AMBIEN) 10 MG tablet Take 10 mg by mouth at bedtime as needed for sleep.       gabapentin (NEURONTIN) 600 MG tablet Take 1 tablet (600 mg total) by mouth 4 (four) times daily. (Patient not taking: Reported on 05/27/2022) 360 tablet 4   naloxegol oxalate (MOVANTIK) 25 MG TABS tablet Take 25 mg by mouth daily as needed (For constipation). (Patient not taking: Reported on 05/27/2022)              Home: Home Living Family/patient expects to be discharged to:: Private residence Living Arrangements: Spouse/significant other Available Help at Discharge: Family, Available 24 hours/day Type of Home: House Home Access: Level entry, Ramped entrance (with lift) Home Layout: Two level Alternate Level Stairs-Number of Steps: stair lift Bathroom Shower/Tub: Multimedia programmer: Standard (Music therapist) Bathroom Accessibility: Yes Home Equipment: Conservation officer, nature (2 wheels), Toilet riser, Shower seat, Grab bars - tub/shower, Wheelchair - manual Additional Comments: electric commode lift, no other falls in past 6 months, lift chair  Lives With: Spouse   Functional History: Prior Function Prior Level of Function : Needs  assist Physical Assist : ADLs (physical) Mobility Comments: walks with RW independently, uses lift chair, lift commode seat, h/o B foot drop (had AFOs but stopped wearing them bc they weren't effective), able to get out of bed on his own per their report ADLs Comments: wife assists- needs assist for LB, he assists with UB, uses toilet lift but able to assist with toileting task, stands in show and has assist for bathing   Functional Status:  Mobility: Bed Mobility Overal bed mobility: Needs Assistance Bed Mobility: Supine to Sit Rolling: Min assist Sidelying to sit: +2 for physical assistance,  Max assist Supine to sit: Min assist Sit to supine: Mod assist, +2 for physical assistance General bed mobility comments: assist to guide LEs off bed, some use of bed rail, cues for log roll technique. Pt's wife able to demonstrate proper guarding technique for bed mobility. Transfers Overall transfer level: Needs assistance Equipment used: Rolling walker (2 wheels) Transfers: Sit to/from Stand Sit to Stand: Min assist General transfer comment: has to stand from significantly elevated surface due to his MD.  Pt unable to tolerate sitting in regular height recliner/chair. wife reports he cannot sit for longer than ~85mn. and required +2 assist from standard and lower surfaces Ambulation/Gait Ambulation/Gait assistance: Min assist, Mod assist Gait Distance (Feet): 85 Feet Assistive device: Rolling walker (2 wheels) Gait Pattern/deviations: Decreased dorsiflexion - left, Step-through pattern, Decreased stride length, Trunk flexed, Wide base of support, Step-to pattern General Gait Details: pt was able to tolerate slightly increased distance; required  x5 standing rest breaks leaning on wall for support.  foot drop on Left, hip hikes to compensate.  Trunk  is forward flex approx 45degrees and up to 90 degrees when fatigued. Gait velocity: decreased   ADL: ADL Overall ADL's : Needs  assistance/impaired Eating/Feeding: Set up, Bed level Grooming: Wash/dry hands, Wash/dry face, Oral care, Applying deodorant, Brushing hair, Moderate assistance, Bed level Upper Body Bathing: Maximal assistance, Sitting Lower Body Bathing: Maximal assistance, Sitting/lateral leans Upper Body Dressing : Maximal assistance, Sitting Lower Body Dressing: Sitting/lateral leans, Total assistance Toilet Transfer: Maximal assistance, +2 for physical assistance, BSC/3in1 Toilet Transfer Details (indicate cue type and reason): max x 2 to power up from BBad Axeand Hygiene: Total assistance, Sit to/from stand Functional mobility during ADLs: +2 for physical assistance, +2 for safety/equipment, Maximal assistance General ADL Comments: Needs max assist for bed mobility. Needs significantly elevated bed height to perform sit to stand with walker. Able to take steps with min x 2 in room. +2 for safety at all times.   Cognition: Cognition Overall Cognitive Status: Within Functional Limits for tasks assessed Orientation Level: Oriented X4 Cognition Arousal/Alertness: Awake/alert Behavior During Therapy: WFL for tasks assessed/performed Overall Cognitive Status: Within Functional Limits for tasks assessed General Comments: very motivated and eager to participate in therapy. Wife present throughout and encouraging and great advocate fo rpt.   Physical Exam: Blood pressure 104/62, pulse (!) 54, temperature 97.9 F (36.6 C), temperature source Oral, resp. rate 16, height '5\' 11"'$  (1.803 m), weight 67.4 kg, SpO2 99 %. Physical Exam Vitals and nursing note reviewed. Exam conducted with a chaperone present.  Constitutional:      Comments: Frail appearing male sitting EOB, wife and nurse are at bedside; awake, alert, appropriate, NAD- wife slightly anxious about care > pt  HENT:     Head: Normocephalic and atraumatic.     Comments: No facial droop- R forehead scab from skin cancer  removed.     Right Ear: External ear normal.     Left Ear: External ear normal.     Nose: Nose normal. No congestion.     Mouth/Throat:     Mouth: Mucous membranes are dry.     Pharynx: Oropharynx is clear. No oropharyngeal exudate.  Eyes:     General:        Right eye: No discharge.        Left eye: No discharge.     Extraocular Movements: Extraocular movements intact.  Cardiovascular:     Rate and Rhythm: Regular rhythm. Bradycardia present.  Heart sounds: Normal heart sounds. No murmur heard.    No gallop.  Pulmonary:     Effort: Pulmonary effort is normal. No respiratory distress.     Breath sounds: Normal breath sounds. No wheezing, rhonchi or rales.  Abdominal:     General: Bowel sounds are normal. There is no distension.     Palpations: Abdomen is soft.     Tenderness: There is no abdominal tenderness.  Musculoskeletal:     Cervical back: Neck supple. No tenderness.     Comments: RUE- deltoid 2/5; Biceps 2-/5; Triceps 2+/5; WE 5-/5; Grip 4+/5; FA 4-/5 LUE- same except FA 4+/5 RLE- HF 4-/5; KE 4+/5' DF 0/5; PF 4+/5 LLE- same except HF 4/5 Muscle atrophy in shoulder and hip girdles Adquate core strength- sitting EOB without support entire interview/exam   No TTP across low back around compression fx  Skin:    General: Skin is warm and dry.     Comments: Bandages on each side of spine around mid lumbar- C/D/I No IV seen  Neurological:     Mental Status: He is oriented to person, place, and time.     Comments: Patient is alert.  Oriented x3 and follows commands. Fast tremor RUE when at rest- from PD  Psychiatric:        Mood and Affect: Mood normal.        Behavior: Behavior normal.        Lab Results Last 48 Hours  No results found for this or any previous visit (from the past 48 hour(s)).   Imaging Results (Last 48 hours)  No results found.         Blood pressure 104/62, pulse (!) 54, temperature 97.9 F (36.6 C), temperature source Oral, resp. rate  16, height '5\' 11"'$  (1.803 m), weight 67.4 kg, SpO2 99 %.   Medical Problem List and Plan: 1. Functional deficits secondary to L3 compression fracture.  Status post kyphoplasty 9/29.  Back brace when out of bed.  May remove brace to shower.             -patient may  shower             -ELOS/Goals: 10-14 days- supervision to min A due to FSHD 2.  Antithrombotics: -DVT/anticoagulation:  Pharmaceutical: Heparin             -antiplatelet therapy: n/a 3. Pain Management/history of chronic severe neuropathic pain: Cymbalta 30 mg twice daily, Dilaudid 8 mg every 6 hours as needed- will change dilaudid to 8 mg QID so doesn't have to keep asking for it- takes scheduled at home.  4. Mood/Behavior/Sleep: Valium 10 mg twice daily as needed anxiety             -antipsychotic agents: N/A 5. Neuropsych/cognition: This patient is capable of making decisions on his own behalf. 6. Skin/Wound Care: Routine skin checks 7. Fluids/Electrolytes/Nutrition: Routine in and outs with follow-up chemistries 8.  Parkinson's disease.  Sinemet 50-200 mg 3 times daily 9.  History of FSHD muscular dystrophy.  Follow-up [facility] clinic Dr. Tillman Abide 10.  Constipation.  Colace 100 mg nightly, MiraLAX daily, Chronulac as needed 11. FSHD- can use his home trilegy for breathing issues due to Muscular dystrophy     35 minutes were spent during evaluation  I spent a total of 10 minutes reviewing chart and then 10 minutes d/w admissions coordinator- also 50 minutes in direct room with pt seeing pt/listening to pt/wife concerns- then 10 minutes typing up and another  10 minutes speaking with nursing and therapy about pt's unique issues. - so 95 minutes by me on pt.      I have personally performed a face to face diagnostic evaluation of this patient and formulated the key components of the plan.  Additionally, I have personally reviewed laboratory data, imaging studies, as well as relevant notes and concur with the physician assistant's  documentation above.   The patient's status has not changed from the original H&P.  Any changes in documentation from the acute care chart have been noted above.       Lavon Paganini Angiulli, PA-C 06/04/2022

## 2022-06-04 NOTE — Discharge Instructions (Signed)
Inpatient Rehab Discharge Instructions  [place]. Discharge date and time: No discharge date for patient encounter.   Activities/Precautions/ Functional Status: Activity: activity as tolerated with back brace when out of bed Diet: regular diet Wound Care: Routine skin checks Functional status:  ___ No restrictions     ___ Walk up steps independently ___ 24/7 supervision/assistance   ___ Walk up steps with assistance ___ Intermittent supervision/assistance  ___ Bathe/dress independently ___ Walk with walker     __x_ Bathe/dress with assistance ___ Walk Independently    ___ Shower independently ___ Walk with assistance    ___ Shower with assistance ___ No alcohol     ___ Return to work/school ________  Special Instructions: No driving smoking or alcohol   My questions have been answered and I understand these instructions. I will adhere to these goals and the provided educational materials after my discharge from the hospital.  Patient/Caregiver Signature _______________________________ Date __________  Clinician Signature _______________________________________ Date __________  Please bring this form and your medication list with you to all your follow-up doctor's appointments.

## 2022-06-04 NOTE — Progress Notes (Signed)
Inpatient Rehabilitation Admission Medication Review by a Pharmacist  A complete drug regimen review was completed for this patient to identify any potential clinically significant medication issues.  High Risk Drug Classes Is patient taking? Indication by Medication  Antipsychotic No   Anticoagulant Yes Heparin for VTE px  Antibiotic No   Opioid Yes Hydromorphone-pain  Antiplatelet No   Hypoglycemics/insulin No   Vasoactive Medication No   Chemotherapy No   Other Yes Sinemet-Parkinson's disease Diazepam-Muscle spasms Duloxetine-Depression/pain Lactulose-Constipation Miralax-Constipation Prednsiolone eye drops Docusate-Constipation Albuterol-Wheezing      Type of Medication Issue Identified Description of Issue Recommendation(s)  Drug Interaction(s) (clinically significant)     Duplicate Therapy     Allergy     No Medication Administration End Date     Incorrect Dose     Additional Drug Therapy Needed     Significant med changes from prior encounter (inform family/care partners about these prior to discharge).    Other       Clinically significant medication issues were identified that warrant physician communication and completion of prescribed/recommended actions by midnight of the next day:  No  Name of provider notified for urgent issues identified:   Provider Method of Notification:     Pharmacist comments:   Time spent performing this drug regimen review (minutes):  20   Murline Weigel A. Levada Dy, PharmD, BCPS, FNKF Clinical Pharmacist  Please utilize Amion for appropriate phone number to reach the unit pharmacist ([place])  06/04/2022 12:56 PM

## 2022-06-04 NOTE — Progress Notes (Signed)
Pt assisted placing on home machine.

## 2022-06-04 NOTE — Progress Notes (Addendum)
Inpatient Rehabilitation Admissions Coordinator   I have CIR bed available for his admit today. I spoke with patient and wife by phone,alerted Dr Maryland Pink, acute team and TOC. Dr Dagoberto Ligas will be admitting Rehab MD. Once bed is available at [facility], I will arrange Care link transport .  Danne Baxter, RN, MSN Rehab Admissions Coordinator [phone] 06/04/2022 10:21 AM  I have arranged Care link pickup time at 1300 to [place] with Dr. Dagoberto Ligas rehab MD admitting.  Danne Baxter, RN, MSN Rehab Admissions Coordinator [phone] 06/04/2022 11:35 AM

## 2022-06-04 NOTE — Discharge Summary (Signed)
[facility]  Physician Discharge Summary   Patient ID: Luis Maese Sedler Jr. MRN: [id number] DOB/AGE: 1956/01/27 66 y.o.  Admit date: 05/27/2022 Discharge date:   06/04/2022   PCP: [name], MD  DISCHARGE DIAGNOSES:    Compression fracture of lumbar vertebra [facility]) Parkinson's disease Muscular dystrophy  Patient being discharged to [facility] inpatient rehabilitation   CODE STATUS: DNR  DISCHARGE CONDITION: fair  Diet recommendation: As before  INITIAL HISTORY:  66 y.o. male with medical history significant of Parkinson's disease on [place], Chronic severe neuropathic pain, Muscular dystrophy followed by Dr [name] at [facility] clinic, chronic debility uses walker who presented after sustaining a fall about 3 days prior to admission.  He had pain in his lower back region.  Imaging studies showed L3 compression fracture.  Patient was hospitalized for further management.     Consultants: Neurosurgery.  Interventional radiology   Procedures: Kyphoplasty L3 vertebra 9/29    HOSPITAL COURSE:   L3 compression fracture Initially conservative management was being pursued with pain medications PT and TLSO brace.  However there was concern that his already limited functional quality of life may worsen with conservative approach.  Case was discussed with Dr. [name] again.  Kyphoplasty perhaps offers the quickest way of relief even though this may not work in every case.  This was communicated to the patient and his wife.  They wanted to proceed with kyphoplasty.  IR was reconsulted.  Patient underwent kyphoplasty on 9/29. Seems to be stable.  Pain seems to be improving.  Continues to have physical deconditioning.  Seen by PT and OT.  Inpatient rehabilitation is recommended.   Parkinson's disease Continue with Sinemet.  Stable   Muscular dystrophy Chronic.   Thrombocytopenia, transient,  Resolved.    Patient to go to [facility] inpatient rehabilitation today.  He is noted  to be stable this morning.   PERTINENT LABS:  The results of significant diagnostics from this hospitalization (including imaging, microbiology, ancillary and laboratory) are listed below for reference.    Microbiology: Recent Results (from the past 240 hour(s))  Resp Panel by RT-PCR (Flu A&B, Covid) Anterior Nasal Swab     Status: None   Collection Time: 05/27/22 11:11 AM   Specimen: Anterior Nasal Swab  Result Value Ref Range Status   SARS Coronavirus 2 by RT PCR NEGATIVE NEGATIVE Final    Comment: (NOTE) SARS-CoV-2 target nucleic acids are NOT DETECTED.  The SARS-CoV-2 RNA is generally detectable in upper respiratory specimens during the acute phase of infection. The lowest concentration of SARS-CoV-2 viral copies this assay can detect is 138 copies/mL. A negative result does not preclude SARS-Cov-2 infection and should not be used as the sole basis for treatment or other patient management decisions. A negative result may occur with  improper specimen collection/handling, submission of specimen other than nasopharyngeal swab, presence of viral mutation(s) within the areas targeted by this assay, and inadequate number of viral copies(<138 copies/mL). A negative result must be combined with clinical observations, patient history, and epidemiological information. The expected result is Negative.  Fact Sheet for Patients:  EntrepreneurPulse.com.au  Fact Sheet for Healthcare Providers:  IncredibleEmployment.be  This test is no t yet approved or cleared by the [place] FDA and  has been authorized for detection and/or diagnosis of SARS-CoV-2 by FDA under an Emergency Use Authorization (EUA). This EUA will remain  in effect (meaning this test can be used) for the duration of the COVID-19 declaration under Section 564(b)(1) of the Act, 21  U.S.C.section 360bbb-3(b)(1), unless the authorization is terminated  or revoked sooner.        Influenza A by PCR NEGATIVE NEGATIVE Final   Influenza B by PCR NEGATIVE NEGATIVE Final    Comment: (NOTE) The Xpert Xpress SARS-CoV-2/FLU/RSV plus assay is intended as an aid in the diagnosis of influenza from Nasopharyngeal swab specimens and should not be used as a sole basis for treatment. Nasal washings and aspirates are unacceptable for Xpert Xpress SARS-CoV-2/FLU/RSV testing.  Fact Sheet for Patients: EntrepreneurPulse.com.au  Fact Sheet for Healthcare Providers: IncredibleEmployment.be  This test is not yet approved or cleared by the [place] FDA and has been authorized for detection and/or diagnosis of SARS-CoV-2 by FDA under an Emergency Use Authorization (EUA). This EUA will remain in effect (meaning this test can be used) for the duration of the COVID-19 declaration under Section 564(b)(1) of the Act, 21 U.S.C. section 360bbb-3(b)(1), unless the authorization is terminated or revoked.  Performed at [facility], [place] [address] [place], [place] [address]      Labs:   Basic Metabolic Panel: Recent Labs  Lab 05/30/22 0332 06/01/22 0350  NA 139 137  K 4.3 4.3  CL 106 105  CO2 26 28  GLUCOSE 115* 100*  BUN 9 15  CREATININE 0.40* 0.35*  CALCIUM 9.2 8.9    CBC: Recent Labs  Lab 05/30/22 0332 06/01/22 0350  WBC 4.5 4.7  HGB 13.8 13.7  HCT 41.6 41.2  MCV 97.4 97.2  PLT 172 192      IMAGING STUDIES IR KYPHO LUMBAR INC FX REDUCE BONE BX UNI/BIL CANNULATION INC/IMAGING  Result Date: 06/03/2022 INDICATION: 66 year old male with acute compression fracture of the L3 vertebral body with severe pain, not improving with medical management. EXAM: FLUOROSCOPY GUIDED L3 BALLOON KYPHOPLASTY COMPARISON:  MRI of the lumbar spine May 28, 2022. MEDICATIONS: As antibiotic prophylaxis, Ancef 2 g was ordered pre-procedure and administered intravenously within 1 hour of incision. All current medications are  in the EMR and have been reviewed as part of this encounter. ANESTHESIA/SEDATION: Moderate (conscious) sedation was employed during this procedure. A total of Versed 1.5 mg and Fentanyl 75 mcg was administered intravenously by the radiology nurse. Total intra-service moderate Sedation Time: 33 minutes. The patient's level of consciousness and vital signs were monitored continuously by radiology nursing throughout the procedure under my direct supervision. FLUOROSCOPY: Radiation Exposure Index (as provided by the fluoroscopic device): PSV 4,431 mGy Kerma COMPLICATIONS: None immediate. PROCEDURE: Following a full explanation of the procedure along with the potential associated complications, an informed witnessed consent was obtained. The patient was placed in prone position on the angiography table. The lumbar spine region was prepped and draped in a sterile fashion. Under fluoroscopy, the L3 vertebral body was delineated and the skin area was marked. The skin was infiltrated with a 1% Lidocaine approximately 5 cm lateral to the spinous process projection on the right. Using a 22-gauge spinal needle, the soft issue and the peripedicular space and periosteum were infiltrated with Bupivacaine 0.5%. A skin incision was made at the access site. Subsequently, an 11-gauge Kyphon trocar was inserted under fluoroscopic guidance until contact with the pedicle was obtained. The trocar was inserted under light hammer tapping into the pedicle until the posterior boundaries of the vertebral body was reached. The skin was infiltrated with a 1% Lidocaine approximately 5 cm lateral to the spinous process projection on the left. Using a 22-gauge spinal needle, the soft issue and the peripedicular space and periosteum  were infiltrated with Bupivacaine 0.5%. A skin incision was made at the access site. Subsequently, an 11-gauge Kyphon trocar was inserted under fluoroscopic guidance until contact with the pedicle was obtained. The  trocar was inserted under light hammer tapping into the pedicle until the posterior boundaries of the vertebral body was reached. The diamond mandrill was removed. A bone drill was coaxially advanced within the anterior third of the vertebral body and then exchanged for inflatable Kyphon balloons. These were centered within the mid-aspect of the vertebral body. The balloons were inflated to create a void to serve as a repository for the bone cement. Both balloons were deflated and through both cannulas, under continuous fluoroscopy guidance in the AP and lateral views, the vertebral body was filled with previously mixed polymethyl-methacrylate (PMMA) added to barium for opacification. Both cannulas were later removed. Post procedural radiographs showed adequate distribution of the cement within the L3 vertebral body without evidence of significant leakage or embolism. The access sites were cleaned and covered with a sterile bandage. IMPRESSION: 1. Successful fluoroscopy-guided bilateral transpedicular approach for L3 vertebral body balloon kyphoplasty for treatment of osteoporotic fragility fracture. 2. If the patient has known osteoporosis, recommend treatment as clinically indicated. If the patient's bone density status is unknown, DEXA scan is recommended. Electronically Signed   By: Pedro Earls M.D.   On: 06/03/2022 13:21   MR LUMBAR SPINE WO CONTRAST  Result Date: 05/29/2022 CLINICAL DATA:  Compression fracture on 05/27/2022 CT lumbar spine EXAM: MRI LUMBAR SPINE WITHOUT CONTRAST TECHNIQUE: Multiplanar, multisequence MR imaging of the lumbar spine was performed. No intravenous contrast was administered. COMPARISON:  MRI lumbar spine 07/22/2020, correlation is also made with CT lumbar spine 05/27/2022 FINDINGS: Segmentation:  5 lumbar-type vertebral bodies. Alignment: Mild levocurvature. Straightening of the normal lumbar lordosis. No listhesis. Vertebrae: Acute compression fracture of L3  with approximately 40% vertebral body height loss. No significant retropulsion of the posterior cortex. Redemonstrated compression deformity of L5, status post kyphoplasty, which appears unchanged from 07/14/2020. Approximately 4 mm retropulsion of the posterosuperior cortex of L5, unchanged. Increased T2 signal at the inferior aspect of L4 (series 10, image 5-8), without significant vertebral body height loss, possibly edema. Congenitally short pedicles, which narrow the AP diameter of the spinal canal. Conus medullaris and cauda equina: Conus extends to the L1 level. Conus and cauda equina appear normal. Paraspinal and other soft tissues: Atrophy of the paraspinous and psoas muscles Disc levels: T12-L1: No significant disc bulge. No spinal canal stenosis or neural foraminal narrowing. L1-L2: Minimal disc bulge. Small left foraminal protrusion. Mild facet arthropathy. No spinal canal stenosis. No neural foraminal narrowing. L2-L3: Mild disc bulge. Mild facet arthropathy. Narrowing of the lateral recesses, which could affect the descending L3 nerve roots. Mild spinal canal stenosis, unchanged. No neural foraminal narrowing. L3-L4: Mild disc bulge with left foraminal and extreme lateral protrusion. Moderate facet arthropathy. Narrowing of the lateral recesses, which could affect the descending L4 nerve roots. Mild spinal canal stenosis, unchanged. No neural foraminal narrowing. L4-L5: Disc height loss and broad-based disc bulge. Retropulsion of the posterosuperior cortex of L5. Moderate facet arthropathy. Ligamentum flavum hypertrophy. Mild spinal canal stenosis. Narrowing of the lateral recesses, which could affect the descending L5 nerve roots. Mild left-greater-than-right neural foraminal narrowing, unchanged. L5-S1: Mild disc bulge. Mild facet arthropathy. No spinal canal stenosis. Mild-to-moderate left neural foraminal narrowing, unchanged. IMPRESSION: 1. Acute compression fracture of L3 with approximately 40%  vertebral body height loss. 2. Unchanged compression deformity of L5, status  post kyphoplasty. 3. Increased T2 signal at the inferior aspect of L4, likely edema, without significant vertebral body height loss to suggest additional compression fracture. 4. Otherwise stable degenerative changes, superimposed on short pedicles, which results in mild spinal canal stenosis at L2-L3, L3-L4, and L4-L5, as well as mild and mild-to-moderate neural foraminal narrowing, as described above. Electronically Signed   By: Merilyn Baba M.D.   On: 05/29/2022 01:35   DG Finger Middle Right  Result Date: 05/27/2022 CLINICAL DATA:  Fall clinically with distal phalanx bruising and fracture EXAM: RIGHT MIDDLE FINGER 2+V COMPARISON:  May 26, 2022 FINDINGS: Osteopenia. Revisualization of a fracture of the base of the distal third phalanx with intra-articular extension. Fracture is in unchanged alignment. Associated soft tissue edema. No additional fractures noted. IMPRESSION: Similar appearance of a fracture of the base of the distal third phalanx. Electronically Signed   By: Valentino Saxon M.D.   On: 05/27/2022 13:55   CT Lumbar Spine Wo Contrast  Result Date: 05/27/2022 CLINICAL DATA:  Lumbar compression fracture. Back pain. Recent fall. EXAM: CT LUMBAR SPINE WITHOUT CONTRAST TECHNIQUE: Multidetector CT imaging of the lumbar spine was performed without intravenous contrast administration. Multiplanar CT image reconstructions were also generated. RADIATION DOSE REDUCTION: This exam was performed according to the departmental dose-optimization program which includes automated exposure control, adjustment of the mA and/or kV according to patient size and/or use of iterative reconstruction technique. COMPARISON:  Lumbar spine radiographs 05/26/2022. Lumbar spine MRI 07/14/2020. FINDINGS: Segmentation: 5 lumbar type vertebrae. Alignment: No significant listhesis. Vertebrae: Acute fracture of the L3 vertebral body with  compression of the superior and inferior endplates resulting in 86% vertebral body height loss. No retropulsion or posterior element fracture. Chronic, previously augmented L5 compression fracture. No suspicious osseous lesion. Paraspinal and other soft tissues: Marked fatty atrophy of the posterior paraspinal musculature bilaterally with history of muscular dystrophy. Asymmetric atrophy of the right psoas muscle. Mild paravertebral soft tissue edema at L3. Minimal abdominal aortic atherosclerosis without aneurysm. Disc levels: T12-L1: Negative. L1-2: Mild disc bulging and mild facet arthrosis without evidence of significant stenosis, similar to the prior MRI. L2-3: Disc bulging and moderate facet hypertrophy result in mild spinal stenosis and mild right greater than left neural foraminal stenosis, similar to the prior MRI. L3-4: Circumferential disc bulging greater to the left and moderate facet hypertrophy result in mild spinal stenosis and mild left greater than right neural foraminal stenosis, similar to the prior MRI. L4-5: Moderate disc space narrowing. Circumferential disc bulging and moderate facet hypertrophy result in mild spinal stenosis and moderate bilateral neural foraminal stenosis, similar to the prior MRI. L5-S1: Disc bulging, a chronic left foraminal disc protrusion, and mild right and moderate left facet hypertrophy result in mild-to-moderate left neural foraminal stenosis without spinal stenosis, similar to the prior MRI. IMPRESSION: 1. Acute L3 compression fracture with 35% height loss. 2. Chronic L5 compression fracture. 3. Unchanged lumbar disc and facet degeneration with mild spinal stenosis from L2-3 to L4-5 and moderate neural foraminal stenosis at L4-5. 4. Aortic Atherosclerosis (ICD10-I70.0). Electronically Signed   By: Logan Bores M.D.   On: 05/27/2022 12:00    DISCHARGE EXAMINATION: Vitals:   06/03/22 1335 06/03/22 2204 06/04/22 0528 06/04/22 0530  BP: 119/81 95/65  104/62   Pulse: 64 65  (!) 54  Resp: '16 16  16  '$ Temp: 97.9 F (36.6 C) 98.4 F (36.9 C)  97.9 F (36.6 C)  TempSrc:  Oral  Oral  SpO2: 100% 99%  99%  Weight:   67.4 kg   Height:       General appearance: Awake alert.  In no distress Resp: Clear to auscultation bilaterally.  Normal effort Cardio: S1-S2 is normal regular.  No S3-S4.  No rubs murmurs or bruit GI: Abdomen is soft.  Nontender nondistended.  Bowel sounds are present normal.  No masses organomegaly   DISPOSITION: CIR  Discharge Instructions     Call MD for:  difficulty breathing, headache or visual disturbances   Complete by: As directed    Call MD for:  extreme fatigue   Complete by: As directed    Call MD for:  persistant dizziness or light-headedness   Complete by: As directed    Call MD for:  persistant nausea and vomiting   Complete by: As directed    Call MD for:  severe uncontrolled pain   Complete by: As directed    Call MD for:  temperature >100.4   Complete by: As directed    Diet general   Complete by: As directed    No wound care   Complete by: As directed           Allergies as of 06/04/2022   No Known Allergies      Medication List     STOP taking these medications    doxycycline 100 MG tablet Commonly known as: VIBRA-TABS   naloxegol oxalate 25 MG Tabs tablet Commonly known as: MOVANTIK       TAKE these medications    carbidopa-levodopa 50-200 MG tablet Commonly known as: SINEMET CR Take 1 tablet by mouth in the morning, at noon, and at bedtime.   diazepam 10 MG tablet Commonly known as: VALIUM Take 10 mg by mouth 2 (two) times daily as needed for anxiety.   DULoxetine 30 MG capsule Commonly known as: CYMBALTA Take 30 mg by mouth 2 (two) times daily.   feeding supplement Liqd Take 237 mLs by mouth 2 (two) times daily between meals.   gabapentin 300 MG capsule Commonly known as: NEURONTIN Take 600 mg by mouth every evening. What changed: Another medication with the  same name was removed. Continue taking this medication, and follow the directions you see here.   HYDROmorphone 8 MG tablet Commonly known as: DILAUDID Take 8 mg by mouth in the morning, at noon, in the evening, and at bedtime.   lactulose 10 GM/15ML solution Commonly known as: CHRONULAC Take 15 mLs (10 g total) by mouth daily as needed for mild constipation.   METAMUCIL PO Take 1 tablet by mouth daily.   polyethylene glycol 17 g packet Commonly known as: MIRALAX / GLYCOLAX Take 17 g by mouth at bedtime.   prednisoLONE acetate 1 % ophthalmic suspension Commonly known as: PRED FORTE Place 1 drop into both eyes every other day.   STOOL SOFTENER PO Take 2 tablets by mouth at bedtime.   zolpidem 10 MG tablet Commonly known as: AMBIEN Take 10 mg by mouth at bedtime as needed for sleep.           TOTAL DISCHARGE TIME: 35 minutes  Dakai Braithwaite [employer] on [email or web address]  06/04/2022, 10:30 AM

## 2022-06-04 NOTE — Plan of Care (Signed)
  Problem: Activity: Goal: Risk for activity intolerance will decrease Outcome: Progressing   Problem: Pain Managment: Goal: General experience of comfort will improve Outcome: Progressing   Problem: Safety: Goal: Ability to remain free from injury will improve Outcome: Progressing   

## 2022-06-04 NOTE — H&P (Signed)
Physical Medicine and Rehabilitation Admission H&P    Chief Complaint  Patient presents with   Back Pain  : back pain due to compression fx  HPI: Luis [name] is a 66 year old right-handed male with with history of Parkinson's disease maintained on Sinemet, chronic severe neuropathic pain, FSHD muscular dystrophy followed by Dr. [name].  Per chart review patient lives with spouse.  Uses a rolling walker for mobility.  He had been wearing AFOs for bilateral foot drop but did not feel they were effective.  Presented 05/27/2022 after mechanical fall with increased pain in the lumbar region.  He does have a history of prior kyphoplasty at L5 01/10/2020.  MRI images revealed acute compression fracture of L3 with approximately 40% vertebral body height loss.  Unchanged compression deformity of L5.  Underwent L3 balloon kyphoplasty 05/31/2022 per interventional radiology Dr.De Sindy Messing.  He was ordered a thoracolumbosacral orthotic brace but had trouble tolerating the brace.  He remains on Sinemet as prior to admission for history of Parkinson's disease.  Placed on subcutaneous heparin for DVT prophylaxis.  Maintained on Dilaudid for pain control at home due to painful Muscular dystrophy.. Therapy evaluations completed due to patient decreased functional mobility was admitted for a comprehensive rehab program.   Pt and wife report that pt is maintained on Dilaudid 8 mg 3-4x/day at home- usually takes breakfast, ~ 2pm and bedtime.  LBM this AM and needs miralax to go regularly.  Also c/o being cold -room at 72 degrees.    Review of Systems  Constitutional:  Negative for chills and fever.  HENT:  Negative for hearing loss.   Eyes:  Negative for blurred vision and double vision.  Respiratory:  Negative for cough and shortness of breath.   Cardiovascular:  Negative for chest pain, palpitations and leg swelling.  Gastrointestinal:  Positive for constipation. Negative for  abdominal pain, nausea and vomiting.       GERD  Genitourinary:  Positive for urgency. Negative for dysuria, flank pain and hematuria.  Musculoskeletal:  Positive for back pain, falls, joint pain and myalgias.  Skin:  Negative for rash.  Neurological:  Positive for tremors and weakness.  Psychiatric/Behavioral:         Anxiety  All other systems reviewed and are negative.  Past Medical History:  Diagnosis Date   Bilateral foot-drop 10/19/2018   Bursitis, trochanteric    Episodic   Carpal tunnel syndrome on right    Degenerative disk disease    l5-S1   FSH (facioscapulohumeral muscular dystrophy) ([place]) 10/07/2017   Gait abnormality 10/19/2018   GERD (gastroesophageal reflux disease)    Hemorrhoids    with anal fissures   Left lateral epicondylitis    Parkinson's disease 10/19/2018   Skin cancer    right temple area   Past Surgical History:  Procedure Laterality Date   CHOLECYSTECTOMY  2007   HEMORRHOIDECTOMY WITH HEMORRHOID BANDING     IR [place] FX REDUCE BONE BX UNI/BIL CANNULATION INC/IMAGING  01/10/2020   IR KYPHO LUMBAR INC FX REDUCE BONE BX UNI/BIL CANNULATION INC/IMAGING  05/31/2022   Family History  Problem Relation Age of Onset   Allergies Brother    Allergies Sister    Heart disease Father    Brain cancer Mother    Bone cancer Brother    Social History:  reports that he has never smoked. He has never used smokeless tobacco. He reports that he does not drink alcohol and does not  use drugs. Allergies: No Known Allergies Medications Prior to Admission  Medication Sig Dispense Refill   carbidopa-levodopa (SINEMET CR) 50-200 MG tablet Take 1 tablet by mouth in the morning, at noon, and at bedtime. 270 tablet 4   diazepam (VALIUM) 10 MG tablet Take 10 mg by mouth 2 (two) times daily as needed for anxiety.     Docusate Calcium (STOOL SOFTENER PO) Take 2 tablets by mouth at bedtime.     doxycycline (VIBRA-TABS) 100 MG tablet Take 100 mg by mouth 2 (two) times  daily.     DULoxetine (CYMBALTA) 30 MG capsule Take 30 mg by mouth 2 (two) times daily.     gabapentin (NEURONTIN) 300 MG capsule Take 600 mg by mouth every evening.     HYDROmorphone (DILAUDID) 8 MG tablet Take 8 mg by mouth in the morning, at noon, in the evening, and at bedtime.     lactulose (CHRONULAC) 10 GM/15ML solution Take 15 mLs (10 g total) by mouth daily as needed for mild constipation. 500 mL 11   polyethylene glycol (MIRALAX / GLYCOLAX) 17 g packet Take 17 g by mouth at bedtime.     prednisoLONE acetate (PRED FORTE) 1 % ophthalmic suspension Place 1 drop into both eyes every other day.  0   Psyllium (METAMUCIL PO) Take 1 tablet by mouth daily.     zolpidem (AMBIEN) 10 MG tablet Take 10 mg by mouth at bedtime as needed for sleep.     gabapentin (NEURONTIN) 600 MG tablet Take 1 tablet (600 mg total) by mouth 4 (four) times daily. (Patient not taking: Reported on 05/27/2022) 360 tablet 4   naloxegol oxalate (MOVANTIK) 25 MG TABS tablet Take 25 mg by mouth daily as needed (For constipation). (Patient not taking: Reported on 05/27/2022)        Home: Home Living Family/patient expects to be discharged to:: Private residence Living Arrangements: Spouse/significant other Available Help at Discharge: Family, Available 24 hours/day Type of Home: House Home Access: Level entry, Ramped entrance (with lift) Home Layout: Two level Alternate Level Stairs-Number of Steps: stair lift Bathroom Shower/Tub: Multimedia programmer: Standard (Music therapist) Bathroom Accessibility: Yes Home Equipment: Conservation officer, nature (2 wheels), Toilet riser, Shower seat, Grab bars - tub/shower, Wheelchair - manual Additional Comments: electric commode lift, no other falls in past 6 months, lift chair  Lives With: Spouse   Functional History: Prior Function Prior Level of Function : Needs assist Physical Assist : ADLs (physical) Mobility Comments: walks with RW independently, uses lift chair,  lift commode seat, h/o B foot drop (had AFOs but stopped wearing them bc they weren't effective), able to get out of bed on his own per their report ADLs Comments: wife assists- needs assist for LB, he assists with UB, uses toilet lift but able to assist with toileting task, stands in show and has assist for bathing  Functional Status:  Mobility: Bed Mobility Overal bed mobility: Needs Assistance Bed Mobility: Supine to Sit Rolling: Min assist Sidelying to sit: +2 for physical assistance, Max assist Supine to sit: Min assist Sit to supine: Mod assist, +2 for physical assistance General bed mobility comments: assist to guide LEs off bed, some use of bed rail, cues for log roll technique. Pt's wife able to demonstrate proper guarding technique for bed mobility. Transfers Overall transfer level: Needs assistance Equipment used: Rolling walker (2 wheels) Transfers: Sit to/from Stand Sit to Stand: Min assist General transfer comment: has to stand from significantly elevated surface due to  his MD.  Pt unable to tolerate sitting in regular height recliner/chair. wife reports he cannot sit for longer than ~5mn. and required +2 assist from standard and lower surfaces Ambulation/Gait Ambulation/Gait assistance: Min assist, Mod assist Gait Distance (Feet): 85 Feet Assistive device: Rolling walker (2 wheels) Gait Pattern/deviations: Decreased dorsiflexion - left, Step-through pattern, Decreased stride length, Trunk flexed, Wide base of support, Step-to pattern General Gait Details: pt was able to tolerate slightly increased distance; required  x5 standing rest breaks leaning on wall for support.  foot drop on Left, hip hikes to compensate.  Trunk  is forward flex approx 45degrees and up to 90 degrees when fatigued. Gait velocity: decreased    ADL: ADL Overall ADL's : Needs assistance/impaired Eating/Feeding: Set up, Bed level Grooming: Wash/dry hands, Wash/dry face, Oral care, Applying  deodorant, Brushing hair, Moderate assistance, Bed level Upper Body Bathing: Maximal assistance, Sitting Lower Body Bathing: Maximal assistance, Sitting/lateral leans Upper Body Dressing : Maximal assistance, Sitting Lower Body Dressing: Sitting/lateral leans, Total assistance Toilet Transfer: Maximal assistance, +2 for physical assistance, BSC/3in1 Toilet Transfer Details (indicate cue type and reason): max x 2 [place] Hy[place]: Total assistance, Sit to/from stand Functional mobility during ADLs: +2 for physical assistance, +2 for safety/equipment, Maximal assistance General ADL Comments: Needs max assist for bed mobility. Needs significantly elevated bed height to perform sit to stand with walker. Able to take steps with min x 2 in room. +2 for safety at all times.  Cognition: Cognition Overall Cognitive Status: Within Functional Limits for tasks assessed Orientation Level: Oriented X4 Cognition Arousal/Alertness: Awake/alert Behavior During Therapy: WFL for tasks assessed/performed Overall Cognitive Status: Within Functional Limits for tasks assessed General Comments: very motivated and eager to participate in therapy. Wife present throughout and encouraging and great advocate fo rpt.  Physical Exam: Blood pressure 104/62, pulse (!) 54, temperature 97.9 F (36.6 C), temperature source Oral, resp. rate 16, height '5\' 11"'$  (1.803 m), weight 67.4 kg, SpO2 99 %. Physical Exam Vitals and nursing note reviewed. Exam conducted with a chaperone present.  Constitutional:      Comments: Frail appearing male sitting EOB, wife and nurse are at bedside; awake, alert, appropriate, NAD- wife slightly anxious about care > pt  HENT:     Head: Normocephalic and atraumatic.     Comments: No facial droop- R forehead scab from skin cancer removed.     Right Ear: External ear normal.     Left Ear: External ear normal.     Nose: Nose normal. No congestion.      Mouth/Throat:     Mouth: Mucous membranes are dry.     Pharynx: Oropharynx is clear. No oropharyngeal exudate.  Eyes:     General:        Right eye: No discharge.        Left eye: No discharge.     Extraocular Movements: Extraocular movements intact.  Cardiovascular:     Rate and Rhythm: Regular rhythm. Bradycardia present.     Heart sounds: Normal heart sounds. No murmur heard.    No gallop.  Pulmonary:     Effort: Pulmonary effort is normal. No respiratory distress.     Breath sounds: Normal breath sounds. No wheezing, rhonchi or rales.  Abdominal:     General: Bowel sounds are normal. There is no distension.     Palpations: Abdomen is soft.     Tenderness: There is no abdominal tenderness.  Musculoskeletal:  Cervical back: Neck supple. No tenderness.     Comments: RUE- deltoid 2/5; Biceps 2-/5; Triceps 2+/5; WE 5-/5; Grip 4+/5; FA 4-/5 LUE- same except FA 4+/5 RLE- HF 4-/5; KE 4+/5' DF 0/5; PF 4+/5 LLE- same except HF 4/5 Muscle atrophy in shoulder and hip girdles Adquate core strength- sitting EOB without support entire interview/exam  No TTP across low back around compression fx  Skin:    General: Skin is warm and dry.     Comments: Bandages on each side of spine around mid lumbar- C/D/I No IV seen  Neurological:     Mental Status: He is oriented to person, place, and time.     Comments: Patient is alert.  Oriented x3 and follows commands. Fast tremor RUE when at rest- from PD  Psychiatric:        Mood and Affect: Mood normal.        Behavior: Behavior normal.     No results found for this or any previous visit (from the past 48 hour(s)). No results found.    Blood pressure 104/62, pulse (!) 54, temperature 97.9 F (36.6 C), temperature source Oral, resp. rate 16, height '5\' 11"'$  (1.803 m), weight 67.4 kg, SpO2 99 %.  Medical Problem List and Plan: 1. Functional deficits secondary to L3 compression fracture.  Status post kyphoplasty 9/29.  Back brace  when out of bed.  May remove brace to shower.  -patient may  shower  -ELOS/Goals: 10-14 days- supervision to min A due to FSHD 2.  Antithrombotics: -DVT/anticoagulation:  Pharmaceutical: Heparin  -antiplatelet therapy: n/a 3. Pain Management/history of chronic severe neuropathic pain: Cymbalta 30 mg twice daily, Dilaudid 8 mg every 6 hours as needed- will change dilaudid to 8 mg QID so doesn't have to keep asking for it- takes scheduled at home.  4. Mood/Behavior/Sleep: Valium 10 mg twice daily as needed anxiety  -antipsychotic agents: N/A 5. Neuropsych/cognition: This patient is capable of making decisions on his own behalf. 6. Skin/Wound Care: Routine skin checks 7. Fluids/Electrolytes/Nutrition: Routine in and outs with follow-up chemistries 8.  Parkinson's disease.  Sinemet 50-200 mg 3 times daily 9.  History of FSHD muscular dystrophy.  Follow-up [facility] Dr. Tillman Abide 10.  Constipation.  Colace 100 mg nightly, MiraLAX daily, Chronulac as needed 11. FSHD- can use his home trilegy for breathing issues due to Muscular dystrophy   35 minutes were spent during evaluation  I spent a total of 10 minutes reviewing chart and then 10 minutes d/w admissions coordinator- also 50 minutes in direct room with pt seeing pt/listening to pt/wife concerns- then 10 minutes typing up and another 10 minutes speaking with nursing and therapy about pt's unique issues. - so 95 minutes by me on pt.    I have personally performed a face to face diagnostic evaluation of this patient and formulated the key components of the plan.  Additionally, I have personally reviewed laboratory data, imaging studies, as well as relevant notes and concur with the physician assistant's documentation above.   The patient's status has not changed from the original H&P.  Any changes in documentation from the acute care chart have been noted above.     Luis Paganini Angiulli, PA-C 06/04/2022

## 2022-06-04 NOTE — Progress Notes (Signed)
Courtney Heys, MD  Physician Other PMR Pre-admission    Signed Date of Service:  05/31/2022  2:00 PM  Related encounter: ED to Hosp-Admission (Discharged) from 05/27/2022 in [place]      PMR Admission Coordinator Pre-Admission Assessment   Patient: Luis [name]. is an 66 y.o., male MRN: [id number] DOB: [birth year] Height: '5\' 11"'$  (180.3 cm) Weight: 67.4 kg   Insurance Information HMO:     PPO:      PCP:      IPA:      80/20:      OTHER:  PRIMARY: Medicare a and b      Policy#: [id number]      Subscriber: pt Benefits:  Phone #: passport one source      Name: 05/31/22 Eff. Date: 05/03/20     Deduct: $1600      Out of Pocket Max: none      Life Max: none CIR: 100%      SNF: 20 dull days Outpatient: 80%     Co-Pay: 20% Home Health: 100%      Co-Pay: none DME: 80%     Co-Pay: 20% Providers: in network  SECONDARY: Engineer, production supplement      Policy#: [id number]   Financial Counselor:       Phone#:    The "Data Collection Information Summary" for patients in Inpatient Rehabilitation Facilities with attached "[place] Records" was provided and verbally reviewed with: Patient and Family   Emergency Contact Information Contact Information       Name Relation Home Work Mobile    Schippers,Sandra Spouse [phone]   [phone]         Current Medical History  Patient Admitting Diagnosis: L3 compression fracture, debility   History of Present Illness: 66 year old right-handed male with with history of Parkinson's disease maintained on Sinemet, chronic severe neuropathic pain, muscular dystrophy followed by Dr. [name].   Presented 05/27/2022 after mechanical fall with increased pain in the lumbar region.  He does have a history of prior kyphoplasty at L5 01/10/2020.  MRI images revealed acute compression fracture of L3 with approximately 40% vertebral body height loss.  Unchanged compression deformity of L5.  Underwent  L3 balloon kyphoplasty 05/31/2022 per interventional radiology Dr.De Sindy Messing.  He was ordered a thoracolumbosacral orthotic brace but had trouble tolerating the brace.  He remains on Sinemet as prior to admission for history of Parkinson's disease.  Placed on subcutaneous heparin for DVT prophylaxis.  Maintained on Dilaudid for pain control.     Patient's medical record from [facility] has been reviewed by the rehabilitation admission coordinator and physician.   Past Medical History      Past Medical History:  Diagnosis Date   Bilateral foot-drop 10/19/2018   Bursitis, trochanteric      Episodic   Carpal tunnel syndrome on right     Degenerative disk disease      l5-S1   FSH (facioscapulohumeral muscular dystrophy) ([place]) 10/07/2017   Gait abnormality 10/19/2018   GERD (gastroesophageal reflux disease)     Hemorrhoids      with anal fissures   Left lateral epicondylitis     Parkinson's disease 10/19/2018   Skin cancer      right temple area    Has the patient had major surgery during 100 days prior to admission? Yes   Family History   family history includes Allergies in his brother and sister; Bone cancer  in his brother; Brain cancer in his mother; Heart disease in his father.   Current Medications   Current Facility-Administered Medications:    albuterol (PROVENTIL) (2.5 MG/3ML) 0.083% nebulizer solution 2.5 mg, 2.5 mg, Nebulization, Q2H PRN, Myles Rosenthal A, MD   carbidopa-levodopa (SINEMET CR) 50-200 MG per tablet controlled release 1 tablet, 1 tablet, Oral, TID, Myles Rosenthal A, MD, 1 tablet at 06/04/22 0908   diazepam (VALIUM) tablet 10 mg, 10 mg, Oral, BID PRN, Myles Rosenthal A, MD, 10 mg at 06/02/22 0003   docusate sodium (COLACE) capsule 100 mg, 100 mg, Oral, QHS, Myles Rosenthal A, MD, 100 mg at 06/03/22 2259   DULoxetine (CYMBALTA) DR capsule 30 mg, 30 mg, Oral, BID, Myles Rosenthal A, MD, 30 mg at 06/04/22 0908   feeding supplement  (ENSURE ENLIVE / ENSURE PLUS) liquid 237 mL, 237 mL, Oral, BID BM, Bonnielee Haff, MD, 237 mL at 06/01/22 1547   heparin injection 5,000 Units, 5,000 Units, Subcutaneous, Q8H, Allred, Darrell K, PA-C, 5,000 Units at 06/04/22 0540   HYDROmorphone (DILAUDID) injection 1 mg, 1 mg, Intravenous, Q3H PRN, Bonnielee Haff, MD, 1 mg at 05/31/22 0656   HYDROmorphone (DILAUDID) tablet 8 mg, 8 mg, Oral, Q6H PRN, Bonnielee Haff, MD, 8 mg at 06/04/22 0908   lactulose (CHRONULAC) 10 GM/15ML solution 10 g, 10 g, Oral, Daily PRN, Myles Rosenthal A, MD   ondansetron (ZOFRAN) tablet 4 mg, 4 mg, Oral, Q6H PRN **OR** ondansetron (ZOFRAN) injection 4 mg, 4 mg, Intravenous, Q6H PRN, Clance Boll, MD   Oral care mouth rinse, 15 mL, Mouth Rinse, PRN, Myles Rosenthal A, MD   polyethylene glycol (MIRALAX / GLYCOLAX) packet 17 g, 17 g, Oral, QHS, Bonnielee Haff, MD, 17 g at 06/03/22 2259   prednisoLONE acetate (PRED FORTE) 1 % ophthalmic suspension 1 drop, 1 drop, Both Eyes, Mariana Arn, MD, 1 drop at 06/02/22 1727   Patients Current Diet:  Diet Order                  Diet regular Room service appropriate? Yes; Fluid consistency: Thin  Diet effective now                       Precautions / Restrictions Precautions Precautions: Back Precaution Comments: Kyphoplasty 05/31/22 Spinal Brace: Thoracolumbosacral orthotic (unable to tolerate brace) Restrictions Weight Bearing Restrictions: No Other Position/Activity Restrictions: pt/wife refused TLSO    Has the patient had 2 or more falls or a fall with injury in the past year? Yes   Prior Activity Level Limited Community (1-2x/wk): Mod I with RW; kyphotic; asisted in and out of shower with wife asisst with dressing and drying (Lift chair and electric commode lift, lift onto ramp for in/out of home; weakness right leg; needs to constantly move due to pain)   Prior Functional Level Self Care: Did the patient need help bathing, dressing,  using the toilet or eating? Needed some help   Indoor Mobility: Did the patient need assistance with walking from room to room (with or without device)? Independent   Stairs: Did the patient need assistance with internal or external stairs (with or without device)? Needed some help   Functional Cognition: Did the patient need help planning regular tasks such as shopping or remembering to take medications? Independent   Patient Information Are you of Hispanic, Latino/a,or Spanish origin?: A. No, not of Hispanic, Latino/a, or Spanish origin What is your race?: A. White Do you need or want an interpreter  to communicate with a doctor or health care staff?: 0. No   Patient's Response To:  Health Literacy and Transportation Is the patient able to respond to health literacy and transportation needs?: Yes Health Literacy - How often do you need to have someone help you when you read instructions, pamphlets, or other written material from your doctor or pharmacy?: Never In the past 12 months, has lack of transportation kept you from medical appointments or from getting medications?: No In the past 12 months, has lack of transportation kept you from meetings, work, or from getting things needed for daily living?: No   Home Assistive Devices / [place] Devices/Equipment: Environmental consultant (specify type), Eyeglasses, Cane (specify quad or straight) (reading glasses, muscular dystrophy wheel chair that can't use, lift chair, electric commode raiser, medical lift) Home Equipment: Conservation officer, nature (2 wheels), Toilet riser, Shower seat, Grab bars - tub/shower, Wheelchair - manual   Prior Device Use: Indicate devices/aids used by the patient prior to current illness, exacerbation or injury? Walker   Current Functional Level Cognition   Overall Cognitive Status: Within Functional Limits for tasks assessed Orientation Level: Oriented X4 General Comments: very motivated and eager to participate in  therapy. Wife present throughout and encouraging and great advocate fo rpt.    Extremity Assessment (includes Sensation/Coordination)   Upper Extremity Assessment: RUE deficits/detail, LUE deficits/detail RUE Deficits / Details: grip 5/5, wrist 5/5, elbow 3/5, shoulder elevation not functional RUE Coordination: decreased gross motor LUE Deficits / Details: grip 5/5 , elbow 3/5, shoulder elevation not functional LUE Coordination: decreased gross motor  Lower Extremity Assessment: Defer to PT evaluation RLE Deficits / Details: absent ankle DF (baseline), knee ext +3/5, hip flexion -3/5 RLE Coordination: decreased gross motor LLE Deficits / Details: absent ankle DF, knee ext -4/5, hip flexion +3/5 LLE Sensation: WNL LLE Coordination: decreased gross motor     ADLs   Overall ADL's : Needs assistance/impaired Eating/Feeding: Set up, Bed level Grooming: Wash/dry hands, Wash/dry face, Oral care, Applying deodorant, Brushing hair, Moderate assistance, Bed level Upper Body Bathing: Maximal assistance, Sitting Lower Body Bathing: Maximal assistance, Sitting/lateral leans Upper Body Dressing : Maximal assistance, Sitting Lower Body Dressing: Sitting/lateral leans, Total assistance Toilet Transfer: Maximal assistance, +2 for physical assistance, BSC/3in1 Toilet Transfer Details (indicate cue type and reason): max x 2 to power up from [place] and Hygiene: Total assistance, Sit to/from stand Functional mobility during ADLs: +2 for physical assistance, +2 for safety/equipment, Maximal assistance General ADL Comments: Needs max assist for bed mobility. Needs significantly elevated bed height to perform sit to stand with walker. Able to take steps with min x 2 in room. +2 for safety at all times.     Mobility   Overal bed mobility: Needs Assistance Bed Mobility: Supine to Sit Rolling: Min assist Sidelying to sit: +2 for physical assistance, Max assist Supine to sit: Min  assist Sit to supine: Mod assist, +2 for physical assistance General bed mobility comments: assist to guide LEs off bed, some use of bed rail, cues for log roll technique. Pt's wife able to demonstrate proper guarding technique for bed mobility.     Transfers   Overall transfer level: Needs assistance Equipment used: Rolling walker (2 wheels) Transfers: Sit to/from Stand Sit to Stand: Min assist General transfer comment: has to stand from significantly elevated surface due to his MD.  Pt unable to tolerate sitting in regular height recliner/chair. wife reports he cannot sit for longer than ~52mn. and required +2  assist from standard and lower surfaces     Ambulation / Gait / Stairs / Wheelchair Mobility   Ambulation/Gait Ambulation/Gait assistance: Min assist, Mod assist Gait Distance (Feet): 85 Feet Assistive device: Rolling walker (2 wheels) Gait Pattern/deviations: Decreased dorsiflexion - left, Step-through pattern, Decreased stride length, Trunk flexed, Wide base of support, Step-to pattern General Gait Details: pt was able to tolerate slightly increased distance; required  x5 standing rest breaks leaning on wall for support.  foot drop on Left, hip hikes to compensate.  Trunk  is forward flex approx 45degrees and up to 90 degrees when fatigued. Gait velocity: decreased     Posture / Balance Balance Overall balance assessment: History of Falls, Needs assistance Sitting-balance support: Feet supported Sitting balance-Leahy Scale: Fair Postural control: Posterior lean Standing balance support: Bilateral upper extremity supported, Reliant on assistive device for balance, During functional activity Standing balance-Leahy Scale: Poor Standing balance comment: Reliant on walker or leaning on bed/wall     Special needs/care consideration Fall precautions    Previous Home Environment  Living Arrangements: Spouse/significant other  Lives With: Spouse Available Help at Discharge:  Family, Available 24 hours/day Type of Home: House Home Layout: Two level Alternate Level Stairs-Number of Steps: stair lift Home Access: Level entry, Ramped entrance (with lift) Bathroom Shower/Tub: Multimedia programmer: Standard (Music therapist) Bathroom Accessibility: Yes Home Care Services:  (receiving OP therapy) Type of Home Care Services: Other (Comment) [place] (if known): palliative care person come once every 3 to 4 months Additional Comments: electric commode lift, no other falls in past 6 months, lift chair   Discharge Living Setting Plans for Discharge Living Setting: Lives with (comment) (wife) Type of Home at Discharge: House Discharge Home Layout: Two level Alternate Level Stairs-Rails:  (lift chair) Discharge Home Access: Ramped entrance (with lift in entry) Discharge Bathroom Shower/Tub: Walk-in shower Discharge Bathroom Toilet: Standard (electric commode lift) Discharge Bathroom Accessibility: Yes How Accessible: Accessible via walker Does the patient have any problems obtaining your medications?: No   Social/Family/Support Systems Patient Roles: Spouse Contact Information: wife, Katharine Look Anticipated Caregiver: wife Anticipated Ambulance person Information: see contacts Ability/Limitations of Caregiver: no limitations Caregiver Availability: 24/7 Discharge Plan Discussed with Primary Caregiver: Yes Is Caregiver In Agreement with Plan?: Yes Does Caregiver/Family have Issues with Lodging/Transportation while Pt is in Rehab?: No   Goals Patient/Family Goal for Rehab: supervision to min asisst with PT and OT Expected length of stay: ELOS 10 to 14 days Pt/Family Agrees to Admission and willing to participate: Yes Program Orientation Provided & Reviewed with Pt/Caregiver Including Roles  & Responsibilities: Yes   Decrease burden of Care through IP rehab admission: n/a   Possible need for SNF placement upon discharge: not  anticipated   Patient Condition: I have reviewed medical records from [facility], spoken with  patient and spouse. I discussed via phone for inpatient rehabilitation assessment.  Patient will benefit from ongoing PT and OT, can actively participate in 3 hours of therapy a day 5 days of the week, and can make measurable gains during the admission.  Patient will also benefit from the coordinated team approach during an Inpatient Acute Rehabilitation admission.  The patient will receive intensive therapy as well as Rehabilitation physician, nursing, social worker, and care management interventions.  Due to bladder management, bowel management, safety, skin/wound care, disease management, medication administration, pain management, and patient education the patient requires 24 hour a day rehabilitation nursing.  The patient is currently min to mod  assist overall with mobility and basic ADLs.  Discharge setting and therapy post discharge at home with outpatient is anticipated.  Patient has agreed to participate in the Acute Inpatient Rehabilitation Program and will admit today.   Preadmission Screen Completed By:  Cleatrice Burke, 06/04/2022 10:24 AM ______________________________________________________________________   Discussed status with Dr. Dagoberto Ligas on 06/04/22 at 1024 and received approval for admission today.   Admission Coordinator:  Cleatrice Burke, RN, time 1024 Date 06/04/22    Assessment/Plan: Diagnosis: Does the need for close, 24 hr/day Medical supervision in concert with the patient's rehab needs make it unreasonable for this patient to be served in a less intensive setting? Yes Co-Morbidities requiring supervision/potential complications: FSHD, L3 compression fx s/p kyphoplasty, Parkinson's disease, low plts Due to bladder management, bowel management, safety, skin/wound care, disease management, medication administration, pain management, and patient education, does  the patient require 24 hr/day rehab nursing? Yes Does the patient require coordinated care of a physician, rehab nurse, PT, OT, and SLP to address physical and functional deficits in the context of the above medical diagnosis(es)? Yes Addressing deficits in the following areas: balance, endurance, locomotion, strength, transferring, bowel/bladder control, bathing, dressing, feeding, grooming, and toileting Can the patient actively participate in an intensive therapy program of at least 3 hrs of therapy 5 days a week? Yes The potential for patient to make measurable gains while on inpatient rehab is good Anticipated functional outcomes upon discharge from inpatient rehab: supervision and min assist PT, supervision and min assist OT, n/a SLP Estimated rehab length of stay to reach the above functional goals is: 10-14 days Anticipated discharge destination: Home 10. Overall Rehab/Functional Prognosis: good     MD Signature:           Revision History                           Note Details  Jan Fireman, MD File Time 06/04/2022 10:42 AM  Author Type Physician Status Signed  Last Editor Courtney Heys, [place] # [id number] [place] Date 06/04/2022

## 2022-06-05 DIAGNOSIS — M792 Neuralgia and neuritis, unspecified: Secondary | ICD-10-CM | POA: Diagnosis not present

## 2022-06-05 DIAGNOSIS — S32030D Wedge compression fracture of third lumbar vertebra, subsequent encounter for fracture with routine healing: Secondary | ICD-10-CM

## 2022-06-05 DIAGNOSIS — K5900 Constipation, unspecified: Secondary | ICD-10-CM | POA: Diagnosis not present

## 2022-06-05 DIAGNOSIS — R770 Abnormality of albumin: Secondary | ICD-10-CM | POA: Diagnosis not present

## 2022-06-05 LAB — CBC WITH DIFFERENTIAL/PLATELET
Abs Immature Granulocytes: 0.03 10*3/uL (ref 0.00–0.07)
Basophils Absolute: 0 10*3/uL (ref 0.0–0.1)
Basophils Relative: 0 %
Eosinophils Absolute: 0 10*3/uL (ref 0.0–0.5)
Eosinophils Relative: 1 %
HCT: 41.7 % (ref 39.0–52.0)
Hemoglobin: 13.6 g/dL (ref 13.0–17.0)
Immature Granulocytes: 1 %
Lymphocytes Relative: 24 %
Lymphs Abs: 1.1 10*3/uL (ref 0.7–4.0)
MCH: 31.6 pg (ref 26.0–34.0)
MCHC: 32.6 g/dL (ref 30.0–36.0)
MCV: 97 fL (ref 80.0–100.0)
Monocytes Absolute: 0.5 10*3/uL (ref 0.1–1.0)
Monocytes Relative: 11 %
Neutro Abs: 2.9 10*3/uL (ref 1.7–7.7)
Neutrophils Relative %: 63 %
Platelets: 242 10*3/uL (ref 150–400)
RBC: 4.3 MIL/uL (ref 4.22–5.81)
RDW: 12.8 % (ref 11.5–15.5)
WBC: 4.5 10*3/uL (ref 4.0–10.5)
nRBC: 0 % (ref 0.0–0.2)

## 2022-06-05 LAB — COMPREHENSIVE METABOLIC PANEL
ALT: 9 U/L (ref 0–44)
AST: 20 U/L (ref 15–41)
Albumin: 3 g/dL — ABNORMAL LOW (ref 3.5–5.0)
Alkaline Phosphatase: 120 U/L (ref 38–126)
Anion gap: 9 (ref 5–15)
BUN: 8 mg/dL (ref 8–23)
CO2: 26 mmol/L (ref 22–32)
Calcium: 9.1 mg/dL (ref 8.9–10.3)
Chloride: 102 mmol/L (ref 98–111)
Creatinine, Ser: 0.36 mg/dL — ABNORMAL LOW (ref 0.61–1.24)
GFR, Estimated: >60 mL/min (ref >60)
Glucose, Bld: 115 mg/dL — ABNORMAL HIGH (ref 70–99)
Potassium: 3.9 mmol/L (ref 3.5–5.1)
Sodium: 137 mmol/L (ref 135–145)
Total Bilirubin: 0.4 mg/dL (ref 0.3–1.2)
Total Protein: 5.8 g/dL — ABNORMAL LOW (ref 6.5–8.1)

## 2022-06-05 MED ORDER — GABAPENTIN 100 MG PO CAPS
100.0000 mg | ORAL_CAPSULE | Freq: Three times a day (TID) | ORAL | Status: DC
  Administered 2022-06-05 – 2022-06-12 (×21): 100 mg via ORAL
  Filled 2022-06-05 (×21): qty 1

## 2022-06-05 NOTE — Progress Notes (Signed)
Inpatient Rehabilitation Care Coordinator Assessment and Plan Patient Details  Name: Luis [name] [name]. MRN: [id number] Date of Birth: 1955-11-16  Today's Date: 06/05/2022  Hospital Problems: Principal Problem:   Lumbar compression fracture [facility])  Past Medical History:  Past Medical History:  Diagnosis Date   Bilateral foot-drop 10/19/2018   Bursitis, trochanteric    Episodic   Carpal tunnel syndrome on right    Degenerative disk disease    l5-S1   FSH (facioscapulohumeral muscular dystrophy) ([place]) 10/07/2017   Gait abnormality 10/19/2018   GERD (gastroesophageal reflux disease)    Hemorrhoids    with anal fissures   Left lateral epicondylitis    Parkinson's disease 10/19/2018   Skin cancer    right temple area   Past Surgical History:  Past Surgical History:  Procedure Laterality Date   CHOLECYSTECTOMY  2007   HEMORRHOIDECTOMY WITH HEMORRHOID BANDING     IR KYPHO LUMBAR INC FX REDUCE BONE BX UNI/BIL CANNULATION INC/IMAGING  01/10/2020   IR KYPHO LUMBAR INC FX REDUCE BONE BX UNI/BIL CANNULATION INC/IMAGING  05/31/2022   Social History:  reports that he has never smoked. He has never used smokeless tobacco. He reports that he does not drink alcohol and does not use drugs.  Family / Support Systems Marital Status: Married Patient Roles: Spouse Spouse/Significant Other: Katharine Look [phone]-home  [phone] Other Supports: Friends and church members Anticipated Caregiver: wife Ability/Limitations of Caregiver: helathy Caregiver Availability: 24/7 Family Dynamics: Close knit with church members and friends pt has decided to limit their visiting while here due to being tired from therapies. Wife wants him to focus on this and can see them after going home  Social History Preferred language: English Religion: [employer] Cultural Background: No issues Education: Medical sales representative - How often do you need to have someone help you when you read instructions, pamphlets, or  other written material from your doctor or pharmacy?: Never Writes: Yes Employment Status: Retired Public relations account executive Issues: No issues Guardian/Conservator: None-according to MD pt is capable of making his own decisions while here. Wife plans to be here daily and provide support   Abuse/Neglect Abuse/Neglect Assessment Can Be Completed: Yes Physical Abuse: Denies Verbal Abuse: Denies Sexual Abuse: Denies Exploitation of patient/patient's resources: Denies Self-Neglect: Denies  Patient response to: Social Isolation - How often do you feel lonely or isolated from those around you?: Never  Emotional Status Pt's affect, behavior and adjustment status: Pt is motivated to do well and get back to being mobile, glad the surgery is over but now is time to push self and recover. Pt and wife look at this as a team approach and will work together on his recovery. Recent Psychosocial Issues: other health issues Psychiatric History: No history may benefit from seeing neuro-psych while here for coping Substance Abuse History: No issues  Patient / Family Perceptions, Expectations & Goals Pt/Family understanding of illness & functional limitations: Pt and wife can explain his surgery and parkinson's he does his best and hopeful will do well here. Both talk with the MD and feel they have a good understanding of his treatment plan moving forward. Premorbid pt/family roles/activities: husband, retiree, nieghbor, church member, etc Anticipated changes in roles/activities/participation: resume Pt/family expectations/goals: Pt states: " I wil do my best I have high hopes."  Wife states: " I know together we will do well and accomplish much, so gald to be here."  [employer]: None Premorbid Home Care/DME Agencies: Other (Comment) (power chair, toliet lift and lift chair)  Transportation available at discharge: wife Is the patient able to respond to transportation needs?:  Yes In the past 12 months, has lack of transportation kept you from medical appointments or from getting medications?: No In the past 12 months, has lack of transportation kept you from meetings, work, or from getting things needed for daily living?: No Resource referrals recommended: Neuropsychology  Discharge Planning Living Arrangements: Spouse/significant other Support Systems: Spouse/significant other, Friends/neighbors, Social worker community Type of Residence: Private residence Insurance Resources: [employer], Multimedia programmer (specify) Scientist, clinical (histocompatibility and immunogenetics)) Financial Resources: Fish farm manager, Other (Comment) Financial Screen Referred: No Living Expenses: Own Money Management: Patient, Spouse Does the patient have any problems obtaining your medications?: No Home Management: wife Patient/Family Preliminary Plans: Return home with wife who is able to assist him if needed. Aware being evaluated today and goals being set for his stay here. Wife here to provide support and see his level Care Coordinator Anticipated Follow Up Needs: HH/OP  Clinical Impression Pleasant couple wife tends to talk for the both of them. Pt does talk but wife reports more introverted then her. Aware team evaluating today and setting goals and team conference on Tuesday. Will work on discharge needs. Pt placed on neruo-psych list  Elease Hashimoto 06/05/2022, 10:29 AM

## 2022-06-05 NOTE — Progress Notes (Signed)
Orthopedic Tech Progress Note Patient Details:  Luis [name]. June 05, 1956 [id number]  Called in order to [facility] for BLE PRAFOs   Patient ID: Luis Hamblin Lizotte [name]., male   DOB: 02-26-1956, 66 y.o.   MRN: [id number]  Luis [name] 06/05/2022, 4:35 PM

## 2022-06-05 NOTE — Progress Notes (Signed)
[facility] Individual Statement of Services  Patient Name:  Luis [name] [name].  Date:  06/05/2022  Welcome to the [facility].  Our goal is to provide you with an individualized program based on your diagnosis and situation, designed to meet your specific needs.  With this comprehensive rehabilitation program, you will be expected to participate in at least 3 hours of rehabilitation therapies Monday-Friday, with modified therapy programming on the weekends.  Your rehabilitation program will include the following services:  Physical Therapy (PT), Occupational Therapy (OT), 24 hour per day rehabilitation nursing, Neuropsychology, Care Coordinator, Rehabilitation Medicine, Nutrition Services, and Pharmacy Services  Weekly team conferences will be held on Tuesday to discuss your progress.  Your Inpatient Rehabilitation Care Coordinator will talk with you frequently to get your input and to update you on team discussions.  Team conferences with you and your family in attendance may also be held.  Expected length of stay: 5-7 days  Overall anticipated outcome: independent-supervision level  Depending on your progress and recovery, your program may change. Your Inpatient Rehabilitation Care Coordinator will coordinate services and will keep you informed of any changes. Your Inpatient Rehabilitation Care Coordinator's name and contact numbers are listed  below.  The following services may also be recommended but are not provided by the [facility]:   [place] will be made to provide these services after discharge if needed.  Arrangements include referral to agencies that provide these services.  Your insurance has been verified to be:  Medicare & Aetna Your primary doctor is:  [name]  Pertinent information will be shared with your doctor and your insurance  company.  Inpatient Rehabilitation Care Coordinator:  Ovidio Kin, [place] or Emilia Beck  Information discussed with and copy given to patient by: Elease Hashimoto, 06/05/2022, 10:31 AM

## 2022-06-05 NOTE — Progress Notes (Signed)
PROGRESS NOTE   Subjective/Complaints: Reports shooting pain R thigh, reports gabapentin helped in the past. Wife in room. He is happy to be at [employer].   ROS: denies CP,SOB, N/V + back pain-chronic, improves with standing +tremors Objective:   No results found. Recent Labs    06/04/22 1407 06/05/22 0714  WBC 5.6 4.5  HGB 14.7 13.6  HCT 43.1 41.7  PLT 282 242   Recent Labs    06/04/22 1407 06/05/22 0714  NA  --  137  K  --  3.9  CL  --  102  CO2  --  26  GLUCOSE  --  115*  BUN  --  8  CREATININE 0.44* 0.36*  CALCIUM  --  9.1    Intake/Output Summary (Last 24 hours) at 06/05/2022 1412 Last data filed at 06/05/2022 0243 Gross per 24 hour  Intake --  Output 825 ml  Net -825 ml        Physical Exam: Vital Signs Blood pressure 105/68, pulse 66, temperature 97.6 F (36.4 C), resp. rate 17, height '5\' 11"'$  (1.803 m), weight 71.1 kg, SpO2 97 %.   General: Alert and oriented x 3, No apparent distress, wife at bedside HEENT: Head is normocephalic, atraumatic, PERRLA, EOMI, sclera anicteric, oral mucosa pink and moist Neck: Supple without JVD or lymphadenopathy Heart: Reg rate and rhythm. No murmurs rubs or gallops Chest: CTA bilaterally without wheezes, rales, or rhonchi; no distress Abdomen: Soft, non-tender, non-distended, bowel sounds positive. Extremities: No clubbing, cyanosis, or edema. Pulses are 2+ Psych: A little flat Skin: Clean and intact without signs of breakdown Bandages over L spine C/D/I Musculoskeletal:     Cervical back: Neck supple. No tenderness.     Comments: RUE- deltoid 2/5; Biceps 2-/5; Triceps 2+/5; WE 5-/5; Grip 4+/5; FA 4-/5 LUE- same except FA 4+/5 RLE- HF 4-/5; KE 4+/5' DF 0/5; PF 4+/5 LLE- same except HF 4/5 Muscle atrophy in shoulder and hip girdles Adquate core strength No TTP across low back around compression fx   Neurological: Follows commands, RUE tremor related to  PD  Assessment/Plan: 1. Functional deficits which require 3+ hours per day of interdisciplinary therapy in a comprehensive inpatient rehab setting. Physiatrist is providing close team supervision and 24 hour management of active medical problems listed below. Physiatrist and rehab team continue to assess barriers to discharge/monitor patient progress toward functional and medical goals  Care Tool:  Bathing    Body parts bathed by patient: Right arm, Abdomen, Front perineal area, Right upper leg, Left upper leg, Face   Body parts bathed by helper: Left arm, Chest, Buttocks, Left lower leg, Right lower leg     Bathing assist Assist Level: Maximal Assistance - Patient 24 - 49%     Upper Body Dressing/Undressing Upper body dressing   What is the patient wearing?: Pull over shirt    Upper body assist Assist Level: Moderate Assistance - Patient 50 - 74%    Lower Body Dressing/Undressing Lower body dressing      What is the patient wearing?: Underwear/pull up, Pants     Lower body assist Assist for lower body dressing: 2 Helpers     Toileting  Toileting    Toileting assist Assist for toileting: 2 Helpers     Transfers Chair/bed transfer  Transfers assist  Chair/bed transfer activity did not occur: Safety/medical concerns  Chair/bed transfer assist level: Minimal Assistance - Patient > 75%     Locomotion Ambulation   Ambulation assist      Assist level: Minimal Assistance - Patient > 75% Assistive device: Walker-rolling Max distance: 100'   Walk 10 feet activity   Assist     Assist level: Minimal Assistance - Patient > 75% Assistive device: Walker-rolling   Walk 50 feet activity   Assist    Assist level: Minimal Assistance - Patient > 75% Assistive device: Walker-rolling    Walk 150 feet activity   Assist Walk 150 feet activity did not occur: Safety/medical concerns (Unable to ambulate >100' at this time secondary to impaired  endurance/activity tolerance.)         Walk 10 feet on uneven surface  activity   Assist Walk 10 feet on uneven surfaces activity did not occur: Safety/medical concerns (Patient has a mehanical lift chair at P[facility]- Did not perform stair mobility, ramp, or ambulate over uneven surfaces prior to admission.)         Wheelchair     Assist Is the patient using a wheelchair?: Yes Type of Wheelchair: Manual (Patient has power wheelchair at home, however is only used in the community and not within the home.)    Wheelchair assist level: Dependent - Patient 0% Max wheelchair distance: Therapist propelled manual wheelchair for time management.    Wheelchair 50 feet with 2 turns activity    Assist        Assist Level: Dependent - Patient 0%   Wheelchair 150 feet activity     Assist      Assist Level: Dependent - Patient 0%   Blood pressure 105/68, pulse 66, temperature 97.6 F (36.4 C), resp. rate 17, height '5\' 11"'$  (1.803 m), weight 71.1 kg, SpO2 97 %.  Medical Problem List and Plan: 1. Functional deficits secondary to L3 compression fracture.  Status post kyphoplasty 9/29.  Back brace when out of bed.  May remove brace to shower.             -patient may  shower             -ELOS/Goals: 10-14 days- supervision to min A due to FSHD  -Continue CIR  -PRAFO ordered for night use bilateral 2.  Antithrombotics: -DVT/anticoagulation:  Pharmaceutical: Heparin             -antiplatelet therapy: n/a 3. Pain Management/history of chronic severe neuropathic pain: Cymbalta 30 mg twice daily, Dilaudid 8 mg every 6 hours as needed- will change dilaudid to 8 mg QID so doesn't have to keep asking for it- takes scheduled at home.   -10/4 Start gabapentin '100mg'$  TID 4. Mood/Behavior/Sleep: Valium 10 mg twice daily as needed anxiety             -antipsychotic agents: N/A 5. Neuropsych/cognition: This patient is capable of making decisions on his own behalf. 6. Skin/Wound Care:  Routine skin checks 7. Fluids/Electrolytes/Nutrition: Routine in and outs with follow-up chemistries 8.  Parkinson's disease.  Sinemet 50-200 mg 3 times daily 9.  History of FSHD muscular dystrophy.  Follow-up [facility] Dr. Tillman Abide 10.  Constipation.  Colace 100 mg nightly, MiraLAX daily, Chronulac as needed  -LBM 10/4 improved 11. FSHD- can use his home trilegy for breathing issues due to Muscular dystrophy 12. Low  albumin  -appears to be eating ok, continue ensure      LOS: 1 days A FACE TO FACE EVALUATION WAS PERFORMED  Jennye Boroughs 06/05/2022, 2:12 PM

## 2022-06-05 NOTE — Progress Notes (Signed)
Pt assisted with his home machine.

## 2022-06-05 NOTE — Progress Notes (Signed)
Inpatient Rehabilitation  Patient information reviewed and entered into eRehab system by Odena Mcquaid Kamaiya Antilla, OTR/L, Rehab Quality Coordinator.   Information including medical coding, functional ability and quality indicators will be reviewed and updated through discharge.   

## 2022-06-05 NOTE — Patient Care Conference (Signed)
Inpatient RehabilitationTeam Conference and Plan of Care Update Date: 06/05/2022   Time: 12:17 PM    Patient Name: Luis [name].      Medical Record Number: [id number]  Date of Birth: 02/15/1956 Sex: Male         Room/Bed: 4W26C/4W26C-01 Payor Info: Payor: MEDICARE / Plan: MEDICARE PART A AND B / Product Type: *No Product type* /    Admit Date/Time:  06/04/2022  1:22 PM  Primary Diagnosis:  Lumbar compression fracture [facility])  Hospital Problems: Principal Problem:   Lumbar compression fracture ([place]) Active Problems:   Low serum albumin    Expected Discharge Date: Expected Discharge Date: 06/12/22  Team Members Present: Physician leading conference: Dr. Jennye Boroughs Social Worker Present: Ovidio Kin, LCSW Nurse Present: Dorien Chihuahua, RN PT Present: Other (comment) Jodi Mourning Rafoth, PT) OT Present: Laverle Hobby, OT SLP Present: Charolett Bumpers, SLP PPS Coordinator present : Ileana Ladd, PT     Current Status/Progress Goal Weekly Team Focus  Bowel/Bladder   Continent of b/b; LBM: 10/3  Offer/Assist w/ toileting q 2-4 hours and as needed.  Assess toileting needs q shift & PRN   Swallow/Nutrition/ Hydration             ADL's   Mod-max A ADLs, lots of compensatory movements, min A functional mobility  Caregiver instructions, (S) mobility, mod A ADLs  ADLs, compensatory   Mobility   ModA for bed mobility with bed rails (which patient owns at [facility]); CGA/MinA for sit/stands, transfers, gait up to 100' with RW; MaxA for standing from low surfaces (which patient did not do at [facility]).  Supv/ModI with RW  Endurance, B LE, strengthening, family education, compensatory strategies.   Communication             Safety/Cognition/ Behavioral Observations            Pain   Pt complained of back pain rating it a 5/10 describing it as aching & discomfort. Scehduled pain medication was given.  Keep pain level <3  Assess pain q shift & PRN   Skin   Incision to back - gauze in place   Remain free from any new skin breakdowns and skin issues.  Assess skin q shift & PRN     Discharge Planning:  New evaluation plan for home with wife was mobile prior to admission-hopeful will get back to this level at DC   Team Discussion: Patient post lumbar compression fracture with hx of parkinson's and MD. Pain managed per MD.  Patient on target to meet rehab goals: yes, patient has a premorbid retracted scapular positioning and used compensatory strategies PTA with new weakness. Able to ambulate 100' using a RW with CGA.  Will need help for bed mobility. Stands/props most of the day; sits occassionally but limited due to pain/discomfort. Ambulates using a walker in the home.   *See Care Plan and progress notes for long and short-term goals.   Revisions to Treatment Plan:  N/a  Teaching Needs: Safety, transfers, toileting, etc.  Current Barriers to Discharge: Decreased caregiver support  Possible Resolutions to Barriers: Family education     Medical Summary Current Status: LL3 compression fracture kyphoplasty 9/29., Parkinsons, muscular dystrophy, constipation, FSHD  Barriers to Discharge: Home enviroment access/layout;Medical stability  Barriers to Discharge Comments: LL3 compression fracture kyphoplasty 9/29., Parkinsons, muscular dystrophy, constipation, FSHD, pain Possible Resolutions to [employer] Focus: start gabapentin for pain   Continued Need for Acute Rehabilitation Level of Care: The patient requires daily medical management  by a physician with specialized training in physical medicine and rehabilitation for the following reasons: Direction of a multidisciplinary physical rehabilitation program to maximize functional independence : Yes Medical management of patient stability for increased activity during participation in an intensive rehabilitation regime.: Yes Analysis of laboratory values and/or radiology reports with any subsequent need for medication  adjustment and/or medical intervention. : Yes   I attest that I was present, lead the team conference, and concur with the assessment and plan of the team.   Dorien Chihuahua B 06/05/2022, 5:19 PM

## 2022-06-05 NOTE — Plan of Care (Signed)
  Problem: Sit to Stand Goal: LTG:  Patient will perform sit to stand with assistance level (PT) Description: LTG:  Patient will perform sit to stand with assistance level (PT) Flowsheets (Taken 06/05/2022 1321) LTG: PT will perform sit to stand in preparation for functional mobility with assistance level: Independent with assistive device   Problem: RH Bed Mobility Goal: LTG Patient will perform bed mobility with assist (PT) Description: LTG: Patient will perform bed mobility with assistance, with/without cues (PT). Flowsheets (Taken 06/05/2022 1321) LTG: Pt will perform bed mobility with assistance level of: Minimal Assistance - Patient > 75% Note: Patient will be able to transition from supine to sitting EOB ModI, however require MinA for transitioning from sitting EOB to supine.    Problem: RH Bed to Chair Transfers Goal: LTG Patient will perform bed/chair transfers w/assist (PT) Description: LTG: Patient will perform bed to chair transfers with assistance (PT). Flowsheets (Taken 06/05/2022 1321) LTG: Pt will perform Bed to Chair Transfers with assistance level: Independent with assistive device    Problem: RH Car Transfers Goal: LTG Patient will perform car transfers with assist (PT) Description: LTG: Patient will perform car transfers with assistance (PT). Flowsheets (Taken 06/05/2022 1321) LTG: Pt will perform car transfers with assist:: Supervision/Verbal cueing   Problem: RH Ambulation Goal: LTG Patient will ambulate in home environment (PT) Description: LTG: Patient will ambulate in home environment, # of feet with assistance (PT). Flowsheets (Taken 06/05/2022 1321) LTG: Pt will ambulate in home environ  assist needed:: Independent with assistive device LTG: Ambulation distance in home environment: 50' Goal: LTG Patient will ambulate in community environment (PT) Description: LTG: Patient will ambulate in community environment, # of feet with assistance (PT). Flowsheets (Taken  06/05/2022 1321) LTG: Pt will ambulate in community environ  assist needed:: Supervision/Verbal cueing LTG: Ambulation distance in community environment: 150'

## 2022-06-05 NOTE — Evaluation (Signed)
Occupational Therapy Assessment and Plan  Patient Details  Name: Luis [name]. MRN: [id number] Date of Birth: 05/18/1956  OT Diagnosis: abnormal posture, acute pain, and muscle weakness (generalized) Rehab Potential: Rehab Potential (ACUTE ONLY): Good ELOS: 5-7 days   Today's Date: 06/05/2022 OT Individual Time: 9735-3299 OT Individual Time Calculation (min): 85 min     Hospital Problem: Principal Problem:   Lumbar compression fracture [facility])   Past Medical History:  Past Medical History:  Diagnosis Date   Bilateral foot-drop 10/19/2018   Bursitis, trochanteric    Episodic   Carpal tunnel syndrome on right    Degenerative disk disease    l5-S1   FSH (facioscapulohumeral muscular dystrophy) ([place]) 10/07/2017   Gait abnormality 10/19/2018   GERD (gastroesophageal reflux disease)    Hemorrhoids    with anal fissures   Left lateral epicondylitis    Parkinson's disease 10/19/2018   Skin cancer    right temple area   Past Surgical History:  Past Surgical History:  Procedure Laterality Date   CHOLECYSTECTOMY  2007   HEMORRHOIDECTOMY WITH HEMORRHOID BANDING     IR KYPHO LUMBAR INC FX REDUCE BONE BX UNI/BIL CANNULATION INC/IMAGING  01/10/2020   IR KYPHO LUMBAR INC FX REDUCE BONE BX UNI/BIL CANNULATION INC/IMAGING  05/31/2022    Assessment & Plan Clinical Impression: Luis [name] is a 66 year old right-handed male with with history of Parkinson's disease maintained on Sinemet, chronic severe neuropathic pain, FSHD muscular dystrophy followed by Dr. [name].  Per chart review patient lives with spouse.  Uses a rolling walker for mobility.  He had been wearing AFOs for bilateral foot drop but did not feel they were effective.  Presented 05/27/2022 after mechanical fall with increased pain in the lumbar region.  He does have a history of prior kyphoplasty at L5 01/10/2020.  MRI images revealed acute compression fracture of L3 with approximately 40% vertebral body  height loss.  Unchanged compression deformity of L5.  Underwent L3 balloon kyphoplasty 05/31/2022 per interventional radiology Dr.De Sindy Messing.  He was ordered a thoracolumbosacral orthotic brace but had trouble tolerating the brace.  He remains on Sinemet as prior to admission for history of Parkinson's disease.  Placed on subcutaneous heparin for DVT prophylaxis.  Maintained on Dilaudid for pain control at home due to painful Muscular dystrophy.. Therapy evaluations completed due to patient decreased functional mobility was admitted for a comprehensive rehab program. Patient transferred to CIR on 06/04/2022 .    Patient currently requires max with basic self-care skills secondary to muscle weakness and muscle joint tightness, decreased cardiorespiratoy endurance, abnormal tone, unbalanced muscle activation, and decreased coordination, delayed processing, and decreased standing balance, decreased postural control, and decreased balance strategies.  Prior to hospitalization, patient could complete ADLs with mod.  Patient will benefit from skilled intervention to increase independence with basic self-care skills prior to discharge home with care partner.  Anticipate patient will require 24 hour supervision and minimal physical assistance and follow up home health.  OT - End of Session Activity Tolerance: Tolerates < 10 min activity, no significant change in vital signs Endurance Deficit: Yes Endurance Deficit Description: generalized weakness OT Assessment Rehab Potential (ACUTE ONLY): Good OT Patient demonstrates impairments in the following area(s): Balance;Motor;Safety;Pain;Endurance OT Basic ADL's Functional Problem(s): Bathing;Dressing;Toileting OT Transfers Functional Problem(s): Toilet OT Additional Impairment(s): None OT Plan OT Intensity: Minimum of 1-2 x/day, 45 to 90 minutes OT Frequency: 5 out of 7 days OT Duration/Estimated Length of Stay: 5-7 days OT Treatment/Interventions:  Balance/vestibular training;Self Care/advanced ADL retraining;Therapeutic Exercise;DME/adaptive equipment instruction;Pain management;Skin care/wound managment;UE/LE Strength taining/ROM;UE/LE Coordination activities;Patient/family education;Therapeutic Activities;Functional mobility training OT Self Feeding Anticipated Outcome(s): (S) OT Basic Self-Care Anticipated Outcome(s): caregiver assisting- mod A OT Toileting Anticipated Outcome(s): max A OT Bathroom Transfers Anticipated Outcome(s): (S) OT Recommendation Patient destination: Home Follow Up Recommendations: Home health OT Equipment Recommended: None recommended by OT   OT Evaluation Precautions/Restrictions  Precautions Precautions: Back Precaution Comments: Kyphoplasty 05/31/22 Required Braces or Orthoses: Spinal Brace Spinal Brace: Thoracolumbosacral orthotic Restrictions Weight Bearing Restrictions: No General Chart Reviewed: Yes Family/Caregiver Present: Yes  Home Living/Prior [place] expects to be discharged to:: Private residence Living Arrangements: Spouse/significant other Available Help at Discharge: Family, Available 24 hours/day Type of Home: House Home Access: Level entry (Lift to get inside the home) Home Layout: One level Alternate Level Stairs-Number of Steps: Stair lift Bathroom Shower/Tub: Multimedia programmer: Handicapped height Location manager) Bathroom Accessibility: Yes Additional Comments: electric commode lift, no other falls in past 6 months, lift chair  Lives With: Spouse IADL History Homemaking Responsibilities: No Current License: No Prior Function Level of Independence: Requires assistive device for independence, Needs assistance with ADLs, Needs assistance with homemaking, Independent with transfers, Needs assistance with gait  Able to Take Stairs?: No Driving: Yes (Last drove to church and back on September 17th.) Vocation:  Retired Leisure: Hobbies-yes (Comment) (Enjoys being outside) Vision Baseline Vision/History: 0 No visual deficits Ability to See in Adequate Light: 0 Adequate Patient Visual Report: No change from baseline Vision Assessment?: No apparent visual deficits Perception  Perception: Within Functional Limits Praxis Praxis: Intact Cognition Cognition Overall Cognitive Status: Within Functional Limits for tasks assessed Arousal/Alertness: Awake/alert Orientation Level: Person;Place;Situation Person: Oriented Place: Oriented Situation: Oriented Memory: Appears intact Awareness: Appears intact Problem Solving: Appears intact Safety/Judgment: Appears intact Brief Interview for Mental Status (BIMS) Repetition of Three Words (First Attempt): 3 Temporal Orientation: Year: Correct Temporal Orientation: Month: Accurate within 5 days Temporal Orientation: Day: Correct Recall: "Sock": No, could not recall Recall: "Blue": Yes, no cue required Recall: "Bed": Yes, no cue required BIMS Summary Score: 13 Sensation Sensation Light Touch: Appears Intact Hot/Cold: Appears Intact Coordination Gross Motor Movements are Fluid and Coordinated: No Fine Motor Movements are Fluid and Coordinated: No Coordination and Movement Description: 2/2 FSHD muscular dystrophy and parkinsons. Pt and his wife report coordination and movement is near baseline Motor  Motor Motor: Abnormal postural alignment and control Motor - Skilled Clinical Observations: FSHD muscular dystrophy and parkinsons.  Trunk/Postural Assessment  Cervical Assessment Cervical Assessment: Within Functional Limits Thoracic Assessment Thoracic Assessment: Exceptions to [facility] (Kyphotic posture with rounded shoulders) Lumbar Assessment Lumbar Assessment: Exceptions to [facility] (Posterior pelvic tilt) Postural Control Postural Control: Deficits on evaluation Trunk Control: delayed 2/2 FSHD muscular dystrophy and parkinsons   Balance Balance Balance Assessed: Yes Static Sitting Balance Static Sitting - Balance Support: Feet supported Static Sitting - Level of Assistance: 5: Stand by assistance Dynamic Sitting Balance Dynamic Sitting - Balance Support: Feet supported Dynamic Sitting - Level of Assistance: 5: Stand by assistance Static Standing Balance Static Standing - Balance Support: During functional activity;Bilateral upper extremity supported Static Standing - Level of Assistance:  (CGA) Dynamic Standing Balance Dynamic Standing - Balance Support: During functional activity;Bilateral upper extremity supported Dynamic Standing - Level of Assistance: 4: Min assist Dynamic Standing - Comments: Patient has very unique motor compensations during his mobility that he uses to complete functional mobility Extremity/Trunk Assessment RUE Assessment RUE Assessment: Exceptions to [place]  Comments: Pt able to achieve about 70 degress of shoulder flexion with large abduction compensations. Scapulas heavily retracted and winging with functional reaching. Resting tremor also present LUE Assessment LUE Assessment: Exceptions to [facility] Strength Comments: Slightly more range than the RUE and less of a tremor. Pt still limited by scapular musculature atrophy and abduction compensation  Care Tool Care Tool Self Care Eating   Eating Assist Level: Minimal Assistance - Patient > 75%    Oral Care    Oral Care Assist Level: Minimal Assistance - Patient > 75%    Bathing   Body parts bathed by patient: Right arm;Abdomen;Front perineal area;Right upper leg;Left upper leg;Face Body parts bathed by helper: Left arm;Chest;Buttocks;Left lower leg;Right lower leg   Assist Level: Maximal Assistance - Patient 24 - 49%    Upper Body Dressing(including orthotics)   What is the patient wearing?: Pull over shirt   Assist Level: Moderate Assistance - Patient 50 - 74%    Lower Body Dressing (excluding footwear)    What is the patient wearing?: Underwear/pull up;Pants Assist for lower body dressing: 2 Helpers    Putting on/Taking off footwear   What is the patient wearing?: Non-skid slipper socks Assist for footwear: Dependent - Patient 0%       Care Tool Toileting Toileting activity   Assist for toileting: 2 Helpers     Care Tool Bed Mobility Roll left and right activity   Roll left and right assist level: Minimal Assistance - Patient > 75%    Sit to lying activity   Sit to lying assist level: Minimal Assistance - Patient > 75%    Lying to sitting on side of bed activity   Lying to sitting on side of bed assist level: the ability to move from lying on the back to sitting on the side of the bed with no back support.: Minimal Assistance - Patient > 75%     Care Tool Transfers Sit to stand transfer   Sit to stand assist level: Minimal Assistance - Patient > 75%    Chair/bed transfer   Chair/bed transfer assist level: Minimal Assistance - Patient > 75%     Toilet transfer   Assist Level: Minimal Assistance - Patient > 75%     Care Tool Cognition  Expression of Ideas and Wants Expression of Ideas and Wants: 4. Without difficulty (complex and basic) - expresses complex messages without difficulty and with speech that is clear and easy to understand  Understanding Verbal and Non-Verbal Content Understanding Verbal and Non-Verbal Content: 4. Understands (complex and basic) - clear comprehension without cues or repetitions   Memory/Recall Ability Memory/Recall Ability : Current season;That he or she is in a hospital/hospital unit   Refer to Care Plan for [place] 1 OT Short Term Goal 1 (Week 1): STG= LTG d/t ELOS  Recommendations for other services: None    Skilled Therapeutic Intervention ADL ADL Eating: Minimal assistance Where Assessed-Eating: Bed level Grooming: Moderate assistance Where Assessed-Grooming: Sitting at sink Upper Body Bathing:  Moderate assistance Where Assessed-Upper Body Bathing: Sitting at sink Lower Body Bathing: Maximal assistance Where Assessed-Lower Body Bathing: Sitting at sink Upper Body Dressing: Moderate assistance Where Assessed-Upper Body Dressing: Sitting at sink Lower Body Dressing: Dependent Where Assessed-Lower Body Dressing: Sitting at sink Toileting: Maximal assistance Where Assessed-Toileting: Bedside Commode Toilet Transfer: Minimal assistance Toilet Transfer Method: Stand pivot Mobility  Bed Mobility Bed Mobility: Rolling Right;Rolling Left;Supine to Sit;Sit to Supine Rolling Right: Minimal  Assistance - Patient > 75% Rolling Left: Minimal Assistance - Patient > 75% Supine to Sit: Moderate Assistance - Patient 50-74% Sit to Supine: Moderate Assistance - Patient 50-74% Transfers Sit to Stand: Minimal Assistance - Patient > 75% Stand to Sit: Minimal Assistance - Patient > 75%    Skilled OT evaluation completed with the creation of pt centered OT POC. Pt educated on condition, ELOS, rehab expectations, and fall risk reduction strategies throughout session. Extra time spent gathering information re PLOF d/t complex medical history. Pt and his wife refuse the use of the TLSO d/t pain. Consulted PA re pt extremely forward flexed trunk and impact on back precautions. With consideration of all commodities all agree this is the most functional method of him ambulating given this is baseline. Continue with pain management. After extensive talk with pt and his wife goal of OT POC will be strengthening and refining compensatory patterns used previously. Pt completed Adls as described above. Pt's wife checked off for in room transfers as pt prefers to stand for pain management. Pt left sitting EOB with wife present.    Discharge Criteria: Patient will be discharged from OT if patient refuses treatment 3 consecutive times without medical reason, if treatment goals not met, if there is a change in medical  status, if patient makes no progress towards goals or if patient is discharged from hospital.  The above assessment, treatment plan, treatment alternatives and goals were discussed and mutually agreed upon: by patient and by family  Curtis Sites 06/05/2022, 12:17 PM

## 2022-06-05 NOTE — Progress Notes (Signed)
Occupational Therapy Session Note  Patient Details  Name: Luis [name]. MRN: [id number] Date of Birth: 10/27/1955  Today's Date: 06/05/2022 OT Individual Time: 1416-1510 OT Individual Time Calculation (min): 54 min    Short Term Goals: Week 1:  OT Short Term Goal 1 (Week 1): STG= LTG d/t ELOS  Skilled Therapeutic Interventions/Progress Updates:  Pt received sitting EOB with moderate pain (un-rated) in his back but agreeable and eager to complete therapy. Pt completed sit > stand from VERY elevated EOB with CGA using the RW. Pt completed 200 ft of functional mobility with 5 standing rest breaks leaning against the wall. He alternated between walking with BUE in abduction and slightly bent forward with walking fully flexed forward, almost 90 degrees at the hips. This is his baseline. Pt came to the mat and sat EOM unsupported for remainder of session. He completed 3x10 mini squats with mod facilitation at the glutes and chest to increase glute activation and chest elevation. He then completed B scapular retraction with BUE on a theraband and unilateral R/L theraband rows with manual resistance graded to challenge repetitions, 2x10. He had minimal scapular retraction activation with sustained winging. OT providing pressure into scapulas during exercise to promote improved medial border stability. Pt stood at the high-low table and completed abduction/adduction towel slides with alternating UE. D/t fatigue he required posterior support of OT while he completed these. Poor adduction demonstrated. Pt returned to the w/c and then to his room. He required heavy assist to stand from low w/c, max +2 assist before transferring to the EOB. He was left sitting EOB with all needs met, wife present.    Therapy Documentation Precautions:  Precautions Precautions: Back Precaution Comments: Kyphoplasty 05/31/22 Required Braces or Orthoses: Spinal Brace Spinal Brace: Thoracolumbosacral  orthotic Restrictions Weight Bearing Restrictions: No  Therapy/Group: Individual Therapy  Luis [name] 06/05/2022, 1:47 PM

## 2022-06-05 NOTE — Evaluation (Signed)
Physical Therapy Assessment and Plan  Patient Details  Name: Luis [name]. MRN: [id number] Date of Birth: 08-10-1956  PT Diagnosis: Abnormal posture, Abnormality of gait, Difficulty walking, Low back pain, and Muscle weakness Rehab Potential: Good ELOS: 5-7 days   Today's Date: 06/05/2022 PT Individual Time: 1045-1200 PT Individual Time Calculation (min): 75 min    Hospital Problem: Principal Problem:   Lumbar compression fracture [facility])   Past Medical History:  Past Medical History:  Diagnosis Date   Bilateral foot-drop 10/19/2018   Bursitis, trochanteric    Episodic   Carpal tunnel syndrome on right    Degenerative disk disease    l5-S1   FSH (facioscapulohumeral muscular dystrophy) ([place]) 10/07/2017   Gait abnormality 10/19/2018   GERD (gastroesophageal reflux disease)    Hemorrhoids    with anal fissures   Left lateral epicondylitis    Parkinson's disease 10/19/2018   Skin cancer    right temple area   Past Surgical History:  Past Surgical History:  Procedure Laterality Date   CHOLECYSTECTOMY  2007   HEMORRHOIDECTOMY WITH HEMORRHOID BANDING     IR KYPHO LUMBAR INC FX REDUCE BONE BX UNI/BIL CANNULATION INC/IMAGING  01/10/2020   IR KYPHO LUMBAR INC FX REDUCE BONE BX UNI/BIL CANNULATION INC/IMAGING  05/31/2022    Assessment & Plan Clinical Impression: Patient is a 66 year old right-handed male with with history of Parkinson's disease maintained on Sinemet, chronic severe neuropathic pain, FSHD muscular dystrophy followed by Dr. [name].  Per chart review patient lives with spouse.  Uses a rolling walker for mobility.  He had been wearing AFOs for bilateral foot drop but did not feel they were effective.  Presented 05/27/2022 after mechanical fall with increased pain in the lumbar region.  He does have a history of prior kyphoplasty at L5 01/10/2020.  MRI images revealed acute compression fracture of L3 with approximately 40% vertebral body height loss.   Unchanged compression deformity of L5.  Underwent L3 balloon kyphoplasty 05/31/2022 per interventional radiology Dr.De Sindy Messing.  He was ordered a thoracolumbosacral orthotic brace but had trouble tolerating the brace.  He remains on Sinemet as prior to admission for history of Parkinson's disease.  Placed on subcutaneous heparin for DVT prophylaxis.  Maintained on Dilaudid for pain control at home due to painful Muscular dystrophy.. Therapy evaluations completed due to patient decreased functional mobility was admitted for a comprehensive rehab program.  Patient currently requires min with mobility secondary to muscle weakness, decreased cardiorespiratoy endurance, abnormal tone, and decreased standing balance and decreased balance strategies.  Prior to hospitalization, patient was independent  with mobility and lived with Spouse in a House home.  Home access is  Level entry (Lift to get inside the home).  Patient will benefit from skilled PT intervention to maximize safe functional mobility, minimize fall risk, and decrease caregiver burden for planned discharge home with intermittent assist.  Anticipate patient will benefit from follow up OP at discharge.  PT - End of Session Activity Tolerance: Tolerates 30+ min activity with multiple rests Endurance Deficit: Yes Endurance Deficit Description: generalized weakness PT Assessment Rehab Potential (ACUTE/IP ONLY): Good PT Barriers to Discharge: Other (comments) PT Barriers to Discharge Comments: Disease progression PT Patient demonstrates impairments in the following area(s): Balance;Pain;Endurance;Motor PT Transfers Functional Problem(s): Bed Mobility;Bed to Chair;Car PT Locomotion Functional Problem(s): Ambulation;Wheelchair Mobility PT Plan PT Intensity: Minimum of 1-2 x/day ,45 to 90 minutes PT Frequency: 5 out of 7 days PT Duration Estimated Length of Stay: 5-7 days PT  Treatment/Interventions: Ambulation/gait training;Discharge  planning;DME/adaptive equipment instruction;Functional mobility training;Pain management;Psychosocial support;Splinting/orthotics;Therapeutic Activities;UE/LE Strength taining/ROM;Visual/perceptual remediation/compensation;Balance/vestibular training;Community reintegration;Disease management/prevention;Neuromuscular re-education;Patient/family education;Skin care/wound management;Stair training;Therapeutic Exercise;UE/LE Coordination activities;Wheelchair propulsion/positioning PT Transfers Anticipated Outcome(s): Supv/ModI with RW PT Locomotion Anticipated Outcome(s): Supv/ModI with RW PT Recommendation Recommendations for Other Services: Neuropsych consult Follow Up Recommendations: Outpatient PT Patient destination: Home Equipment Recommended: To be determined Equipment Details: Patient owns RW, power wheelchair, bed rails, electric lifts, ramp   PT Evaluation Precautions/Restrictions Precautions Precautions: Back Precaution Comments: Kyphoplasty 05/31/22 Required Braces or Orthoses: Spinal Brace Spinal Brace: Thoracolumbosacral orthotic Restrictions Weight Bearing Restrictions: No Pain Interference Pain Interference Pain Effect on Sleep: 3. Frequently Pain Interference with Therapy Activities: 2. Occasionally Pain Interference with Day-to-Day Activities: 2. Occasionally Home Living/Prior Functioning Home Living Living Arrangements: Spouse/significant other Available Help at Discharge: Family;Available 24 hours/day Type of Home: House Home Access: Level entry (Lift to get inside the home) Home Layout: One level Alternate Level Stairs-Number of Steps: Stair lift Bathroom Shower/Tub: Multimedia programmer: Handicapped height Location manager) Bathroom Accessibility: Yes  Lives With: Spouse Prior Function Level of Independence: Requires assistive device for independence;Needs assistance with ADLs;Needs assistance with homemaking;Independent with transfers;Needs  assistance with gait  Able to Take Stairs?: No Driving: Yes (Last drove to church and back on September 17th.) Vocation: Retired Leisure: Hobbies-yes (Comment) (Enjoys being outside) Vision/Perception  Vision - History Ability to See in Adequate Light: 0 Adequate Perception Perception: Within Functional Limits Praxis Praxis: Intact  Cognition Overall Cognitive Status: Within Functional Limits for tasks assessed Arousal/Alertness: Awake/alert Orientation Level: Oriented X4 Year: 2023 Month: October Day of Week: Correct Memory: Appears intact Awareness: Appears intact Problem Solving: Appears intact Safety/Judgment: Appears intact Sensation Sensation Light Touch: Appears Intact Hot/Cold: Appears Intact Coordination Gross Motor Movements are Fluid and Coordinated: No Fine Motor Movements are Fluid and Coordinated: No Coordination and Movement Description: 2/2 FSHD muscular dystrophy and parkinsons. Pt and his wife report coordination and movement is near baseline Motor  Motor Motor: Abnormal postural alignment and control Motor - Skilled Clinical Observations: FSHD muscular dystrophy and parkinsons.   Trunk/Postural Assessment  Cervical Assessment Cervical Assessment: Within Functional Limits Thoracic Assessment Thoracic Assessment: Exceptions to [facility] (Kyphotic posture with rounded shoulders) Lumbar Assessment Lumbar Assessment: Exceptions to [facility] (Posterior pelvic tilt) Postural Control Postural Control: Deficits on evaluation Trunk Control: delayed 2/2 FSHD muscular dystrophy and parkinsons  Balance Balance Balance Assessed: Yes Static Sitting Balance Static Sitting - Balance Support: Feet supported Static Sitting - Level of Assistance: 5: Stand by assistance Dynamic Sitting Balance Dynamic Sitting - Balance Support: Feet supported Dynamic Sitting - Level of Assistance: 5: Stand by assistance Static Standing Balance Static Standing - Balance Support: During  functional activity;Bilateral upper extremity supported Static Standing - Level of Assistance:  (CGA) Dynamic Standing Balance Dynamic Standing - Balance Support: During functional activity;Bilateral upper extremity supported Dynamic Standing - Level of Assistance: 4: Min assist Dynamic Standing - Comments: Patient has very unique motor compensations during his mobility that he uses to complete functional mobility Extremity Assessment  RUE Assessment RUE Assessment: Exceptions to [facility] General Strength Comments: Pt able to achieve about 70 degress of shoulder flexion with large abduction compensations. Scapulas heavily retracted and winging with functional reaching. Resting tremor also present LUE Assessment LUE Assessment: Exceptions to [facility] General Strength Comments: Slightly more range than the RUE and less of a tremor. Pt still limited by scapular musculature atrophy and abduction compensation RLE Assessment RLE Assessment: Exceptions to [facility] General Strength Comments: [facility] for  muscular dystrophy diagnosis- Grossly 3+/5 LLE Assessment LLE Assessment: Exceptions to [facility] General Strength Comments: [facility] for muscular dystrophy diagnosis- Grossly 3+/5  Care Tool Care Tool Bed Mobility Roll left and right activity   Roll left and right assist level: Minimal Assistance - Patient > 75%    Sit to lying activity   Sit to lying assist level: Moderate Assistance - Patient 50 - 74%    Lying to sitting on side of bed activity   Lying to sitting on side of bed assist level: the ability to move from lying on the back to sitting on the side of the bed with no back support.: Moderate Assistance - Patient 50 - 74%     Care Tool Transfers Sit to stand transfer   Sit to stand assist level: Minimal Assistance - Patient > 75%    Chair/bed transfer   Chair/bed transfer assist level: Minimal Assistance - Patient > 75%     Toilet transfer   Assist Level: Minimal Assistance - Patient > 75%    Car  transfer   Car transfer assist level: Minimal Assistance - Patient > 75%      Care Tool Locomotion Ambulation   Assist level: Minimal Assistance - Patient > 75% Assistive device: Walker-rolling Max distance: 100'  Walk 10 feet activity   Assist level: Minimal Assistance - Patient > 75% Assistive device: Walker-rolling   Walk 50 feet with 2 turns activity   Assist level: Minimal Assistance - Patient > 75% Assistive device: Walker-rolling  Walk 150 feet activity Walk 150 feet activity did not occur: Safety/medical concerns (Unable to ambulate >100' at this time secondary to impaired endurance/activity tolerance.)      Walk 10 feet on uneven surfaces activity Walk 10 feet on uneven surfaces activity did not occur: Safety/medical concerns (Patient has a mehanical lift chair at PLOF- Did not perform stair mobility, ramp, or ambulate over uneven surfaces prior to admission.)      Stairs Stair activity did not occur: Safety/medical concerns        Walk up/down 1 step activity Walk up/down 1 step or curb (drop down) activity did not occur: Safety/medical concerns      Walk up/down 4 steps activity Walk up/down 4 steps activity did not occur: Safety/medical concerns      Walk up/down 12 steps activity Walk up/down 12 steps activity did not occur: Safety/medical concerns      Pick up small objects from floor Pick up small object from the floor (from standing position) activity did not occur: Safety/medical concerns      Wheelchair Is the patient using a wheelchair?: Yes Type of Wheelchair: Manual (Patient has power wheelchair at home, however is only used in the community and not within the home.)   Wheelchair assist level: Dependent - Patient 0% Max wheelchair distance: Therapist propelled manual wheelchair for time management.  Wheel 50 feet with 2 turns activity   Assist Level: Dependent - Patient 0%  Wheel 150 feet activity   Assist Level: Dependent - Patient 0%    Refer to  Care Plan for Long Term Goals  SHORT TERM GOAL WEEK 1 PT Short Term Goal 1 (Week 1): STG=LTG secondary to ELOS  Recommendations for other services: Neuropsych  Skilled Therapeutic Intervention Mobility Bed Mobility Bed Mobility: Rolling Right;Rolling Left;Supine to Sit;Sit to Supine Rolling Right: Minimal Assistance - Patient > 75% Rolling Left: Minimal Assistance - Patient > 75% Supine to Sit: Moderate Assistance - Patient 50-74% Sit to Supine: Moderate Assistance -  Patient 50-74% Transfers Transfers: Sit to Stand;Stand to [employer] Transfers Sit to Stand: Minimal Assistance - Patient > 75% Stand to Sit: Minimal Assistance - Patient > 75% Stand Pivot Transfers: Minimal Assistance - Patient > 75% Transfer (Assistive device): Rolling walker Locomotion  Gait Ambulation: Yes Gait Assistance: Minimal Assistance - Patient > 75% Gait Distance (Feet): 100 Feet Assistive device: Rolling walker Gait Assistance Details: Decreased cadence with forward flexed posturing and frequent standing rest breaks required Gait Gait: Yes Gait Pattern: Left steppage;Right steppage;Decreased dorsiflexion - left;Decreased dorsiflexion - right;Wide base of support Gait velocity: Decreased Stairs / Additional Locomotion Stairs: No Ramp: Dependent - mechanical lift Curb: Dependent - Environmental consultant Mobility: Yes Wheelchair Assistance: Dependent - Patient 0% Wheelchair Parts Management: Needs assistance   Patient greeted supine in bed with spouse, [name], present and agreeable to PT treatment session. Patient and spouse demonstrated how patient transitions from supine to sitting EOB since being in the hospital- Patient required [place] for managing B LE and righting trunk. Once sitting EOB, patient was able to scoot toward the edge with increased time. Bed height was elevated in order to simulate patient's bed height at home. Patient performed sit/stand with CGA/MinA,  progressing toward CGA/SBA with increased repetition. While standing, patient demonstrates 45 degree flexed posturing which increased to 90 degrees of forward flexion as he fatigued. Patient reports most comfortable positioning is being "perched" on the EOB with bed raised to highest setting. Per patient and spouse report, patient does not stand from low surfaces at home- He has a toilet lift and a chair lift. Patient gait trained x100' with RW and CGA/MinA- Progressing toward CGA/SBA. Patient took various standing rest breaks throughout gait trails by resting against the wall. As patient fatigued throughout gait trial he demonstrated 90 degree forward flexion with forehead almost touching front of RW. Patient sat in manual wheelchair, which was brought during gait trial, however requires total assistance fo 2 for transitioning from sitting to standing from low height of manual wheelchair- this is patient's baseline. Patient transferred to/from manual wheelchair and car simulator with RW and MinA- Once seated in car simulator patient was able to place B LE into/out of the car independently. Patient transitioned back to his room via wheelchair and transitioned from sitting EOB to supine with [place] for B LE management. Patient left supine in bed with call bell within reach, bed alarm on and all needs met.    Discharge Criteria: Patient will be discharged from PT if patient refuses treatment 3 consecutive times without medical reason, if treatment goals not met, if there is a change in medical status, if patient makes no progress towards goals or if patient is discharged from hospital.  The above assessment, treatment plan, treatment alternatives and goals were discussed and mutually agreed upon: by patient and by family  Dominica  Zaydin Billey 06/05/2022, 1:24 PM

## 2022-06-06 DIAGNOSIS — R770 Abnormality of albumin: Secondary | ICD-10-CM | POA: Diagnosis not present

## 2022-06-06 DIAGNOSIS — S32030D Wedge compression fracture of third lumbar vertebra, subsequent encounter for fracture with routine healing: Secondary | ICD-10-CM | POA: Diagnosis not present

## 2022-06-06 DIAGNOSIS — G7100 Muscular dystrophy, unspecified: Secondary | ICD-10-CM | POA: Diagnosis not present

## 2022-06-06 DIAGNOSIS — M792 Neuralgia and neuritis, unspecified: Secondary | ICD-10-CM | POA: Diagnosis not present

## 2022-06-06 NOTE — Progress Notes (Signed)
Occupational Therapy Session Note  Patient Details  Name: Luis [name]. MRN: [id number] Date of Birth: 1956/08/01  Session 1 Today's Date: 06/06/2022 OT Individual Time: 0177-9390 OT Individual Time Calculation (min): 57 min    Session 2 Today's Date: 06/06/2022 OT Individual Time: 1400-1510 OT Individual Time Calculation (min): 70 min    Short Term Goals: Week 1:  OT Short Term Goal 1 (Week 1): STG= LTG d/t ELOS  Skilled Therapeutic Interventions/Progress Updates:    Session 1 Pt received sitting EOB with his wife Luis [name] present and supportive. He was agreeable to take a shower this morning. He stood from the EOB with CGA. Ambulatory transfer into the bathroom with the RW, alternating between UE supported in abduction and fully bent over as he circumducted B hips to compensate for hip flexion weakness. Pt CGA overall for this method. He stood in the shower "perched" on the grab bar with BUE support on the wall and grab bar. He and his wife confirm this is very similar to what he does at home 2/2 very limited sitting tolerance. He required max A for bathing in standing. He returned to EOB and required mod A to don a shirt with improved ability to bring it overhead. Cueing provided for compensatory technique. He then completed LB dressing with the bed lowered and he was able to thread underwear and shorts over BLE and then he required max A to pull up in standing. He completed 50 ft of functional mobility with CGA- (S) overall. He required perched rest breaks against the wall with min A to elevate his chest every 10 ft. He c/o increased fatigue today. He returned to the EOB and was left sitting up with all needs met, wife present.    Session 2 Pt received supine with 5/10 pain in his back and L arm. Discussed muscle soreness vs MD pain and need for pain medication. He completed 238 ft of functional mobility to the therapy gym with the RW. He required a perched rest break every 15 ft or so.  He required CGA for the mobility overall but min A to bring chest up to the wall when resting. He required several rest breaks during the session for fatigue. He completed 3x10 glute extensions to increase upright posture in standing. He also completed 3x8 scapular push ups with OT providing deep input to stabilize his B scapula. No reports of pain with this activity. Greatly improved rhomboid activation bilaterally. He trialed use of the eva walker to see its benefits on providing more proximal stabilization during mobility. He completed ~5 ft of mobility before deciding the handles were not positioned well toward his face and gave him some increased pain in his L elbow. Did assess his elbow as wife had concerns of recurrent busitis. No fluid palpated at this time, encouraged them to f/u with MD tomorrow morning. Pt completed another 100 ft of functional mobility to his room before sitting in the w/c and being wheeled the remainder of the way. He transferred to the EOB with CGA using the RW and then to supine. Pt left supine with all needs met, wife present.    Therapy Documentation Precautions:  Precautions Precautions: Back Precaution Comments: Kyphoplasty 05/31/22 Required Braces or Orthoses: Spinal Brace Spinal Brace: Thoracolumbosacral orthotic Restrictions Weight Bearing Restrictions: No  Therapy/Group: Individual Therapy  Curtis Sites 06/06/2022, 6:45 AM

## 2022-06-06 NOTE — Progress Notes (Signed)
Patient ID: Luis [name]., male   DOB: May 20, 1956, 66 y.o.   MRN: [id number]  Met with pt and wife to give team conference update regarding goals of supervision level and discharge date 10/11. Discussed therapy recommendation of OPPT and OT. Both agree and want to go to OP neuro on [address]. Has all equipment from previous admissions. Have faxed over order for OP therapies and have asked them to contcat wife to arrange appointments.

## 2022-06-06 NOTE — Progress Notes (Signed)
PROGRESS NOTE   Subjective/Complaints: Reports leg pain is improved today. He is using dilaudid and this is helping control back pain.  He was on his way to toilet to have BM today.  ROS: denies CP,SOB, N/V + back pain-chronic, improves with standing +tremors Objective:   No results found. Recent Labs    06/04/22 1407 06/05/22 0714  WBC 5.6 4.5  HGB 14.7 13.6  HCT 43.1 41.7  PLT 282 242    Recent Labs    06/04/22 1407 06/05/22 0714  NA  --  137  K  --  3.9  CL  --  102  CO2  --  26  GLUCOSE  --  115*  BUN  --  8  CREATININE 0.44* 0.36*  CALCIUM  --  9.1     Intake/Output Summary (Last 24 hours) at 06/06/2022 1510 Last data filed at 06/06/2022 0849 Gross per 24 hour  Intake 358 ml  Output 250 ml  Net 108 ml         Physical Exam: Vital Signs Blood pressure 107/77, pulse 66, temperature 98 F (36.7 C), resp. rate 16, height '5\' 11"'$  (1.803 m), weight 71.1 kg, SpO2 97 %.   General: Alert and oriented x 3, No apparent distress, wife at bedside HEENT: Head is normocephalic, atraumatic, conjugate gaze, oral mucosa pink and moist Neck: Supple without JVD or lymphadenopathy Heart:  No murmurs rubs or gallops Chest: non-labored Abdomen: Soft, non-tender, non-distended, bowel sounds positive. Extremities: No clubbing, cyanosis, or edema. Pulses are 2+ Psych: A little flat Skin: Clean and intact without signs of breakdown Bandages over L spine C/D/I Musculoskeletal:     Cervical back: Neck supple. No tenderness.     Comments: RUE- deltoid 2/5; Biceps 2-/5; Triceps 2+/5; WE 5-/5; Grip 4+/5; FA 4-/5 LUE- same except FA 4+/5 RLE- HF 4-/5; KE 4+/5' DF 0/5; PF 4+/5 LLE- same except HF 4/5 Muscle atrophy in shoulder and hip girdles Adquate core strength No TTP across low back around compression fx   Neurological: Follows commands, RUE tremor related to PD  Assessment/Plan: 1. Functional deficits which  require 3+ hours per day of interdisciplinary therapy in a comprehensive inpatient rehab setting. Physiatrist is providing close team supervision and 24 hour management of active medical problems listed below. Physiatrist and rehab team continue to assess barriers to discharge/monitor patient progress toward functional and medical goals  Care Tool:  Bathing    Body parts bathed by patient: Right arm, Abdomen, Front perineal area, Right upper leg, Left upper leg, Face   Body parts bathed by helper: Left arm, Chest, Buttocks, Left lower leg, Right lower leg     Bathing assist Assist Level: Maximal Assistance - Patient 24 - 49%     Upper Body Dressing/Undressing Upper body dressing   What is the patient wearing?: Pull over shirt    Upper body assist Assist Level: Moderate Assistance - Patient 50 - 74%    Lower Body Dressing/Undressing Lower body dressing      What is the patient wearing?: Underwear/pull up, Pants     Lower body assist Assist for lower body dressing: 2 Helpers     Toileting Toileting  Toileting assist Assist for toileting: 2 Helpers     Transfers Chair/bed transfer  Transfers assist  Chair/bed transfer activity did not occur: Safety/medical concerns  Chair/bed transfer assist level: Minimal Assistance - Patient > 75%     Locomotion Ambulation   Ambulation assist      Assist level: Minimal Assistance - Patient > 75% Assistive device: Walker-rolling Max distance: 100'   Walk 10 feet activity   Assist     Assist level: Minimal Assistance - Patient > 75% Assistive device: Walker-rolling   Walk 50 feet activity   Assist    Assist level: Minimal Assistance - Patient > 75% Assistive device: Walker-rolling    Walk 150 feet activity   Assist Walk 150 feet activity did not occur: Safety/medical concerns (Unable to ambulate >100' at this time secondary to impaired endurance/activity tolerance.)         Walk 10 feet on uneven  surface  activity   Assist Walk 10 feet on uneven surfaces activity did not occur: Safety/medical concerns (Patient has a mehanical lift chair at PLOF- Did not perform stair mobility, ramp, or ambulate over uneven surfaces prior to admission.)         Wheelchair     Assist Is the patient using a wheelchair?: Yes Type of Wheelchair: Manual (Patient has power wheelchair at home, however is only used in the community and not within the home.)    Wheelchair assist level: Dependent - Patient 0% Max wheelchair distance: Therapist propelled manual wheelchair for time management.    Wheelchair 50 feet with 2 turns activity    Assist        Assist Level: Dependent - Patient 0%   Wheelchair 150 feet activity     Assist      Assist Level: Dependent - Patient 0%   Blood pressure 107/77, pulse 66, temperature 98 F (36.7 C), resp. rate 16, height '5\' 11"'$  (1.803 m), weight 71.1 kg, SpO2 97 %.  Medical Problem List and Plan: 1. Functional deficits secondary to L3 compression fracture.  Status post kyphoplasty 9/29.  Back brace when out of bed.  May remove brace to shower.             -patient may  shower             -ELOS/Goals: 10-14 days- supervision to min A due to FSHD  -Continue CIR  -PRAFO ordered for night use bilateral 2.  Antithrombotics: -DVT/anticoagulation:  Pharmaceutical: Heparin             -antiplatelet therapy: n/a 3. Pain Management/history of chronic severe neuropathic pain: Cymbalta 30 mg twice daily, Dilaudid 8 mg every 6 hours as needed- will change dilaudid to 8 mg QID so doesn't have to keep asking for it- takes scheduled at home.   -10/4 Start gabapentin '100mg'$  TID  -10/5 leg pain improved, continue gabapentin 4. Mood/Behavior/Sleep: Valium 10 mg twice daily as needed anxiety             -antipsychotic agents: N/A 5. Neuropsych/cognition: This patient is capable of making decisions on his own behalf. 6. Skin/Wound Care: Routine skin checks 7.  Fluids/Electrolytes/Nutrition: Routine in and outs with follow-up chemistries 8.  Parkinson's disease.  Sinemet 50-200 mg 3 times daily 9.  History of FSHD muscular dystrophy.  Follow-up [facility] Dr. Tillman Abide 10.  Constipation.  Colace 100 mg nightly, MiraLAX daily, Chronulac as needed  -LBM 10/4 improved 11. FSHD- can use his home trilegy for breathing issues due to Muscular  dystrophy  -10/5 compliant with home machine last night 12. Low albumin  -Ate 100% his breakfast this AM, continue ensure      LOS: 2 days A FACE TO FACE EVALUATION WAS PERFORMED  Jennye Boroughs 06/06/2022, 3:10 PM

## 2022-06-06 NOTE — Progress Notes (Signed)
Physical Therapy Session Note  Patient Details  Name: Luis [name]. MRN: [id number] Date of Birth: 1956/08/27  Today's Date: 06/06/2022 PT Individual Time: 0950-1105 PT Individual Time Calculation (min): 75 min   Short Term Goals: Week 1:  PT Short Term Goal 1 (Week 1): STG=LTG secondary to ELOS  Skilled Therapeutic Interventions/Progress Updates: Pt presented perched EOB with wife present agreeable to therapy. Pt with increased pain at incision site up to 8/10. Per wife, deferred pain meds this am due to pt wanting to be alert/awake during session. Throughout session provided education on maintaining meds at scheduled time due to pain currently at higher levels than usual. Pt and wife voiced understanding. Wife also noted that PRAFOs arrived and that they had used them in the past. When used previously pt had limited tolerance to wear and would trigger MD pain in lower back. Discussed that it was worth the trial however if pt and wife aware will trigger pain they can refuse use. Pt indicated that he has been perched since OT session and back pain has slowly increased. PTA provided education on importance of rest breaks during CIR stay to allow for optimum results. Pt then attempted to ambulate to day room however due to back pain required x 3 rest breaks (standing against wall with PTA and wife assisting pt to erect posture) within 52f. Pt was then transferred to w/c and transported remaining distance to day room. In day room participated in seated LE therex as noted: LAQ with 2.5lb wt 2 x 10, hamstring pulls against manual resistance 2 x 10, hip flexion against gravity 2 x 10, ball squeezes 2 x 10, hip er with red theraband 2 x 10. Pt able to tolerate all exercises without significant increase in pain. Pt then agreeable to attempt to ambulate back to room. Pt required maxA x 2 to stand due to being in low w/c. Pt then ambulated back to room with full forward flexed posture but required several rest  breaks against wall due to fatigue. In room pt agreeable to rest in bed. At bed side performed sit to supine with modA and pt was able to roll to R with minA and use of bed features and allow PTA to place ice pack. Pt left in bed at end of session with call bell within reach, wife present and current needs met.      Therapy Documentation Precautions:  Precautions Precautions: Back Precaution Comments: Kyphoplasty 05/31/22 Required Braces or Orthoses: Spinal Brace Spinal Brace: Thoracolumbosacral orthotic Restrictions Weight Bearing Restrictions: No General:   Vital Signs: Therapy Vitals Temp: 98 F (36.7 C) Pulse Rate: 66 Resp: 16 BP: 107/77 Patient Position (if appropriate): Sitting Oxygen Therapy SpO2: 97 % O2 Device: Room Air Pain:   Mobility:   Locomotion :    Trunk/Postural Assessment :    Balance:   Exercises:   Other Treatments:      Therapy/Group: Individual Therapy  Shila Kruczek 06/06/2022, 4:38 PM

## 2022-06-07 DIAGNOSIS — S32030D Wedge compression fracture of third lumbar vertebra, subsequent encounter for fracture with routine healing: Secondary | ICD-10-CM | POA: Diagnosis not present

## 2022-06-07 DIAGNOSIS — M25522 Pain in left elbow: Secondary | ICD-10-CM

## 2022-06-07 DIAGNOSIS — M25521 Pain in right elbow: Secondary | ICD-10-CM

## 2022-06-07 DIAGNOSIS — K5900 Constipation, unspecified: Secondary | ICD-10-CM | POA: Diagnosis not present

## 2022-06-07 DIAGNOSIS — R770 Abnormality of albumin: Secondary | ICD-10-CM | POA: Diagnosis not present

## 2022-06-07 MED ORDER — DICLOFENAC SODIUM 1 % EX GEL
2.0000 g | Freq: Four times a day (QID) | CUTANEOUS | Status: DC
  Administered 2022-06-07 – 2022-06-12 (×19): 2 g via TOPICAL
  Filled 2022-06-07: qty 100

## 2022-06-07 NOTE — Progress Notes (Signed)
Orthopedic Tech Progress Note Patient Details:  Luis [name] Oct 31, 1955 [id number]  Patient ID: Luis Quails Hjort [name]., male   DOB: 06/08/1956, 66 y.o.   MRN: [id number] Order placed with [facility] for toe up braces.  Vernona Rieger 06/07/2022, 5:49 PM

## 2022-06-07 NOTE — Progress Notes (Signed)
Physical Therapy Session Note  Patient Details  Name: Luis [name]. MRN: [id number] Date of Birth: Jul 01, 1956  Today's Date: 06/07/2022 PT Individual Time: 1100-1208 and 1330-1400 PT Individual Time Calculation (min): 68 min and 30 min  Short Term Goals: Week 1:  PT Short Term Goal 1 (Week 1): STG=LTG secondary to ELOS  Skilled Therapeutic Interventions/Progress Updates: Tx1: Pt presented at bedside with wife present agreeable to therapy. Per pt and wife pain in back 10/10, premedicated and rest breaks provided as needed. Extensive discussion on pain due to activity vs possible re-injury to back (per spouse's concern). Pt ultimately determined it felt more throbbing/ache due to activity vs sharp/burning pain. Pt also c/o increased L elbow/shoulder pain which after discussion was contributed to extensive ambulation (pt has been walking >237f with RW which is greater than norm). Decision was made to ambulate shorter distances today but focus on more postural aspects vs distance. Pt ambulated out of room in heavy forward flexed posture then transferred to w/c and transported remaining distance to rehab gym. In gym PTA discussed possible use of foot up brace to help maintain B feet in neutral during ambulation which may help with energy conservation. PTA provided sample and advised works similarly to AFO but softer and may be easier to manage as well as more comfortable than traditional AFO. Adv pt to think about it and can trial on Sunday when this PTA will see pt again. Pt then performed Sit to stand with modA x 2 and transferred to perch on elevated mat. Pt then participated in modified standing LE therex for general strengthening including hip flexion, LAQ, heel raises all performed to fatigue bilaterally ~20-25. Pt then ambulated to parallel bars (~134f with PTA supporting pt's trunk to improve posture. In parallel bars pt performed hip abd/add, hamstring curls, and mini squats to fatigue. Pt then  ambulated in parallel bars [id number] with emphasis on improved posture and activation of extensors. Pt did require rest breaks between each bout due to soreness and fatigue however pt did indicate some relief in LUE when posture was more erect. Pt then ambulated back towards room for endurance however unable to tolerate more than [id number] required seated rest. Pt transported back to room at end of session and after stand (requiring +2) pt ambulated back to EOB and left perched sitting EOB with wife present and current needs met.   Tx2: PTA provided additional session for trial of foot up brace. PTA entered room with pt, wife, and pt's PCP in room. PCP was preparing to leave and pt agreeable to trial foot up brace. PTA demonstrated brace, how it works on foot, and had wife hold one as major concern was weight. Pt agreeable to try R foot but wished to defer L foot at this time. PTA donned R foot up and pt ambulated to hallway with PTA providing truncal support then took brief rest at wall. Pt then ambulated back to EOB in same manner. This was performed 2 more times in same manner. Pt expressed that currently felt comfortable and did not cause more stress on back. Pt agreeable to continue trialing foot up brace over weekend and PTA will place order with PA. Pt and wife verbalized understanding. Pt left resting at EOB with wife present and current needs met.      Therapy Documentation Precautions:  Precautions Precautions: Back Precaution Comments: Kyphoplasty 05/31/22 Required Braces or Orthoses: Spinal Brace Spinal Brace: Thoracolumbosacral orthotic Restrictions Weight Bearing Restrictions: No  General:   Vital Signs: Therapy Vitals Temp: (!) 97.3 F (36.3 C) Pulse Rate: 61 Resp: 19 BP: 105/75 Patient Position (if appropriate): Sitting Oxygen Therapy SpO2: 99 % O2 Device: Room Air Pain:   Mobility:   Locomotion :    Trunk/Postural Assessment :    Balance:   Exercises:   Other  Treatments:      Therapy/Group: Individual Therapy  Kathryne Ramella 06/07/2022, 4:27 PM

## 2022-06-07 NOTE — Progress Notes (Signed)
Occupational Therapy Session Note  Patient Details  Name: Luis [name]. MRN: [id number] Date of Birth: 1955/11/12  Session 1  Today's Date: 06/07/2022 OT Individual Time: 2831-5176 OT Individual Time Calculation (min): 83 min   Session 2  Today's Date: 06/07/2022 OT Individual Time: 1607-3710 OT Individual Time Calculation (min): 54 min    Short Term Goals: Week 1:  OT Short Term Goal 1 (Week 1): STG= LTG d/t ELOS  Skilled Therapeutic Interventions/Progress Updates:    Session 1 Pt received sitting EOB with 6/10 pain, reporting he is premedicated and agreeable to OT session. Pt completed 220 ft of functional mobility to the therapy gym with the RW, alternating between fully flexed forward posture with support in UE abduction and in slightly kyphotic, UE abducted position. He required an improved total of 4 rest breaks for this distance. He took an extended rest break. Problem solving with clinical specialist re area of focus in strengthening/AROM of pt's UE to maximize function in normal vs compensatory motor patterns. Pt transferred into supine on the mat with a wedge and completed several scapular stability focused activities. Manual input provided through extended UE to activate serratus anterior to increase scapular stability on the trunk. Closed chain chest press in supine with focus on pec activation and mod facilitation to complete more than 20 degrees of shoulder flexion. Mod facilitation required. He then completed 1 set of glute bridges in supine (verified with PA that these were within spinal precautions) with better than expected activation of above 4 inches of lift from the mat with support on BLE. He reported urgent urinary needs and OT assisted with urinal use seated. He completed 50 ft of functional mobility with the RW before requesting to use the w/c the remainder of the way. Pt left supine with all needs met, wife present.    Session 2 Pt received sitting EOB with  moderate pain in his back and shoulders. Extensive discussion with him and the PT Rosita re need to limit functional mobility d/t ongoing shoulder pain and MD associated pain/fatigue. Pt completed 10 ft of functional mobility with one foot up brace to trial. He then transferred to the w/c and was taken to the therapy gym. Second foot up brace donned and pt provided edu on use and indication. Pt completed another 10 ft with much improved forward hip flexion and foot drop. He then completed back extension exercises 3x10 repetitions in seated simulating good morning exercises but with much limited range of motion to decrease back pain. He then completed 3x10 sit <> stands with limited UE involvement the first two sets to maximize glute and quad activation. Final set completed with UE on the RW to facilitate erect chest. Pt returned to the w/c and then to his room. He completed another 15 ft of functional mobility to the bed and then to the bathroom with his wife assisting. He was left sitting on the elevated BSC with his wife supervising.     Therapy Documentation Precautions:  Precautions Precautions: Back Precaution Comments: Kyphoplasty 05/31/22 Required Braces or Orthoses: Spinal Brace Spinal Brace: Thoracolumbosacral orthotic Restrictions Weight Bearing Restrictions: No      Therapy/Group: Individual Therapy  Curtis Sites 06/07/2022, 6:41 AM

## 2022-06-07 NOTE — IPOC Note (Signed)
Overall Plan of Care [facility]) Patient Details Name: Luis [name]. MRN: [id number] DOB: Apr 13, 1956  Admitting Diagnosis: Lumbar compression fracture [facility])  Hospital Problems: Principal Problem:   Lumbar compression fracture (HCC) Active Problems:   Low serum albumin     Functional Problem List: Nursing    PT Balance, Pain, Endurance, Motor  OT Balance, Motor, Safety, Pain, Endurance  SLP    TR         Basic ADL's: OT Bathing, Dressing, Toileting     Advanced  ADL's: OT       Transfers: PT Bed Mobility, Bed to Chair, Car  OT Toilet     Locomotion: PT Ambulation, Wheelchair Mobility     Additional Impairments: OT None  SLP        TR      Anticipated Outcomes Item Anticipated Outcome  Self Feeding (S)  Swallowing      Basic self-care  caregiver assisting- mod A  Toileting  max A   Bathroom Transfers (S)  Bowel/Bladder     Transfers  Supv/ModI with RW  Locomotion  Supv/ModI with RW  Communication     Cognition     Pain     Safety/Judgment      Therapy Plan: PT Intensity: Minimum of 1-2 x/day ,45 to 90 minutes PT Frequency: 5 out of 7 days PT Duration Estimated Length of Stay: 5-7 days OT Intensity: Minimum of 1-2 x/day, 45 to 90 minutes OT Frequency: 5 out of 7 days OT Duration/Estimated Length of Stay: 5-7 days     Team Interventions: Nursing Interventions    PT interventions Ambulation/gait training, Discharge planning, DME/adaptive equipment instruction, Functional mobility training, Pain management, Psychosocial support, Splinting/orthotics, Therapeutic Activities, UE/LE Strength taining/ROM, Visual/perceptual remediation/compensation, Training and development officer, Community reintegration, Disease management/prevention, Neuromuscular re-education, Patient/family education, Skin care/wound management, IT trainer, Therapeutic Exercise, UE/LE Coordination activities, Wheelchair propulsion/positioning  OT Interventions Human resources officer, Self Care/advanced ADL retraining, Therapeutic Exercise, DME/adaptive equipment instruction, Pain management, Skin care/wound managment, UE/LE Strength taining/ROM, UE/LE Coordination activities, Patient/family education, Therapeutic Activities, Functional mobility training  SLP Interventions    TR Interventions    SW/CM Interventions Discharge Planning, Psychosocial Support, Patient/Family Education   Barriers to Discharge MD  Medical stability and Home enviroment access/loayout  Nursing      PT Other (comments) Disease progression  OT      SLP      SW       Team Discharge Planning: Destination: PT-Home ,OT- Home , SLP-  Projected Follow-up: PT-Outpatient PT, OT-  Home health OT, SLP-  Projected Equipment Needs: PT-To be determined, OT- None recommended by OT, SLP-  Equipment Details: PT-Patient owns RW, power wheelchair, bed rails, electric lifts, ramp, OT-  Patient/family involved in discharge planning: PT- Patient, Family Midwife,  OT-Patient, Family member/caregiver, SLP-   MD ELOS: 10-14 days Medical Rehab Prognosis:  Good Assessment: The patient has been admitted for CIR therapies with the diagnosis of  L3 compression fracture s/p kyphoplasty . The team will be addressing functional mobility, strength, stamina, balance, safety, adaptive techniques and equipment, self-care, bowel and bladder mgt, patient and caregiver education. Goals have been set at sup/min A. Anticipated discharge destination is home.        See Team Conference Notes for weekly updates to the plan of care

## 2022-06-07 NOTE — Progress Notes (Signed)
PROGRESS NOTE   Subjective/Complaints: Working with therapy, walking in halls. Reports some discomfort in elbows when using walker.   ROS: denies CP,SOB, N/V + back pain-chronic, improves with standing +tremors Objective:   No results found. Recent Labs    06/04/22 1407 06/05/22 0714  WBC 5.6 4.5  HGB 14.7 13.6  HCT 43.1 41.7  PLT 282 242    Recent Labs    06/04/22 1407 06/05/22 0714  NA  --  137  K  --  3.9  CL  --  102  CO2  --  26  GLUCOSE  --  115*  BUN  --  8  CREATININE 0.44* 0.36*  CALCIUM  --  9.1     Intake/Output Summary (Last 24 hours) at 06/07/2022 0805 Last data filed at 06/07/2022 0804 Gross per 24 hour  Intake 598 ml  Output 550 ml  Net 48 ml         Physical Exam: Vital Signs Blood pressure (!) 126/94, pulse 72, temperature 98 F (36.7 C), resp. rate 18, height '5\' 11"'$  (1.803 m), weight 71.1 kg, SpO2 98 %.   General: Alert and oriented x 3, No apparent distress HEENT: Head is normocephalic, atraumatic, conjugate gaze, oral mucosa pink and moist Neck: Supple without JVD or lymphadenopathy Heart:  No murmurs rubs or gallops Chest: non-labored Abdomen: Soft, non-tender, non-distended, bowel sounds positive. Extremities: No clubbing, cyanosis, or edema. Pulses are 2+ Psych: A little flat Skin: Clean and intact without signs of breakdown Bandages over L spine C/D/I Musculoskeletal:     Cervical back: Neck supple. No tenderness.     Comments: RUE- deltoid 2/5; Biceps 2-/5; Triceps 2+/5; WE 5-/5; Grip 4+/5; FA 4-/5 LUE- same except FA 4+/5 RLE- HF 4-/5; KE 4+/5' DF 0/5; PF 4+/5 LLE- same except HF 4/5 Muscle atrophy in shoulder and hip girdles Adquate core strength No TTP across low back around compression fx  B/L elbow non tender to palpation, no significant pain with ROM  Neurological: Follows commands, RUE tremor related to PD  Assessment/Plan: 1. Functional deficits which  require 3+ hours per day of interdisciplinary therapy in a comprehensive inpatient rehab setting. Physiatrist is providing close team supervision and 24 hour management of active medical problems listed below. Physiatrist and rehab team continue to assess barriers to discharge/monitor patient progress toward functional and medical goals  Care Tool:  Bathing    Body parts bathed by patient: Right arm, Abdomen, Front perineal area, Right upper leg, Left upper leg, Face   Body parts bathed by helper: Left arm, Chest, Buttocks, Left lower leg, Right lower leg     Bathing assist Assist Level: Maximal Assistance - Patient 24 - 49%     Upper Body Dressing/Undressing Upper body dressing   What is the patient wearing?: Pull over shirt    Upper body assist Assist Level: Moderate Assistance - Patient 50 - 74%    Lower Body Dressing/Undressing Lower body dressing      What is the patient wearing?: Underwear/pull up, Pants     Lower body assist Assist for lower body dressing: 2 Helpers     Toileting Toileting    Toileting assist Assist  for toileting: 2 Helpers     Transfers Chair/bed transfer  Transfers assist  Chair/bed transfer activity did not occur: Safety/medical concerns  Chair/bed transfer assist level: Minimal Assistance - Patient > 75%     Locomotion Ambulation   Ambulation assist      Assist level: Minimal Assistance - Patient > 75% Assistive device: Walker-rolling Max distance: 100'   Walk 10 feet activity   Assist     Assist level: Minimal Assistance - Patient > 75% Assistive device: Walker-rolling   Walk 50 feet activity   Assist    Assist level: Minimal Assistance - Patient > 75% Assistive device: Walker-rolling    Walk 150 feet activity   Assist Walk 150 feet activity did not occur: Safety/medical concerns (Unable to ambulate >100' at this time secondary to impaired endurance/activity tolerance.)         Walk 10 feet on uneven  surface  activity   Assist Walk 10 feet on uneven surfaces activity did not occur: Safety/medical concerns (Patient has a mehanical lift chair at PLOF- Did not perform stair mobility, ramp, or ambulate over uneven surfaces prior to admission.)         Wheelchair     Assist Is the patient using a wheelchair?: Yes Type of Wheelchair: Manual (Patient has power wheelchair at home, however is only used in the community and not within the home.)    Wheelchair assist level: Dependent - Patient 0% Max wheelchair distance: Therapist propelled manual wheelchair for time management.    Wheelchair 50 feet with 2 turns activity    Assist        Assist Level: Dependent - Patient 0%   Wheelchair 150 feet activity     Assist      Assist Level: Dependent - Patient 0%   Blood pressure (!) 126/94, pulse 72, temperature 98 F (36.7 C), resp. rate 18, height '5\' 11"'$  (1.803 m), weight 71.1 kg, SpO2 98 %.  Medical Problem List and Plan: 1. Functional deficits secondary to L3 compression fracture.  Status post kyphoplasty 9/29.  Back brace when out of bed.  May remove brace to shower.             -patient may  shower             -ELOS/Goals: 10-14 days- supervision to min A due to FSHD  -Continue CIR  -PRAFO ordered for night use bilateral 2.  Antithrombotics: -DVT/anticoagulation:  Pharmaceutical: Heparin             -antiplatelet therapy: n/a 3. Pain Management/history of chronic severe neuropathic pain: Cymbalta 30 mg twice daily, Dilaudid 8 mg every 6 hours as needed- will change dilaudid to 8 mg QID so doesn't have to keep asking for it- takes scheduled at home.   -10/4 Start gabapentin '100mg'$  TID  -10/5 leg pain improved, continue gabapentin 4. Mood/Behavior/Sleep: Valium 10 mg twice daily as needed anxiety             -antipsychotic agents: N/A 5. Neuropsych/cognition: This patient is capable of making decisions on his own behalf. 6. Skin/Wound Care: Routine skin  checks 7. Fluids/Electrolytes/Nutrition: Routine in and outs with follow-up chemistries 8.  Parkinson's disease.  Sinemet 50-200 mg 3 times daily 9.  History of FSHD muscular dystrophy.  Follow-up [facility] clinic Dr. Tillman Abide 10.  Constipation.  Colace 100 mg nightly, MiraLAX daily, Chronulac as needed  -10/6 LBM 10/5 improved 11. FSHD- can use his home trilegy for breathing issues due to Muscular dystrophy  -  10/5 compliant with home machine last night 12. Low albumin  -10/6 eating 100% of meals 13. B/L elbow soreness, suspect related to increase use with therapy  -will start voltaren gel      LOS: 3 days A FACE TO FACE EVALUATION WAS PERFORMED  Jennye Boroughs 06/07/2022, 8:05 AM

## 2022-06-07 NOTE — Progress Notes (Signed)
Pt will place himself on and off of his home CPAP. RT will continue to monitor.

## 2022-06-08 DIAGNOSIS — R770 Abnormality of albumin: Secondary | ICD-10-CM | POA: Diagnosis not present

## 2022-06-08 DIAGNOSIS — S32030D Wedge compression fracture of third lumbar vertebra, subsequent encounter for fracture with routine healing: Secondary | ICD-10-CM | POA: Diagnosis not present

## 2022-06-08 DIAGNOSIS — G7102 Facioscapulohumeral muscular dystrophy: Secondary | ICD-10-CM | POA: Diagnosis not present

## 2022-06-08 DIAGNOSIS — M25521 Pain in right elbow: Secondary | ICD-10-CM | POA: Diagnosis not present

## 2022-06-08 DIAGNOSIS — G20A1 Parkinson's disease without dyskinesia, without mention of fluctuations: Secondary | ICD-10-CM

## 2022-06-08 NOTE — Progress Notes (Signed)
PROGRESS NOTE   Subjective/Complaints: Pt is in room with his wife. Reports he ambulation is improved and is able to work less bent forward. Elbow pain is improved with voltaren gel.   ROS: denies CP,SOB, N/V, HA + back pain-chronic, improves with standing +tremors Objective:   No results found. No results for input(s): "WBC", "HGB", "HCT", "PLT" in the last 72 hours.  No results for input(s): "NA", "K", "CL", "CO2", "GLUCOSE", "BUN", "CREATININE", "CALCIUM" in the last 72 hours.   Intake/Output Summary (Last 24 hours) at 06/08/2022 1116 Last data filed at 06/08/2022 0700 Gross per 24 hour  Intake 900 ml  Output 1250 ml  Net -350 ml         Physical Exam: Vital Signs Blood pressure 103/68, pulse 64, temperature 97.7 F (36.5 C), temperature source Oral, resp. rate 18, height '5\' 11"'$  (1.803 m), weight 71.1 kg, SpO2 98 %.   General: Alert and oriented x 3, No apparent distress HEENT: Head is normocephalic, atraumatic, conjugate gaze, oral mucosa pink and moist Neck: Supple without JVD or lymphadenopathy Heart:  No murmurs rubs or gallops Chest: non-labored Abdomen: Soft, non-tender, non-distended, bowel sounds positive. Extremities: No clubbing, cyanosis, or edema. Pulses are 2+ Psych: A little flat Skin: Clean and intact without signs of breakdown Bandages over L spine C/D/I Musculoskeletal:     Cervical back: Neck supple. No tenderness.  B/L elbow non tender to palpation, no significant pain with ROM Walking with walker, bent forward posture  From prior Exam    Comments: RUE- deltoid 2/5; Biceps 2-/5; Triceps 2+/5; WE 5-/5; Grip 4+/5; FA 4-/5 LUE- same except FA 4+/5 RLE- HF 4-/5; KE 4+/5' DF 0/5; PF 4+/5 LLE- same except HF 4/5 Muscle atrophy in shoulder and hip girdles Adquate core strength No TTP across low back around compression fx    Neurological: Follows commands, RUE tremor related to  PD  Assessment/Plan: 1. Functional deficits which require 3+ hours per day of interdisciplinary therapy in a comprehensive inpatient rehab setting. Physiatrist is providing close team supervision and 24 hour management of active medical problems listed below. Physiatrist and rehab team continue to assess barriers to discharge/monitor patient progress toward functional and medical goals  Care Tool:  Bathing    Body parts bathed by patient: Right arm, Abdomen, Front perineal area, Right upper leg, Left upper leg, Face   Body parts bathed by helper: Left arm, Chest, Buttocks, Left lower leg, Right lower leg     Bathing assist Assist Level: Maximal Assistance - Patient 24 - 49%     Upper Body Dressing/Undressing Upper body dressing   What is the patient wearing?: Pull over shirt    Upper body assist Assist Level: Moderate Assistance - Patient 50 - 74%    Lower Body Dressing/Undressing Lower body dressing      What is the patient wearing?: Underwear/pull up, Pants     Lower body assist Assist for lower body dressing: 2 Helpers     Toileting Toileting    Toileting assist Assist for toileting: 2 Helpers     Transfers Chair/bed transfer  Transfers assist  Chair/bed transfer activity did not occur: Safety/medical concerns  Chair/bed transfer assist  level: Minimal Assistance - Patient > 75%     Locomotion Ambulation   Ambulation assist      Assist level: Minimal Assistance - Patient > 75% Assistive device: Walker-rolling Max distance: 100'   Walk 10 feet activity   Assist     Assist level: Minimal Assistance - Patient > 75% Assistive device: Walker-rolling   Walk 50 feet activity   Assist    Assist level: Minimal Assistance - Patient > 75% Assistive device: Walker-rolling    Walk 150 feet activity   Assist Walk 150 feet activity did not occur: Safety/medical concerns (Unable to ambulate >100' at this time secondary to impaired  endurance/activity tolerance.)         Walk 10 feet on uneven surface  activity   Assist Walk 10 feet on uneven surfaces activity did not occur: Safety/medical concerns (Patient has a mehanical lift chair at PLOF- Did not perform stair mobility, ramp, or ambulate over uneven surfaces prior to admission.)         Wheelchair     Assist Is the patient using a wheelchair?: Yes Type of Wheelchair: Manual (Patient has power wheelchair at home, however is only used in the community and not within the home.)    Wheelchair assist level: Dependent - Patient 0% Max wheelchair distance: Therapist propelled manual wheelchair for time management.    Wheelchair 50 feet with 2 turns activity    Assist        Assist Level: Dependent - Patient 0%   Wheelchair 150 feet activity     Assist      Assist Level: Dependent - Patient 0%   Blood pressure 103/68, pulse 64, temperature 97.7 F (36.5 C), temperature source Oral, resp. rate 18, height '5\' 11"'$  (1.803 m), weight 71.1 kg, SpO2 98 %.  Medical Problem List and Plan: 1. Functional deficits secondary to L3 compression fracture.  Status post kyphoplasty 9/29.  Back brace when out of bed.  May remove brace to shower.             -patient may  shower             -ELOS/Goals: 10-14 days- supervision to min A due to FSHD  -Continue CIR  -PRAFO ordered for night use bilateral  -Toe up braces have been ordered 2.  Antithrombotics: -DVT/anticoagulation:  Pharmaceutical: Heparin             -antiplatelet therapy: n/a 3. Pain Management/history of chronic severe neuropathic pain: Cymbalta 30 mg twice daily, Dilaudid 8 mg every 6 hours as needed- will change dilaudid to 8 mg QID so doesn't have to keep asking for it- takes scheduled at home.   -10/4 Start gabapentin '100mg'$  TID  -10/5 leg pain improved, continue gabapentin 4. Mood/Behavior/Sleep: Valium 10 mg twice daily as needed anxiety             -antipsychotic agents: N/A 5.  Neuropsych/cognition: This patient is capable of making decisions on his own behalf. 6. Skin/Wound Care: Routine skin checks 7. Fluids/Electrolytes/Nutrition: Routine in and outs with follow-up chemistries 8.  Parkinson's disease. RUE tremor. Sinemet 50-200 mg 3 times daily 9.  History of FSHD muscular dystrophy.  Follow-up [facility] Dr. Tillman Abide 10.  Constipation.  Colace 100 mg nightly, MiraLAX daily, Chronulac as needed  -10/6 LBM 10/5 improved 11. FSHD- can use his home trilegy for breathing issues due to Muscular dystrophy  -continues to be complaint with trilogy use 12. Low albumin, continue ensure supplement  -10/6 eating  100% of meals 13. B/L elbow soreness, suspect related to increase use with therapy  -improved with voltaren      LOS: 4 days A FACE TO FACE EVALUATION WAS PERFORMED  Jennye Boroughs 06/08/2022, 11:16 AM

## 2022-06-09 DIAGNOSIS — R770 Abnormality of albumin: Secondary | ICD-10-CM | POA: Diagnosis not present

## 2022-06-09 DIAGNOSIS — S32030D Wedge compression fracture of third lumbar vertebra, subsequent encounter for fracture with routine healing: Secondary | ICD-10-CM | POA: Diagnosis not present

## 2022-06-09 DIAGNOSIS — K5900 Constipation, unspecified: Secondary | ICD-10-CM | POA: Diagnosis not present

## 2022-06-09 DIAGNOSIS — M25521 Pain in right elbow: Secondary | ICD-10-CM | POA: Diagnosis not present

## 2022-06-09 NOTE — Progress Notes (Signed)
Occupational Therapy Session Note  Patient Details  Name: [name]. MRN: [id number] Date of Birth: 1955-09-25  Today's Date: 06/09/2022 OT Individual Time: 1100-1200 OT Individual Time Calculation (min): 60 min    Short Term Goals: Week 1:  OT Short Term Goal 1 (Week 1): STG= LTG d/t ELOS Week 2:     Skilled Therapeutic Interventions/Progress Updates:    The patient  was sitting EOB with family present at the time of skilled OT treatment. The pt was able to ambulate within the confines of his room using the RW for additional balance.  The pt presented as kyphotic in relation to his posture, his stance was offset secondary to  a wide exaggerated gate characterized as a steppage pattern.  The pt was able to simulate UB dressing using medium grade theraband with initial verbal cues and demonstration, the pt was able to execute the task with SBA.  The pt completed towel exercises for shld flexion, horizontal abduction, and shld rotation 2 sets of 10 with  rest breaks as needed, the pt required 1 rest break between set..  The pt was instructed in using the yellow theraband for shld flexion and chest press 2 sets of 10.  The pt returned to EOB at the end of the session with his family present and his bedside table placed in front of him for lunch.  The call light was within reach and all additional needs were addressed.  The pt had no report of pain during this treatment session.   Therapy Documentation Precautions:  Precautions Precautions: Back Precaution Comments: Kyphoplasty 05/31/22 Required Braces or Orthoses: Spinal Brace Spinal Brace: Thoracolumbosacral orthotic Restrictions Weight Bearing Restrictions: No Therapy/Group: Individual Therapy  Yvonne Kendall 06/09/2022, 3:14 PM

## 2022-06-09 NOTE — Progress Notes (Signed)
Physical Therapy Session Note  Patient Details  Name: Luis [name]. MRN: [id number] Date of Birth: 09/28/1955  Today's Date: 06/09/2022 PT Individual Time: 1353-1450 PT Individual Time Calculation (min): 57 min   Short Term Goals: Week 1:  PT Short Term Goal 1 (Week 1): STG=LTG secondary to ELOS  Skilled Therapeutic Interventions/Progress Updates: Pt presented perched at EOB with wife present agreeable to therapy. Pt states pain controlled at moment, but did c/o increased L elbow and B shoulder pain. Rest breaks provided as needed and ambulation deferred at end of session. Pt ambulated to day room with RW and CGA. Pt noted to have improved trunk control this session as compared to when last worked with this therapist. Pt did require x 3 standing rest breaks due to fatigue of BUE. Pt then participated in perched therex as follows: LAQ with 1.5lb cuff, hip flexion with 1.5lb cuff 2 x 10, back extension 2 x 10, modified Sit to stand from perch without AD 2 x 10. PTA also obtained second w/c cushion to facilitate stand from w/c. Pt was able to ambulate to nsg station ~75ft and was able to maintain more upright posture without rest break. Pt transported remaining distance back to room and stood from w/c with +2 assistance and ambulated to EOB. Pt left at EOB with wife and friends present and current needs met.      Therapy Documentation Precautions:  Precautions Precautions: Back Precaution Comments: Kyphoplasty 05/31/22 Required Braces or Orthoses: Spinal Brace Spinal Brace: Thoracolumbosacral orthotic Restrictions Weight Bearing Restrictions: No General:   Vital Signs: Therapy Vitals Temp: 97.9 F (36.6 C) Temp Source: Oral Pulse Rate: 64 Resp: 16 BP: 100/67 Patient Position (if appropriate): Sitting Oxygen Therapy SpO2: 98 % O2 Device: Room Air Pain:   Mobility:   Locomotion :    Trunk/Postural Assessment :    Balance:   Exercises:   Other Treatments:       Therapy/Group: Individual Therapy  Rosita DeChalus 06/09/2022, 3:56 PM  

## 2022-06-09 NOTE — Progress Notes (Signed)
Physical Therapy Session Note  Patient Details  Name: Luis [name] [name]. MRN: [id number] Date of Birth: 08/31/1956  Today's Date: 06/09/2022 PT Individual Time: 0802-0916 PT Individual Time Calculation (min): 74 min   Short Term Goals: Week 1:  PT Short Term Goal 1 (Week 1): STG=LTG secondary to ELOS  Skilled Therapeutic Interventions/Progress Updates:      Therapy Documentation Precautions:  Precautions Precautions: Back Precaution Comments: Kyphoplasty 05/31/22 Required Braces or Orthoses: Spinal Brace Spinal Brace: Thoracolumbosacral orthotic Restrictions Weight Bearing Restrictions: No  Pt received standing edge of bed with spouse present and agreeable to PT. Pt reports he enjoys swimming and PT recommended pt receive outpatient aquatic therapy and pt agreeable. Pt reports 8-9/10 back pain, nursing notified and adminstred pain medication during session. Pt SBA with stand pivot with RW to w/c and transported by w/c to main gym for time management and energy conservation. Pt mod A with sit to stand transfer in parallel bars. PT session with emphasis on postural control, standing activity tolerance and LE strengthening. Pt demonstrates improved standing posture with gluteal and trunk tactile cueing and performed standing gluteal sets 3 x 5, 3 second holds. Pt transitioned to standing resisted hip abduction with bilateral UE support on bar with green theraband 3 x 10. Pt requires verbal cueing for upright posture with activity. Pt performed resisted green theraband terminal knee extension bilaterally 2 x 10 to address knee instability and buckling in standing. Pt ambulated ~15 ft in parallel bars and requires verbal cueing for posturing. Pt transported by w/c to room and left standing edge of bed with spouse present and with all needs in reach.    Therapy/Group: Individual Therapy  Verl Dicker Verl Dicker PT, DPT  06/09/2022, 7:40 AM

## 2022-06-09 NOTE — Progress Notes (Signed)
PROGRESS NOTE   Subjective/Complaints: Pt is in room at beside. Wife is in room also. Pt is not listed as NDR in chart however however patient and wife confirm that he is DNR and would like to continue to be DNR. No additional concerns.   ROS: denies CP,SOB, N/V, HA + back pain-chronic, improves with standing +tremors Objective:   No results found. No results for input(s): "WBC", "HGB", "HCT", "PLT" in the last 72 hours.  No results for input(s): "NA", "K", "CL", "CO2", "GLUCOSE", "BUN", "CREATININE", "CALCIUM" in the last 72 hours.   Intake/Output Summary (Last 24 hours) at 06/09/2022 1300 Last data filed at 06/09/2022 0736 Gross per 24 hour  Intake 360 ml  Output 200 ml  Net 160 ml         Physical Exam: Vital Signs Blood pressure 102/72, pulse 64, temperature 97.6 F (36.4 C), resp. rate 18, height '5\' 11"'$  (1.803 m), weight 71.1 kg, SpO2 98 %.   General: Alert and oriented x 3, No apparent distress HEENT: Head is normocephalic, atraumatic, conjugate gaze, oral mucosa pink and moist Neck: Supple without JVD or lymphadenopathy Heart:  RRR, No murmurs rubs or gallops Chest: CTAB, non-labored Abdomen: Soft, non-tender, non-distended, bowel sounds positive. Extremities: No clubbing, cyanosis, or edema. Pulses are 2+ Psych: A little flat Skin: Clean and intact without signs of breakdown Bandages over L spine C/D/I Musculoskeletal:     Cervical back: Neck supple. No tenderness.  B/L elbow non tender to palpation, no significant pain with ROM   From prior Exam    Comments: RUE- deltoid 2/5; Biceps 2-/5; Triceps 2+/5; WE 5-/5; Grip 4+/5; FA 4-/5 LUE- same except FA 4+/5 RLE- HF 4-/5; KE 4+/5' DF 0/5; PF 4+/5 LLE- same except HF 4/5 Muscle atrophy in shoulder and hip girdles Adquate core strength No TTP across low back around compression fx    Neurological: Follows commands, RUE tremor related to  PD  Assessment/Plan: 1. Functional deficits which require 3+ hours per day of interdisciplinary therapy in a comprehensive inpatient rehab setting. Physiatrist is providing close team supervision and 24 hour management of active medical problems listed below. Physiatrist and rehab team continue to assess barriers to discharge/monitor patient progress toward functional and medical goals  Care Tool:  Bathing    Body parts bathed by patient: Right arm, Abdomen, Front perineal area, Right upper leg, Left upper leg, Face   Body parts bathed by helper: Left arm, Chest, Buttocks, Left lower leg, Right lower leg     Bathing assist Assist Level: Maximal Assistance - Patient 24 - 49%     Upper Body Dressing/Undressing Upper body dressing   What is the patient wearing?: Pull over shirt    Upper body assist Assist Level: Moderate Assistance - Patient 50 - 74%    Lower Body Dressing/Undressing Lower body dressing      What is the patient wearing?: Underwear/pull up, Pants     Lower body assist Assist for lower body dressing: 2 Helpers     Toileting Toileting    Toileting assist Assist for toileting: 2 Helpers     Transfers Chair/bed transfer  Transfers assist  Chair/bed transfer activity did not  occur: Safety/medical concerns  Chair/bed transfer assist level: Minimal Assistance - Patient > 75%     Locomotion Ambulation   Ambulation assist      Assist level: Minimal Assistance - Patient > 75% Assistive device: Walker-rolling Max distance: 100'   Walk 10 feet activity   Assist     Assist level: Minimal Assistance - Patient > 75% Assistive device: Walker-rolling   Walk 50 feet activity   Assist    Assist level: Minimal Assistance - Patient > 75% Assistive device: Walker-rolling    Walk 150 feet activity   Assist Walk 150 feet activity did not occur: Safety/medical concerns (Unable to ambulate >100' at this time secondary to impaired  endurance/activity tolerance.)         Walk 10 feet on uneven surface  activity   Assist Walk 10 feet on uneven surfaces activity did not occur: Safety/medical concerns (Patient has a mehanical lift chair at PLOF- Did not perform stair mobility, ramp, or ambulate over uneven surfaces prior to admission.)         Wheelchair     Assist Is the patient using a wheelchair?: Yes Type of Wheelchair: Manual (Patient has power wheelchair at home, however is only used in the community and not within the home.)    Wheelchair assist level: Dependent - Patient 0% Max wheelchair distance: Therapist propelled manual wheelchair for time management.    Wheelchair 50 feet with 2 turns activity    Assist        Assist Level: Dependent - Patient 0%   Wheelchair 150 feet activity     Assist      Assist Level: Dependent - Patient 0%   Blood pressure 102/72, pulse 64, temperature 97.6 F (36.4 C), resp. rate 18, height '5\' 11"'$  (1.803 m), weight 71.1 kg, SpO2 98 %.  Medical Problem List and Plan: 1. Functional deficits secondary to L3 compression fracture.  Status post kyphoplasty 9/29.  Back brace when out of bed.  May remove brace to shower.             -patient may  shower             -ELOS/Goals: 10-14 days- supervision to min A due to FSHD  -Continue CIR  -PRAFO ordered for night use bilateral  -Toe up braces have been ordered  -DNR orders placed per patient request  -Therapy considering aquatic therapy as good outpatient option 2.  Antithrombotics: -DVT/anticoagulation:  Pharmaceutical: Heparin             -antiplatelet therapy: n/a 3. Pain Management/history of chronic severe neuropathic pain: Cymbalta 30 mg twice daily, Dilaudid 8 mg every 6 hours as needed- will change dilaudid to 8 mg QID so doesn't have to keep asking for it- takes scheduled at home.   -10/4 Start gabapentin '100mg'$  TID  -10/5 leg pain improved, continue gabapentin  10/8 Reports leg pain continues  to be improved 4. Mood/Behavior/Sleep: Valium 10 mg twice daily as needed anxiety             -antipsychotic agents: N/A 5. Neuropsych/cognition: This patient is capable of making decisions on his own behalf. 6. Skin/Wound Care: Routine skin checks 7. Fluids/Electrolytes/Nutrition: Routine in and outs with follow-up chemistries 8.  Parkinson's disease. RUE tremor. Sinemet 50-200 mg 3 times daily 9.  History of FSHD muscular dystrophy.  Follow-up [facility] Dr. Tillman Abide 10.  Constipation.  Colace 100 mg nightly, MiraLAX daily, Chronulac as needed  -10/8 LBM today, improved 11. FSHD-  can use his home trilegy for breathing issues due to Muscular dystrophy  -continues to be complaint with trilogy use 12. Low albumin, continue ensure supplement  -10/8 eating most of his meals 13. B/L elbow soreness, suspect related to increase use with therapy  -10/8 using voltaren with improvement      LOS: 5 days A FACE TO FACE EVALUATION WAS PERFORMED  Jennye Boroughs 06/09/2022, 1:00 PM

## 2022-06-10 DIAGNOSIS — S32000S Wedge compression fracture of unspecified lumbar vertebra, sequela: Secondary | ICD-10-CM | POA: Diagnosis not present

## 2022-06-10 DIAGNOSIS — M7022 Olecranon bursitis, left elbow: Secondary | ICD-10-CM | POA: Diagnosis not present

## 2022-06-10 DIAGNOSIS — S32030D Wedge compression fracture of third lumbar vertebra, subsequent encounter for fracture with routine healing: Secondary | ICD-10-CM | POA: Diagnosis not present

## 2022-06-10 DIAGNOSIS — G7102 Facioscapulohumeral muscular dystrophy: Secondary | ICD-10-CM | POA: Diagnosis not present

## 2022-06-10 NOTE — Progress Notes (Signed)
PROGRESS NOTE   Subjective/Complaints: Pt up standing with walker at EOB. Wife in room. Very happy with their experience on inpatient rehab  ROS: Patient denies fever, rash, sore throat, blurred vision, dizziness, nausea, vomiting, diarrhea, cough, shortness of breath or chest pain,  headache, or mood change.  Objective:   No results found. No results for input(s): "WBC", "HGB", "HCT", "PLT" in the last 72 hours.  No results for input(s): "NA", "K", "CL", "CO2", "GLUCOSE", "BUN", "CREATININE", "CALCIUM" in the last 72 hours.   Intake/Output Summary (Last 24 hours) at 06/10/2022 1103 Last data filed at 06/10/2022 1052 Gross per 24 hour  Intake 1193 ml  Output 1650 ml  Net -457 ml        Physical Exam: Vital Signs Blood pressure 111/77, pulse (!) 54, temperature 98.2 F (36.8 C), temperature source Oral, resp. rate 18, height '5\' 11"'$  (1.803 m), weight 71.1 kg, SpO2 99 %.  Constitutional: No distress . Vital signs reviewed. HEENT: NCAT, EOMI, oral membranes moist Neck: supple Cardiovascular: RRR without murmur. No JVD    Respiratory/Chest: CTA Bilaterally without wheezes or rales. Normal effort    GI/Abdomen: BS +, non-tender, non-distended Ext: no clubbing, cyanosis, or edema Psych: pleasant and cooperative  Skin: Clean and intact without signs of breakdown Bandages over L spine C/D/I Musculoskeletal:     Cervical back: Neck supple. No tenderness.  Left olecranon bursa with mild swelling, mild pain   From prior Exam    Comments: RUE- deltoid 2/5; Biceps 2-/5; Triceps 2+/5; WE 5-/5; Grip 4+/5; FA 4-/5 LUE- same except FA 4+/5 RLE- HF 4-/5; KE 4+/5' DF 0/5; PF 4+/5 LLE- same except HF 4/5---no changes in motor or posture Muscle atrophy in shoulder and hip girdles Adquate core strength LB-MB with minimal pain with palpation   Neurological: Alert and oriented x 3. Normal insight and awareness. Intact Memory. Normal  language and speech. Cranial nerve exam unremarkable. Resting tremor RUE.  Assessment/Plan: 1. Functional deficits which require 3+ hours per day of interdisciplinary therapy in a comprehensive inpatient rehab setting. Physiatrist is providing close team supervision and 24 hour management of active medical problems listed below. Physiatrist and rehab team continue to assess barriers to discharge/monitor patient progress toward functional and medical goals  Care Tool:  Bathing    Body parts bathed by patient: Right arm, Abdomen, Front perineal area, Right upper leg, Left upper leg, Face   Body parts bathed by helper: Left arm, Chest, Buttocks, Left lower leg, Right lower leg     Bathing assist Assist Level: Maximal Assistance - Patient 24 - 49%     Upper Body Dressing/Undressing Upper body dressing   What is the patient wearing?: Pull over shirt    Upper body assist Assist Level: Moderate Assistance - Patient 50 - 74%    Lower Body Dressing/Undressing Lower body dressing      What is the patient wearing?: Underwear/pull up, Pants     Lower body assist Assist for lower body dressing: 2 Helpers     Toileting Toileting    Toileting assist Assist for toileting: Minimal Assistance - Patient > 75% (urinal)     Transfers Chair/bed transfer  Transfers assist  Chair/bed transfer activity did not occur: Safety/medical concerns  Chair/bed transfer assist level: Minimal Assistance - Patient > 75%     Locomotion Ambulation   Ambulation assist      Assist level: Minimal Assistance - Patient > 75% Assistive device: Walker-rolling Max distance: 100'   Walk 10 feet activity   Assist     Assist level: Minimal Assistance - Patient > 75% Assistive device: Walker-rolling   Walk 50 feet activity   Assist    Assist level: Minimal Assistance - Patient > 75% Assistive device: Walker-rolling    Walk 150 feet activity   Assist Walk 150 feet activity did not  occur: Safety/medical concerns (Unable to ambulate >100' at this time secondary to impaired endurance/activity tolerance.)         Walk 10 feet on uneven surface  activity   Assist Walk 10 feet on uneven surfaces activity did not occur: Safety/medical concerns (Patient has a mehanical lift chair at PLOF- Did not perform stair mobility, ramp, or ambulate over uneven surfaces prior to admission.)         Wheelchair     Assist Is the patient using a wheelchair?: Yes Type of Wheelchair: Manual (Patient has power wheelchair at home, however is only used in the community and not within the home.)    Wheelchair assist level: Dependent - Patient 0% Max wheelchair distance: Therapist propelled manual wheelchair for time management.    Wheelchair 50 feet with 2 turns activity    Assist        Assist Level: Dependent - Patient 0%   Wheelchair 150 feet activity     Assist      Assist Level: Dependent - Patient 0%   Blood pressure 111/77, pulse (!) 54, temperature 98.2 F (36.8 C), temperature source Oral, resp. rate 18, height '5\' 11"'$  (1.803 m), weight 71.1 kg, SpO2 99 %.  Medical Problem List and Plan: 1. Functional deficits secondary to L3 compression fracture.  Status post kyphoplasty 9/29.  Back brace when out of bed.  May remove brace to shower.             -patient may  shower             -ELOS/Goals: 10-14 days- supervision to min A due to FSHD  -Continue CIR therapies including PT, OT   -PRAFO ordered for night use bilateral  -Toe up braces have been ordered  -DNR orders placed per patient request  -Therapy considering aquatic therapy as good outpatient option 2.  Antithrombotics: -DVT/anticoagulation:  Pharmaceutical: Heparin             -antiplatelet therapy: n/a 3. Pain Management/history of chronic severe neuropathic pain: Cymbalta 30 mg twice daily, Dilaudid 8 mg every 6 hours as needed- will change dilaudid to 8 mg QID so doesn't have to keep asking  for it- takes scheduled at home.   -10/4 Started gabapentin '100mg'$  TID  -10/5 leg pain improved, continue gabapentin  10/9 Reports leg pain continues to be improved 4. Mood/Behavior/Sleep: Valium 10 mg twice daily as needed anxiety             -antipsychotic agents: N/A 5. Neuropsych/cognition: This patient is capable of making decisions on his own behalf. 6. Skin/Wound Care: Routine skin checks 7. Fluids/Electrolytes/Nutrition: Routine in and outs with follow-up chemistries 8.  Parkinson's disease. RUE tremor. Sinemet 50-200 mg 3 times daily 9.  History of FSHD muscular dystrophy.  Follow-up [facility] Dr. Tillman Abide 10.  Constipation.  Colace 100  mg nightly, MiraLAX daily, Chronulac as needed  -10/9 LBM today, improved 11. FSHD- can use his home trilegy for breathing issues due to Muscular dystrophy  -continues to be complaint with trilogy use  -at baseline 12. Low albumin, continue ensure supplement  -10/9 eating well 13. B/L elbow soreness, suspect related to increase use with therapy  -10/8 using voltaren with improvement   10/9 has had left elbow aspirated before--no need for that now   -continue voltaren, add ice, improve mechanics    LOS: 6 days A FACE TO FACE EVALUATION WAS PERFORMED  Meredith Staggers 06/10/2022, 11:03 AM

## 2022-06-10 NOTE — Progress Notes (Signed)
Pt has home bipap and states that he took his bipap home and is done using it while in the hospital.

## 2022-06-10 NOTE — Progress Notes (Signed)
Physical Therapy Session Note  Patient Details  Name: Luis [name]. MRN: [id number] Date of Birth: Nov 05, 1955  Today's Date: 06/10/2022 PT Individual Time: 1st Treatment Session: 1100-1200; 2nd Treatment Session: 9678-9381 PT Individual Time Calculation (min): 60 min; 75 min  Short Term Goals: Week 1:  PT Short Term Goal 1 (Week 1): STG=LTG secondary to ELOS  Skilled Therapeutic Interventions/Progress Updates:  1st Treatment Session- Patient greeted perched on elevated bed with spouse present and agreeable to PT treatment session. Patient's spouse, [name], discussed miscommunications that occurred over the weekend and updated therapist on new braces, etc.   Patient gait trained x110' in hallway with RW and Supv for safety- [name] transporting wheelchair in hallway for patient to use once fatigued. Patient required x3 standing supported rest breaks along the wall in the hallway secondary to fatigue. Toward the end of gait trial, patient demonstrated significant trunk flexion secondary to fatigue leading to resting B forearms on the walker with trunk flexed at 90 degrees.   Patient wheeled the rest of the way to the gym via wheelchair for time management.  Patient performed slideboard transfer from wheelchair to sitting EOB with Supv for safety and therapist placing and stabilizing board throughout transfer. Patient using slideboard transfer at this time secondary to not standing from low surfaces at baseline.   Seated Therex performed in order to increase B LE strength for improved functional mobility- B LAQ with 2#, 3 x 10 with 3 second hold  B hip adduction with ball, 3 x 10 B hip abduction with red TB, 3 x 10- VC for increased activation of R LE.   Patient performed sit/stand from elevated mat with RW and Supv- Patient then performed stand pivot transfer from mat table to wheelchair with Supv.   Once returned to his room, patient stood from wheelchair with RW and Mod/MaxA for  initiating standing. Patient left perched on elevated bed with spouse present, call bell within reach, ice applied to L elbow and all needs met.    2nd Treatment Session- Patient greeted perched on elevated EOB with spouse present and agreeable to PT treatment session.  Patient gait trained x60' in hallway with RW and Supv- Patient reporting fatigue this afternoon and demonstrated increased flexed posturing limiting his ability to ambulate further this afternoon. Patient wheeled the rest of the way to the rehab gym for time management.   Patient performed slideboard transfer from wheelchair to sitting EOB with Supv for safety- Therapist placing and stabilizing slideboard.   Supine therex performed in order to increased B LE strength for improved independence with functional mobility- B SAQ with 4#, 3 x 10 with 3 second hold Sidelying clamshells, 4 x 10 with 3# on L LE Mini-Bridges, 3 x 10 with therapist facilitating neutral alignment of hips B LE roll-outs with small Swiss ball, 3 x 10 with therapist facilitating neutral alignment of hips- VC for pushing heels into the ball in order to activate hamstrings  Patient was able to roll onto each side while supine with CGA for guiding without the use of a bed rail (which patient has at home). Patient was able to transition from sitting EOB to supine with MinA for R LE management and Supv for transitioning from supine to sitting EOB with RW simulating a bed rail.   Patient performed sit/stand with RW from elevated mat table with supv for safety. Patient then ambulated 20' with RW to his wheelchair with supv for safety- increased flexion noted secondary to fatigue.  Once in his room, patient stood from wheelchair with RW and Mod/MaxA for initiating standing and performed stand pivot to EOB where he stood perched with bed elevated.  Patient left perched on the EOB with wife present and all needs met.   Therapy Documentation Precautions:   Precautions Precautions: Back Precaution Comments: Kyphoplasty 05/31/22 Required Braces or Orthoses: Spinal Brace Spinal Brace: Thoracolumbosacral orthotic Restrictions Weight Bearing Restrictions: No  Therapy/Group: Individual Therapy  Manvi Guilliams 06/10/2022, 12:30 PM

## 2022-06-10 NOTE — Progress Notes (Signed)
Occupational Therapy Session Note  Patient Details  Name: Luis [name] [name]. MRN: [id number] Date of Birth: 01-24-1956  Today's Date: 06/10/2022 OT Individual Time: 7944-4619 OT Individual Time Calculation (min): 71 min    Short Term Goals: Week 1:  OT Short Term Goal 1 (Week 1): STG= LTG d/t ELOS  Skilled Therapeutic Interventions/Progress Updates:    Pt received sitting EOB with moderate c/o pain in his back and L elbow (now slightly swollen- report they will notify MD on rounds) but agreeable to OT session and repositioning used as pain relief. He completed sit > stand with (S) from very elevated EOB. (S) 40 ft of functional mobility with the RW before requiring a perched rest break leaning against the wall. Improved ability to be more upright today. He completed another 50 ft before OT encouraged him to conserve his energy and ride the w/c the remainder of the way to the gym. Discussed energy conservation in the setting of muscular dystrophy. He completed a slideboard transfer from the w/c to the mat with CGA. He transitioned into supine on the mat with mod A to manage LE. He completed 3 sets of 10 glute bridges with anterior pelvic tuck compensation and OT facilitation laterally at the knees to prevent excessive abduction at the hips. He then completed hip adduction exercises with a ball with minimal activation but improvement from last week. These were performed to facilitate safe ADL transfers and strengthening of the BLE . He completed a transfer to the EOM with (S) d/t urgent urinary needs. OT assisted in use of urinal, managing clothing while he remained seated. He stood from the mat following with mod I and completed 20 ft of functional mobility at baseline mod I level before needing a seated rest break. He was taken via w/c to the room. He required mod +2 assist to stand from low w/c and then transferred to EOB. Discussed f/u therapy and HEP with his supportive wife [place]. Pt left sitting EOB,  all needs met.   Therapy Documentation Precautions:  Precautions Precautions: Back Precaution Comments: Kyphoplasty 05/31/22 Required Braces or Orthoses: Spinal Brace Spinal Brace: Thoracolumbosacral orthotic Restrictions Weight Bearing Restrictions: No  Therapy/Group: Individual Therapy  Curtis Sites 06/10/2022, 6:27 AM

## 2022-06-10 NOTE — Consult Note (Signed)
Neuropsychological Consultation   Patient:   Luis Ardoin Miralles Jr.   DOB:   1956-05-09  MR Number:  [id number]  Location:  [facility] [place] [id number] [place] [address] Dept: [place]: [phone]           Date of Service:   06/10/2022  Start Time:   10 AM End Time:   11 AM  Provider/Observer:  Ilean Skill, Psy.D.       Clinical Neuropsychologist       Billing Code/Service: [phone]  Chief Complaint:    Luis Mezera Haugan [name]. is a 66 year old male with past history of Parkinson's disease being maintained on Sinemet.  Patient also has chronic severe neuropathic pain, FS HD muscular dystrophy being followed by Dr. Tillman Abide MD.  Patient presented on 05/27/2022 after mechanical fall with increased pain in lumbar region.  MRI revealed acute compression fracture of L3 with approximately 40% vertebral body height loss.  Patient underwent L3 balloon kyphoplasty on 9/29 per interventional radiology.  Patient referred to comprehensive inpatient rehabilitation due to decreased functional mobility.  Reason for Service:  Patient was referred for neuropsychological consultation due to ongoing hospitalization after mechanical fall in the setting of an individual with Parkinson's disease and muscular dystrophy.  Below is the HPI for the current admission.  HPI: Luis [name] is a 66 year old right-handed male with with history of Parkinson's disease maintained on Sinemet, chronic severe neuropathic pain, FSHD muscular dystrophy followed by Dr. [name].  Per chart review patient lives with spouse.  Uses a rolling walker for mobility.  He had been wearing AFOs for bilateral foot drop but did not feel they were effective.  Presented 05/27/2022 after mechanical fall with increased pain in the lumbar region.  He does have a history of prior kyphoplasty at L5 01/10/2020.  MRI images revealed acute  compression fracture of L3 with approximately 40% vertebral body height loss.  Unchanged compression deformity of L5.  Underwent L3 balloon kyphoplasty 05/31/2022 per interventional radiology Dr.De Sindy Messing.  He was ordered a thoracolumbosacral orthotic brace but had trouble tolerating the brace.  He remains on Sinemet as prior to admission for history of Parkinson's disease.  Placed on subcutaneous heparin for DVT prophylaxis.  Maintained on Dilaudid for pain control at home due to painful Muscular dystrophy.. Therapy evaluations completed due to patient decreased functional mobility was admitted for a comprehensive rehab program.  Current Status:  Patient was awake and alert standing leaning against his bed when I entered the room.  Patient is reported to do this because of pain.  Patient with clear motor tremor consistent with Parkinson's disease.  Patient's wife also present in the room.  She did a lot of the talking during visit today.  Patient had increasingly lost motor function and capacity, which likely played a role in his fall.  Previous falls present.  Patient has been treated by Dr. Gaynell Face for many years due to his muscular dystrophy and now the development of Parkinson's disease.  Patient with good cognition.  Patient reports that he has made significant gains during comprehensive inpatient rehabilitation services and is expected to be discharged in 2 days.  Patient has signed a DNR but this has to do with his longstanding significant medical issues that he has been dealing with and not an indication that he has given up working on functional gains etc.  Patient's wife provides care wherever she can  and the patient's plan is to discharge home after completing rehab services.  Patient does acknowledge frustration with significant medical issues but he has been dealing with this for decades now.  Behavioral Observation: Luis Turvey Fenster [name].  presents as a 66 y.o.-year-old Right handed Caucasian  Male who appeared his stated age. his dress was Appropriate and he was Well Groomed and his manners were Appropriate to the situation.  his participation was indicative of Appropriate and Attentive behaviors.  There were physical disabilities noted.  he displayed an appropriate level of cooperation and motivation.     Interactions:    Active Appropriate  Attention:   within normal limits and attention span and concentration were age appropriate  Memory:   within normal limits; recent and remote memory intact  Visuo-spatial:  not examined  Speech (Volume):  low  Speech:   normal; normal  Thought Process:  Coherent and Relevant  Though Content:  WNL; not suicidal and not homicidal  Orientation:   person, place, time/date, and situation  Judgment:   Fair  Planning:   Fair  Affect:    Blunted  Mood:    Dysphoric  Insight:   Good  Intelligence:   high  Medical History:   Past Medical History:  Diagnosis Date   Bilateral foot-drop 10/19/2018   Bursitis, trochanteric    Episodic   Carpal tunnel syndrome on right    Degenerative disk disease    l5-S1   FSH (facioscapulohumeral muscular dystrophy) ([place]) 10/07/2017   Gait abnormality 10/19/2018   GERD (gastroesophageal reflux disease)    Hemorrhoids    with anal fissures   Left lateral epicondylitis    Parkinson's disease 10/19/2018   Skin cancer    right temple area         Patient Active Problem List   Diagnosis Date Noted   Low serum albumin    Lumbar compression fracture ([place]) 06/04/2022   Compression fracture of lumbar vertebra ([place]) 05/27/2022   Other chronic pain 10/18/2021   Chronic constipation 10/18/2021   Gait abnormality 10/19/2018   Bilateral foot-drop 10/19/2018   Parkinson's disease 10/19/2018   Swelling of lower leg - Right  03/06/2018   Tremor of right hand 12/19/2017   FSH (facioscapulohumeral muscular dystrophy) ([place]) 10/07/2017   Dilated cardiomyopathy ([place]) 05/02/2017   Upper airway cough  syndrome 03/18/2014   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  03/17/2014   Abnormal chest xray 03/16/2014     Psychiatric History:  No prior psychiatric history  Family Med/Psych History:  Family History  Problem Relation Age of Onset   Allergies Brother    Allergies Sister    Heart disease Father    Brain cancer Mother    Bone cancer Brother     Impression/DX:  Simone Tuckey Pavlov [name]. is a 66 year old male with past history of Parkinson's disease being maintained on Sinemet.  Patient also has chronic severe neuropathic pain, FS HD muscular dystrophy being followed by Dr. Tillman Abide MD.  Patient presented on 05/27/2022 after mechanical fall with increased pain in lumbar region.  MRI revealed acute compression fracture of L3 with approximately 40% vertebral body height loss.  Patient underwent L3 balloon kyphoplasty on 9/29 per interventional radiology.  Patient referred to comprehensive inpatient rehabilitation due to decreased functional mobility.  Patient was awake and alert standing leaning against his bed when I entered the room.  Patient is reported to do this because of pain.  Patient with clear motor tremor consistent with Parkinson's disease.  Patient's  wife also present in the room.  She did a lot of the talking during visit today.  Patient had increasingly lost motor function and capacity, which likely played a role in his fall.  Previous falls present.  Patient has been treated by Dr. Gaynell Face for many years due to his muscular dystrophy and now the development of Parkinson's disease.  Patient with good cognition.  Patient reports that he has made significant gains during comprehensive inpatient rehabilitation services and is expected to be discharged in 2 days.  Patient has signed a DNR but this has to do with his longstanding significant medical issues that he has been dealing with and not an indication that he has given up working on functional gains etc.  Patient's wife provides care wherever she can  and the patient's plan is to discharge home after completing rehab services.  Patient does acknowledge frustration with significant medical issues but he has been dealing with this for decades now.  Disposition/Plan:  Patient and wife very pleased that he was able to get into the inpatient rehab unit and is made significant functional gains but continues with significant motor deficits, tremors.  Some longstanding issues related to his muscular dystrophy remain.  Patient has been walking on the unit with a walker as an assistive device and has been increasing stamina.  Patient continues to stand often because of significant pain.  Diagnosis:    FSH (facioscapulohumeral muscular dystrophy) ([place]) - Plan: Ambulatory referral to Physical Medicine Rehab         Electronically Signed   _______________________ Ilean Skill, Psy.D. Clinical Neuropsychologist

## 2022-06-11 ENCOUNTER — Other Ambulatory Visit (HOSPITAL_COMMUNITY): Payer: Self-pay

## 2022-06-11 DIAGNOSIS — R3915 Urgency of urination: Secondary | ICD-10-CM | POA: Diagnosis not present

## 2022-06-11 DIAGNOSIS — K5900 Constipation, unspecified: Secondary | ICD-10-CM | POA: Diagnosis not present

## 2022-06-11 DIAGNOSIS — S32030D Wedge compression fracture of third lumbar vertebra, subsequent encounter for fracture with routine healing: Secondary | ICD-10-CM | POA: Diagnosis not present

## 2022-06-11 DIAGNOSIS — R770 Abnormality of albumin: Secondary | ICD-10-CM | POA: Diagnosis not present

## 2022-06-11 MED ORDER — DIAZEPAM 5 MG PO TABS
10.0000 mg | ORAL_TABLET | Freq: Two times a day (BID) | ORAL | 0 refills | Status: DC | PRN
  Filled 2022-06-11: qty 20, 5d supply, fill #0

## 2022-06-11 MED ORDER — TAMSULOSIN HCL 0.4 MG PO CAPS
0.4000 mg | ORAL_CAPSULE | Freq: Every day | ORAL | Status: DC
  Administered 2022-06-11 – 2022-06-12 (×2): 0.4 mg via ORAL
  Filled 2022-06-11 (×2): qty 1

## 2022-06-11 MED ORDER — DICLOFENAC SODIUM 1 % EX GEL
2.0000 g | Freq: Four times a day (QID) | CUTANEOUS | 0 refills | Status: DC
  Filled 2022-06-11: qty 100, fill #0

## 2022-06-11 MED ORDER — TAMSULOSIN HCL 0.4 MG PO CAPS
0.4000 mg | ORAL_CAPSULE | Freq: Every day | ORAL | 0 refills | Status: DC
  Filled 2022-06-11: qty 30, 30d supply, fill #0

## 2022-06-11 NOTE — Progress Notes (Signed)
Occupational Therapy Discharge Summary  Patient Details  Name: Luis [name]. MRN: [id number] Date of Birth: 09-26-1955  Date of Discharge from [place] 10, 2023  Today's Date: 06/11/2022 OT Individual Time: 3329-5188 OT Individual Time Calculation (min): 77 min    Patient has met 3 of 5 long term goals due to improved activity tolerance, improved balance, postural control, and ability to compensate for deficits.  Patient to discharge at overall Supervision- min A level.  Patient's care partner is independent to provide the necessary physical assistance at discharge. Luis [name] has made great progress in returning to his baseline in ADLs and ADL transfers. D/t the progression of his FSHD and parkinson's he utilizes unusual but effect movement compensations at baseline that have kept him active and fulfilled in his current abilities. His wife [name] is a great advocate and supporter of Luis [name] and willing to continue providing the level of care she was providing previously.   Reasons goals not met: ADL goals were set too high given pt's baseline and pt's spouse providing max A in the shower, assisting for LB dressing, and toileting. This is her preference to help.   Recommendation:  Patient will benefit from ongoing skilled OT services in outpatient setting to continue to advance functional skills in the area of energy conservation and strengthening/AROM as appropriate in the UE.   Equipment: No equipment provided  Reasons for discharge: treatment goals met and discharge from hospital  Patient/family agrees with progress made and goals achieved: Yes   Skilled OT intervention: Pt received standing in the bathroom with 9/10 pain, requesting shower as intervention.     OT Discharge Precautions/Restrictions  Precautions Precautions: Back Precaution Comments: Kyphoplasty 05/31/22 Required Braces or Orthoses: Spinal Brace Spinal Brace: Thoracolumbosacral orthotic Restrictions Weight  Bearing Restrictions: No Other Position/Activity Restrictions: pt/wife refused TLSO    ADL ADL Eating: Supervision/safety Where Assessed-Eating: Bed level Grooming: Supervision/safety Where Assessed-Grooming: Sitting at sink Upper Body Bathing: Minimal assistance Where Assessed-Upper Body Bathing: Edge of bed Lower Body Bathing: Moderate assistance Where Assessed-Lower Body Bathing: Edge of bed Upper Body Dressing: Supervision/safety Where Assessed-Upper Body Dressing: Edge of bed Lower Body Dressing: Minimal assistance Where Assessed-Lower Body Dressing: Edge of bed Toileting: Supervision/safety Where Assessed-Toileting: Glass blower/designer: Distant supervision (when simulating home height of toilet) Toilet Transfer Method: Stand pivot Social research officer, government: Distant supervision Social research officer, government Method: Ambulating Vision Baseline Vision/History: 0 No visual deficits Patient Visual Report: No change from baseline Vision Assessment?: No apparent visual deficits Perception  Perception: Within Functional Limits Praxis Praxis: Intact Cognition Cognition Overall Cognitive Status: Within Functional Limits for tasks assessed Arousal/Alertness: Awake/alert Orientation Level: Person;Place;Situation Person: Oriented Place: Oriented Situation: Oriented Memory: Appears intact Awareness: Appears intact Problem Solving: Appears intact Safety/Judgment: Appears intact Brief Interview for Mental Status (BIMS) Repetition of Three Words (First Attempt): 3 Temporal Orientation: Year: Correct Temporal Orientation: Month: Accurate within 5 days Temporal Orientation: Day: Correct Recall: "Sock": Yes, no cue required Recall: "Blue": Yes, no cue required Recall: "Bed": Yes, no cue required BIMS Summary Score: 15 Sensation Sensation Light Touch: Appears Intact Hot/Cold: Appears Intact Coordination Gross Motor Movements are Fluid and Coordinated: No Fine Motor Movements are  Fluid and Coordinated: No Coordination and Movement Description: 2/2 FSHD muscular dystrophy and parkinsons. Pt and his wife report coordination and movement is near baseline Motor  Motor Motor: Abnormal postural alignment and control Motor - Discharge Observations: FSHD muscular dystrophy and parkinsons. Mobility  Bed Mobility Bed Mobility: Rolling Right;Rolling Left;Supine to Sit;Sit to  Supine Rolling Right: Independent with assistive device Rolling Left: Independent with assistive device Supine to Sit: Independent with assistive device Sit to Supine: Independent with assistive device Transfers Sit to Stand: Independent with assistive device Stand to Sit: Independent with assistive device  Trunk/Postural Assessment  Cervical Assessment Cervical Assessment: Within Functional Limits Thoracic Assessment Thoracic Assessment: Exceptions to [facility] (rounded sholders with weakness resultant from FSHD) Lumbar Assessment Lumbar Assessment: Exceptions to [facility] (posterior pelvic tilt with poor glute activation) Postural Control Postural Control: Deficits on evaluation Trunk Control: delayed 2/2 FSHD muscular dystrophy and parkinsons  Balance Balance Balance Assessed: Yes Static Sitting Balance Static Sitting - Balance Support: Feet supported Static Sitting - Level of Assistance: 7: Independent Dynamic Sitting Balance Dynamic Sitting - Balance Support: Feet supported;During functional activity Dynamic Sitting - Level of Assistance: 7: Independent Static Standing Balance Static Standing - Balance Support: During functional activity;Bilateral upper extremity supported Static Standing - Level of Assistance: 6: Modified independent (Device/Increase time) Dynamic Standing Balance Dynamic Standing - Balance Support: During functional activity;Bilateral upper extremity supported Dynamic Standing - Level of Assistance: 6: Modified independent (Device/Increase time) Extremity/Trunk Assessment RUE  Assessment RUE Assessment: Exceptions to [facility] General Strength Comments: Pt able to achieve about 70 degress of shoulder flexion with large abduction compensations. Scapulas heavily retracted and winging with functional reaching. Resting tremor also present LUE Assessment LUE Assessment: Exceptions to [facility] General Strength Comments: Slightly more range than the RUE and less of a tremor. Pt still limited by scapular musculature atrophy and abduction compensation   Curtis Sites 06/11/2022, 3:34 PM

## 2022-06-11 NOTE — Progress Notes (Signed)
Patient stated his wife took his home BiPAP home already as he is getting discharged tomorrow. Patient stated he wouldn't be using a BiPAP tonight.

## 2022-06-11 NOTE — TOC Transition Note (Signed)
1 discharge med in [place] for patient until discharge.

## 2022-06-11 NOTE — Discharge Summary (Signed)
Physician Discharge Summary  Patient ID: Luis Kunin Kirkpatrick Jr. MRN: [id number] DOB/AGE: 02/21/1956 66 y.o.  Admit date: 06/04/2022 Discharge date:   Discharge Diagnoses:  Principal Problem:   Lumbar compression fracture (HCC) Active Problems:   Low serum albumin DVT prophylaxis Pain management Parkinson's disease History of FSHD muscular dystrophy Constipation BPH     Discharged Condition: Stable  Significant Diagnostic Studies: IR KYPHO LUMBAR INC FX REDUCE BONE BX UNI/BIL CANNULATION INC/IMAGING  Result Date: 06/03/2022 INDICATION: 66 year old male with acute compression fracture of the L3 vertebral body with severe pain, not improving with medical management. EXAM: FLUOROSCOPY GUIDED L3 BALLOON KYPHOPLASTY COMPARISON:  MRI of the lumbar spine May 28, 2022. MEDICATIONS: As antibiotic prophylaxis, Ancef 2 g was ordered pre-procedure and administered intravenously within 1 hour of incision. All current medications are in the EMR and have been reviewed as part of this encounter. ANESTHESIA/SEDATION: Moderate (conscious) sedation was employed during this procedure. A total of Versed 1.5 mg and Fentanyl 75 mcg was administered intravenously by the radiology nurse. Total intra-service moderate Sedation Time: 33 minutes. The patient's level of consciousness and vital signs were monitored continuously by radiology nursing throughout the procedure under my direct supervision. FLUOROSCOPY: Radiation Exposure Index (as provided by the fluoroscopic device): PSV 4,580 mGy Kerma COMPLICATIONS: None immediate. PROCEDURE: Following a full explanation of the procedure along with the potential associated complications, an informed witnessed consent was obtained. The patient was placed in prone position on the angiography table. The lumbar spine region was prepped and draped in a sterile fashion. Under fluoroscopy, the L3 vertebral body was delineated and the skin area was marked. The skin was infiltrated  with a 1% Lidocaine approximately 5 cm lateral to the spinous process projection on the right. Using a 22-gauge spinal needle, the soft issue and the peripedicular space and periosteum were infiltrated with Bupivacaine 0.5%. A skin incision was made at the access site. Subsequently, an 11-gauge Kyphon trocar was inserted under fluoroscopic guidance until contact with the pedicle was obtained. The trocar was inserted under light hammer tapping into the pedicle until the posterior boundaries of the vertebral body was reached. The skin was infiltrated with a 1% Lidocaine approximately 5 cm lateral to the spinous process projection on the left. Using a 22-gauge spinal needle, the soft issue and the peripedicular space and periosteum were infiltrated with Bupivacaine 0.5%. A skin incision was made at the access site. Subsequently, an 11-gauge Kyphon trocar was inserted under fluoroscopic guidance until contact with the pedicle was obtained. The trocar was inserted under light hammer tapping into the pedicle until the posterior boundaries of the vertebral body was reached. The diamond mandrill was removed. A bone drill was coaxially advanced within the anterior third of the vertebral body and then exchanged for inflatable Kyphon balloons. These were centered within the mid-aspect of the vertebral body. The balloons were inflated to create a void to serve as a repository for the bone cement. Both balloons were deflated and through both cannulas, under continuous fluoroscopy guidance in the AP and lateral views, the vertebral body was filled with previously mixed polymethyl-methacrylate (PMMA) added to barium for opacification. Both cannulas were later removed. Post procedural radiographs showed adequate distribution of the cement within the L3 vertebral body without evidence of significant leakage or embolism. The access sites were cleaned and covered with a sterile bandage. IMPRESSION: 1. Successful fluoroscopy-guided  bilateral transpedicular approach for L3 vertebral body balloon kyphoplasty for treatment of osteoporotic fragility fracture. 2. If the patient has  known osteoporosis, recommend treatment as clinically indicated. If the patient's bone density status is unknown, DEXA scan is recommended. Electronically Signed   By: Pedro Earls M.D.   On: 06/03/2022 13:21   MR LUMBAR SPINE WO CONTRAST  Result Date: 05/29/2022 CLINICAL DATA:  Compression fracture on 05/27/2022 CT lumbar spine EXAM: MRI LUMBAR SPINE WITHOUT CONTRAST TECHNIQUE: Multiplanar, multisequence MR imaging of the lumbar spine was performed. No intravenous contrast was administered. COMPARISON:  MRI lumbar spine 07/22/2020, correlation is also made with CT lumbar spine 05/27/2022 FINDINGS: Segmentation:  5 lumbar-type vertebral bodies. Alignment: Mild levocurvature. Straightening of the normal lumbar lordosis. No listhesis. Vertebrae: Acute compression fracture of L3 with approximately 40% vertebral body height loss. No significant retropulsion of the posterior cortex. Redemonstrated compression deformity of L5, status post kyphoplasty, which appears unchanged from 07/14/2020. Approximately 4 mm retropulsion of the posterosuperior cortex of L5, unchanged. Increased T2 signal at the inferior aspect of L4 (series 10, image 5-8), without significant vertebral body height loss, possibly edema. Congenitally short pedicles, which narrow the AP diameter of the spinal canal. Conus medullaris and cauda equina: Conus extends to the L1 level. Conus and cauda equina appear normal. Paraspinal and other soft tissues: Atrophy of the paraspinous and psoas muscles Disc levels: T12-L1: No significant disc bulge. No spinal canal stenosis or neural foraminal narrowing. L1-L2: Minimal disc bulge. Small left foraminal protrusion. Mild facet arthropathy. No spinal canal stenosis. No neural foraminal narrowing. L2-L3: Mild disc bulge. Mild facet arthropathy.  Narrowing of the lateral recesses, which could affect the descending L3 nerve roots. Mild spinal canal stenosis, unchanged. No neural foraminal narrowing. L3-L4: Mild disc bulge with left foraminal and extreme lateral protrusion. Moderate facet arthropathy. Narrowing of the lateral recesses, which could affect the descending L4 nerve roots. Mild spinal canal stenosis, unchanged. No neural foraminal narrowing. L4-L5: Disc height loss and broad-based disc bulge. Retropulsion of the posterosuperior cortex of L5. Moderate facet arthropathy. Ligamentum flavum hypertrophy. Mild spinal canal stenosis. Narrowing of the lateral recesses, which could affect the descending L5 nerve roots. Mild left-greater-than-right neural foraminal narrowing, unchanged. L5-S1: Mild disc bulge. Mild facet arthropathy. No spinal canal stenosis. Mild-to-moderate left neural foraminal narrowing, unchanged. IMPRESSION: 1. Acute compression fracture of L3 with approximately 40% vertebral body height loss. 2. Unchanged compression deformity of L5, status post kyphoplasty. 3. Increased T2 signal at the inferior aspect of L4, likely edema, without significant vertebral body height loss to suggest additional compression fracture. 4. Otherwise stable degenerative changes, superimposed on short pedicles, which results in mild spinal canal stenosis at L2-L3, L3-L4, and L4-L5, as well as mild and mild-to-moderate neural foraminal narrowing, as described above. Electronically Signed   By: Merilyn Baba M.D.   On: 05/29/2022 01:35   DG Finger Middle Right  Result Date: 05/27/2022 CLINICAL DATA:  Fall clinically with distal phalanx bruising and fracture EXAM: RIGHT MIDDLE FINGER 2+V COMPARISON:  May 26, 2022 FINDINGS: Osteopenia. Revisualization of a fracture of the base of the distal third phalanx with intra-articular extension. Fracture is in unchanged alignment. Associated soft tissue edema. No additional fractures noted. IMPRESSION: Similar  appearance of a fracture of the base of the distal third phalanx. Electronically Signed   By: Valentino Saxon M.D.   On: 05/27/2022 13:55   CT Lumbar Spine Wo Contrast  Result Date: 05/27/2022 CLINICAL DATA:  Lumbar compression fracture. Back pain. Recent fall. EXAM: CT LUMBAR SPINE WITHOUT CONTRAST TECHNIQUE: Multidetector CT imaging of the lumbar spine was performed without intravenous  contrast administration. Multiplanar CT image reconstructions were also generated. RADIATION DOSE REDUCTION: This exam was performed according to the departmental dose-optimization program which includes automated exposure control, adjustment of the mA and/or kV according to patient size and/or use of iterative reconstruction technique. COMPARISON:  Lumbar spine radiographs 05/26/2022. Lumbar spine MRI 07/14/2020. FINDINGS: Segmentation: 5 lumbar type vertebrae. Alignment: No significant listhesis. Vertebrae: Acute fracture of the L3 vertebral body with compression of the superior and inferior endplates resulting in 09% vertebral body height loss. No retropulsion or posterior element fracture. Chronic, previously augmented L5 compression fracture. No suspicious osseous lesion. Paraspinal and other soft tissues: Marked fatty atrophy of the posterior paraspinal musculature bilaterally with history of muscular dystrophy. Asymmetric atrophy of the right psoas muscle. Mild paravertebral soft tissue edema at L3. Minimal abdominal aortic atherosclerosis without aneurysm. Disc levels: T12-L1: Negative. L1-2: Mild disc bulging and mild facet arthrosis without evidence of significant stenosis, similar to the prior MRI. L2-3: Disc bulging and moderate facet hypertrophy result in mild spinal stenosis and mild right greater than left neural foraminal stenosis, similar to the prior MRI. L3-4: Circumferential disc bulging greater to the left and moderate facet hypertrophy result in mild spinal stenosis and mild left greater than right  neural foraminal stenosis, similar to the prior MRI. L4-5: Moderate disc space narrowing. Circumferential disc bulging and moderate facet hypertrophy result in mild spinal stenosis and moderate bilateral neural foraminal stenosis, similar to the prior MRI. L5-S1: Disc bulging, a chronic left foraminal disc protrusion, and mild right and moderate left facet hypertrophy result in mild-to-moderate left neural foraminal stenosis without spinal stenosis, similar to the prior MRI. IMPRESSION: 1. Acute L3 compression fracture with 35% height loss. 2. Chronic L5 compression fracture. 3. Unchanged lumbar disc and facet degeneration with mild spinal stenosis from L2-3 to L4-5 and moderate neural foraminal stenosis at L4-5. 4. Aortic Atherosclerosis (ICD10-I70.0). Electronically Signed   By: Logan Bores M.D.   On: 05/27/2022 12:00    Labs:  Basic Metabolic Panel: Recent Labs  Lab 06/05/22 0714  NA 137  K 3.9  CL 102  CO2 26  GLUCOSE 115*  BUN 8  CREATININE 0.36*  CALCIUM 9.1    CBC: Recent Labs  Lab 06/05/22 0714  WBC 4.5  NEUTROABS 2.9  HGB 13.6  HCT 41.7  MCV 97.0  PLT 242    CBG: No results for input(s): "GLUCAP" in the last 168 hours.  Family history.  Father with CAD mother with brain cancer.  Denies any colon cancer esophageal cancer or rectal cancer  Brief HPI:   Luis Fischel Blau [name]. is a 66 y.o. right-handed male with history of Parkinson's disease maintained on Sinemet chronic severe neuropathic pain,FSHD muscular dystrophy followed by Dr. Tillman Abide at the [facility] clinic.  Per chart review lives with spouse.  Uses a rollator for mobility.  Presented 05/27/2022 after mechanical fall with increased pain in the lumbar region.  He does have a history of prior kyphoplasty L5 01/10/2020.  MRI imaging revealed acute compression fracture of L3 with approximately 40% vertebral body height loss.  Unchanged compression deformity of L5.  Underwent L3 balloon kyphoplasty 05/31/2022 per interventional  radiology.  He was ordered a back brace to be on when out of bed.  Remained on Sinemet as prior to admission for history of Parkinson's disease.  Placed on subcutaneous heparin for DVT prophylaxis.  Maintained on Dilaudid for pain control at home due to painful muscular dystrophy.  Therapy evaluations completed due to patient decreased  functional mobility was admitted for a comprehensive rehab program.   Hospital Course: Luis Gariepy Marcella [name]. was admitted to rehab 06/04/2022 for inpatient therapies to consist of PT, ST and OT at least three hours five days a week. Past admission physiatrist, therapy team and rehab RN have worked together to provide customized collaborative inpatient rehab.  Pertaining to patient's L3 compression fracture status post kyphoplasty 9/29.  Back brace on when out of bed.  Patient had undergone kyphoplasty 01/10/2020 at L5.  Subcutaneous heparin for DVT prophylaxis.  Chronic severe neuropathic pain maintained on Cymbalta 30 mg twice daily, the addition of Neurontin 100 mg 3 times daily, Dilaudid as prior to admission.  History of Parkinson's disease maintained on Sinemet.  Noted history of muscular dystrophy FSHD followed by Dr. Tillman Abide at the [facility] clinic.  Patient did receive consultation follow-up during hospital stay by neuropsychology with emotional support provided.  Bouts of constipation resolved with laxative assistance.Flomax added for some nocturnal frequency.No dysuria or hematuria   Blood pressures were monitored on TID basis and controlled   Rehab course: During patient's stay in rehab weekly team conferences were held to monitor patient's progress, set goals and discuss barriers to discharge. At admission, patient required min mod assist 85 feet rolling walker minimal assist sit to stand   Physical exam.  Blood pressure 104/62 pulse 54 temperature 97.9 respirations 16 oxygen saturation 99% room air Constitutional.  No acute distress HEENT Head.  Normocephalic and  atraumatic Eyes.  Pupils round and reactive to light no discharge without nystagmus Neck.  Supple nontender no JVD without thyromegaly Cardiac regular rate and rhythm without any extra sounds or murmur heard Abdomen.  Soft nontender positive bowel sounds without rebound Respiratory effort normal no respiratory distress without wheeze Musculoskeletal Comments.  Right upper extremity deltoid 2/5 bicep 2 -/5 triceps 2+/5 wrist extension 5 -/5 grip 4+/5 FA 4 -/5 Left upper extremity same except FA 4+/5 Right lower extremity hip flexors 4 -/5 knee extension 4+/5 dorsiflexion 0/5 plantarflexion 4+/5 Left lower extremity same except hip flexors 4/5 Muscle atrophy in shoulders and hip girdles. Neurologic.  Alert oriented x3  He/She  has had improvement in activity tolerance, balance, postural control as well as ability to compensate for deficits. He/She has had improvement in functional use RUE/LUE  and RLE/LLE as well as improvement in awareness.  Ambulating 110 feet rolling walker with supervision.  He did need rest breaks.  Perform slide board transfers from wheelchair to sitting edge of bed with supervision.  Perform sit to stand from elevated mat with rolling walker supervision.  Patient was able to roll onto each side while supine with contact-guard for guiding.  Completed sit to stand supervision from very elevated edge of bed for ADLs.  He completed hip abduction exercises with a ball with minimal activation but improvement during his rehabilitation stay.  He did need some assist for lower body ADLs.  Full family teaching completed plan discharge to home.       Disposition: Discharge to home    Diet: Regular  Special Instructions: No driving smoking or alcohol  Back brace when out of bed  Medications at discharge. 1.  Tylenol as needed 2.  Sinemet 50-200 mg 1 p.o. 3 times daily 3.  Valium 10 mg twice daily as needed anxiety 4.  Voltaren gel 2 g 4 times daily to affected area 5.   Colace 100 mg p.o. nightly 6.  Cymbalta 30 mg p.o. twice daily 7.  Neurontin 600 mg  p.o. every evening 8.  Dilaudid 8 mg p.o. 4 times daily 9.  MiraLAX daily hold for loose stools 10.  Prednisolone acetate 1 drop both eyes every other day 11.  Chronulac daily as needed 12.Flomax 0.4 mg daily  30-35 minutes were spent completing discharge summary and discharge planning  Discharge Instructions     Ambulatory referral to Physical Medicine Rehab   Complete by: As directed    Please set patient up with Dr. Marlis Edelson would prefer to follow up with her due to her experience.        Follow-up Information     de Rosario Jacks, MD Follow up.   Specialties: Radiology, Interventional Radiology Why: Call for appointment Contact information: [place] [address] [phone]         Melvyn Novas, MD Follow up.   Specialty: Neurology Why: call for appointment Contact information: [place]  [phone]         Courtney Heys, MD Follow up on 07/29/2022.   Specialty: Physical Medicine and Rehabilitation Why: Be there at 12:40 pm for 1 pm appointment Contact information: [address] [address] [phone]                 Signed: Lavon Paganini [place] 06/12/2022, 5:20 AM

## 2022-06-11 NOTE — Progress Notes (Addendum)
PROGRESS NOTE   Subjective/Complaints: Pt reports some urinary urgency and continues leakage after voiding. Denies dysuria.   ROS: Patient denies fever, rash, sore throat, blurred vision, dizziness, nausea, vomiting, diarrhea, cough, shortness of breath or chest pain,  headache, or mood change.   Objective:   No results found. No results for input(s): "WBC", "HGB", "HCT", "PLT" in the last 72 hours.  No results for input(s): "NA", "K", "CL", "CO2", "GLUCOSE", "BUN", "CREATININE", "CALCIUM" in the last 72 hours.   Intake/Output Summary (Last 24 hours) at 06/11/2022 1423 Last data filed at 06/11/2022 1300 Gross per 24 hour  Intake 600 ml  Output --  Net 600 ml         Physical Exam: Vital Signs Blood pressure 102/71, pulse 67, temperature 97.9 F (36.6 C), resp. rate 18, height '5\' 11"'$  (1.803 m), weight 71.1 kg, SpO2 99 %.  Constitutional: No distress . Vital signs reviewed. HEENT: NCAT, EOMI, oral membranes moist Neck: supple Cardiovascular: RRR without murmur. No JVD    Respiratory/Chest: CTA Bilaterally without wheezes or rales. Normal effort. Good air movement GI/Abdomen: soft, BS +, non-tender, non-distended Ext: no clubbing, cyanosis, or edema Psych: pleasant and cooperative  Skin: Clean and intact without signs of breakdown Bandages over L spine C/D/I Musculoskeletal:     Cervical back: Neck supple. No tenderness.  Left olecranon bursa with mild swelling, mild pain   From prior Exam    Comments: RUE- deltoid 2/5; Biceps 2-/5; Triceps 2+/5; WE 5-/5; Grip 4+/5; FA 4-/5 LUE- same except FA 4+/5 RLE- HF 4-/5; KE 4+/5' DF 0/5; PF 4+/5 LLE- same except HF 4/5---no changes in motor or posture Muscle atrophy in shoulder and hip girdles Adquate core strength LB-MB with minimal pain with palpation   Neurological: Alert and oriented x 3. Normal insight and awareness. Intact Memory. Normal language and speech.  Cranial nerve exam unremarkable. Resting tremor RUE.  Assessment/Plan: 1. Functional deficits which require 3+ hours per day of interdisciplinary therapy in a comprehensive inpatient rehab setting. Physiatrist is providing close team supervision and 24 hour management of active medical problems listed below. Physiatrist and rehab team continue to assess barriers to discharge/monitor patient progress toward functional and medical goals  Care Tool:  Bathing    Body parts bathed by patient: Right arm, Abdomen, Front perineal area, Right upper leg, Left upper leg, Face   Body parts bathed by helper: Left arm, Chest, Buttocks, Left lower leg, Right lower leg     Bathing assist Assist Level: Maximal Assistance - Patient 24 - 49%     Upper Body Dressing/Undressing Upper body dressing   What is the patient wearing?: Pull over shirt    Upper body assist Assist Level: Supervision/Verbal cueing    Lower Body Dressing/Undressing Lower body dressing      What is the patient wearing?: Underwear/pull up, Pants     Lower body assist Assist for lower body dressing: Minimal Assistance - Patient > 75%     Toileting Toileting    Toileting assist Assist for toileting: Supervision/Verbal cueing     Transfers Chair/bed transfer  Transfers assist  Chair/bed transfer activity did not occur: Safety/medical concerns  Chair/bed transfer assist  level: Supervision/Verbal cueing     Locomotion Ambulation   Ambulation assist      Assist level: Minimal Assistance - Patient > 75% Assistive device: Walker-rolling Max distance: 100'   Walk 10 feet activity   Assist     Assist level: Minimal Assistance - Patient > 75% Assistive device: Walker-rolling   Walk 50 feet activity   Assist    Assist level: Minimal Assistance - Patient > 75% Assistive device: Walker-rolling    Walk 150 feet activity   Assist Walk 150 feet activity did not occur: Safety/medical concerns (Unable  to ambulate >100' at this time secondary to impaired endurance/activity tolerance.)         Walk 10 feet on uneven surface  activity   Assist Walk 10 feet on uneven surfaces activity did not occur: Safety/medical concerns (Patient has a mehanical lift chair at PLOF- Did not perform stair mobility, ramp, or ambulate over uneven surfaces prior to admission.)         Wheelchair     Assist Is the patient using a wheelchair?: Yes Type of Wheelchair: Manual (Patient has power wheelchair at home, however is only used in the community and not within the home.)    Wheelchair assist level: Dependent - Patient 0% Max wheelchair distance: Therapist propelled manual wheelchair for time management.    Wheelchair 50 feet with 2 turns activity    Assist        Assist Level: Dependent - Patient 0%   Wheelchair 150 feet activity     Assist      Assist Level: Dependent - Patient 0%   Blood pressure 102/71, pulse 67, temperature 97.9 F (36.6 C), resp. rate 18, height '5\' 11"'$  (1.803 m), weight 71.1 kg, SpO2 99 %.  Medical Problem List and Plan: 1. Functional deficits secondary to L3 compression fracture.  Status post kyphoplasty 9/29.  Back brace when out of bed.  May remove brace to shower.             -patient may  shower             -ELOS/Goals: 10-14 days- supervision to min A due to FSHD  -Continue CIR therapies including PT, OT   -PRAFO ordered for night use bilateral  -Toe up braces have been ordered  -DNR orders placed per patient request  -Therapy considering aquatic therapy as good outpatient option  -DC home tomorrow 2.  Antithrombotics: -DVT/anticoagulation:  Pharmaceutical: Heparin             -antiplatelet therapy: n/a 3. Pain Management/history of chronic severe neuropathic pain: Cymbalta 30 mg twice daily, Dilaudid 8 mg every 6 hours as needed- will change dilaudid to 8 mg QID so doesn't have to keep asking for it- takes scheduled at home.   -10/4  Started gabapentin '100mg'$  TID  -10/5 leg pain improved, continue gabapentin  10/9 Reports leg pain continues to be improved 4. Mood/Behavior/Sleep: Valium 10 mg twice daily as needed anxiety             -antipsychotic agents: N/A 5. Neuropsych/cognition: This patient is capable of making decisions on his own behalf. 6. Skin/Wound Care: Routine skin checks 7. Fluids/Electrolytes/Nutrition: Routine in and outs with follow-up chemistries 8.  Parkinson's disease. RUE tremor. Sinemet 50-200 mg 3 times daily 9.  History of FSHD muscular dystrophy.  Follow-up [facility] clinic Dr. Tillman Abide 10.  Constipation.  Colace 100 mg nightly, MiraLAX daily, Chronulac as needed  -10/10, having regular BMs 11. FSHD- can use  his home trilegy for breathing issues due to Muscular dystrophy  -continues to be complaint with trilogy use  -at baseline 12. Low albumin, continue ensure supplement  -10/10 good PO intake 13. B/L elbow soreness, suspect related to increase use with therapy  -10/8 using voltaren with improvement   10/9 has had left elbow aspirated before--no need for that now   -continue voltaren, add ice, improve mechanics 13. Urinary urgency, reports of incomplete voiding  -10/10 will trail flomax, check PVRs    LOS: 7 days A FACE TO FACE EVALUATION WAS PERFORMED  Jennye Boroughs 06/11/2022, 2:23 PM

## 2022-06-11 NOTE — Progress Notes (Signed)
Physical Therapy Discharge Summary  Patient Details  Name: Luis [name]. MRN: [id number] Date of Birth: 1956-04-14  Date of Discharge from PT service:June 11, 2022  Today's Date: 06/11/2022 PT Individual Time: 1st Treatment Session: [id number]; 2nd Treatment Session: 1415-1500 PT Individual Time Calculation (min): 75 min; 45 min   Patient has met 5 of 6 long term goals due to improved activity tolerance, improved balance, improved postural control, increased strength, and decreased pain.  Patient to discharge at an ambulatory level Supervision.   Patient's care partner is independent to provide the necessary physical assistance at discharge.  Reasons goals not met: Patient was unable to meet the ambulation goal for gait >150' at this time secondary to R UE pain limiting his ability to ambulate >100' at this time.   Recommendation:  Patient will benefit from ongoing skilled PT services in outpatient setting to continue to advance safe functional mobility, address ongoing impairments in strength, dynamic stability, endurance and minimize fall risk.  Equipment: No equipment provided  Reasons for discharge: treatment goals met and discharge from hospital  Patient/family agrees with progress made and goals achieved: Yes  PT Discharge Precautions/Restrictions Precautions Precautions: Back Precaution Comments: Kyphoplasty 05/31/22 Required Braces or Orthoses: Spinal Brace (Patient and spouse decline use of TLSO- PA aware) Spinal Brace: Thoracolumbosacral orthotic Restrictions Weight Bearing Restrictions: No Pain Interference Pain Interference Pain Effect on Sleep: 2. Occasionally Pain Interference with Therapy Activities: 1. Rarely or not at all Pain Interference with Day-to-Day Activities: 1. Rarely or not at all Vision/Perception  Vision - History Ability to See in Adequate Light: 0 Adequate Perception Perception: Within Functional Limits Praxis Praxis: Intact   Cognition Overall Cognitive Status: Within Functional Limits for tasks assessed Arousal/Alertness: Awake/alert Orientation Level: Oriented X4 Year: 2023 Month: October Day of Week: Correct Memory: Appears intact Awareness: Appears intact Problem Solving: Appears intact Safety/Judgment: Appears intact Sensation Sensation Light Touch: Appears Intact Hot/Cold: Appears Intact Coordination Gross Motor Movements are Fluid and Coordinated: No Fine Motor Movements are Fluid and Coordinated: No Coordination and Movement Description: 2/2 FSHD muscular dystrophy and parkinsons. Pt and his wife report coordination and movement is near baseline Motor  Motor Motor: Abnormal postural alignment and control Motor - Discharge Observations: FSHD muscular dystrophy and parkinsons.  Mobility Bed Mobility Bed Mobility: Rolling Right;Rolling Left;Supine to Sit;Sit to Supine Rolling Right: Independent with assistive device Rolling Left: Independent with assistive device Supine to Sit: Independent with assistive device Sit to Supine: Independent with assistive device Transfers Transfers: Sit to Stand;Stand to Sit;Stand Pivot Transfers Sit to Stand: Independent with assistive device Stand to Sit: Independent with assistive device Stand Pivot Transfers: Independent with assistive device Transfer (Assistive device): Rolling walker Locomotion  Gait Ambulation: Yes Gait Assistance: Independent with assistive device Gait Distance (Feet): 150 Feet Assistive device: Rolling walker Gait Gait: Yes Gait Pattern: Left steppage;Right steppage;Decreased dorsiflexion - left;Decreased dorsiflexion - right;Wide base of support Gait velocity: Decreased Stairs / Additional Locomotion Stairs: No Ramp: Dependent - mechanical lift Curb: Dependent - mechanical lift Wheelchair Mobility Wheelchair Mobility: Yes (Patient uses a power wheelchair at home and moves via ambulation throughout the home) Wheelchair  Assistance: Dependent - Patient 0% Wheelchair Parts Management: Needs assistance  Trunk/Postural Assessment  Cervical Assessment Cervical Assessment: Within Functional Limits Thoracic Assessment Thoracic Assessment: Exceptions to [facility] (Kyphotic posture with rounded shoulders) Lumbar Assessment Lumbar Assessment: Exceptions to [facility] (Posterior pelvic tilt) Postural Control Postural Control: Deficits on evaluation Trunk Control: delayed 2/2 FSHD muscular dystrophy and parkinsons  Balance Balance  Balance Assessed: Yes Static Sitting Balance Static Sitting - Balance Support: Feet supported Static Sitting - Level of Assistance: 7: Independent Dynamic Sitting Balance Dynamic Sitting - Balance Support: Feet supported;During functional activity Dynamic Sitting - Level of Assistance: 7: Independent Static Standing Balance Static Standing - Balance Support: During functional activity;Bilateral upper extremity supported Static Standing - Level of Assistance: 6: Modified independent (Device/Increase time) Dynamic Standing Balance Dynamic Standing - Balance Support: During functional activity;Bilateral upper extremity supported Dynamic Standing - Level of Assistance: 6: Modified independent (Device/Increase time) Dynamic Standing - Comments: Patient has very unique motor compensations during his mobility that he uses to complete functional mobility Extremity Assessment      RLE Assessment RLE Assessment: Exceptions to [facility] General Strength Comments: WFL for muscular dystrophy diagnosis- Grossly 3+/5 LLE Assessment LLE Assessment: Exceptions to [facility] General Strength Comments: WFL for muscular dystrophy diagnosis- Grossly 3+/5   Skilled Intervention- 1st Treatment Session- Patient greeted sitting perched EOB and agreeable to PT treatment session. Patient ambulated from room ~70' down the hallway with RW ModI. Patient required x2 standing rest breaks perched against the railing in the hallway-  Therapist brought manual wheelchair for safety as patient unable to ambulate entire distance to rehab gym.   Patient performed stand pivot transfer to/from car simulator with RW ModI. Once seated, patient was able to independently place B LE into/out of the car.   Patient ambulated ~10' to mat table from car simulator with RW ModI.   While seated, therapist and patient created a HEP together in order to maintain strength and independence upon discharge home.   Access Code: [email or web address] URL: [email or web address] Date: 06/11/2022 Prepared by: Jodi Mourning Nyzier Boivin  Exercises - Seated Modified Small Range Crunch in Chair  - Seated Long Arc Quad   - Seated Isometric Hip Abduction with Belt - Seated March - Seated Hamstring Curl with Anchored Resistance - Long Sitting Calf Stretch with Strap - Supine Bridge  Patient practiced each exercise and all questions were answered regarding technique, weights, reps, etc.   Patient returned to his room where he stood from low surface of manual wheelchair with MaxA and ambulated to EOB with RW ModI. Patient was able to transition from sitting EOB to supine independently. While supine, therapist demonstrated donning/doffing PRAFO boots at night in order to improve ankle alignment and provide protection for B heels. Spouse and patient reported understanding.   Patient left supine in bed with spouse present and all needs met.    2nd Treatment Session- Patient greeted perched on EOB and agreeable to PT treatment session. Patient reporting denies pain at this time. Patient gait trained x60' toward rehab gym with RW ModI- Patient with moderate LOB secondary to R toe catching the floor, however able to recover independently without assistance from therapist. Patient wheeled the rest of the way to rehab gym for time management and energy conservation.   Patient performed Nustep x20 minutes (patient request) on level 5- Patient reported really enjoying the  Nustep and requesting one for use at home. Wife aware and plans to reach out to OP clinic to inquire if they have one there or not.   Patient returned to his room where he required MaxA for standing from the low seat height with RW. Patient ambulated ~15' to EOB where he sat perched with spouse present and all needs met. Therapist provided patient with coban in order to put on the hand rests of his walker at home in order to assist with stability and overall safety.  Therapist also contacted [employer] during session in order to ensure delivery of B Toe-up braces- Will be delivered this afternoon.    [place] 06/11/2022, 10:39 AM

## 2022-06-11 NOTE — Progress Notes (Signed)
Inpatient Rehabilitation Care Coordinator Discharge Note   Patient Details  Name: Luis [name]. MRN: [id number] Date of Birth: 07-28-1956   Discharge location: [place] 24/7 SUPERVISION  Length of Stay: 8 DAYS  Discharge activity level: SUPERVISION-MOD/I LEVEL  Home/community participation: ACTIVE  Patient response PY:KDXIPJ Literacy - How often do you need to have someone help you when you read instructions, pamphlets, or other written material from your doctor or pharmacy?: Never  Patient response AS:NKNLZJ Isolation - How often do you feel lonely or isolated from those around you?: Never  Services provided included: MD, RD, PT, OT, RN, CM, TR, Pharmacy, Neuropsych, SW  Financial Services:  Financial Services Utilized: Medicare    Choices offered to/list presented to: PT AND WIFE  Follow-up services arranged:  Outpatient    Outpatient Servicies: [facility] [address]-PT AND OT WILL CALL TO SET UP APPOINTMENTS NO DME NEEDS HAS FROM PREVIOUS ADMITS      Patient response to transportation need: Is the patient able to respond to transportation needs?: Yes In the past 12 months, has lack of transportation kept you from medical appointments or from getting medications?: No In the past 12 months, has lack of transportation kept you from meetings, work, or from getting things needed for daily living?: No    Comments (or additional information):WIFE WAS HERE DAILY AND PARTICIPATED IN HUSBAND'S THERAPIES. BOTH FEEL PREPARED FOR DC TOMORROW.  Patient/Family verbalized understanding of follow-up arrangements:  Yes  Individual responsible for coordination of the follow-up plan: SANDRA-WIFE [phone]  Confirmed correct DME delivered: Elease Hashimoto 06/11/2022    Elease Hashimoto

## 2022-06-11 NOTE — Progress Notes (Signed)
Inpatient Rehabilitation Discharge Medication Review by a Pharmacist  A complete drug regimen review was completed for this patient to identify any potential clinically significant medication issues.  High Risk Drug Classes Is patient taking? Indication by Medication  Antipsychotic No   Anticoagulant No   Antibiotic No   Opioid Yes Hydromorphone for chronic pain   Antiplatelet No   Hypoglycemics/insulin No   Vasoactive Medication No   Chemotherapy No   Other Yes Carbidopa-levodopa for Parkinsons Diazepam prn for anxiety Diclofenac sodium gel for joint pain Duloxetine for mood and neuropathy Gabapentin for neuropathy Lactulose, docusate, and miralax for constipation  Prednisolone and timolol eye drops for Ocular Hypertension     Type of Medication Issue Identified Description of Issue Recommendation(s)  Drug Interaction(s) (clinically significant)     Duplicate Therapy     Allergy     No Medication Administration End Date     Incorrect Dose     Additional Drug Therapy Needed     Significant med changes from prior encounter (inform family/care partners about these prior to discharge). Stop zolpidem, doxycycline and psyllium Change diazepam to PRN   Team to educate patient   Other  Was on gabapentin '600mg'$  QPM PTA, changed to '100mg'$  TID in CIR. Discharging back on '600mg'$  QPM.  Confirmed with Team     Clinically significant medication issues were identified that warrant physician communication and completion of prescribed/recommended actions by midnight of the next day:  No  Name of provider notified for urgent issues identified:   Provider Method of Notification:     Pharmacist comments:   Time spent performing this drug regimen review (minutes):  25 min  Benetta Spar, PharmD, BCPS, [facility] Clinical Pharmacist  Please check AMION for all [place] phone numbers After 10:00 PM, call [place] [phone]

## 2022-06-12 ENCOUNTER — Other Ambulatory Visit (HOSPITAL_COMMUNITY): Payer: Self-pay

## 2022-06-18 ENCOUNTER — Telehealth (HOSPITAL_COMMUNITY): Payer: Self-pay

## 2022-06-18 NOTE — Telephone Encounter (Signed)
-----   Message from Pedro Earls, MD sent at 06/18/2022  8:35 AM EDT ----- Regarding: RE: follow-up Hi Ash,  You can let them know that there is nothing specific that he needs to do. As long as there is no incision issues, it will be ok to just continue to follow-up with his primary care. I can always see him if they prefer.   Thanks, Wendelyn Breslow   ----- Message ----- From: Danielle Dess Sent: 06/17/2022  10:48 AM EDT To: Pedro Earls, MD Subject: follow-up                                      Kat,   You just did a KP on him. Their d/c summary says to schedule a f/u. His wife says he is doing well but just wanted to follow instructions. Do you need them to come for a f/u?  Thanks, Lia Foyer

## 2022-06-18 NOTE — Telephone Encounter (Signed)
Pt's wife called to see if he needed to come in for a f/u since his KP with Dr. Debbrah Alar. Per Dr. Debbrah Alar, he does not need a f/u if he is feeling better. He is feeling much better and his pain level is at maybe a 1 or 2 which is amazing. She thanked me for calling her back and will call if things change. AW

## 2022-06-20 NOTE — Therapy (Signed)
OUTPATIENT OCCUPATIONAL THERAPY PARKINSON'S EVALUATION  Patient Name: Luis [name] Luis [name]. MRN: [id number] DOB:1956/02/24, 66 y.o., male Today's Date: 06/21/2022  PCP: Dr. Koleen Nimrod REFERRING PROVIDER: Wilford Sports PA-C, Dr. Dagoberto Ligas   OT End of Session - 06/21/22 0844     Visit Number 1    Number of Visits 25    Date for OT Re-Evaluation 09/13/22    Authorization Type Medicare    Authorization - Visit Number 1    Progress Note Due on Visit 10    OT Start Time 0718    OT Stop Time 0800    OT Time Calculation (min) 42 min             Past Medical History:  Diagnosis Date   Bilateral foot-drop 10/19/2018   Bursitis, trochanteric    Episodic   Carpal tunnel syndrome on right    Degenerative disk disease    l5-S1   FSH (facioscapulohumeral muscular dystrophy) ([place]) 10/07/2017   Gait abnormality 10/19/2018   GERD (gastroesophageal reflux disease)    Hemorrhoids    with anal fissures   Left lateral epicondylitis    Parkinson's disease 10/19/2018   Skin cancer    right temple area   Past Surgical History:  Procedure Laterality Date   CHOLECYSTECTOMY  2007   HEMORRHOIDECTOMY WITH HEMORRHOID BANDING     IR KYPHO LUMBAR INC FX REDUCE BONE BX UNI/BIL CANNULATION INC/IMAGING  01/10/2020   IR KYPHO LUMBAR INC FX REDUCE BONE BX UNI/BIL CANNULATION INC/IMAGING  05/31/2022   Patient Active Problem List   Diagnosis Date Noted   Low serum albumin    Lumbar compression fracture ([place]) 06/04/2022   Compression fracture of lumbar vertebra ([place]) 05/27/2022   Other chronic pain 10/18/2021   Chronic constipation 10/18/2021   Gait abnormality 10/19/2018   Bilateral foot-drop 10/19/2018   Parkinson's disease 10/19/2018   Swelling of lower leg - Right  03/06/2018   Tremor of right hand 12/19/2017   FSH (facioscapulohumeral muscular dystrophy) ([place]) 10/07/2017   Dilated cardiomyopathy ([place]) 05/02/2017   Upper airway cough syndrome 03/18/2014   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  03/17/2014    Abnormal chest xray 03/16/2014    ONSET DATE: 06/10/22  REFERRING DIAG:  G20.A1 (ICD-10-CM) - Parkinsons disease  G71.00 (ICD-10-CM) - Muscular dystrophy ([place])  S32.000A (ICD-10-CM) - Wedge compression fracture of unspecified lumbar vertebra, initial encounter for closed fracture     THERAPY DIAG:  Muscle weakness (generalized) - Plan: Ot plan of care cert/re-cert  Other lack of coordination - Plan: Ot plan of care cert/re-cert  Unsteadiness on feet - Plan: Ot plan of care cert/re-cert  Other symptoms and signs involving the musculoskeletal system - Plan: Ot plan of care cert/re-cert  Other symptoms and signs involving the nervous system - Plan: Ot plan of care cert/re-cert  Acute pain of left shoulder - Plan: Ot plan of care cert/re-cert  Acute pain of right shoulder - Plan: Ot plan of care cert/re-cert  Pain in left elbow - Plan: Ot plan of care cert/re-cert  Rationale for Evaluation and Treatment Rehabilitation  SUBJECTIVE:   SUBJECTIVE STATEMENT: Pt want's to decrease shoulder pain and elbow pain  Pt accompanied by: significant other  PERTINENT HISTORY: 66 y.o. male with medical history significant of Parkinson's disease on S[place], Chronic severe neuropathic pain, Muscular dystrophy followed by Dr [name] at [facility] clinic, chronic debility uses walker who was hospitalized with fall from standing after which he has had increase pain in his lumbar region. Dx of L3  compression fx, R middle finger fx, s/p kyphoplasty 05/31/22, pt was d/c from rehab on 06/12/22     PRECAUTIONS: Fall, kyphoplasty 05/31/22- per hospital chart, pt/ wife refused TLSO, per chart pt has spinal brace  (On eval pt did not appear to be wearing brace, however he had several layers on and all activities were performed in w/c)  WEIGHT BEARING RESTRICTIONS No  PAIN:  Are you having pain? Yes: NPRS scale: 9-10/10 Pain location: Right shoulder and left elbow Pain description: aching,  inflamed Aggravating factors: use Relieving factors: heat, ice  FALLS: Has patient fallen in last 6 months? NT  LIVING ENVIRONMENT: Lives with: lives with their spouse Lives in: House/apartment Stairs:  has lift to get into house Has following equipment at home: Environmental consultant - 4 wheeled  PLOF: Needs assistance with ADLs and Needs assistance with gait  PATIENT GOALS decrease UE pain   OBJECTIVE:   HAND DOMINANCE: Right, however uses left for some tasks  ADLs: Overall ADLs: needs assist Transfers/ambulation related to ADLs:needs assist with walker, has lift from toilet Eating: increased difficulty Grooming: set up UB Dressing: min-mod A LB Dressing: dependent per wife's report Toileting: needs assist, has lift, pt's wife assists Bathing: mod A standing in shower holding onto grab bars per wife's report Tub Shower transfers: mod A /max A per wife's report Equipment: Grab bars and Walk in shower   IADLs: Handwriting: 100% legible  MOBILITY STATUS: Needs Assist: uses walker at home  POSTURE COMMENTS:  rounded shoulders and forward head  ACTIVITY TOLERANCE: Activity tolerance: decreased activity tolerance  FUNCTIONAL OUTCOME MEASURES:  Physical performance test: PPT#2 (simulated eating) 12.87   COORDINATION: 9 Hole Peg test: Right: 34.31 sec; Left: 27.22 sec Box and Blocks:  Right 36blocks, Left 37blocks  UE ROM:  UPPER EXTREMITY ROM     Active ROM Right eval Left eval  Shoulder flexion 70*scaption 55 *scaption  Shoulder abduction    Shoulder adduction    Shoulder extension    Shoulder internal rotation    Shoulder external rotation    Elbow flexion    Elbow extension -30 -30  Wrist flexion    Wrist extension    Wrist ulnar deviation    Wrist radial deviation    Wrist pronation    Wrist supination 75% 75%   Significant bilateral scapular winging present    SENSATION: Not tested      COGNITION: Overall cognitive status: Within functional limits  for tasks assessed      TODAY'S TREATMENT:  N/A eval only   PATIENT EDUCATION: Education details: role of OT Person educated: Patient and Spouse Education method: Explanation Education comprehension: verbalized understanding   HOME EXERCISE PROGRAM: N/A  ASSESSMENT:  CLINICAL IMPRESSION: Patient is a 66 y.o. male who was seen today for occupational therapy evaluation for Parkinson's disease and muscular dystrophy.with medical history significant of Parkinson's disease on [place], Chronic severe neuropathic pain, Muscular dystrophy followed by Dr [name] at [facility] clinic. Pt  was hospitalized with fall from standing after which he has had increase pain in his lumbar region. Dx of L3 compression fx, R middle finger fx, s/p kyphoplasty 05/31/22, pt was d/c from rehab on 06/12/22. Pt can benefit from skilled occupational therapy to maximize pt's safety and I with ADLs/IADLS.   PERFORMANCE DEFICITS in functional skills including ADLs, IADLs, coordination, dexterity, ROM, strength, pain, flexibility, Fine motor control, Gross motor control, mobility, balance, endurance, decreased knowledge of precautions, decreased knowledge of use of DME, and UE functional use, and  psychosocial skills including coping strategies, environmental adaptation, habits, interpersonal interactions, and routines and behaviors.   IMPAIRMENTS are limiting patient from ADLs, IADLs, rest and sleep, play, leisure, and social participation.   COMORBIDITIES may have co-morbidities  that affects occupational performance. Patient will benefit from skilled OT to address above impairments and improve overall function.  MODIFICATION OR ASSISTANCE TO COMPLETE EVALUATION: No modification of tasks or assist necessary to complete an evaluation.  OT OCCUPATIONAL PROFILE AND HISTORY: Detailed assessment: Review of records and additional review of physical, cognitive, psychosocial history related to current functional  performance.  CLINICAL DECISION MAKING: Moderate - several treatment options, min-mod task modification necessary  REHAB POTENTIAL: Good  EVALUATION COMPLEXITY: Low     GOALS: Goals reviewed with patient? No  SHORT TERM GOALS: Target date: 07/19/22  I with initial HEP. Baseline: Goal status: INITIAL  2.  Pt will verbalize understanding of adapted strategies to maximize safety,  including AE/ DME prn  to increased I with ADLs/ IADLs(including ease for self feeding and writing at home)  Baseline:  Goal status: INITIAL  3.  Pt will report R shoulder pain no greater than 7/10 for ADLS. Baseline: 9-10/10 Goal status: INITIAL  4.  Pt will report left elbow pain no greater than 7/10 for ADLs. Baseline: 9-10/10 Goal status: INITIAL    LONG TERM GOALS: Target date: 09/13/22  I with updated HEP. Baseline:  Goal status: initial  2.  Pt will demonstrate improved fine motor coordination for ADLS as evidenced by decreasing 9 hole peg test to 31 secs or less for RUE. Baseline:  Goal status: INITIAL  3.  Pt will increase left sh. Flexion/ scaption to 60* or greater with pain no greater than 5/10. Baseline: 55* Goal status: INITIAL  4.  Pt will increase bilateral elbow extension to -25 for increased functional reach. Baseline:bilateral elbow ext -30  Goal status: INITIAL   PLAN: OT FREQUENCY: 2x week   OT DURATION: 12 weeks plus eval( anticipate d/c after 6 weeks)  PLANNED INTERVENTIONS: self care/ADL training, therapeutic exercise, therapeutic activity, neuromuscular re-education, manual therapy, passive range of motion, balance training, functional mobility training, ultrasound, paraffin, fluidotherapy, moist heat, cryotherapy, patient/family education, energy conservation, coping strategies training, DME and/or AE instructions, and Re-evaluation  RECOMMENDED OTHER SERVICES: PT  CONSULTED AND AGREED WITH PLAN OF CARE: Patient and family member/caregiver  PLAN FOR NEXT  SESSION: gentle ROM and pain reduction strategies   Ashvin Adelson, OT 06/21/2022, 3:30 PM

## 2022-06-21 ENCOUNTER — Ambulatory Visit: Payer: Medicare Other | Attending: Physician Assistant | Admitting: Occupational Therapy

## 2022-06-21 DIAGNOSIS — R29898 Other symptoms and signs involving the musculoskeletal system: Secondary | ICD-10-CM | POA: Diagnosis present

## 2022-06-21 DIAGNOSIS — M25522 Pain in left elbow: Secondary | ICD-10-CM | POA: Diagnosis present

## 2022-06-21 DIAGNOSIS — R29818 Other symptoms and signs involving the nervous system: Secondary | ICD-10-CM | POA: Diagnosis present

## 2022-06-21 DIAGNOSIS — R2681 Unsteadiness on feet: Secondary | ICD-10-CM | POA: Insufficient documentation

## 2022-06-21 DIAGNOSIS — R278 Other lack of coordination: Secondary | ICD-10-CM | POA: Diagnosis present

## 2022-06-21 DIAGNOSIS — M6281 Muscle weakness (generalized): Secondary | ICD-10-CM | POA: Diagnosis present

## 2022-06-21 DIAGNOSIS — M25511 Pain in right shoulder: Secondary | ICD-10-CM | POA: Insufficient documentation

## 2022-06-21 DIAGNOSIS — M25512 Pain in left shoulder: Secondary | ICD-10-CM | POA: Diagnosis present

## 2022-07-23 ENCOUNTER — Encounter: Payer: Self-pay | Admitting: Physical Therapy

## 2022-07-23 ENCOUNTER — Encounter: Payer: Self-pay | Admitting: Occupational Therapy

## 2022-07-23 ENCOUNTER — Ambulatory Visit: Payer: Medicare Other | Attending: Physician Assistant | Admitting: Occupational Therapy

## 2022-07-23 ENCOUNTER — Ambulatory Visit: Payer: Medicare Other | Admitting: Physical Therapy

## 2022-07-23 DIAGNOSIS — R2689 Other abnormalities of gait and mobility: Secondary | ICD-10-CM

## 2022-07-23 DIAGNOSIS — R2681 Unsteadiness on feet: Secondary | ICD-10-CM | POA: Diagnosis present

## 2022-07-23 DIAGNOSIS — M25511 Pain in right shoulder: Secondary | ICD-10-CM | POA: Insufficient documentation

## 2022-07-23 DIAGNOSIS — R29818 Other symptoms and signs involving the nervous system: Secondary | ICD-10-CM | POA: Diagnosis present

## 2022-07-23 DIAGNOSIS — R278 Other lack of coordination: Secondary | ICD-10-CM | POA: Diagnosis present

## 2022-07-23 DIAGNOSIS — M25522 Pain in left elbow: Secondary | ICD-10-CM | POA: Diagnosis present

## 2022-07-23 DIAGNOSIS — M25512 Pain in left shoulder: Secondary | ICD-10-CM | POA: Diagnosis present

## 2022-07-23 DIAGNOSIS — M6281 Muscle weakness (generalized): Secondary | ICD-10-CM | POA: Diagnosis not present

## 2022-07-23 DIAGNOSIS — R29898 Other symptoms and signs involving the musculoskeletal system: Secondary | ICD-10-CM | POA: Insufficient documentation

## 2022-07-23 NOTE — Therapy (Signed)
OUTPATIENT PHYSICAL THERAPY NEURO EVALUATION   Patient Name: Luis [name] [name]. MRN: [id number] DOB:12/10/1955, 66 y.o., male Today's Date: 07/23/2022   PCP: Kathalene Frames, MD  REFERRING PROVIDER: Cathlyn Parsons, PA-C, Dr. Dagoberto Ligas  END OF SESSION:  PT End of Session - 07/23/22 1151     Visit Number 1    Number of Visits 17    Date for PT Re-Evaluation 10/21/22   due to potential delay in scheduling   Authorization Type Medicare    PT Start Time 1015    PT Stop Time 1102    PT Time Calculation (min) 47 min    Equipment Utilized During Treatment Gait belt    Activity Tolerance Patient tolerated treatment well;Patient limited by fatigue;Patient limited by pain    Behavior During Therapy [facility] for tasks assessed/performed             Past Medical History:  Diagnosis Date   Bilateral foot-drop 10/19/2018   Bursitis, trochanteric    Episodic   Carpal tunnel syndrome on right    Degenerative disk disease    l5-S1   FSH (facioscapulohumeral muscular dystrophy) ([place]) 10/07/2017   Gait abnormality 10/19/2018   GERD (gastroesophageal reflux disease)    Hemorrhoids    with anal fissures   Left lateral epicondylitis    Parkinson's disease 10/19/2018   Skin cancer    right temple area   Past Surgical History:  Procedure Laterality Date   CHOLECYSTECTOMY  2007   HEMORRHOIDECTOMY WITH HEMORRHOID BANDING     IR KYPHO LUMBAR INC FX REDUCE BONE BX UNI/BIL CANNULATION INC/IMAGING  01/10/2020   IR KYPHO LUMBAR INC FX REDUCE BONE BX UNI/BIL CANNULATION INC/IMAGING  05/31/2022   Patient Active Problem List   Diagnosis Date Noted   Low serum albumin    Lumbar compression fracture ([place]) 06/04/2022   Compression fracture of lumbar vertebra ([place]) 05/27/2022   Other chronic pain 10/18/2021   Chronic constipation 10/18/2021   Gait abnormality 10/19/2018   Bilateral foot-drop 10/19/2018   Parkinson's disease 10/19/2018   Swelling of lower leg - Right  03/06/2018   Tremor  of right hand 12/19/2017   FSH (facioscapulohumeral muscular dystrophy) ([place]) 10/07/2017   Dilated cardiomyopathy ([place]) 05/02/2017   Upper airway cough syndrome 03/18/2014   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  03/17/2014   Abnormal chest xray 03/16/2014    ONSET DATE: 06/06/2022  REFERRING DIAG: G20.A1 (ICD-10-CM) - Parkinson disease G71.00 (ICD-10-CM) - Muscular dystrophy, unspecified S32.000A (ICD-10-CM) - Compression of lumbar vertebra (HCC)  THERAPY DIAG:  Muscle weakness (generalized)  Other lack of coordination  Unsteadiness on feet  Other symptoms and signs involving the musculoskeletal system  Other symptoms and signs involving the nervous system  Other abnormalities of gait and mobility  Rationale for Evaluation and Treatment: Rehabilitation  SUBJECTIVE:  SUBJECTIVE STATEMENT: Pt reports at inpatient rehab, his L shoulder and elbow got inflamed and he has been having trouble using it.  Saw his PCP and then saw the sports medicine doctor and got a shot in his shoulder and had his elbow drained. 2 days ago was walking with his RW and his legs gave out and went to the floor and the fire department had to come over to get him up. Pt's wife reports that all the pressure he puts through his arms has made him lose all the strength in his legs. Pt's spouse reports that it took them 2 hours to get him out of the house. Pt's R side is more affected from PD. Has bilateral foot up braces. Pt has been using a RW since 2021. Has been using a RW at home for ambulating small distances. Has a lift chair at home and a lift seat for the toilet. Got a power w/c in 2020, reports it does not fit in the house and it doesn't fit him right anymore, reports they have adjusted it, but can't tolerate it due to his back. Reports  that he there is not room in his house for a power w/c.   Pt accompanied by: significant other wife, [name]   PERTINENT HISTORY: 66 y.o. male with medical history significant of Parkinson's disease on Sinement, Chronic severe neuropathic pain, FSHD Muscular dystrophy followed by Dr [name] at [facility] clinic, chronic debility uses walker who was hospitalized with fall from standing after which he has had increase pain in his lumbar region. Dx of L3 compression fx, R middle finger fx, s/p kyphoplasty 05/31/22, pt was d/c from rehab on 06/12/22         PAIN:  Are you having pain? Yes: 10/10 L shoulder and elbow pain 10/10 back pain due to MD.   PRECAUTIONS: Fall, kyphoplasty 05/31/22- per hospital chart, pt/ wife refused TLSO, per chart pt has spinal brace   WEIGHT BEARING RESTRICTIONS: No  FALLS: Has patient fallen in last 6 months? Yes. Number of falls 2  LIVING ENVIRONMENT: Lives with: lives with their spouse Lives in: House/apartment Stairs: Has a ramp and a lift.  Has following equipment at home: Gilford Rile - 2 wheeled, Wheelchair (power), and Lift seat, Lift chair, in the process of trying to get a hospital bed.   PLOF: Needs assistance with ADLs, Needs assistance with gait, and Needs assistance with transfers  PATIENT GOALS: "Not sure why I am here, need to work on my L shoulder and elbow"   OBJECTIVE:   COGNITION: Overall cognitive status: Within functional limits for tasks assessed   SENSATION: WFL  COORDINATION: Heel to shin: impaired bilat, easier to move LLE   POSTURE: rounded shoulders, forward head, increased thoracic kyphosis, posterior pelvic tilt, and sits in ABDucted position in wheelchair   LOWER EXTREMITY ROM:     Limited RLE knee extension, with ankle DF able to reach passive with PROM   LOWER EXTREMITY MMT:    MMT Right Eval Left Eval  Hip flexion 3-/5 4/5  Hip extension    Hip abduction 4+/5 4+/5  Hip adduction 3/5 3/5  Hip internal rotation    Hip  external rotation    Knee flexion 3/5 3/5  Knee extension 3+/5 4/5  Ankle dorsiflexion 2+/5 2+/5  Ankle plantarflexion    Ankle inversion    Ankle eversion    (Blank rows = not tested)  BED MOBILITY:  Did not assess during eval, pt has an elevated  bed at home to be able to stand up from. Pt's spouse reports they are looking into getting him a hospital bed.   TRANSFERS: Assistive device utilized: Walker - 2 wheeled  (pt requesting RW to be at max height) Sit to stand: Max A - +2 therapists  Stand to sit: Mod/Max A  With sit > stand from lower manual w/c, max A x2 therapists with pt directing therapist to help pt under from under his shoulders and pt pushing off from w/c, once in standing, min A provided from therapists for [place] as pt moving his hands to RW. Pt standing with a very wide BOS. Once in standing pt folds into 90 degrees of forward flexion with legs extended. Pt either resting forearms on RW or with L hand is only able to grip between his index and 3rd finger.   With stand > sit back into manual w/c, pt needing mod/max A to sit down due to pt lacking eccentric control.    GAIT: Gait pattern: step to pattern, decreased step length- Right, decreased step length- Left, decreased stance time- Right, decreased stance time- Left, decreased hip/knee flexion- Right, decreased hip/knee flexion- Left, decreased ankle dorsiflexion- Right, decreased ankle dorsiflexion- Left, Right steppage, Left steppage, trunk flexed, wide BOS, poor foot clearance- Right, and poor foot clearance- Left Distance walked: 4' Assistive device utilized: Environmental consultant - 2 wheeled Level of assistance: Min A and Mod A Comments: Pt leaning over RW, in almost 90 degrees of forward flexion. Pt unable to fully grip with LUE>RUE. Pt wearing bilat toe up braces.     TODAY'S TREATMENT:                                                                                                                              N/A during  eval.     PATIENT EDUCATION: Education details: Clinical findings, POC, possibility for power w/c eval due to functional decline and pt not safe to use RW for ambulation. Pt's spouse reporting that they do not have room in their current home to fit a power w/c.  Person educated: Patient and Spouse Education method: Explanation Education comprehension: verbalized understanding  HOME EXERCISE PROGRAM: Will provide at a future session.   GOALS: Goals reviewed with patient? Yes  SHORT TERM GOALS: Target date: 08/21/2022  Pt will be independent with initial HEP with spouse's supervision in order to build upon functional gains made in therapy. Baseline: Goal status: INITIAL  2.  Pt will perform sit <> stands with mod A in order to demo improved functional mobility for transfers Baseline:  Goal status: INITIAL  3.  Pt will perform stand pivot transfer with RW with mod A in order to demo improved functional transfers for home.  Baseline:  Goal status: INITIAL  4.  Pt will perform dynamic sitting balance for at least 5 minutes with supervision in order to demo improved sitting balance/tolerance for ADLs.  Baseline:  Goal status: INITIAL  LONG TERM GOALS: Target date: 09/18/2022  Pt will be independent with final HEP with spouse's supervision in order to build upon functional gains made in therapy. Baseline:  Goal status: INITIAL  2.   Pt will perform sit <> stands with min A in order to demo improved functional mobility for transfers and decr caregiver burden. Baseline:  Goal status: INITIAL  3.  Pt will ambulate at least 53' with RW and supervision/min guard in order to demo improved household mobility.  Baseline: 4' at eval.  Goal status: INITIAL  4.  Pt will perform bed mobility with min guard in order to improve functional mobility/decr caregiver burden.  Baseline:  Goal status: INITIAL   ASSESSMENT:  CLINICAL IMPRESSION: Patient is a 66 year old male referred to  Neuro OPPT for Muscular Dystrophy and PD.  Pt  was hospitalized with fall from standing after which he has had increase pain in his lumbar region. Dx of L3 compression fx, R middle finger fx, s/p kyphoplasty 05/31/22, pt was d/c from rehab on 06/12/22.  Pt injured his shoulder/elbow during one of his last days of therapy and it has significantly gotten worse since, impairing his functional mobility. Pt's functional mobility including transfers and gait have gotten harder since inpatient rehab and pt now needing more assistance. The following deficits were present during the exam: decr strength, decr activity tolerance, pain, impaired coordination, decr balance, gait abnormalitie, difficulties with transfers. Pt would benefit from skilled PT to address these impairments and functional limitations to maximize functional mobility independence and decr fal risk.    OBJECTIVE IMPAIRMENTS: Abnormal gait, decreased activity tolerance, decreased balance, decreased coordination, decreased endurance, decreased knowledge of use of DME, decreased mobility, difficulty walking, decreased ROM, decreased strength, decreased safety awareness, impaired flexibility, impaired UE functional use, postural dysfunction, and pain.   ACTIVITY LIMITATIONS: carrying, lifting, bending, standing, squatting, transfers, bed mobility, bathing, toileting, dressing, hygiene/grooming, locomotion level, and caring for others  PARTICIPATION LIMITATIONS: cleaning, driving, and community activity  PERSONAL FACTORS: Age, Behavior pattern, Past/current experiences, Time since onset of injury/illness/exacerbation, and 3+ comorbidities: Parkinson's disease on Sinement, Chronic severe neuropathic pain, FSHD Muscular dystrophy followed by Dr [name] at [facility] clinic, chronic debility, and pt with significant shoulder and elbow pain  are also affecting patient's functional outcome.   REHAB POTENTIAL: Fair due to chronicity of condition   CLINICAL  DECISION MAKING: Unstable/unpredictable  EVALUATION COMPLEXITY: High  PLAN:  PT FREQUENCY: 2x/week  PT DURATION: 12 weeks  PLANNED INTERVENTIONS: Therapeutic exercises, Therapeutic activity, Neuromuscular re-education, Balance training, Gait training, Patient/Family education, Self Care, Vestibular training, Orthotic/Fit training, DME instructions, Manual therapy, and Re-evaluation  PLAN FOR NEXT SESSION: HEP for stretching/strengthening, try working on sit <> stands. Need a 2nd person to assist with transfers/gait.    Arliss Journey, PT, DPT 07/23/2022, 11:52 AM

## 2022-07-23 NOTE — Therapy (Signed)
OUTPATIENT OCCUPATIONAL THERAPY PARKINSON'S Treatment  Patient Name: Luis [name]. MRN: [id number] DOB:1955-09-26, 66 y.o., male Today's Date: 07/23/2022  PCP: Dr. Koleen Nimrod REFERRING PROVIDER: Wilford Sports PA-C, Dr. Dagoberto Ligas   OT End of Session - 07/23/22 1726     Visit Number 2    Number of Visits 25    Date for OT Re-Evaluation 09/13/22    Authorization Type Medicare    Authorization - Visit Number 2    Progress Note Due on Visit 10    OT Start Time 1104    OT Stop Time 1145    OT Time Calculation (min) 41 min              Past Medical History:  Diagnosis Date   Bilateral foot-drop 10/19/2018   Bursitis, trochanteric    Episodic   Carpal tunnel syndrome on right    Degenerative disk disease    l5-S1   FSH (facioscapulohumeral muscular dystrophy) ([place]) 10/07/2017   Gait abnormality 10/19/2018   GERD (gastroesophageal reflux disease)    Hemorrhoids    with anal fissures   Left lateral epicondylitis    Parkinson's disease 10/19/2018   Skin cancer    right temple area   Past Surgical History:  Procedure Laterality Date   CHOLECYSTECTOMY  2007   HEMORRHOIDECTOMY WITH HEMORRHOID BANDING     IR KYPHO LUMBAR INC FX REDUCE BONE BX UNI/BIL CANNULATION INC/IMAGING  01/10/2020   IR KYPHO LUMBAR INC FX REDUCE BONE BX UNI/BIL CANNULATION INC/IMAGING  05/31/2022   Patient Active Problem List   Diagnosis Date Noted   Low serum albumin    Lumbar compression fracture ([place]) 06/04/2022   Compression fracture of lumbar vertebra ([place]) 05/27/2022   Other chronic pain 10/18/2021   Chronic constipation 10/18/2021   Gait abnormality 10/19/2018   Bilateral foot-drop 10/19/2018   Parkinson's disease 10/19/2018   Swelling of lower leg - Right  03/06/2018   Tremor of right hand 12/19/2017   FSH (facioscapulohumeral muscular dystrophy) ([place]) 10/07/2017   Dilated cardiomyopathy ([place]) 05/02/2017   Upper airway cough syndrome 03/18/2014   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  03/17/2014    Abnormal chest xray 03/16/2014    ONSET DATE: 06/10/22  REFERRING DIAG:  G20.A1 (ICD-10-CM) - Parkinsons disease  G71.00 (ICD-10-CM) - Muscular dystrophy ([place])  S32.000A (ICD-10-CM) - [place]ge compression fracture of unspecified lumbar vertebra, initial encounter for closed fracture     THERAPY DIAG:  Muscle weakness (generalized)  Other lack of coordination  Unsteadiness on feet  Other symptoms and signs involving the musculoskeletal system  Acute pain of left shoulder  Acute pain of right shoulder  Pain in left elbow  Rationale for Evaluation and Treatment Rehabilitation  SUBJECTIVE:   SUBJECTIVE STATEMENT: Pt want's to decrease shoulder pain and elbow pain  Pt accompanied by: significant other  PERTINENT HISTORY: 66 y.o. male with medical history significant of Parkinson's disease on Sinement, Chronic severe neuropathic pain, Muscular dystrophy followed by Dr [name] at [facility] clinic, chronic debility uses walker who was hospitalized with fall from standing after which he has had increase pain in his lumbar region. Dx of L3 compression fx, R middle finger fx, s/p kyphoplasty 05/31/22, pt was d/c from rehab on 06/12/22     PRECAUTIONS: Fall, kyphoplasty 05/31/22- per hospital chart, pt/ wife refused TLSO, per chart pt has spinal brace  (On eval pt did not appear to be wearing brace, however he had several layers on and all activities were performed in w/c)  WEIGHT BEARING  RESTRICTIONS No  PAIN:  Are you having pain? Yes: NPRS scale: 9-10/10 Pain location: Right shoulder and left elbow Pain description: aching, inflamed Aggravating factors: use Relieving factors: heat, ice  FALLS: Has patient fallen in last 6 months? NT  LIVING ENVIRONMENT: Lives with: lives with their spouse Lives in: House/apartment Stairs:  has lift to get into house Has following equipment at home: Environmental consultant - 4 wheeled  PLOF: Needs assistance with ADLs and Needs assistance with gait  PATIENT  GOALS decrease UE pain   OBJECTIVE: from initial eval unless otherwise specified  HAND DOMINANCE: Right, however uses left for some tasks  ADLs: Overall ADLs: needs assist Transfers/ambulation related to ADLs:needs assist with walker, has lift from toilet Eating: increased difficulty Grooming: set up UB Dressing: min-mod A LB Dressing: dependent per wife's report Toileting: needs assist, has lift, pt's wife assists Bathing: mod A standing in shower holding onto grab bars per wife's report Tub Shower transfers: mod A /max A per wife's report Equipment: Grab bars and Walk in shower   IADLs: Handwriting: 100% legible  MOBILITY STATUS: Needs Assist: uses walker at home  POSTURE COMMENTS:  rounded shoulders and forward head  ACTIVITY TOLERANCE: Activity tolerance: decreased activity tolerance  FUNCTIONAL OUTCOME MEASURES:  Physical performance test: PPT#2 (simulated eating) 12.87   COORDINATION: 9 Hole Peg test: Right: 34.31 sec; Left: 27.22 sec Box and Blocks:  Right 36blocks, Left 37blocks  UE ROM:  UPPER EXTREMITY ROM     Active ROM Right eval Left eval  Shoulder flexion 70*scaption 55 *scaption  Shoulder abduction    Shoulder adduction    Shoulder extension    Shoulder internal rotation    Shoulder external rotation    Elbow flexion    Elbow extension -30 -30  Wrist flexion    Wrist extension    Wrist ulnar deviation    Wrist radial deviation    Wrist pronation    Wrist supination 75% 75%   Significant bilateral scapular winging present    SENSATION: Not tested      COGNITION: Overall cognitive status: Within functional limits for tasks assessed      TODAY'S TREATMENT:  Lengthy discussion with pt/ wife regarding pt shoulder and elbow issues. Pt arrived today stating his elbow and shoulder pain had worsened, and he has had several cortisone injections, and is now started on new medication to address pain. Pt states MD told him not to do  anything that would aggravate elbow and shoulder. Pt/ wife also state pt had fluid drained from elbow. Pt sees Dr. Dagoberto Ligas next week, therapist will message Dr. Dagoberto Ligas to determine how to proceed to minimize risk for pain and injury. As a result, therapist focused on fine motor coordination at table with bilateral UE's supported on table, flipping and dealing playing cards, min difficulty, min v.c until clarification of precautions can be received Simulated eating task with LUE, using foam grip on spoon, to minimize aggravating elbow.  Foam grips issued for home use. Discussion with pt/ wife regarding importance of balancing rest and activity with UE's to avoid further aggravation. (Use of locked overbed table recommended to allow pt to rest arms)Therapist also recommends pt considers use of standard or electric w/c at home for the short term to avoid excessive weightbearing through UE's. Pt/ wife are not receptive to w/c use at this time and state it won't work in their home. (Pt used urinal with wife's assist in bathroom therefore 2 units only)    PATIENT EDUCATION:  Education details: use of foam grips, importance of resting UE's to avoid overuse, recommendation to consider w/c use short term to allow UE's to heal. Person educated: Patient and Spouse Education method: Explanation, Education comprehension: verbalized understanding   HOME EXERCISE PROGRAM: N/A  ASSESSMENT:  CLINICAL IMPRESSION: Pt is progressing slowly limited by pain. Therapist to message Dr. Dagoberto Ligas regarding precautions.   PERFORMANCE DEFICITS in functional skills including ADLs, IADLs, coordination, dexterity, ROM, strength, pain, flexibility, Fine motor control, Gross motor control, mobility, balance, endurance, decreased knowledge of precautions, decreased knowledge of use of DME, and UE functional use, and psychosocial skills including coping strategies, environmental adaptation, habits, interpersonal interactions, and  routines and behaviors.   IMPAIRMENTS are limiting patient from ADLs, IADLs, rest and sleep, play, leisure, and social participation.   COMORBIDITIES may have co-morbidities  that affects occupational performance. Patient will benefit from skilled OT to address above impairments and improve overall function.  MODIFICATION OR ASSISTANCE TO COMPLETE EVALUATION: No modification of tasks or assist necessary to complete an evaluation.  OT OCCUPATIONAL PROFILE AND HISTORY: Detailed assessment: Review of records and additional review of physical, cognitive, psychosocial history related to current functional performance.  CLINICAL DECISION MAKING: Moderate - several treatment options, min-mod task modification necessary  REHAB POTENTIAL: Good  EVALUATION COMPLEXITY: Low     GOALS: Goals reviewed with patient? No  SHORT TERM GOALS: Target date: 07/19/22  I with initial HEP. Baseline: Goal status: INITIAL  2.  Pt will verbalize understanding of adapted strategies to maximize safety,  including AE/ DME prn  to increased I with ADLs/ IADLs(including ease for self feeding and writing at home)  Baseline:  Goal status: INITIAL  3.  Pt will report R shoulder pain no greater than 7/10 for ADLS. Baseline: 9-10/10 Goal status: INITIAL  4.  Pt will report left elbow pain no greater than 7/10 for ADLs. Baseline: 9-10/10 Goal status: INITIAL    LONG TERM GOALS: Target date: 09/13/22  I with updated HEP. Baseline:  Goal status: initial  2.  Pt will demonstrate improved fine motor coordination for ADLS as evidenced by decreasing 9 hole peg test to 31 secs or less for RUE. Baseline:  Goal status: INITIAL  3.  Pt will increase left sh. Flexion/ scaption to 60* or greater with pain no greater than 5/10. Baseline: 55* Goal status: INITIAL  4.  Pt will increase bilateral elbow extension to -25 for increased functional reach. Baseline:bilateral elbow ext -30  Goal status:  INITIAL   PLAN: OT FREQUENCY: 2x week   OT DURATION: 12 weeks plus eval( anticipate d/c after 6 weeks)  PLANNED INTERVENTIONS: self care/ADL training, therapeutic exercise, therapeutic activity, neuromuscular re-education, manual therapy, passive range of motion, balance training, functional mobility training, ultrasound, paraffin, fluidotherapy, moist heat, cryotherapy, patient/family education, energy conservation, coping strategies training, DME and/or AE instructions, and Re-evaluation  RECOMMENDED OTHER SERVICES: PT  CONSULTED AND AGREED WITH PLAN OF CARE: Patient and family member/caregiver  PLAN FOR NEXT SESSION: await clarification of precautions from Dr. Ulyess Mort, OT 07/23/2022, 5:27 PM

## 2022-07-29 ENCOUNTER — Encounter: Payer: Medicare Other | Admitting: Physical Medicine and Rehabilitation

## 2022-07-30 ENCOUNTER — Encounter: Payer: Medicare Other | Admitting: Occupational Therapy

## 2022-07-30 ENCOUNTER — Ambulatory Visit: Payer: Medicare Other | Admitting: Physical Therapy

## 2022-08-01 ENCOUNTER — Telehealth: Payer: Self-pay | Admitting: Occupational Therapy

## 2022-08-01 ENCOUNTER — Telehealth: Payer: Self-pay | Admitting: Physical Medicine and Rehabilitation

## 2022-08-01 NOTE — Telephone Encounter (Signed)
At discharge from rehab 10/11 pt was to wear back brace when oob. There was no indication on the dc summary as to how long. Therapy reports that he might not have even had a brace. He is now 6+ weeks out from discharge. He doesn't need any brace at this point (unless he wants something for comfort) and can proceed as tolerated with ROM/therapy.   There are (and were) no precautions for shoulder or elbow as far as I can see on my review. He can range and use his arms as tolerated.

## 2022-08-01 NOTE — Telephone Encounter (Signed)
Spoke with wife and asked her to call back over at outpatient therapy and get his appointments scheduled again.

## 2022-08-01 NOTE — Telephone Encounter (Signed)
Therapist's Janann August, DPT and Theone Murdoch, OTR/L spoke with pt's wife regarding placing OT and PT on hold until he has MD follow up..  Pt. and wife are reported at last therapy visit that pt's right shoulder and left elbow were injured by therapy on his last day at [facility].  They report that since hospital d/c pt has had worsening pain and he as a result has had several cortizone injections to R shoulder and fluid drained off of his  left elbow. Pt/ and wife state that they were instucted by pt's physician (not sure whether ortho or PCP) NOT to do anthing that would aggravate his UE's. Pt was supposed to have a f/u visit with Dr Dagoberto Ligas on 07/29/22 so that he could be evaluated and therapist's could receive information regarding precautions. Pt. cancelled this appointment.   The therapist's requested that pt. receives written clearance and precautions for his UE's in order to prevent further injury.  This was discussed with pt's wife at length.   Pt's therapy visit's have been cancelled for the next 2 weeks to allow for pt/ and wife to receive further evaluation of UE's and precautions from MD. Pt's wife is aware that therapy is on hold and she reports she will follow up with MD.   Theone Murdoch, OTR/L [phone] Phone: [phone] 4:37 PM 08/01/22

## 2022-08-01 NOTE — Telephone Encounter (Signed)
Patient's wife is very upset because therapy canceled appoiuntments due to Waiting for MD clearance of precautions regarding shoulder and elbow).  She needs someone to speak to therapy about thius so that they can continue it.  Please follow up with wife.

## 2022-08-02 ENCOUNTER — Other Ambulatory Visit: Payer: Medicare Other | Admitting: Nurse Practitioner

## 2022-08-02 ENCOUNTER — Ambulatory Visit: Payer: Medicare Other | Admitting: Occupational Therapy

## 2022-08-02 ENCOUNTER — Encounter: Payer: Self-pay | Admitting: Nurse Practitioner

## 2022-08-02 ENCOUNTER — Ambulatory Visit: Payer: Medicare Other | Admitting: Physical Therapy

## 2022-08-02 ENCOUNTER — Telehealth: Payer: Self-pay | Admitting: *Deleted

## 2022-08-02 DIAGNOSIS — R5381 Other malaise: Secondary | ICD-10-CM

## 2022-08-02 DIAGNOSIS — Z515 Encounter for palliative care: Secondary | ICD-10-CM

## 2022-08-02 DIAGNOSIS — R269 Unspecified abnormalities of gait and mobility: Secondary | ICD-10-CM

## 2022-08-02 NOTE — Progress Notes (Signed)
[place] Consult Note Telephone: [phone]  Fax: [phone]    Date of encounter: 08/02/22 9:06 PM PATIENT NAME: Balinda Quails Ollis Jr. [place] [address]   [phone] (home) [phone] (work) DOB: [birth year] MRN: [id number] PRIMARY CARE PROVIDER:    Kathalene Frames, MD,  [address], [address]  [place] [address] [phone]  RESPONSIBLE PARTY:    Contact Information     Name Relation Home Work Mobile   Watlington,Sandra Spouse [phone]  [phone]      I met face to face with patient in home. Palliative Care was asked to follow this patient by consultation request of  [name], MD to address advance care planning and complex medical decision making. This is a follow up visit.                                  ASSESSMENT AND PLAN / RECOMMENDATIONS:  Symptom Management/Plan: 1. Advance Care Planning; DNR   2. Debility secondary with gait disturbance secondary to  [facility] muscular dystrophy/Parkinson disease, overall progressive debility with worsening weakness. More difficulty with functional ability with increase pain with decrease use of left shoulder. Talked at length about Orthopedic visits. Talked about fall risk, mobility; progressive decline, functional abilities.   3. Chronic pain secondary to [facility] muscular dystrophy/Parkinson disease managed by Dr Koleen Nimrod with Dilaudid ongoing pain management, increasing difficulty managing pain as disease progresses.   4. Palliative care encounter; Palliative care encounter; Palliative medicine team will continue to support patient, patient's family, and medical team. Visit consisted of counseling and education dealing with the complex and emotionally intense issues of symptom management and palliative care in the setting of serious and potentially life-threatening   I spent 36 minutes providing this consultation. More than 50% of the time in  this consultation was spent in counseling and care coordination. PPS: 40%  Chief Complaint: Follow up palliative consult for complex medical decision making, address goals, manage ongoing symptoms  HISTORY OF PRESENT ILLNESS:  Terrell Ostrand Platt [name]. is a 66 y.o. year old male  with  multiple medical problems including FSH muscular dystrophy (,He was clinically diagnosed with FSHD in 30 (66 yo) at [facility]. Workup included NCV/EMG and muscle biopsy. 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 Small left-sided disc protrusion L1-2. There is also a small extraforaminal disc protrusion on left at L5-S1. Hospitalized at 05/27/2022 to 06/04/2022 for compression fracture following a fall. I visited Mr and Mrs Trim in their home. Mr Gelinas was sitting in his recliner. We talked about how Mr. Dubinsky has been feeling, ros, functional abilities. We talked about ambulation with walker. We talked about recent hospitalization including in-patient rehab with [facility]. We talked about appetite, constipation, sleep, trilogy.  We talked about updated with Dr Inda Merlin, Neurology appointment, symptoms including pain, shortness of breath with exertion/fatigue with weakness. We talked about medical goals. Discussed caregiver stress, coping strategies. Talked about f/u pc visit, scheduled. Therapeutic listening, emotional support provided. Questions answered.  Marland Kitchen   History obtained from review of EMR, discussion with Mrs and Mr. Gayton.  I reviewed available labs, medications, imaging, studies and related documents  from the EMR.  Records reviewed and summarized above.   ROS 10 point system reviewed all negative except HPI  Physical  Exam: Constitutional: NAD General: frail appearing, pleasant male ENMT: oral mucous membranes moist Skin: warm and dry Neuro:  + generalized weakness,  no cognitive impairment Psych: non-anxious affect, A and O x 3 Thank you for the opportunity to participate in the care of Mr. Mcalpine. Please call our office at [phone] if we can be of additional assistance.   Darcelle Herrada Ihor Gully, NP

## 2022-08-02 NOTE — Telephone Encounter (Signed)
[name] returned my call to talk about her husbands shoulder. The conversation was very long (72 minutes!) It sounds like PCP Dr Koleen Nimrod or Inda Merlin from [facility] was the one who made the comment about using caution when getting OT with his shoulder. I told her we would need more than that and it should be documented in order for the PT/OT to know how to proceed. I told her he needs to put it in a letter that can be faxed to me and I will send to the therapist. She agrees.Otherwise, she just needed to vent and I allowed her to, providing emotional support.Marland Kitchen

## 2022-08-06 ENCOUNTER — Ambulatory Visit: Payer: Medicare Other | Admitting: Physical Therapy

## 2022-08-06 ENCOUNTER — Encounter: Payer: Medicare Other | Admitting: Occupational Therapy

## 2022-08-08 ENCOUNTER — Other Ambulatory Visit: Payer: Self-pay

## 2022-08-08 ENCOUNTER — Ambulatory Visit: Payer: Medicare Other | Admitting: Physical Therapy

## 2022-08-08 ENCOUNTER — Ambulatory Visit (HOSPITAL_BASED_OUTPATIENT_CLINIC_OR_DEPARTMENT_OTHER): Payer: Medicare Other | Admitting: Physical Therapy

## 2022-08-08 ENCOUNTER — Encounter: Payer: Medicare Other | Admitting: Occupational Therapy

## 2022-08-08 ENCOUNTER — Ambulatory Visit: Payer: Medicare Other | Attending: Physician Assistant

## 2022-08-08 DIAGNOSIS — R2681 Unsteadiness on feet: Secondary | ICD-10-CM

## 2022-08-08 DIAGNOSIS — R2689 Other abnormalities of gait and mobility: Secondary | ICD-10-CM | POA: Diagnosis present

## 2022-08-08 DIAGNOSIS — M6281 Muscle weakness (generalized): Secondary | ICD-10-CM

## 2022-08-08 DIAGNOSIS — R29818 Other symptoms and signs involving the nervous system: Secondary | ICD-10-CM

## 2022-08-08 DIAGNOSIS — R262 Difficulty in walking, not elsewhere classified: Secondary | ICD-10-CM

## 2022-08-08 NOTE — Therapy (Addendum)
OUTPATIENT PHYSICAL THERAPY NEURO TREATMENT   Patient Name: Luis [name]. MRN: [id number] DOB:23-Nov-1955, 66 y.o., male Today's Date: 08/08/2022   PCP: Kathalene Frames, MD REFERRING PROVIDER: Cathlyn Parsons, PA-C  END OF SESSION:  PT End of Session - 08/08/22 1426     Visit Number 1    Number of Visits 16    Date for PT Re-Evaluation 10/17/22    Authorization Type Medicare/medicaid    PT Start Time 6767    PT Stop Time 2094    PT Time Calculation (min) 45 min    Equipment Utilized During Treatment Gait belt             Past Medical History:  Diagnosis Date   Bilateral foot-drop 10/19/2018   Bursitis, trochanteric    Episodic   Carpal tunnel syndrome on right    Degenerative disk disease    l5-S1   FSH (facioscapulohumeral muscular dystrophy) ([place]) 10/07/2017   Gait abnormality 10/19/2018   GERD (gastroesophageal reflux disease)    Hemorrhoids    with anal fissures   Left lateral epicondylitis    Parkinson's disease 10/19/2018   Skin cancer    right temple area   Past Surgical History:  Procedure Laterality Date   CHOLECYSTECTOMY  2007   HEMORRHOIDECTOMY WITH HEMORRHOID BANDING     IR KYPHO LUMBAR INC FX REDUCE BONE BX UNI/BIL CANNULATION INC/IMAGING  01/10/2020   IR KYPHO LUMBAR INC FX REDUCE BONE BX UNI/BIL CANNULATION INC/IMAGING  05/31/2022   Patient Active Problem List   Diagnosis Date Noted   Low serum albumin    Lumbar compression fracture ([place]) 06/04/2022   Compression fracture of lumbar vertebra ([place]) 05/27/2022   Other chronic pain 10/18/2021   Chronic constipation 10/18/2021   Gait abnormality 10/19/2018   Bilateral foot-drop 10/19/2018   Parkinson's disease 10/19/2018   Swelling of lower leg - Right  03/06/2018   Tremor of right hand 12/19/2017   FSH (facioscapulohumeral muscular dystrophy) ([place]) 10/07/2017   Dilated cardiomyopathy ([place]) 05/02/2017   Upper airway cough syndrome 03/18/2014   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  03/17/2014    Abnormal chest xray 03/16/2014    ONSET DATE: increased issue since recent hx of falling 05/25/22  REFERRING DIAG: G20.A1 (ICD-10-CM) - Parkinson disease G71.00 (ICD-10-CM) - Muscular dystrophy, unspecified S32.000A (ICD-10-CM) - Compression of lumbar vertebra (HCC)  THERAPY DIAG:  Muscle weakness (generalized)  Unsteadiness on feet  Other symptoms and signs involving the nervous system  Difficulty in walking, not elsewhere classified  Rationale for Evaluation and Treatment: Rehabilitation  SUBJECTIVE:  SUBJECTIVE STATEMENT: On-going mobility issues and deficits/limitations since hx of falling and suffering lumbar fracture and undergone kyphoplasty and stint at inpatient-rehab and D/C back to home since 10/11.  Pt has multiple co-morbidities and debility with worsening state of function as of late and presents to outpatient rehab to continue with plan of care and would like to pursue additional services and most notably aquatic therapy.    Pt accompanied by: significant other  PERTINENT HISTORY: 66 y.o. male with medical history significant of Parkinson's disease on [place], Chronic severe neuropathic pain, FSHD Muscular dystrophy followed by Dr [name] at [facility] clinic, chronic debility uses walker who was hospitalized with fall from standing after which he has had increase pain in his lumbar region. Dx of L3 compression fx, R middle finger fx, s/p kyphoplasty 05/31/22, pt was d/c from rehab on 06/12/22        PAIN:  Are you having pain? Yes: NPRS scale: 10-50/10 Pain location: right shoulder, left elbow Pain description: intense  Aggravating factors: staying still Relieving factors: movement  PRECAUTIONS: Fall  WEIGHT BEARING RESTRICTIONS: No  FALLS: Has patient fallen in last 6 months? Yes.  Number of falls 2  LIVING ENVIRONMENT: Lives with: lives with their spouse Lives in: House/apartment Stairs: Has a ramp and a lift.  Has following equipment at home: Gilford Rile - 2 wheeled, Wheelchair (power), and Lift seat, Lift chair, in the process of trying to get a hospital bed.  PLOF: Independent with household mobility with device, Needs assistance with ADLs, Needs assistance with homemaking, and Needs assistance with gait Pt reports he is typically able to use his lift chair and walk to/from bathroom.  Has elevating toilet LIFT.   PATIENT GOALS: try aquatic therapy  OBJECTIVE:   DIAGNOSTIC FINDINGS: at time of hospitalization  COGNITION: Overall cognitive status: Within functional limits for tasks assessed   SENSATION: WFL  COORDINATION: Grossly Impaired   EDEMA:  None at present  MUSCLE TONE:  Tibialis anterior hypotonic, global atrophy with bilateral winging scapula MUSCLE LENGTH: LE WFL  DTRs:  NT  POSTURE: rounded shoulders, forward head, and stands with excessive trunk flexion due to deficits and limitations  LOWER EXTREMITY ROM:     Active  Right Eval Left Eval  Hip flexion    Hip extension    Hip abduction    Hip adduction    Hip internal rotation    Hip external rotation    Knee flexion    Knee extension    Ankle dorsiflexion 0 0  Ankle plantarflexion    Ankle inversion    Ankle eversion     (Blank rows = not tested)  LOWER EXTREMITY MMT:  assessed in sitting and supine for gross measures  MMT Right Eval Left Eval  Hip flexion 3 3  Hip extension 2 2  Hip abduction 3 3  Hip adduction 2 2  Hip internal rotation    Hip external rotation    Knee flexion 3 3  Knee extension 3 3  Ankle dorsiflexion 0 0  Ankle plantarflexion    Ankle inversion    Ankle eversion    (Blank rows = not tested)  BED MOBILITY:  Sit to supine Mod A Supine to sit Mod A  TRANSFERS: Assistive device utilized: Environmental consultant - 2 wheeled  Sit to stand: Mod A and Max  A Stand to sit: Mod A, lacks eccentric control in last 10-15 degrees Chair to chair: Mod A and Max A Floor:  not attempted  RAMP:  NT due to safety concerns  CURB:  NT due to safety concerns  STAIRS: NT due to safety concerns  GAIT: Gait pattern: trunk flexed, wide BOS, poor foot clearance- Right, and poor foot clearance- Left Distance walked: 45 Assistive device utilized: Walker - 2 wheeled Level of assistance: CGA and Min A Comments: usually uses foot-up devices which are not present today  FUNCTIONAL TESTS:  TBD  PATIENT SURVEYS:    PATIENT EDUCATION: Education details: discussion of access to aquatic therapy and need for a therapist who is knowledgeable in this area to determine if appropriate for this environment as he will likely require use of mechanical lift for pool access and two-assist for safety. Person educated: Patient and Spouse Education method: Explanation Education comprehension: verbalized understanding  HOME EXERCISE PROGRAM: TBD  GOALS: Goals reviewed with patient? Yes  SHORT TERM GOALS: Target date: 08/21/22    Pt will be independent with initial HEP with spouse's supervision in order to maintain BLE strength to improve functional mobility Baseline: Goal status: INITIAL   2.  Pt will perform sit <> stands with mod A in order to demo improved functional mobility for transfers Baseline:  Goal status: INITIAL   3.  Pt will perform stand pivot transfer with RW with mod A in order to demo improved functional transfers for home.  Baseline:  Goal status: INITIAL   4.  Pt will perform dynamic sitting balance for at least 5 minutes with supervision in order to demo improved sitting balance/tolerance for ADLs.  Baseline:  Goal status: INITIAL  LONG TERM GOALS: Target date: 09/18/22   Pt will be independent with final HEP with spouse's supervision in order to build upon functional gains made in therapy. Baseline:  Goal status: INITIAL   2.    Pt will perform sit <> stands with min A in order to demo improved functional mobility for transfers and decreased caregiver burden. Baseline:  Goal status: INITIAL   3.  Pt will ambulate at least 15' with RW and supervision/min guard in order to demo improved household mobility.  Baseline: 45 ft min A Goal status: INITIAL   4.  Pt will perform bed mobility with min guard in order to improve functional mobility/decr caregiver burden.  Baseline:  Goal status: INITIAL   ASSESSMENT:  CLINICAL IMPRESSION: Patient is a 66 year old male referred to Neuro OPPT for Muscular Dystrophy and PD.  Pt  was hospitalized with fall from standing after which he has had increase pain in his lumbar region. Dx of L3 compression fx, R middle finger fx, s/p kyphoplasty 05/31/22, pt was d/c from rehab on 06/12/22.  Pt injured his shoulder/elbow during one of his last days of therapy which has caused some limitation in use of LUE impairing his functional mobility. Pt's functional mobility including transfers and gait have declined requiring increased caregiver assistance. The following deficits were present during the exam: decreased strength, reduced activity tolerance, pain, impaired coordination, impaired balance, gait abnormalities, difficulties with transfers. Pt would benefit from skilled PT to address these impairments and functional limitations to maximize functional mobility independence and decrease fall risk.   OBJECTIVE IMPAIRMENTS: Abnormal gait, decreased activity tolerance, decreased balance, decreased coordination, decreased endurance, decreased mobility, difficulty walking, decreased strength, impaired flexibility, impaired tone, postural dysfunction, and pain.   ACTIVITY LIMITATIONS: lifting, sitting, standing, squatting, transfers, bed mobility, bathing, toileting, dressing, reach over head, and locomotion level  PARTICIPATION LIMITATIONS:  self-care and reduced ability to ambulate household distances  per PLOF  PERSONAL FACTORS: Past/current experiences, Time since onset of injury/illness/exacerbation, and 3+ comorbidities: PD, MD, lumbar fx  are also affecting patient's functional outcome.   REHAB POTENTIAL: Good  CLINICAL DECISION MAKING: Evolving/moderate complexity  EVALUATION COMPLEXITY: Moderate  PLAN:  PT FREQUENCY: 1-2x/week  PT DURATION: 10 weeks  PLANNED INTERVENTIONS: Therapeutic exercises, Therapeutic activity, Neuromuscular re-education, Balance training, Gait training, Patient/Family education, Self Care, Joint mobilization, Stair training, Vestibular training, Canalith repositioning, Orthotic/Fit training, DME instructions, Aquatic Therapy, Dry Needling, Electrical stimulation, Wheelchair mobility training, Cryotherapy, Moist heat, Taping, and Manual therapy  PLAN FOR NEXT SESSION: aquatic therapy trial   4:43 PM, 08/08/22 M. Sherlyn Lees, PT, DPT Physical Therapist- [place] Office Number: [phone]

## 2022-08-09 ENCOUNTER — Encounter: Payer: Medicare Other | Attending: Physical Medicine & Rehabilitation | Admitting: Physical Medicine & Rehabilitation

## 2022-08-09 ENCOUNTER — Encounter: Payer: Self-pay | Admitting: Physical Medicine & Rehabilitation

## 2022-08-09 VITALS — BP 119/78 | HR 63 | Ht 71.0 in | Wt 150.0 lb

## 2022-08-09 DIAGNOSIS — G7102 Facioscapulohumeral muscular dystrophy: Secondary | ICD-10-CM | POA: Diagnosis present

## 2022-08-09 DIAGNOSIS — M25522 Pain in left elbow: Secondary | ICD-10-CM | POA: Diagnosis present

## 2022-08-09 DIAGNOSIS — M25511 Pain in right shoulder: Secondary | ICD-10-CM | POA: Insufficient documentation

## 2022-08-09 DIAGNOSIS — S32030D Wedge compression fracture of third lumbar vertebra, subsequent encounter for fracture with routine healing: Secondary | ICD-10-CM | POA: Diagnosis present

## 2022-08-09 NOTE — Progress Notes (Unsigned)
Subjective:    Patient ID: Luis [name]., male    DOB: [birth year], 65 y.o.   MRN: [id number]  HPI Hospital HPI Brief HPI:   Luis [name] Luis [name]. is a 66 y.o. right-handed male with history of Parkinson's disease maintained on Sinemet chronic severe neuropathic pain,FSHD muscular dystrophy followed by Dr. Tillman Abide at the [facility] clinic.  Per chart review lives with spouse.  Uses a rollator for mobility.  Presented 05/27/2022 after mechanical fall with increased pain in the lumbar region.  He does have a history of prior kyphoplasty L5 01/10/2020.  MRI imaging revealed acute compression fracture of L3 with approximately 40% vertebral body height loss.  Unchanged compression deformity of L5.  Underwent L3 balloon kyphoplasty 05/31/2022 per interventional radiology.  He was ordered a back brace to be on when out of bed.  Remained on Sinemet as prior to admission for history of Parkinson's disease.  Placed on subcutaneous heparin for DVT prophylaxis.  Maintained on Dilaudid for pain control at home due to painful muscular dystrophy.  Therapy evaluations completed due to patient decreased functional mobility was admitted for a comprehensive rehab program.     Hospital Course: Luis [name] Luis [name]. was admitted to rehab 06/04/2022 for inpatient therapies to consist of PT, ST and OT at least three hours five days a week. Past admission physiatrist, therapy team and rehab RN have worked together to provide customized collaborative inpatient rehab.  Pertaining to patient's L3 compression fracture status post kyphoplasty 9/29.  Back brace on when out of bed.  Patient had undergone kyphoplasty 01/10/2020 at L5.  Subcutaneous heparin for DVT prophylaxis.  Chronic severe neuropathic pain maintained on Cymbalta 30 mg twice daily, the addition of Neurontin 100 mg 3 times daily, Dilaudid as prior to admission.  History of Parkinson's disease maintained on Sinemet.  Noted history of muscular dystrophy FSHD followed by Dr. Tillman Abide at  the [facility] clinic.  Patient did receive consultation follow-up during hospital stay by neuropsychology with emotional support provided.  Bouts of constipation resolved with laxative assistance.Flomax added for some nocturnal frequency.No dysuria or hematuria     Blood pressures were monitored on TID basis and controlled     Interval History today 08/09/22 Patient is here for hospital follow-up after completing rehabilitation at [facility].  He is here with his wife.  His occupational and physical therapy outpatient treatments have been limited by concerns over shoulder and elbow range of motion.  His PCP Dr. Huston Foley is planning to send a letter of restrictions he would like for his therapy.  He is also scheduled to be evaluated for aqua therapy.  Patient has had continued difficulty with left elbow and right shoulder pain since his discharge.  He reports he was seen by his PCP and a sports medicine doctor.  He says he had a right shoulder steroid injection with mild to moderate benefit.  He also reports having a steroid injection in his left elbow with drainage of fluid.  He reports initial arthrocentesis grew a bacteria that was felt to be contaminant.  Repeat aspiration did not show bacterial growth.  He is using Celebrex with some benefit to his pain.  Pain Inventory Average Pain 10 Pain Right Now 8 My pain is constant, sharp, burning, dull, stabbing, and aching  In the last 24 hours, has pain interfered with the following? General activity 8 Relation with others 7 Enjoyment of life 2 What TIME of day is your pain at its worst? morning , daytime,  evening, and night Sleep (in general) Fair  Pain is worse with: bending, inactivity, and some activites Pain improves with: rest, heat/ice, pacing activities, medication, and injections Relief from Meds: 5  use a walker ability to climb steps?  no  I need assistance with the following:  bathing, meal prep, household duties, and  shopping  weakness tremor trouble walking spasms anxiety  Hospital f/u  Hospital f/u    Family History  Problem Relation Age of Onset   Allergies Brother    Allergies Sister    Heart disease Father    Brain cancer Mother    Bone cancer Brother    Social History   Socioeconomic History   Marital status: Married    Spouse name: Luis [name]   Number of children: 0   Years of education: Not on file   Highest education level: Not on file  Occupational History   Occupation: Programmer, [employer]  Tobacco Use   Smoking status: Never   Smokeless tobacco: Never  Vaping Use   Vaping Use: Never used  Substance and Sexual Activity   Alcohol use: No   Drug use: No   Sexual activity: Not on file  Other Topics Concern   Not on file  Social History Narrative   Lives with wife   Caffeine use: Sometimes tea   Right handed    Social Determinants of Health   Financial Resource Strain: Not on file  Food Insecurity: No Food Insecurity (05/27/2022)   Hunger Vital Sign    Worried About Running Out of Food in the Last Year: Never true    Ran Out of Food in the Last Year: Never true  Transportation Needs: No Transportation Needs (05/27/2022)   PRAPARE - Hydrologist (Medical): No    Lack of Transportation (Non-Medical): No  Physical Activity: Not on file  Stress: Not on file  Social Connections: Not on file   Past Surgical History:  Procedure Laterality Date   CHOLECYSTECTOMY  2007   HEMORRHOIDECTOMY WITH HEMORRHOID BANDING     IR [place] FX REDUCE BONE BX UNI/BIL CANNULATION INC/IMAGING  01/10/2020   IR KYPHO LUMBAR INC FX REDUCE BONE BX UNI/BIL CANNULATION INC/IMAGING  05/31/2022   Past Medical History:  Diagnosis Date   Bilateral foot-drop 10/19/2018   Bursitis, trochanteric    Episodic   Carpal tunnel syndrome on right    Degenerative disk disease    l5-S1   FSH (facioscapulohumeral muscular dystrophy) ([place]) 10/07/2017    Gait abnormality 10/19/2018   GERD (gastroesophageal reflux disease)    Hemorrhoids    with anal fissures   Left lateral epicondylitis    Parkinson's disease 10/19/2018   Skin cancer    right temple area   BP 119/78   Pulse 63   Ht '5\' 11"'$  (1.803 m)   Wt 150 lb (68 kg)   SpO2 98%   BMI 20.92 kg/m   Opioid Risk Score:   Fall Risk Score:  `1  Depression screen [facility] 2/9     08/09/2022   10:17 AM  Depression screen PHQ 2/9  Decreased Interest 0  Down, Depressed, Hopeless 0  PHQ - 2 Score 0  Altered sleeping 0  Tired, decreased energy 1  Change in appetite 0  Feeling bad or failure about yourself  0  Trouble concentrating 0  Moving slowly or fidgety/restless 0  Suicidal thoughts 0  PHQ-9 Score 1  Difficult doing work/chores Not difficult at all  Review of Systems  Musculoskeletal:  Positive for gait problem.  Neurological:  Positive for tremors and weakness.  Psychiatric/Behavioral:  The patient is nervous/anxious.   All other systems reviewed and are negative.     Objective:   Physical Exam  Gen: no distress, normal appearing HEENT: oral mucosa pink and moist, NCAT Chest: normal effort, normal rate of breathing Abd: soft, non-distended Ext: no edema Psych: pleasant, normal affect Skin: intact Neuro:  Alert and oriented x 3. Normal insight and awareness. Intact Memory. Normal language and speech. Cranial nerve exam unremarkable. Resting tremor RUE. Musculoskeletal: Left olecranon bursa with tenderness and swelling, R shoulder pain and tenderness to palpation and pain with abduction          Assessment & Plan:   1. L3 compression fracture satus post kyphoplasty 9/29.               -Continue OT/PT outpatient, pt waiting for letter from Dr. Inda Merlin regarding shoulder restrictions he is recommending  -Pt to be evaluated for aquatherapy, think this is reasonable if safety can be maintained  2..  FSHD muscular dystrophy.  uses home trilogy  -agree with  avoidance of overworking painful joints, avoiding painful ROM  --continue cymbalta/neurontin  3. L elbow pain, R shoulder pain   -likely shoulder dysfunction related to scapulothoracic dysfunction   -pt reports he has had recent cortisone injection and elbow  and shoulder / xrays arthrocentesis completed by PCP/sports medicine with improvement  -Continue celebrex

## 2022-08-13 ENCOUNTER — Encounter: Payer: Medicare Other | Admitting: Occupational Therapy

## 2022-08-13 ENCOUNTER — Ambulatory Visit: Payer: Medicare Other | Admitting: Physical Therapy

## 2022-08-14 ENCOUNTER — Telehealth: Payer: Self-pay | Admitting: *Deleted

## 2022-08-14 ENCOUNTER — Ambulatory Visit: Payer: Medicare Other

## 2022-08-14 NOTE — Telephone Encounter (Signed)
Letter received from Dr Mertha Finders re concerns for OT/PT faxed to [place] by Mrs Salvetti. I have faxed to Outpt Neuro Rehab attention Curt Bears Rine OT.

## 2022-08-15 ENCOUNTER — Ambulatory Visit: Payer: Medicare Other | Admitting: Physical Therapy

## 2022-08-15 ENCOUNTER — Encounter: Payer: Medicare Other | Admitting: Occupational Therapy

## 2022-08-20 ENCOUNTER — Ambulatory Visit: Payer: Medicare Other

## 2022-08-20 ENCOUNTER — Ambulatory Visit: Payer: Medicare Other | Admitting: Occupational Therapy

## 2022-08-20 ENCOUNTER — Ambulatory Visit: Payer: Medicare Other | Admitting: Physical Therapy

## 2022-08-20 DIAGNOSIS — R29818 Other symptoms and signs involving the nervous system: Secondary | ICD-10-CM

## 2022-08-20 DIAGNOSIS — R2689 Other abnormalities of gait and mobility: Secondary | ICD-10-CM

## 2022-08-20 DIAGNOSIS — M6281 Muscle weakness (generalized): Secondary | ICD-10-CM

## 2022-08-20 DIAGNOSIS — R2681 Unsteadiness on feet: Secondary | ICD-10-CM

## 2022-08-20 DIAGNOSIS — R262 Difficulty in walking, not elsewhere classified: Secondary | ICD-10-CM

## 2022-08-20 NOTE — Therapy (Signed)
OUTPATIENT PHYSICAL THERAPY NEURO TREATMENT   Patient Name: Luis [name]. MRN: [id number] DOB:14-Mar-1956, 66 y.o., male Today's Date: 08/20/2022   PCP: Kathalene Frames, MD REFERRING PROVIDER: Cathlyn Parsons, PA-C  END OF SESSION:  PT End of Session - 08/20/22 1011     Visit Number 3    Number of Visits 16    Date for PT Re-Evaluation 10/17/22    Authorization Type Medicare/medicaid    PT Start Time 1015    PT Stop Time 1100    PT Time Calculation (min) 45 min    Equipment Utilized During Treatment Gait belt             Past Medical History:  Diagnosis Date   Bilateral foot-drop 10/19/2018   Bursitis, trochanteric    Episodic   Carpal tunnel syndrome on right    Degenerative disk disease    l5-S1   FSH (facioscapulohumeral muscular dystrophy) ([place]) 10/07/2017   Gait abnormality 10/19/2018   GERD (gastroesophageal reflux disease)    Hemorrhoids    with anal fissures   Left lateral epicondylitis    Parkinson's disease 10/19/2018   Skin cancer    right temple area   Past Surgical History:  Procedure Laterality Date   CHOLECYSTECTOMY  2007   HEMORRHOIDECTOMY WITH HEMORRHOID BANDING     IR KYPHO LUMBAR INC FX REDUCE BONE BX UNI/BIL CANNULATION INC/IMAGING  01/10/2020   IR KYPHO LUMBAR INC FX REDUCE BONE BX UNI/BIL CANNULATION INC/IMAGING  05/31/2022   Patient Active Problem List   Diagnosis Date Noted   Low serum albumin    Lumbar compression fracture ([place]) 06/04/2022   Compression fracture of lumbar vertebra ([place]) 05/27/2022   Other chronic pain 10/18/2021   Chronic constipation 10/18/2021   Gait abnormality 10/19/2018   Bilateral foot-drop 10/19/2018   Parkinson's disease 10/19/2018   Swelling of lower leg - Right  03/06/2018   Tremor of right hand 12/19/2017   FSH (facioscapulohumeral muscular dystrophy) ([place]) 10/07/2017   Dilated cardiomyopathy ([place]) 05/02/2017   Upper airway cough syndrome 03/18/2014   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   03/17/2014   Abnormal chest xray 03/16/2014    ONSET DATE: increased issue since recent hx of falling 05/25/22  REFERRING DIAG: G20.A1 (ICD-10-CM) - Parkinson disease G71.00 (ICD-10-CM) - Muscular dystrophy, unspecified S32.000A (ICD-10-CM) - Compression of lumbar vertebra (HCC)  THERAPY DIAG:  Muscle weakness (generalized)  Unsteadiness on feet  Other symptoms and signs involving the nervous system  Difficulty in walking, not elsewhere classified  Other abnormalities of gait and mobility  Rationale for Evaluation and Treatment: Rehabilitation  SUBJECTIVE:  SUBJECTIVE STATEMENT: Nothing much going on, right shoulder and left elbow are still flared up  Pt accompanied by: significant other  PERTINENT HISTORY: 66 y.o. male with medical history significant of Parkinson's disease on [place], Chronic severe neuropathic pain, FSHD Muscular dystrophy followed by Dr [name] at [facility] clinic, chronic debility uses walker who was hospitalized with fall from standing after which he has had increase pain in his lumbar region. Dx of L3 compression fx, R middle finger fx, s/p kyphoplasty 05/31/22, pt was d/c from rehab on 06/12/22        PAIN:  Are you having pain? Yes: NPRS scale: 5/10 Pain location: right shoulder, left elbow Pain description: intense  Aggravating factors: staying still Relieving factors: movement  PRECAUTIONS: Fall  WEIGHT BEARING RESTRICTIONS: No  FALLS: Has patient fallen in last 6 months? Yes. Number of falls 2  LIVING ENVIRONMENT: Lives with: lives with their spouse Lives in: House/apartment Stairs: Has a ramp and a lift.  Has following equipment at home: Gilford Rile - 2 wheeled, Wheelchair (power), and Lift seat, Lift chair, in the process of trying to get a hospital bed.  PLOF:  Independent with household mobility with device, Needs assistance with ADLs, Needs assistance with homemaking, and Needs assistance with gait Pt reports he is typically able to use his lift chair and walk to/from bathroom.  Has elevating toilet LIFT.   PATIENT GOALS: try aquatic therapy  OBJECTIVE:   TODAY'S TREATMENT: 08/20/22 Activity Comments  Transfer training Sit-stand, mod-max A from w/c and elevated seat heights. Car transfer mod-max A for sit to stand, CGA   Seated hamstring curls 3x5 Red loop  Seated clamshells 3x5 reps Red loop  Gait training W/ RW and bilateral foot-up devices 4x25 ft through clinic           DIAGNOSTIC FINDINGS: at time of hospitalization  COGNITION: Overall cognitive status: Within functional limits for tasks assessed   SENSATION: WFL  COORDINATION: Grossly Impaired   EDEMA:  None at present  MUSCLE TONE:  Tibialis anterior hypotonic, global atrophy with bilateral winging scapula MUSCLE LENGTH: LE WFL  DTRs:  NT  POSTURE: rounded shoulders, forward head, and stands with excessive trunk flexion due to deficits and limitations  LOWER EXTREMITY ROM:     Active  Right Eval Left Eval  Hip flexion    Hip extension    Hip abduction    Hip adduction    Hip internal rotation    Hip external rotation    Knee flexion    Knee extension    Ankle dorsiflexion 0 0  Ankle plantarflexion    Ankle inversion    Ankle eversion     (Blank rows = not tested)  LOWER EXTREMITY MMT:  assessed in sitting and supine for gross measures  MMT Right Eval Left Eval  Hip flexion 3 3  Hip extension 2 2  Hip abduction 3 3  Hip adduction 2 2  Hip internal rotation    Hip external rotation    Knee flexion 3 3  Knee extension 3 3  Ankle dorsiflexion 0 0  Ankle plantarflexion    Ankle inversion    Ankle eversion    (Blank rows = not tested)  BED MOBILITY:  Sit to supine Mod A Supine to sit Mod A  TRANSFERS: Assistive device utilized: Environmental consultant -  2 wheeled  Sit to stand: Mod A and Max A Stand to sit: Mod A, lacks eccentric control in last 10-15 degrees Chair to chair: Mod A  and Max A Floor:  not attempted  RAMP:  NT due to safety concerns  CURB:  NT due to safety concerns  STAIRS: NT due to safety concerns  GAIT: Gait pattern: trunk flexed, wide BOS, poor foot clearance- Right, and poor foot clearance- Left Distance walked: 45 Assistive device utilized: Walker - 2 wheeled Level of assistance: CGA and Min A Comments: usually uses foot-up devices which are not present today  FUNCTIONAL TESTS:  TBD  PATIENT SURVEYS:    PATIENT EDUCATION: Education details: discussion of access to aquatic therapy and need for a therapist who is knowledgeable in this area to determine if appropriate for this environment as he will likely require use of mechanical lift for pool access and two-assist for safety. Person educated: Patient and Spouse Education method: Explanation Education comprehension: verbalized understanding  HOME EXERCISE PROGRAM: Access Code: [place] URL: [email or web address] Date: 08/20/2022 Prepared by: Sherlyn Lees  Exercises - Seated Hamstring Curls with Resistance  - 1 x daily - 7 x weekly - 3 sets - 5-8 reps - Seated Hip Abduction with Resistance  - 1 x daily - 7 x weekly - 3 sets - 10 reps  GOALS: Goals reviewed with patient? Yes  SHORT TERM GOALS: Target date: 08/21/22    Pt will be independent with initial HEP with spouse's supervision in order to maintain BLE strength to improve functional mobility Baseline: Goal status: INITIAL   2.  Pt will perform sit <> stands with mod A in order to demo improved functional mobility for transfers Baseline:  Goal status: INITIAL   3.  Pt will perform stand pivot transfer with RW with mod A in order to demo improved functional transfers for home.  Baseline:  Goal status: INITIAL   4.  Pt will perform dynamic sitting balance for at least 5  minutes with supervision in order to demo improved sitting balance/tolerance for ADLs.  Baseline:  Goal status: INITIAL  LONG TERM GOALS: Target date: 09/18/22   Pt will be independent with final HEP with spouse's supervision in order to build upon functional gains made in therapy. Baseline:  Goal status: INITIAL   2.   Pt will perform sit <> stands with min A in order to demo improved functional mobility for transfers and decreased caregiver burden. Baseline:  Goal status: INITIAL   3.  Pt will ambulate at least 57' with RW and supervision/min guard in order to demo improved household mobility.  Baseline: 45 ft min A Goal status: INITIAL   4.  Pt will perform bed mobility with min guard in order to improve functional mobility/decr caregiver burden.  Baseline:  Goal status: INITIAL   ASSESSMENT:  CLINICAL IMPRESSION: Returns to clinic for PT services and briefly discussed exercise physiology as it pertains to MD. Initiated activities for HEP to address generalized weakness with emphasis on hypertrophy mode to avoid unnessecary fatigue/stress with use of t-band vs weights. Notable difficulty with eccentric control with transfers due to proximal weakness. Will continue to develop safe home-based strategies and working with other PT clinic/colleagues to collaborate for aquatic session.  OBJECTIVE IMPAIRMENTS: Abnormal gait, decreased activity tolerance, decreased balance, decreased coordination, decreased endurance, decreased mobility, difficulty walking, decreased strength, impaired flexibility, impaired tone, postural dysfunction, and pain.   ACTIVITY LIMITATIONS: lifting, sitting, standing, squatting, transfers, bed mobility, bathing, toileting, dressing, reach over head, and locomotion level  PARTICIPATION LIMITATIONS:  self-care and reduced ability to ambulate household distances per PLOF  PERSONAL FACTORS: Past/current experiences, Time since onset of injury/illness/exacerbation,  and 3+ comorbidities: PD, MD, lumbar fx  are also affecting patient's functional outcome.   REHAB POTENTIAL: Good  CLINICAL DECISION MAKING: Evolving/moderate complexity  EVALUATION COMPLEXITY: Moderate  PLAN:  PT FREQUENCY: 1-2x/week  PT DURATION: 10 weeks  PLANNED INTERVENTIONS: Therapeutic exercises, Therapeutic activity, Neuromuscular re-education, Balance training, Gait training, Patient/Family education, Self Care, Joint mobilization, Stair training, Vestibular training, Canalith repositioning, Orthotic/Fit training, DME instructions, Aquatic Therapy, Dry Needling, Electrical stimulation, Wheelchair mobility training, Cryotherapy, Moist heat, Taping, and Manual therapy  PLAN FOR NEXT SESSION: aquatic therapy trial   10:12 AM, 08/20/22 M. Sherlyn Lees, PT, DPT Physical Therapist- [place] Office Number: [phone]

## 2022-08-22 ENCOUNTER — Encounter: Payer: Medicare Other | Admitting: Occupational Therapy

## 2022-08-22 ENCOUNTER — Ambulatory Visit: Payer: Medicare Other | Admitting: Physical Therapy

## 2022-08-27 ENCOUNTER — Ambulatory Visit: Payer: Medicare Other | Admitting: Physical Therapy

## 2022-08-27 ENCOUNTER — Encounter: Payer: Medicare Other | Admitting: Occupational Therapy

## 2022-09-03 ENCOUNTER — Ambulatory Visit: Payer: Medicare Other | Admitting: Physical Therapy

## 2022-09-03 ENCOUNTER — Encounter: Payer: Medicare Other | Admitting: Occupational Therapy

## 2022-09-05 ENCOUNTER — Ambulatory Visit: Payer: Medicare Other

## 2022-09-12 ENCOUNTER — Other Ambulatory Visit: Payer: Medicare Other

## 2022-09-12 ENCOUNTER — Inpatient Hospital Stay: Payer: Medicare Other | Admitting: Physical Medicine & Rehabilitation

## 2022-09-12 VITALS — BP 108/60 | HR 60 | Resp 18

## 2022-09-12 DIAGNOSIS — Z515 Encounter for palliative care: Secondary | ICD-10-CM

## 2022-09-12 NOTE — Progress Notes (Signed)
1108 Palliative Encounter Note   PATIENT NAME: Luis E Jungbluth Jr. DOB: [birth year] MRN: [id number]  PRIMARY CARE PROVIDER: Kathalene Frames, MD  RESPONSIBLE PARTY:  Acct ID - Guarantor Home Phone Work Phone Relationship Acct Type  [id number] - Luis [name],Luis [name] * [phone] Self P/F     [address], [name], [place] [address]   RN/SW completed follow up home visit. Wife also present .    HISTORY OF PRESENT ILLNESS: 66 y.o. year old male  with  multiple medical problems including FSH muscular dystrophy (,He was clinically diagnosed with FSHD in 29 (67 yo) at [facility]. Workup included NCV/EMG and muscle biopsy. 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 Small left-sided disc protrusion L1-2. There is also a small extraforaminal disc protrusion on left at L5-S1. Hospitalized at 05/27/2022 to 06/04/2022 for compression fracture following a fall.    Socially: Pt and wife live in singe story house.    Cognitive: Alert and oriented x 3. Answers questions appropriately. Will correct wife when talking if he doesn't agree with it.    Appetite: Wife and pt agree that appetite is good.    Mobility: Pt still walking with assistance. Uses walker. Walks in a stooped over manner. Has muscular dystrophy wheel chair but "it makes moving around worse."   Sleeping Pattern: Pt denies any issues with sleeping. Reports that he sleeps 8 hrs per night, and will nap during the day at times.  Pain: c/o pain to back(lower Lumbar area), Rt. shoulder, and Lt. elbow. Currently rates pain as 10 on 1-10 scale. Dilaudid '8mg'$ . MDs talked about Fentanyl, wife very nervous about changing. RN discussed benefits of  changing to fentanyl patches. Wife concerned about using patches with pt's lack of musculature.  Goals of care: get back to where he was before last hospitalization strength wise.    CODE STATUS:  DNR ADVANCED DIRECTIVES: N MOST FORM: N PPS:    PHYSICAL EXAM:   VITALS:   Vital Signs     09/12/2022 11:32 AM  BP 108/60  BP Location Left Arm  Patient Position Sitting  Cuff Size Normal  Pulse 60  Resp 18  SpO2 93 %      LUNGS: diminished breath sounds in bases  CARDIAC: Regular EXTREMITIES: MAE x 4. Very limited ROM noted to right shoulder. Pt unable to raise right arm over head. Left elbow swollen. Pt reports that it hurts when putting pressure on arms to stand with walker. SKIN:  no issues noted NEURO: Alert and oriented x 3.  Summary of visit: RN/SW arrived for f/u home visit. Wife met [place] at front door. Pt sitting in lift chair in den. Wife talking fast and appears anxious. Wife is very concerned about nationwide issue with opioids. Pt taking Dilaudid. States, "It has been back ordered twice now. What is he supposed to do? I finally found some, but really." Pt c/o pain in back, right shoulder, and left elbow. States, " the shoulder and elbow started hurting in rehab. Everyone keeps blaming my pain and limited motion on my muscular dystrophy, but that is not the cause. Something is wrong with them." Pt is optimistic, he will start aqua therapy Monday at [facility].     Next appt scheduled for: 10/03/22'@10'$   Jacqulyn Cane, RN

## 2022-09-12 NOTE — Progress Notes (Signed)
COMMUNITY PALLIATIVE CARE SW NOTE  PATIENT NAME: Luis E Sheetz Jr. DOB: 1955-12-12 MRN: [id number]  PRIMARY CARE PROVIDER: Kathalene Frames, MD  RESPONSIBLE PARTY:  Acct ID - Guarantor Home Phone Work Phone Relationship Acct Type  [id number] YANCY, KNOBLE [phone] Self P/F     [address] [name], [place] [address]   Social Work Initial Palliative Care Visit  Initial SW visit with patient and his wife-[name] at his home. Patient appeared to be alert and oriented x3. He was sitting up in a chair in the den area. Patient's wife provided a status update on patient. Patient is out of opioids and wife has worked with pharmacy and doctor to the medications. Patient report feeling good, but has pain to his right shoulder, left elbow and lower back. Patient report "I got pain all over".  He has weakness as result of the muscular dystrophy. Patient report that the weakness is constant. Patient finds relief with using Dilaudid. Patient is scheduled to see Dr. Inda Merlin this afternoon. Wife is pulling patient while he is on his walker. He is scheduled to have consult for aquatics therapy and [facility]. She needs assistance with getting in and out of the car and up and down from a chair. They have a wheelchair, but his wife reports that it only makes ambulation worse. Patient takes Patient reports that he is looking forward to going to getting aquatic center. Pepcid for stomach issues from Celebrex. Patient is eating at least 3 meals per day and report that his appetite is good.  Patient has advance directives to include a DNR. Not a veteran. Patient was born and raised in [place].  Next scheduled visit is 10/03/22 '@10'$  am.  Social History   Tobacco Use   Smoking status: Never   Smokeless tobacco: Never  Substance Use Topics   Alcohol use: No    CODE STATUS: DNR ADVANCED DIRECTIVES: Yes MOST FORM COMPLETE:  NO HOSPICE EDUCATION PROVIDED: No  Duration and documentation  of visit: 60 minutes  Favour Aleshire, LCSW

## 2022-09-16 ENCOUNTER — Ambulatory Visit: Payer: Medicare Other | Admitting: Physical Therapy

## 2022-09-18 ENCOUNTER — Encounter: Payer: Self-pay | Admitting: Physical Medicine and Rehabilitation

## 2022-09-18 ENCOUNTER — Encounter: Payer: Medicare Other | Attending: Physical Medicine & Rehabilitation | Admitting: Physical Medicine and Rehabilitation

## 2022-09-18 VITALS — BP 108/73 | HR 66 | Ht 71.0 in

## 2022-09-18 DIAGNOSIS — G7102 Facioscapulohumeral muscular dystrophy: Secondary | ICD-10-CM | POA: Diagnosis present

## 2022-09-18 DIAGNOSIS — M7918 Myalgia, other site: Secondary | ICD-10-CM

## 2022-09-18 DIAGNOSIS — M48062 Spinal stenosis, lumbar region with neurogenic claudication: Secondary | ICD-10-CM

## 2022-09-18 DIAGNOSIS — S32030D Wedge compression fracture of third lumbar vertebra, subsequent encounter for fracture with routine healing: Secondary | ICD-10-CM

## 2022-09-18 DIAGNOSIS — S32000S Wedge compression fracture of unspecified lumbar vertebra, sequela: Secondary | ICD-10-CM | POA: Diagnosis present

## 2022-09-18 DIAGNOSIS — M7521 Bicipital tendinitis, right shoulder: Secondary | ICD-10-CM

## 2022-09-18 DIAGNOSIS — M21371 Foot drop, right foot: Secondary | ICD-10-CM

## 2022-09-18 DIAGNOSIS — M21372 Foot drop, left foot: Secondary | ICD-10-CM

## 2022-09-18 MED ORDER — GABAPENTIN 600 MG PO TABS: 1200.0000 mg | ORAL_TABLET | Freq: Three times a day (TID) | ORAL | 5 refills | Status: DC

## 2022-09-18 MED ORDER — LIDOCAINE HCL 1 % IJ SOLN
4.0000 mL | Freq: Once | INTRAMUSCULAR | Status: AC
  Administered 2022-09-18: 4 mL via INTRADERMAL

## 2022-09-18 MED ORDER — TRIAMCINOLONE ACETONIDE 40 MG/ML IJ SUSP
40.0000 mg | Freq: Once | INTRAMUSCULAR | Status: AC
  Administered 2022-09-18: 40 mg via INTRAMUSCULAR

## 2022-09-18 NOTE — Addendum Note (Signed)
Addended by: Franchot Gallo on: 09/18/2022 11:33 AM   Modules accepted: Orders

## 2022-09-18 NOTE — Progress Notes (Addendum)
Subjective:    Patient ID: Luis [name]., male    DOB: 1956-03-24, 67 y.o.   MRN: [id number]  HPI Pt is a 67 yr old male with hx of FSH muscular dystrophy- dx'd in 1992- - also has Parkinson's disease, dilated cardiomyopathy- EF 41%; compression fx s/p fall s/p kyphoplasty at L5; also has disc protrusion Left L5/S1 and L disc protrusion at Left L1-2.  Here for f/u on his [place] muscular dystrophy.   S/p radiofrequency ablation on Right side-10/21-  went too deep- in groin- L5/S1-   Pt is DNR  Still has pain from nerve pain-  Now, can only walk with RW- doesn't like W/C- since harder to move with it.    Neurorehab- [address] could only see after 6 weeks-  Just getting aquatic therapy- scheduled for this coming Monday- to see if resistance training would work  So, therefore much weaker than was when left CIR.   R shoulder and R elbow "killing him"- has had injection in R shoulder last week- RTC- not biceps tendinitis- L elbow 1 month ago.  Has had in "front" of R shoulder- last done 10/23-   Feels like something torn when lifts R arm up- using R shoulder   Had bacteria in L elbow?- needed to be drained- Dr Tamera Punt at [facility]. No bacteria on repeat culture.    Has pain in R anterior shoulder- esp with movement-    When on Duloxetine- more edgy, and irritable.  Thinks might have tried Lyrica- but not sure-    Pain in back- aching pain- no burning- throbbing (+);   Dilaudid- Dr Koleen Nimrod- 8 mg  4x/day.  Times just doesn't work, and gives more frequent during day.    Pain Inventory Average Pain 10 Pain Right Now 10 My pain is constant, sharp, burning, dull, stabbing, tingling, and aching  In the last 24 hours, has pain interfered with the following? General activity 10 Relation with others 0 Enjoyment of life 10 What TIME of day is your pain at its worst? evening and night Sleep (in general) Good  Pain is worse with: walking, bending, sitting,  inactivity, standing, and some activites Pain improves with: rest, heat/ice, therapy/exercise, and medication Relief from Meds: 2      Family History  Problem Relation Age of Onset   Allergies Brother    Allergies Sister    Heart disease Father    Brain cancer Mother    Bone cancer Brother    Social History   Socioeconomic History   Marital status: Married    Spouse name: [name]   Number of children: 0   Years of education: Not on file   Highest education level: Not on file  Occupational History   Occupation: Programmer, [employer]  Tobacco Use   Smoking status: Never   Smokeless tobacco: Never  Vaping Use   Vaping Use: Never used  Substance and Sexual Activity   Alcohol use: No   Drug use: No   Sexual activity: Not on file  Other Topics Concern   Not on file  Social History Narrative   Lives with wife   Caffeine use: Sometimes tea   Right handed    Social Determinants of Health   Financial Resource Strain: Not on file  Food Insecurity: No Food Insecurity (05/27/2022)   Hunger Vital Sign    Worried About Running Out of Food in the Last Year: Never true    Ran Out of Food  in the Last Year: Never true  Transportation Needs: No Transportation Needs (05/27/2022)   PRAPARE - Hydrologist (Medical): No    Lack of Transportation (Non-Medical): No  Physical Activity: Not on file  Stress: Not on file  Social Connections: Not on file   Past Surgical History:  Procedure Laterality Date   CHOLECYSTECTOMY  2007   HEMORRHOIDECTOMY WITH HEMORRHOID BANDING     IR [place] FX REDUCE BONE BX UNI/BIL CANNULATION INC/IMAGING  01/10/2020   IR KYPHO LUMBAR INC FX REDUCE BONE BX UNI/BIL CANNULATION INC/IMAGING  05/31/2022   Past Medical History:  Diagnosis Date   Bilateral foot-drop 10/19/2018   Bursitis, trochanteric    Episodic   Carpal tunnel syndrome on right    Degenerative disk disease    l5-S1   FSH  (facioscapulohumeral muscular dystrophy) ([place]) 10/07/2017   Gait abnormality 10/19/2018   GERD (gastroesophageal reflux disease)    Hemorrhoids    with anal fissures   Left lateral epicondylitis    Parkinson's disease 10/19/2018   Skin cancer    right temple area   Ht '5\' 11"'$  (1.803 m)   BMI 20.92 kg/m   Opioid Risk Score:   Fall Risk Score:  `1  Depression screen [facility] 2/9     08/09/2022   10:17 AM  Depression screen PHQ 2/9  Decreased Interest 0  Down, Depressed, Hopeless 0  PHQ - 2 Score 0  Altered sleeping 0  Tired, decreased energy 1  Change in appetite 0  Feeling bad or failure about yourself  0  Trouble concentrating 0  Moving slowly or fidgety/restless 0  Suicidal thoughts 0  PHQ-9 Score 1  Difficult doing work/chores Not difficult at all    Review of Systems  Musculoskeletal:  Positive for back pain.       Both shoulders, both elbows  All other systems reviewed and are negative.      Objective:   Physical Exam   Awake, alert, appropriate, accompanied by wife; in transport w/c; NAD R anterior shoulder TTP- c/w biceps tendinitis-   MS: Biceps 2-/5 on R 2+/5 on L; triceps 2-/5 on R; WE 4-/5 B/L grip 5-/5 and FA 5-/5 B/L HF 4-/5; KE 4+/5; DF 1/5; and PF 2+/5 B/L  L olecranon bursitis -  Walking at 45 degrees now; not 75 degrees, which used to be Per wife-     Assessment & Plan:   Pt is a 67 yr old male with hx of FSH muscular dystrophy- dx'd in 1992- - also has Parkinson's disease, dilated cardiomyopathy- EF 41%; compression fx s/p fall s/p kyphoplasty at L5; also has disc protrusion Left L5/S1 and L disc protrusion at Left L1-2. And mild- moderate neuroforaminal stenosis L2-L5 on Left.   Here for f/u on his [name] muscular dystrophy.   Pt is DNR  Con't Laxatives to keep him going daily.   2. Increase Gabapentin-  1200 mg 2x/day x 1 week, then increase to 1200 mg 3x/day- for nerve and back pain- if that doesn't work, will try Lyrica.    3. Will  try to get steroid injection of R bicipital tendinitis.  After clinic today.  steroid injection was performed at bicipital tendons R using 1% plain Lidocaine and '40mg'$  /1cc of Kenalog. This was well tolerated.  Cleaned with betadine x3 and allowed to dry- then alcohol then injected using 27 gauge 1.5 inch needle- no bleeding or complications.    F/U in 3 months for  steroid injections of shoulders/biceps tendons.  Lidocaine will kick in 15 minutes- and wear off tonight- the steroid will kick in tomorrow within 24 hours and take up to 72 hours to fully kick in.   4.  We discussed Fentanyl patch- that she brought up- I suggest using a long acting pain medicine for him- he has chronic pain due to Muscular dystrophy AND due to disc protrusions in spine causing spinal stenosis/neuroforaminal stenosis. I think it's an appropriate medication to try- See Dr Koleen Nimrod says and go from there.   5. I will call Dr Mellody Drown to see what he thinks of MRI- [facility]-  Knowing pt has [place] muscular dystrophy. Per Dr Inda Merlin, doesn't have issues with heart.   6. Uses Foot up for B/L foot drop. Con't for gait.   7.  F/U in 3 months- double appt- muscular dystrophy   8. Patient here for trigger point injections for  Consent done and on chart.  Cleaned areas with alcohol and injected using a 27 gauge 1.5 inch needle  Injected  3cc Using 1% Lidocaine with no EPI  Upper traps B/L  Levators B/L  Posterior scalenes Middle scalenes- B/L  Splenius Capitus Pectoralis Major R only Rhomboids B/L  Infraspinatus Teres Major/minor Thoracic paraspinals Lumbar paraspinals Other injections-    Patient's level of pain prior was Current level of pain after injections is  There was no bleeding or complications.  Patient was advised to drink a lot of water on day after injections to flush system Will have increased soreness for 12-48 hours after injections.  Can use Lidocaine patches the day AFTER injections Can use  theracane on day of injections in places didn't inject Can use heating pad 4-6 hours AFTER injections

## 2022-09-18 NOTE — Patient Instructions (Signed)
Pt is a 67 yr old male with hx of FSH muscular dystrophy- dx'd in 1992- - also has Parkinson's disease, dilated cardiomyopathy- EF 41%; compression fx s/p fall s/p kyphoplasty at L5; also has disc protrusion Left L5/S1 and L disc protrusion at Left L1-2. And mild- moderate neuroforaminal stenosis L2-L5 on Left.   Here for f/u on his [place] muscular dystrophy.   Pt is DNR  Con't Laxatives to keep him going daily.   2. Increase Gabapentin-  1200 mg 2x/day x 1 week, then increase to 1200 mg 3x/day- for nerve and back pain- if that doesn't work, will try Lyrica.    3. Will try to get steroid injection of R bicipital tendinitis.  After clinic today.   4.  We discussed Fentanyl patch- that she brought up- I suggest using a long acting pain medicine for him- he has chronic pain due to Muscular dystrophy AND due to disc protrusions in spine causing spinal stenosis/neuroforaminal stenosis. I think it's an appropriate medication to try- See Dr Koleen Nimrod says and go from there.   5. I will call Dr Mellody Drown to see what he thinks of MRI- [facility]-  Knowing pt has [place] muscular dystrophy. Per Dr Inda Merlin, doesn't have issues with heart.   6. Uses Foot up for B/L foot drop. Con't for gait.   7.  F/U in 3 months- double appt- muscular dystrophy

## 2022-09-18 NOTE — Addendum Note (Signed)
Addended by: Courtney Heys on: 09/18/2022 12:32 PM   Modules accepted: Orders

## 2022-09-23 ENCOUNTER — Ambulatory Visit: Payer: Medicare Other | Attending: Physician Assistant | Admitting: Physical Therapy

## 2022-09-23 ENCOUNTER — Encounter: Payer: Self-pay | Admitting: Physical Therapy

## 2022-09-23 DIAGNOSIS — M6281 Muscle weakness (generalized): Secondary | ICD-10-CM | POA: Diagnosis not present

## 2022-09-23 DIAGNOSIS — R2681 Unsteadiness on feet: Secondary | ICD-10-CM | POA: Diagnosis present

## 2022-09-23 DIAGNOSIS — R2689 Other abnormalities of gait and mobility: Secondary | ICD-10-CM | POA: Diagnosis present

## 2022-09-23 NOTE — Therapy (Signed)
OUTPATIENT PHYSICAL THERAPY/AQUATIC THERAPY TREATMENT   Patient Name: Luis [name]. MRN: [id number] DOB:1956/05/16, 67 y.o., male Today's Date: 09/23/2022   PCP: Kathalene Frames, MD REFERRING PROVIDER: Cathlyn Parsons, PA-C  END OF SESSION:  PT End of Session - 09/23/22 2037     Visit Number 4    Number of Visits 16    Date for PT Re-Evaluation 10/17/22    Authorization Type Medicare/medicaid    PT Start Time 1320    PT Stop Time 1405    PT Time Calculation (min) 45 min    Equipment Utilized During Treatment Other (comment)   aquatic weights, water walker, pool noodle, large bar bell   Activity Tolerance Patient tolerated treatment well    Behavior During Therapy WFL for tasks assessed/performed             Past Medical History:  Diagnosis Date   Bilateral foot-drop 10/19/2018   Bursitis, trochanteric    Episodic   Carpal tunnel syndrome on right    Degenerative disk disease    l5-S1   FSH (facioscapulohumeral muscular dystrophy) ([place]) 10/07/2017   Gait abnormality 10/19/2018   GERD (gastroesophageal reflux disease)    Hemorrhoids    with anal fissures   Left lateral epicondylitis    Parkinson's disease 10/19/2018   Skin cancer    right temple area   Past Surgical History:  Procedure Laterality Date   CHOLECYSTECTOMY  2007   HEMORRHOIDECTOMY WITH HEMORRHOID BANDING     IR KYPHO LUMBAR INC FX REDUCE BONE BX UNI/BIL CANNULATION INC/IMAGING  01/10/2020   IR KYPHO LUMBAR INC FX REDUCE BONE BX UNI/BIL CANNULATION INC/IMAGING  05/31/2022   Patient Active Problem List   Diagnosis Date Noted   Spinal stenosis of lumbar region with neurogenic claudication 09/18/2022   Low serum albumin    Lumbar compression fracture ([place]) 06/04/2022   Compression fracture of lumbar vertebra ([place]) 05/27/2022   Other chronic pain 10/18/2021   Chronic constipation 10/18/2021   Gait abnormality 10/19/2018   Bilateral foot-drop 10/19/2018   Parkinson's disease 10/19/2018    Swelling of lower leg - Right  03/06/2018   Tremor of right hand 12/19/2017   FSH (facioscapulohumeral muscular dystrophy) ([place]) 10/07/2017   Dilated cardiomyopathy ([place]) 05/02/2017   Upper airway cough syndrome 03/18/2014   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  03/17/2014   Abnormal chest xray 03/16/2014    ONSET DATE: increased issue since recent hx of falling 05/25/22  REFERRING DIAG: G20.A1 (ICD-10-CM) - Parkinson disease G71.00 (ICD-10-CM) - Muscular dystrophy, unspecified S32.000A (ICD-10-CM) - Compression of lumbar vertebra (HCC)  THERAPY DIAG:  Muscle weakness (generalized)  Other abnormalities of gait and mobility  Unsteadiness on feet  Rationale for Evaluation and Treatment: Rehabilitation  SUBJECTIVE:  SUBJECTIVE STATEMENT: Nothing much going on, right shoulder and left elbow are still flared up  Pt accompanied by: significant other  PERTINENT HISTORY: 67 y.o. male with medical history significant of Parkinson's disease on [place], Chronic severe neuropathic pain, FSHD Muscular dystrophy followed by Dr [name] at [facility], chronic debility uses walker who was hospitalized with fall from standing after which he has had increase pain in his lumbar region. Dx of L3 compression fx, R middle finger fx, s/p kyphoplasty 05/31/22, pt was d/c from rehab on 06/12/22        PAIN:  Are you having pain? Yes: NPRS scale: 5/10 Pain location: right shoulder, left elbow Pain description: intense  Aggravating factors: staying still Relieving factors: movement  PRECAUTIONS: Fall  WEIGHT BEARING RESTRICTIONS: No  FALLS: Has patient fallen in last 6 months? Yes. Number of falls 2  LIVING ENVIRONMENT: Lives with: lives with their spouse Lives in: House/apartment Stairs: Has a ramp and a lift.  Has  following equipment at home: Gilford Rile - 2 wheeled, Wheelchair (power), and Lift seat, Lift chair, in the process of trying to get a hospital bed.  PLOF: Independent with household mobility with device, Needs assistance with ADLs, Needs assistance with homemaking, and Needs assistance with gait Pt reports he is typically able to use his lift chair and walk to/from bathroom.  Has elevating toilet LIFT.   PATIENT GOALS: try aquatic therapy  OBJECTIVE:   AQUATIC THERAPY AT [place] - pool temperature 90 degrees;  Sherlyn Lees, PT assisted with treatment and transfers   Patient seen for aquatic therapy today.  Treatment took place in water 3.5-4.5 feet deep depending upon activity.  Pt entered & exited the pool via chair lift; pt was transferred to/from wheelchair/chair lift with mod to min assist +2 using a squat pivot transfer.  Pt gait trained in 4' water depth with use of water walker 18' x 2 reps; 3# weights were then placed on each leg to decrease buoyancy in order to increase weight bearing to facilitate more upright posture in standing;  large bar bell only was trialed but this did not provide enough support so switched back to use of water walker  Pt gait trained additional 18' x 2 reps with use of 3# weights on each leg with +2 mod to min assist with water walker Pt sat on bench in pool - held large bar bell - performed trunk flexion/extension 10 reps; then performed partial sit to stand with cues to lean anteriorly, push through legs and lift hips off bench for hip extensor strengthening - 10 reps with mod assist +1  Attempted tall kneeling but pt reported discomfort on shins with use of aquatic step on pool floor; attempted tall kneeling on steps with use of kick board for padding for increased comfort but unable to keep board in position due to buoyancy of board with pt unable to weight bear sufficiently in tall kneeling to maintain board in position Pt sat on 3rd step in pool - performed  LAQ's RLE and LLE with 3# weight on each leg  approx. 10 reps each; performed bicycling LE's in seated position on steps with min assist  Pt performed supine exercises - hip abduction/adduction 10 reps with manual resistance by PT assisting Sherlyn Lees, PT) ; performed single knee to chest 10 reps and then bil. Knee to chest 10 reps with manual resistance  Pt gait trained at end of session 18' x 2 reps across width of pool with use of  large yellow noodle in front of patient for support & buoyancy and with use of large single bar bell with +2 min assist  Pt requires buoyancy of water for support for reduced fall risk and for unloading/reduced stress on joints as pt able to tolerate increased standing and ambulation in water compared to that on land. Buoyancy of water provides spinal decompression which reduces pain with weight bearing in the aquatic environment.  Viscosity of water is needed for resistance for strengthening and current of water provides perturbations for challenge for balance training     PATIENT EDUCATION: Education details: discussion of access to aquatic therapy and need for a therapist who is knowledgeable in this area to determine if appropriate for this environment as he will likely require use of mechanical lift for pool access and two-assist for safety. Person educated: Patient and Spouse Education method: Explanation Education comprehension: verbalized understanding  HOME EXERCISE PROGRAM: Access Code: [place] URL: [email or web address] Date: 08/20/2022 Prepared by: Sherlyn Lees  Exercises - Seated Hamstring Curls with Resistance  - 1 x daily - 7 x weekly - 3 sets - 5-8 reps - Seated Hip Abduction with Resistance  - 1 x daily - 7 x weekly - 3 sets - 10 reps  GOALS: Goals reviewed with patient? Yes  SHORT TERM GOALS: Target date: 08/21/22    Pt will be independent with initial HEP with spouse's supervision in order to maintain BLE strength to  improve functional mobility Baseline: Goal status: INITIAL   2.  Pt will perform sit <> stands with mod A in order to demo improved functional mobility for transfers Baseline:  Goal status: INITIAL   3.  Pt will perform stand pivot transfer with RW with mod A in order to demo improved functional transfers for home.  Baseline:  Goal status: INITIAL   4.  Pt will perform dynamic sitting balance for at least 5 minutes with supervision in order to demo improved sitting balance/tolerance for ADLs.  Baseline:  Goal status: INITIAL  LONG TERM GOALS: Target date: 09/18/22   Pt will be independent with final HEP with spouse's supervision in order to build upon functional gains made in therapy. Baseline:  Goal status: INITIAL   2.   Pt will perform sit <> stands with min A in order to demo improved functional mobility for transfers and decreased caregiver burden. Baseline:  Goal status: INITIAL   3.  Pt will ambulate at least 95' with RW and supervision/min guard in order to demo improved household mobility.  Baseline: 45 ft min A Goal status: INITIAL   4.  Pt will perform bed mobility with min guard in order to improve functional mobility/decr caregiver burden.  Baseline:  Goal status: INITIAL   ASSESSMENT:  CLINICAL IMPRESSION: Aquatic PT session focused on gait training with use of water walker initially for max support with use of 3# weights on each leg to decrease buoyancy to increase weight bearing and also on trunk and hip extension to facilitate upright posture.  Pt required less assistance with gait training in 4' water depth with 2nd PT, [employer], fasciliating bil. Hip extension in standing and during ambulation.  Pt did better with use of noodle positioned in front of him with bil. UE support on large single bar bell with +2 min assist only needed with gait training at end of session.  Pt tolerated aquatic exercises well with min. C/o fatigue at end of session.  Cont with  POC.   OBJECTIVE IMPAIRMENTS: Abnormal  gait, decreased activity tolerance, decreased balance, decreased coordination, decreased endurance, decreased mobility, difficulty walking, decreased strength, impaired flexibility, impaired tone, postural dysfunction, and pain.   ACTIVITY LIMITATIONS: lifting, sitting, standing, squatting, transfers, bed mobility, bathing, toileting, dressing, reach over head, and locomotion level  PARTICIPATION LIMITATIONS:  self-care and reduced ability to ambulate household distances per PLOF  PERSONAL FACTORS: Past/current experiences, Time since onset of injury/illness/exacerbation, and 3+ comorbidities: PD, MD, lumbar fx  are also affecting patient's functional outcome.   REHAB POTENTIAL: Good  CLINICAL DECISION MAKING: Evolving/moderate complexity  EVALUATION COMPLEXITY: Moderate  PLAN:  PT FREQUENCY: 1-2x/week  PT DURATION: 10 weeks  PLANNED INTERVENTIONS: Therapeutic exercises, Therapeutic activity, Neuromuscular re-education, Balance training, Gait training, Patient/Family education, Self Care, Joint mobilization, Stair training, Vestibular training, Canalith repositioning, Orthotic/Fit training, DME instructions, Aquatic Therapy, Dry Needling, Electrical stimulation, Wheelchair mobility training, Cryotherapy, Moist heat, Taping, and Manual therapy  PLAN FOR NEXT SESSION: cont. aquatic therapy    8:50 PM, 09/23/22 Guido Sander, PT Physical Therapist- [place] [phone]

## 2022-09-30 ENCOUNTER — Telehealth: Payer: Self-pay | Admitting: Physical Medicine and Rehabilitation

## 2022-09-30 ENCOUNTER — Ambulatory Visit: Payer: Medicare Other | Admitting: Physical Therapy

## 2022-09-30 NOTE — Telephone Encounter (Signed)
Patient's wife called in states the injections that we administered during his visit has helped relieve the pain at least by 50 % , patients wife would like to know if injections could be provided more frequently or how often should they expect them to be .

## 2022-10-01 ENCOUNTER — Encounter: Payer: Self-pay | Admitting: Physical Medicine and Rehabilitation

## 2022-10-02 NOTE — Telephone Encounter (Signed)
Can try and see q6 weeks- might need wait list or something initially, but then schedule out q6 weeks for injections- thanks- ML

## 2022-10-03 ENCOUNTER — Other Ambulatory Visit: Payer: Medicare Other

## 2022-10-03 VITALS — BP 114/66 | HR 50 | Temp 99.6°F

## 2022-10-03 DIAGNOSIS — Z515 Encounter for palliative care: Secondary | ICD-10-CM
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
## 2022-10-04 NOTE — Progress Notes (Signed)
PATIENT NAME: Luis E Delmar Jr. DOB: [birth year] MRN: [id number]  PRIMARY CARE PROVIDER: Kathalene Frames, MD  RESPONSIBLE PARTY:  Acct ID - Guarantor Home Phone Work Phone Relationship Acct Type  [id number] - [name],Luis [phone] Self P/F     [address], [name], [place] [address]     Palliative Care Follow up Encounter Note   Completed home visit with Luis [name], Luis [name] (wife) also present     HISTORY OF PRESENT ILLNESS: 67 y.o. 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 There is also a small extraforaminal disc protrusion on left at L5-S1.    Cognitive: A&O x4   Appetite: 3 meals and snacks daily   Mobility: ambulates w/walker; unsteady gait; feet turn inward; denies falls at this time   Sleeping Pattern: has hospital bed; sleeps well d/t meds     Respiratory: when pt has SOB, if too much will go on Trilogy machine; no SOB at this time  Pain: went to Aqua therapy last Monday; felt good during therapy but pain was 10/10 the day after and today is 10/10; didn't take meds d/t our visit/encouraged and promised not to do this again; takes Dilaudid for pain  Palliative Care/ Hospice: LPN explained role and purpose of palliative care including visit frequency. Also discussed benefits of hospice care as well as the differences between the two with patient.   Answers questions when able; sees MD doctor q34mo; has to go to multiple locations; L eye sclera red; L elbow accumulates fluid - none noted at this time; has to get it drained when it accumulates; wife reports tremors on R side - none present at this time; Dr WJannifer Franklin initially dx MD; on Trilogy machine at night  Goals of Care: to strengthen his body for as long as possible to get back to the way he was before hospitalization  CODE STATUS: DNR ADVANCED DIRECTIVES: N MOST FORM: No PPS: 50%   PHYSICAL EXAM:   VITALS: Today's Vitals   10/03/22 1029  BP: 114/66  Pulse: (!) 50  Temp: 99.6 F (37.6 C)  TempSrc: Temporal  SpO2: 96%  PainSc: 10-Worst pain ever  PainLoc: Back    LUNGS: diminished lung sounds CARDIAC: Cor RRR EXTREMITIES: atrophy noted; limited BUE ROM SKIN: normal  NEURO: negative     Job Holtsclaw CGeorgann Housekeeper LPN

## 2022-10-07 ENCOUNTER — Ambulatory Visit: Payer: Medicare Other | Attending: Physician Assistant | Admitting: Physical Therapy

## 2022-10-07 DIAGNOSIS — M6281 Muscle weakness (generalized): Secondary | ICD-10-CM | POA: Insufficient documentation

## 2022-10-07 DIAGNOSIS — R262 Difficulty in walking, not elsewhere classified: Secondary | ICD-10-CM | POA: Diagnosis present

## 2022-10-07 DIAGNOSIS — G7102 Facioscapulohumeral muscular dystrophy: Secondary | ICD-10-CM | POA: Insufficient documentation

## 2022-10-07 DIAGNOSIS — R2689 Other abnormalities of gait and mobility: Secondary | ICD-10-CM | POA: Insufficient documentation

## 2022-10-07 DIAGNOSIS — R2681 Unsteadiness on feet: Secondary | ICD-10-CM | POA: Diagnosis present

## 2022-10-08 ENCOUNTER — Encounter: Payer: Self-pay | Admitting: Physical Therapy

## 2022-10-08 NOTE — Therapy (Signed)
OUTPATIENT PHYSICAL THERAPY/AQUATIC THERAPY TREATMENT   Patient Name: Luis [name] Lauro [name]. MRN: [id number] DOB:September 07, 1955, 67 y.o., male Today's Date: 10/08/2022   PCP: Kathalene Frames, MD REFERRING PROVIDER: Cathlyn Parsons, PA-C  END OF SESSION:  PT End of Session - 10/08/22 1946     Visit Number 5    Number of Visits 16    Date for PT Re-Evaluation 10/17/22    Authorization Type Medicare/medicaid    PT Start Time 1315    PT Stop Time 1400    PT Time Calculation (min) 45 min    Equipment Utilized During Treatment Other (comment)   aquatic weights, pool noodles, large bar bell   Activity Tolerance Patient tolerated treatment well    Behavior During Therapy WFL for tasks assessed/performed             Past Medical History:  Diagnosis Date   Bilateral foot-drop 10/19/2018   Bursitis, trochanteric    Episodic   Carpal tunnel syndrome on right    Degenerative disk disease    l5-S1   FSH (facioscapulohumeral muscular dystrophy) ([place]) 10/07/2017   Gait abnormality 10/19/2018   GERD (gastroesophageal reflux disease)    Hemorrhoids    with anal fissures   Left lateral epicondylitis    Parkinson's disease 10/19/2018   Skin cancer    right temple area   Past Surgical History:  Procedure Laterality Date   CHOLECYSTECTOMY  2007   HEMORRHOIDECTOMY WITH HEMORRHOID BANDING     IR KYPHO LUMBAR INC FX REDUCE BONE BX UNI/BIL CANNULATION INC/IMAGING  01/10/2020   IR KYPHO LUMBAR INC FX REDUCE BONE BX UNI/BIL CANNULATION INC/IMAGING  05/31/2022   Patient Active Problem List   Diagnosis Date Noted   Spinal stenosis of lumbar region with neurogenic claudication 09/18/2022   Low serum albumin    Lumbar compression fracture ([place]) 06/04/2022   Compression fracture of lumbar vertebra ([place]) 05/27/2022   Other chronic pain 10/18/2021   Chronic constipation 10/18/2021   Gait abnormality 10/19/2018   Bilateral foot-drop 10/19/2018   Parkinson's disease 10/19/2018   Swelling of  lower leg - Right  03/06/2018   Tremor of right hand 12/19/2017   FSH (facioscapulohumeral muscular dystrophy) ([place]) 10/07/2017   Dilated cardiomyopathy ([place]) 05/02/2017   Upper airway cough syndrome 03/18/2014   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  03/17/2014   Abnormal chest xray 03/16/2014    ONSET DATE: increased issue since recent hx of falling 05/25/22  REFERRING DIAG: G20.A1 (ICD-10-CM) - Parkinson disease G71.00 (ICD-10-CM) - Muscular dystrophy, unspecified S32.000A (ICD-10-CM) - Compression of lumbar vertebra (HCC)  THERAPY DIAG:  Muscle weakness (generalized)  Other abnormalities of gait and mobility  Unsteadiness on feet  Rationale for Evaluation and Treatment: Rehabilitation  SUBJECTIVE:  SUBJECTIVE STATEMENT: Pt presents for aquatic PT session (2nd one); wife states pt was in much pain after previous session 2 weeks ago - had to cancel aquatic PT appt last Monday due to pain; wife states pt enjoyed it but overdid it some. Is going to possibly have injections done per wife's report.   Pt accompanied by: significant other  PERTINENT HISTORY: 67 y.o. male with medical history significant of Parkinson's disease on S[place], Chronic severe neuropathic pain, FSHD Muscular dystrophy followed by Dr [name] at [facility] clinic, chronic debility uses walker who was hospitalized with fall from standing after which he has had increase pain in his lumbar region. Dx of L3 compression fx, R middle finger fx, s/p kyphoplasty 05/31/22, pt was d/c from rehab on 06/12/22        PAIN:  Are you having pain? Yes: NPRS scale: 5/10 Pain location: right shoulder, left elbow Pain description: intense  Aggravating factors: staying still Relieving factors: movement  PRECAUTIONS: Fall  WEIGHT BEARING RESTRICTIONS:  No  FALLS: Has patient fallen in last 6 months? Yes. Number of falls 2  LIVING ENVIRONMENT: Lives with: lives with their spouse Lives in: House/apartment Stairs: Has a ramp and a lift.  Has following equipment at home: Gilford Rile - 2 wheeled, Wheelchair (power), and Lift seat, Lift chair, in the process of trying to get a hospital bed.  PLOF: Independent with household mobility with device, Needs assistance with ADLs, Needs assistance with homemaking, and Needs assistance with gait Pt reports he is typically able to use his lift chair and walk to/from bathroom.  Has elevating toilet LIFT.   PATIENT GOALS: try aquatic therapy  OBJECTIVE:   AQUATIC THERAPY AT [place] - pool temperature 90 degrees;  Sherlyn Lees, PT assisted with treatment and transfers   Patient seen for aquatic therapy today.  Treatment took place in water 3.5-4.5 feet deep depending upon activity.  Pt entered & exited the pool via chair lift; pt was transferred to/from wheelchair/chair lift with mod assist +1 using lateral scoot transfer.  Attempted standing at side of pool at start of session for LE weight bearing and standing balance - mod assist  Pt gait trained in 4' water depth with use of 2 large yellow pool noodles and use of large single bar bell for UE support - 18' x 4 reps; 3# weights placed on each leg to decrease buoyancy in order to increase weight bearing to facilitate standing balance and upright posture - flotation belt was positioned around hips to allow facilitation of hip extension during water walking   Pt sat on 3rd step in pool for rest periods for back pain management prn  Pt performed supine exercises - hip abduction/adduction 30 reps with manual resistance by PT assisting Sherlyn Lees, PT) ; performed bicycling LE's (knee to chest) with 3# weights on each leg for hip flexor/extensor strengthening   In supine position - pt performed simulated leg press exercise - pt was supported by PT with feet  on pool wall- pushed into hip and knee flexion - pt pushed away from wall for hip and knee extensor strengthening 10 reps  Pt supported in supine position - gentle swaying for relaxation and pain reduction in low back - buoyancy assisted for spinal decompression  Pt requires buoyancy of water for support for reduced fall risk and for unloading/reduced stress on joints as pt able to tolerate increased standing and ambulation in water compared to that on land. Buoyancy of water provides spinal decompression which reduces  pain with weight bearing in the aquatic environment.  Viscosity of water is needed for resistance for strengthening and current of water provides perturbations for challenge for balance training     PATIENT EDUCATION: Education details: discussion of access to aquatic therapy and need for a therapist who is knowledgeable in this area to determine if appropriate for this environment as he will likely require use of mechanical lift for pool access and two-assist for safety. Person educated: Patient and Spouse Education method: Explanation Education comprehension: verbalized understanding  HOME EXERCISE PROGRAM: Access Code: [place] URL: [email or web address] Date: 08/20/2022 Prepared by: Sherlyn Lees  Exercises - Seated Hamstring Curls with Resistance  - 1 x daily - 7 x weekly - 3 sets - 5-8 reps - Seated Hip Abduction with Resistance  - 1 x daily - 7 x weekly - 3 sets - 10 reps  GOALS: Goals reviewed with patient? Yes  SHORT TERM GOALS: Target date: 08/21/22    Pt will be independent with initial HEP with spouse's supervision in order to maintain BLE strength to improve functional mobility Baseline: Goal status: INITIAL   2.  Pt will perform sit <> stands with mod A in order to demo improved functional mobility for transfers Baseline:  Goal status: INITIAL   3.  Pt will perform stand pivot transfer with RW with mod A in order to demo improved  functional transfers for home.  Baseline:  Goal status: INITIAL   4.  Pt will perform dynamic sitting balance for at least 5 minutes with supervision in order to demo improved sitting balance/tolerance for ADLs.  Baseline:  Goal status: INITIAL  LONG TERM GOALS: Target date: 09/18/22   Pt will be independent with final HEP with spouse's supervision in order to build upon functional gains made in therapy. Baseline:  Goal status: INITIAL   2.   Pt will perform sit <> stands with min A in order to demo improved functional mobility for transfers and decreased caregiver burden. Baseline:  Goal status: INITIAL   3.  Pt will ambulate at least 47' with RW and supervision/min guard in order to demo improved household mobility.  Baseline: 45 ft min A Goal status: INITIAL   4.  Pt will perform bed mobility with min guard in order to improve functional mobility/decr caregiver burden.  Baseline:  Goal status: INITIAL   ASSESSMENT:  CLINICAL IMPRESSION: Aquatic PT session focused on gait training with use of 2 large yellow noodles and single bar bell to allow trunk flexion to minimize pain with upright standing during ambulation.  Aquatic session was focused more on pain reduction for low back with gentle strengthening exercises compared to focus in previous aquatic therapy session 2 weeks ago.  3# weights continued to be used to decrease floatation of LE's for increased weight bearing.   Cont with POC.   OBJECTIVE IMPAIRMENTS: Abnormal gait, decreased activity tolerance, decreased balance, decreased coordination, decreased endurance, decreased mobility, difficulty walking, decreased strength, impaired flexibility, impaired tone, postural dysfunction, and pain.   ACTIVITY LIMITATIONS: lifting, sitting, standing, squatting, transfers, bed mobility, bathing, toileting, dressing, reach over head, and locomotion level  PARTICIPATION LIMITATIONS:  self-care and reduced ability to ambulate household  distances per PLOF  PERSONAL FACTORS: Past/current experiences, Time since onset of injury/illness/exacerbation, and 3+ comorbidities: PD, MD, lumbar fx  are also affecting patient's functional outcome.   REHAB POTENTIAL: Good  CLINICAL DECISION MAKING: Evolving/moderate complexity  EVALUATION COMPLEXITY: Moderate  PLAN:  PT FREQUENCY: 1-2x/week  PT DURATION: 10 weeks  PLANNED INTERVENTIONS: Therapeutic exercises, Therapeutic activity, Neuromuscular re-education, Balance training, Gait training, Patient/Family education, Self Care, Joint mobilization, Stair training, Vestibular training, Canalith repositioning, Orthotic/Fit training, DME instructions, Aquatic Therapy, Dry Needling, Electrical stimulation, Wheelchair mobility training, Cryotherapy, Moist heat, Taping, and Manual therapy  PLAN FOR NEXT SESSION: cont. aquatic therapy    Guido Sander, PT Physical Therapist- [place] [phone] 7:53 PM, 10/08/22

## 2022-10-09 ENCOUNTER — Telehealth: Payer: Self-pay | Admitting: Physical Medicine and Rehabilitation

## 2022-10-09 DIAGNOSIS — M48062 Spinal stenosis, lumbar region with neurogenic claudication: Secondary | ICD-10-CM

## 2022-10-09 NOTE — Telephone Encounter (Signed)
Patient's spouse called states that during the last visit 1/17, patient was to possibly be sent to Dr Mellody Drown , MRI was to be reviewed to see if doctor would see him for an evaluation for surgery .

## 2022-10-10 ENCOUNTER — Ambulatory Visit: Payer: Medicare Other | Admitting: Neurology

## 2022-10-10 NOTE — Addendum Note (Signed)
Addended by: Courtney Heys on: 10/10/2022 03:08 PM   Modules accepted: Orders

## 2022-10-14 ENCOUNTER — Ambulatory Visit: Payer: Medicare Other | Admitting: Physical Therapy

## 2022-10-16 ENCOUNTER — Ambulatory Visit: Payer: Medicare Other

## 2022-10-16 DIAGNOSIS — M6281 Muscle weakness (generalized): Secondary | ICD-10-CM

## 2022-10-16 DIAGNOSIS — R2689 Other abnormalities of gait and mobility: Secondary | ICD-10-CM

## 2022-10-16 DIAGNOSIS — R2681 Unsteadiness on feet: Secondary | ICD-10-CM

## 2022-10-16 DIAGNOSIS — G7102 Facioscapulohumeral muscular dystrophy: Secondary | ICD-10-CM

## 2022-10-16 DIAGNOSIS — R262 Difficulty in walking, not elsewhere classified: Secondary | ICD-10-CM

## 2022-10-16 NOTE — Therapy (Signed)
OUTPATIENT PHYSICAL TREATMENT, Progress note, and Recertification   Patient Name: Luis Hull Balgobin Jr. MRN: [id number] DOB:[birth year], 67 y.o., male Today's Date: 10/16/2022   PCP: Kathalene Frames, MD REFERRING PROVIDER: Cathlyn Parsons, PA-C  Progress Note Reporting Period 07/23/22 to 10/16/22  See note below for Objective Data and Assessment of Progress/Goals.     END OF SESSION:  PT End of Session - 10/16/22 1617     Visit Number 6    Number of Visits 16    Date for PT Re-Evaluation 12/25/22    Authorization Type Medicare/medicaid    PT Start Time 1615    PT Stop Time 1700    PT Time Calculation (min) 45 min    Equipment Utilized During Treatment Other (comment)   aquatic weights, pool noodles, large bar bell   Activity Tolerance Patient tolerated treatment well    Behavior During Therapy WFL for tasks assessed/performed             Past Medical History:  Diagnosis Date   Bilateral foot-drop 10/19/2018   Bursitis, trochanteric    Episodic   Carpal tunnel syndrome on right    Degenerative disk disease    l5-S1   FSH (facioscapulohumeral muscular dystrophy) ([place]) 10/07/2017   Gait abnormality 10/19/2018   GERD (gastroesophageal reflux disease)    Hemorrhoids    with anal fissures   Left lateral epicondylitis    Parkinson's disease 10/19/2018   Skin cancer    right temple area   Past Surgical History:  Procedure Laterality Date   CHOLECYSTECTOMY  2007   HEMORRHOIDECTOMY WITH HEMORRHOID BANDING     IR KYPHO LUMBAR INC FX REDUCE BONE BX UNI/BIL CANNULATION INC/IMAGING  01/10/2020   IR KYPHO LUMBAR INC FX REDUCE BONE BX UNI/BIL CANNULATION INC/IMAGING  05/31/2022   Patient Active Problem List   Diagnosis Date Noted   Spinal stenosis of lumbar region with neurogenic claudication 09/18/2022   Low serum albumin    Lumbar compression fracture ([place]) 06/04/2022   Compression fracture of lumbar vertebra ([place]) 05/27/2022   Other chronic pain 10/18/2021    Chronic constipation 10/18/2021   Gait abnormality 10/19/2018   Bilateral foot-drop 10/19/2018   Parkinson's disease 10/19/2018   Swelling of lower leg - Right  03/06/2018   Tremor of right hand 12/19/2017   FSH (facioscapulohumeral muscular dystrophy) ([place]) 10/07/2017   Dilated cardiomyopathy ([place]) 05/02/2017   Upper airway cough syndrome 03/18/2014   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  03/17/2014   Abnormal chest xray 03/16/2014    ONSET DATE: increased issue since recent hx of falling 05/25/22  REFERRING DIAG: G20.A1 (ICD-10-CM) - Parkinson disease G71.00 (ICD-10-CM) - Muscular dystrophy, unspecified S32.000A (ICD-10-CM) - Compression of lumbar vertebra (HCC)  THERAPY DIAG:  Muscle weakness (generalized)  Other abnormalities of gait and mobility  Unsteadiness on feet  Difficulty in walking, not elsewhere classified  FSH (facioscapulohumeral muscular dystrophy) ([place])  Rationale for Evaluation and Treatment: Rehabilitation  SUBJECTIVE:  SUBJECTIVE STATEMENT: Check up at MD clinic yesterday. Continuing to be limited by right shoulder pain which affects transfers, gait. Back pain continues which limits standing and gait tolerance  Pt accompanied by: significant other  PERTINENT HISTORY: 67 y.o. male with medical history significant of Parkinson's disease on [place], Chronic severe neuropathic pain, FSHD Muscular dystrophy followed by Dr [name] at [facility] clinic, chronic debility uses walker who was hospitalized with fall from standing after which he has had increase pain in his lumbar region. Dx of L3 compression fx, R middle finger fx, s/p kyphoplasty 05/31/22, pt was d/c from rehab on 06/12/22        PAIN:  Are you having pain? Yes: NPRS scale: 5/10 Pain location: right shoulder, left elbow Pain  description: intense  Aggravating factors: staying still Relieving factors: movement  PRECAUTIONS: Fall  WEIGHT BEARING RESTRICTIONS: No  FALLS: Has patient fallen in last 6 months? Yes. Number of falls 2  LIVING ENVIRONMENT: Lives with: lives with their spouse Lives in: House/apartment Stairs: Has a ramp and a lift.  Has following equipment at home: Gilford Rile - 2 wheeled, Wheelchair (power), and Lift seat, Lift chair, in the process of trying to get a hospital bed.  PLOF: Independent with household mobility with device, Needs assistance with ADLs, Needs assistance with homemaking, and Needs assistance with gait Pt reports he is typically able to use his lift chair and walk to/from bathroom.  Has elevating toilet LIFT.   PATIENT GOALS: continue aquatic therapy, improve standing posture/tolerance  OBJECTIVE:    TODAY'S TREATMENT: 10/16/22 Activity Comments  Pt/spouse education/demonstration/fitting of foot-up device To improve foot clearance during transfers/gait--improved performance. These are his personal devices that were adjusted  Functional mobility training -Addressing STG/LTG--see below -car transfer: assist/facilitation for sit-stand w/ mod-max A  Pt/spouse education in continued sessions and collaboration in Advice worker                PATIENT EDUCATION: Education details: discussion of continued POC details  Person educated: Patient and Spouse Education method: Explanation Education comprehension: verbalized understanding  HOME EXERCISE PROGRAM: Access Code: [place] URL: [email or web address] Date: 08/20/2022 Prepared by: Sherlyn Lees  Exercises - Seated Hamstring Curls with Resistance  - 1 x daily - 7 x weekly - 3 sets - 5-8 reps - Seated Hip Abduction with Resistance  - 1 x daily - 7 x weekly - 3 sets - 10 reps  Manual Muscle Testing: assessed in sitting  Hip flexion: 3+/5 bilat Hip ext: 3-/5 Hip adduction: 2/5 Hip abduction:  4-/5 Knee extension: 4-/5 Knee flexion: 3-/5 Ankle dorsiflexion: 2-/5 Ankle plantarflexion: not assessed by good muscle bulk palpated and able to complete full ROM in sitting  GOALS: Goals reviewed with patient? Yes  SHORT TERM GOALS: Target date: 11/20/2022    Pt will be independent with initial HEP with spouse's supervision in order to maintain BLE strength to improve functional mobility Baseline: Goal status: IN PROGRESS   2.  Pt will perform sit <> stands with mod A in order to demo improved functional mobility for transfers Baseline: max A Goal status: IN PROGRESS   3.  Pt will perform stand pivot transfer with RW with mod A in order to demo improved functional transfers for home.  Baseline: sit-stand max A, pivoting w/ CGA-min A Goal status: GOAL MET   4.  Pt will perform static standing w/ upright posture x 30 sec intervals with UE support to improve social interaction and environmental scanning Baseline: unable Goal status: IN PROGRESS  LONG TERM GOALS: Target date: 12/25/2022     Pt will be independent with final HEP with spouse's supervision in order to build upon functional gains made in therapy (which may include aquatic intervention) Baseline:  Goal status: IN PROGRESS   2.   Pt will perform sit <> stands with min A in order to demo improved functional mobility for transfers and decreased caregiver burden. Baseline: Max A Goal status: IN PROGRESS   3.  Pt will ambulate at least 100' with RW and supervision/min guard in order to demo improved household mobility.  Baseline: 53 ft CGA Goal status: REVISED   4.  Pt will perform bed mobility with min guard in order to improve functional mobility/decr caregiver burden.  Baseline:  Goal status: IN PROGRESS   ASSESSMENT:  CLINICAL IMPRESSION: Pt and spouse return to clinic for treatment/recertification and able to perform battery of functional mobility for assessment.  Continues to require max A for sit to stand  due to nature/distribution of LE weakness but demo improved stability and safety with pivot aspect of transfers following adjustment of foot-up orthotics with increased foot clearance appreciated.  Gait trial w/ RW reveals less need for physical assistance and able to traverse distance of 53 ft (albeit with excessive trunk flexion) but no physical assist other than guarding for safety meeting initial LTG which is now revised to increase distance for greater gait endurance for safety in household navigation. Able to initiate aquatic therapy interventions during this period which will likely assist patient in improving overall activity tolerance and enabling greater postural control which will hopefully complement land-based intervention strategies.  Sessions have been limited due to logistics, conflicting appointments, and instances of adverse reactions to PT sessions.  Patient and relevant clinician's have a much better understanding of body habitus and interaction of conditions and patient will continue to benefit from PT services to progress POC details and improve/maintain functional mobility to reduce level of assistance from caregivers and reduce risk for falls.    OBJECTIVE IMPAIRMENTS: Abnormal gait, decreased activity tolerance, decreased balance, decreased coordination, decreased endurance, decreased mobility, difficulty walking, decreased strength, impaired flexibility, impaired tone, postural dysfunction, and pain.   ACTIVITY LIMITATIONS: lifting, sitting, standing, squatting, transfers, bed mobility, bathing, toileting, dressing, reach over head, and locomotion level  PARTICIPATION LIMITATIONS:  self-care and reduced ability to ambulate household distances per PLOF  PERSONAL FACTORS: Past/current experiences, Time since onset of injury/illness/exacerbation, and 3+ comorbidities: PD, MD, lumbar fx  are also affecting patient's functional outcome.   REHAB POTENTIAL: Good  CLINICAL DECISION  MAKING: Evolving/moderate complexity  EVALUATION COMPLEXITY: Moderate  PLAN:  PT FREQUENCY: 1x/week  PT DURATION: 10 weeks  PLANNED INTERVENTIONS: Therapeutic exercises, Therapeutic activity, Neuromuscular re-education, Balance training, Gait training, Patient/Family education, Self Care, Joint mobilization, Stair training, Vestibular training, Canalith repositioning, Orthotic/Fit training, DME instructions, Aquatic Therapy, Dry Needling, Electrical stimulation, Wheelchair mobility training, Cryotherapy, Moist heat, Taping, and Manual therapy  PLAN FOR NEXT SESSION: cont. aquatic therapy, upright standing posture/techniques, standing frame?   5:36 PM, 10/16/22 M. Sherlyn Lees, PT, DPT Physical Therapist- [place] Office Number: [phone]

## 2022-10-21 ENCOUNTER — Ambulatory Visit: Payer: Medicare Other | Admitting: Physical Therapy

## 2022-10-21 NOTE — Telephone Encounter (Signed)
LMTCB x1.ss 

## 2022-10-22 NOTE — Telephone Encounter (Signed)
Spoke with patient's spouse and let her know per Dr. Florentina Jenny discussion with Dr.Osteguard. She wants to know if you are going to place a referral? Please advise?

## 2022-10-30 ENCOUNTER — Ambulatory Visit: Payer: Medicare Other

## 2022-11-01 ENCOUNTER — Other Ambulatory Visit: Payer: Medicare Other

## 2022-11-01 DIAGNOSIS — Z515 Encounter for palliative care: Secondary | ICD-10-CM

## 2022-11-01 NOTE — Progress Notes (Signed)
PATIENT NAME: Luis E Stavola Jr. DOB: 13-Jul-1956 MRN: [id number]  PRIMARY CARE PROVIDER: Kathalene Frames, MD  RESPONSIBLE PARTY:  Acct ID - Guarantor Home Phone Work Phone Relationship Acct Type  [id number] - Devincent,Westley * [phone] Self P/F     [address] [place],  [address]   Palliative Care Follow up Encounter Note    Completed telephone visit with Luis [name] (wife).   I connected with Luis Strzelczyk Spires Jr.'s wife Luis [name] on 11/01/22 by telephone and verified that I am speaking about the correct person using two identifiers.     HISTORY OF PRESENT ILLNESS: 67 y.o. 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 There is also a small extraforaminal disc protrusion on left at L5-S1.     Cognitive: A&O x4              Appetite: 3 meals and snacks daily; eats whatever he wants   Mobility: fall after last visit with injury to Left lower leg   Sleeping Pattern: has hospital bed; sleeps well d/t meds     Respiratory: when pt is SOB will go on Trilogy machine  Pain: takes Dilaudid for pain   Palliative Care/ Hospice: LPN discussed role and purpose of palliative care visit frequency.     CODE STATUS: DNR ADVANCED DIRECTIVES: N MOST FORM: N PPS: 50%   PHYSICAL EXAM:   VITALS:There were no vitals filed for this visit.         Kennia Vanvorst Georgann Housekeeper, LPN

## 2022-11-04 ENCOUNTER — Ambulatory Visit: Payer: Medicare Other | Admitting: Physical Therapy

## 2022-11-11 ENCOUNTER — Ambulatory Visit (INDEPENDENT_AMBULATORY_CARE_PROVIDER_SITE_OTHER): Payer: Medicare Other | Admitting: Neurology

## 2022-11-11 ENCOUNTER — Encounter: Payer: Self-pay | Admitting: Neurology

## 2022-11-11 VITALS — BP 121/74 | HR 57

## 2022-11-11 DIAGNOSIS — G7102 Facioscapulohumeral muscular dystrophy: Secondary | ICD-10-CM

## 2022-11-11 DIAGNOSIS — S32030S Wedge compression fracture of third lumbar vertebra, sequela: Secondary | ICD-10-CM | POA: Diagnosis not present

## 2022-11-11 DIAGNOSIS — G20B1 Parkinson's disease with dyskinesia, without mention of fluctuations: Secondary | ICD-10-CM

## 2022-11-11 NOTE — Progress Notes (Signed)
Chief Complaint  Patient presents with   Follow-up    Rm 13, with wife  Here to discuss Gabapentin 600mg , he is concerned that he is taking to much       [place]. is a 67 y.o. male   Parkinson's disease  Has been followed by Dr. Jannifer Franklin in the past, was taking immediate release Sinemet 25/100 mg tablets, but in September 2020 he developed dizziness, lightheadedness, after dosing of immediate release of Sinemet, has been changed to CR, on slow titrating dose, was changed to Sinemet 50/200 CR 1 tablet 3 times a day since September 2022, seems to tolerated better. Keep Sinemet CR 50/200 mg 1 tablet 3 times a day  Chronic severe neuropathic pain and worsening low back pain following his L3 lumbar compression fracture, kyphoplasty  He is also getting Dilaudid 8 mg 4 times a day from his primary care physician Dr. Inda Merlin, planning on to higher dose 10 mg 4 times a day  Advised him may take Celebrex 200 mg daily as needed,  May keep on current dose of gabapentin 600 mg 3 times a day for baseline, if needed, may take extra, with maximum daily dose of 3600 mg daily   Genetically confirmed FSHD, followed up by Dr. [name] at [facility] clinic Dilated cardiomyopathy with reduced EF (41%),  mild left carpal tunnel syndrome diaphragmatic paralysis on the left,  He was clinically diagnosed with FSHD in 38 (67 yo) at [facility]. Workup included NCV/EMG and muscle biopsy. 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 Is on Cymbalta 30 mg twice a day, has helped his muscle achy pain  DIAGNOSTIC DATA (LABS, IMAGING, TESTING) - I reviewed patient records, labs, notes, testing and imaging myself where available.   MEDICAL HISTORY:  Bravlio Murad Krahn [name]., is a 67 year old male, accompanied by his wife, to follow-up for Parkinson's disease, FSH muscular dystrophy, his primary care physician is Dr. 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 He was clinically diagnosed with FSHD in 89 (67 yo) at [facility]. Workup included NCV/EMG and muscle biopsy. 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  He is followed up by Dr. [name] at [facility] clinic every 6 months  He also had a gradual onset right hand tremor, was diagnosed with Parkinson's disease by Dr. Jannifer Franklin, he was on regular form of Sinemet 25/100 mg in the past, but developed significant dizziness after each dose, has been switched to current Sinemet CR 50/200 mg 3 times daily, taking it at 8 AM, 4 PM, and bedtime  In addition, he suffered a significant joints, diffuse muscle achy pain, is getting Dilaudid 8 mg 4 times a day from his primary care physician Dr. [name]  Patient and his wife strive to keep him active, their daily routine involving frequent walking short distances walker, standing against the wall,  He has began palliative care who comes to their home. Per the patient this is to decrease the number of visits he has to travel to.  He has continued to have significant pain and weakness of his low back and LLE since he underwent  a nerve ablation at L3-4 performed October 2021.   For mobility, he is continuing to use a walker all the time. He also has a PWC at home, which they report they are working with [employer] to continue to adjust the seat for comfort.   He is using Trilogy machine is working well. He continues to have some shortness of breath, especially with activity. He wears his Trilogy every night. Will wear it rarely during the day when taking a nap  or when symptomatic.   UPDATE August 17th 2023: He is accompanied by his wife at today's visit, continues see [facility] clinic by Dr. Tillman Abide every 6 months, Dr. Inda Merlin recently started him on Cymbalta 30 mg bid , it seems to help his pain better  Complains of increased fatigue stiffness since last visit, but there was over all no significant change, he continue to try his best exercise daily, in standing position,  UPDATE November 11 2022: He came in wheelchair, accompanied by his wife at today's visit, he suffered a fall in September, with increased low back pain, leading to hospital admission,  MRI of lumbar May 29, 2022 showed acute compression fracture of L3 with 40% vertebral height loss, compression deformity of L5, status post kyphoplasty, He underwent fluoroscopy guided bilateral transpedicle L3 vertebral body balloon kyphoplasty for osteoporotic fragile fracture,  He had a prolonged rehabilitation, eventually came back home, try to resume his previous activity level, getting fatigued easily, also on polypharmacy for low back pain, planning on taking higher dose of Dilaudid from 8 to 10 mg 4 times a day, already on Cymbalta 30 mg twice a day, Celebrex 200 mg daily as needed, continued Parkinson medicine Sinemet CR 50/201 tablet 3 times a day, also on gabapentin, now is taking 1800 mg daily, was given the prescription up to 3600 mg  With current polypharmacy, he already complains of excessive drowsiness, have to take frequent naps, worry about higher dose of gabapentin may cause increased side effect  PHYSICAL EXAM:   Vitals:   11/11/22 1113  BP: 121/74  Pulse: (!) 57    PHYSICAL EXAMNIATION:  Gen: NAD, conversant, well nourised, well groomed                     Cardiovascular: Regular rate rhythm, no peripheral edema, warm, nontender. Eyes: Conjunctivae clear without exudates or hemorrhage Neck: Supple, no carotid bruits. Pulmonary: Decreased air movement at the left lung  base  NEUROLOGICAL EXAM:  MENTAL STATUS: Speech/cognition: Awake, alert, oriented to history taking and casual conversation, mild slurred speech   CRANIAL NERVES: CN II: Visual fields are full to confrontation. Pupils are round equal and briskly reactive to light. CN III, IV, VI: extraocular movement are normal. No ptosis. CN V: Facial sensation is intact to light touch CN VII:  severe bilateral eye closure, cheek puff weakness, flaccid facial muscle CN VIII: Hearing is normal to causal conversation. CN IX, X: Phonation is slurred CN XI: Head turning and shoulder shrug are intact  MOTOR: Significant atrophy of bilateral proximal arm muscles, especially bilateral deltoid, biceps, barely antigravity movement of bilateral proximal upper extremity muscles, left slightly weaker than the right, fairly normal bilateral handgrip, wrist extension, wrist flexion  Antigravity movement of bilateral lower extremity muscles, bilateral hip flexion, knee extension, knee flexion, trace movement of bilateral ankle dorsiflexion,  REFLEXES: Reflexes are areflexia  SENSORY: Intact to light touch,  COORDINATION: There is no trunk or limb dysmetria noted.  GAIT/STANCE: Deferred  REVIEW  OF SYSTEMS:  Full 14 system review of systems performed and notable only for as above All other review of systems were negative.   ALLERGIES: No Known Allergies  HOME MEDICATIONS: Current Outpatient Medications  Medication Sig Dispense Refill   carbidopa-levodopa (SINEMET CR) 50-200 MG tablet Take 1 tablet by mouth in the morning, at noon, and at bedtime. 270 tablet 4   celecoxib (CELEBREX) 200 MG capsule Take 200 mg by mouth daily as needed.     diazepam (VALIUM) 5 MG tablet Take 2 tablets (10 mg total) by mouth 2 (two) times daily as needed for anxiety. 20 tablet 0   diclofenac Sodium (VOLTAREN) 1 % GEL Apply 2 g topically 4 (four) times daily. 2 g 0   gabapentin (NEURONTIN) 600 MG tablet Take 2 tablets  (1,200 mg total) by mouth 3 (three) times daily. For nerve pain 180 tablet 5   HYDROmorphone (DILAUDID) 8 MG tablet Take 8 mg by mouth in the morning, at noon, in the evening, and at bedtime.     polyethylene glycol (MIRALAX / GLYCOLAX) 17 g packet Take 17 g by mouth at bedtime.     prednisoLONE acetate (PRED FORTE) 1 % ophthalmic suspension Place 1 drop into both eyes every other day.  0   tamsulosin (FLOMAX) 0.4 MG CAPS capsule Take 1 capsule (0.4 mg total) by mouth daily. 30 capsule 0   timolol (BETIMOL) 0.5 % ophthalmic solution Place 1 drop into both eyes 2 (two) times daily.     No current facility-administered medications for this visit.    PAST MEDICAL HISTORY: Past Medical History:  Diagnosis Date   Bilateral foot-drop 10/19/2018   Bursitis, trochanteric    Episodic   Carpal tunnel syndrome on right    Degenerative disk disease    l5-S1   FSH (facioscapulohumeral muscular dystrophy) ([place]) 10/07/2017   Gait abnormality 10/19/2018   GERD (gastroesophageal reflux disease)    Hemorrhoids    with anal fissures   Left lateral epicondylitis    Parkinson's disease 10/19/2018   Skin cancer    right temple area    PAST SURGICAL HISTORY: Past Surgical History:  Procedure Laterality Date   CHOLECYSTECTOMY  2007   HEMORRHOIDECTOMY WITH HEMORRHOID BANDING     IR KYPHO LUMBAR INC FX REDUCE BONE BX UNI/BIL CANNULATION INC/IMAGING  01/10/2020   IR KYPHO LUMBAR INC FX REDUCE BONE BX UNI/BIL CANNULATION INC/IMAGING  05/31/2022    FAMILY HISTORY: Family History  Problem Relation Age of Onset   Allergies Brother    Allergies Sister    Heart disease Father    Brain cancer Mother    Bone cancer Brother     SOCIAL HISTORY: Social History   Socioeconomic History   Marital status: Married    Spouse name: [name]   Number of children: 0   Years of education: Not on file   Highest education level: Not on file  Occupational History   Occupation: Programmer, [employer]  Tobacco Use   Smoking status: Never   Smokeless tobacco: Never  Vaping Use   Vaping Use: Never used  Substance and Sexual Activity   Alcohol use: No   Drug use: No   Sexual activity: Not on file  Other Topics Concern   Not on file  Social History Narrative   Lives with wife   Caffeine use: Sometimes tea   Right handed    Social Determinants of Health   Financial Resource Strain: Not on file  Food  Insecurity: No Food Insecurity (05/27/2022)   Hunger Vital Sign    Worried About Running Out of Food in the Last Year: Never true    Ran Out of Food in the Last Year: Never true  Transportation Needs: No Transportation Needs (05/27/2022)   PRAPARE - Hydrologist (Medical): No    Lack of Transportation (Non-Medical): No  Physical Activity: Not on file  Stress: Not on file  Social Connections: Not on file  Intimate Partner Violence: Not At Risk (05/27/2022)   Humiliation, Afraid, Rape, and Kick questionnaire    Fear of Current or Ex-Partner: No    Emotionally Abused: No    Physically Abused: No    Sexually Abused: No    Marcial Pacas, M.D. Ph.D.  [facility] [address] [place], [place] [address] Ph: [phone] Fax: [phone]  CC:  [name], MD [address] [place],  [place] [address]  Kathalene Frames, MD

## 2022-11-13 ENCOUNTER — Ambulatory Visit: Payer: Medicare Other | Attending: Physician Assistant

## 2022-11-13 DIAGNOSIS — G7102 Facioscapulohumeral muscular dystrophy: Secondary | ICD-10-CM | POA: Diagnosis present

## 2022-11-13 DIAGNOSIS — M6281 Muscle weakness (generalized): Secondary | ICD-10-CM

## 2022-11-13 DIAGNOSIS — R262 Difficulty in walking, not elsewhere classified: Secondary | ICD-10-CM

## 2022-11-13 DIAGNOSIS — R2681 Unsteadiness on feet: Secondary | ICD-10-CM

## 2022-11-13 DIAGNOSIS — R2689 Other abnormalities of gait and mobility: Secondary | ICD-10-CM

## 2022-11-13 NOTE — Therapy (Signed)
OUTPATIENT PHYSICAL TREATMENT   Patient Name: Luis [name]. MRN: [id number] DOB:July 01, 1956, 67 y.o., male Today's Date: 11/13/2022   PCP: Luis Frames, MD REFERRING PROVIDER: Cathlyn Parsons, PA-C    END OF SESSION:  PT End of Session - 11/13/22 1614     Visit Number 7    Number of Visits 16    Date for PT Re-Evaluation 12/25/22    Authorization Type Medicare/medicaid    PT Start Time 1615    PT Stop Time 1700    PT Time Calculation (min) 45 min    Equipment Utilized During Treatment Other (comment)   aquatic weights, pool noodles, large bar bell   Activity Tolerance Patient tolerated treatment well    Behavior During Therapy WFL for tasks assessed/performed             Past Medical History:  Diagnosis Date   Bilateral foot-drop 10/19/2018   Bursitis, trochanteric    Episodic   Carpal tunnel syndrome on right    Degenerative disk disease    l5-S1   FSH (facioscapulohumeral muscular dystrophy) ([place]) 10/07/2017   Gait abnormality 10/19/2018   GERD (gastroesophageal reflux disease)    Hemorrhoids    with anal fissures   Left lateral epicondylitis    Parkinson's disease 10/19/2018   Skin cancer    right temple area   Past Surgical History:  Procedure Laterality Date   CHOLECYSTECTOMY  2007   HEMORRHOIDECTOMY WITH HEMORRHOID BANDING     IR KYPHO LUMBAR INC FX REDUCE BONE BX UNI/BIL CANNULATION INC/IMAGING  01/10/2020   IR KYPHO LUMBAR INC FX REDUCE BONE BX UNI/BIL CANNULATION INC/IMAGING  05/31/2022   Patient Active Problem List   Diagnosis Date Noted   Spinal stenosis of lumbar region with neurogenic claudication 09/18/2022   Low serum albumin    Lumbar compression fracture ([facility]) 06/04/2022   Compression fracture of lumbar vertebra ([facility]) 05/27/2022   Other chronic pain 10/18/2021   Chronic constipation 10/18/2021   Gait abnormality 10/19/2018   Bilateral foot-drop 10/19/2018   Parkinson's disease 10/19/2018   Swelling of lower leg - Right   03/06/2018   Tremor of right hand 12/19/2017   FSH (facioscapulohumeral muscular dystrophy) ([place]) 10/07/2017   Dilated cardiomyopathy ([facility]) 05/02/2017   Upper airway cough syndrome 03/18/2014   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  03/17/2014   Abnormal chest xray 03/16/2014    ONSET DATE: increased issue since recent hx of falling 05/25/22  REFERRING DIAG: G20.A1 (ICD-10-CM) - Parkinson disease G71.00 (ICD-10-CM) - Muscular dystrophy, unspecified S32.000A (ICD-10-CM) - Compression of lumbar vertebra (HCC)  THERAPY DIAG:  Muscle weakness (generalized)  Other abnormalities of gait and mobility  Unsteadiness on feet  Difficulty in walking, not elsewhere classified  FSH (facioscapulohumeral muscular dystrophy) ([place])  Rationale for Evaluation and Treatment: Rehabilitation  SUBJECTIVE:  SUBJECTIVE STATEMENT: Returns to clinic after prolonged absence due to fall and significant skin tear and infection in LLE.   Pt accompanied by: significant other  PERTINENT HISTORY: 67 y.o. male with medical history significant of Parkinson's disease on [place], Chronic severe neuropathic pain, FSHD Muscular dystrophy followed by Dr [name] at [facility] clinic, chronic debility uses walker who was hospitalized with fall from standing after which he has had increase pain in his lumbar region. Dx of L3 compression fx, R middle finger fx, s/p kyphoplasty 05/31/22, pt was d/c from rehab on 06/12/22        PAIN:  Are you having pain? Yes: NPRS scale: 5/10 Pain location: right shoulder, left elbow Pain description: intense  Aggravating factors: staying still Relieving factors: movement  PRECAUTIONS: Fall  WEIGHT BEARING RESTRICTIONS: No  FALLS: Has patient fallen in last 6 months? Yes. Number of falls 2  LIVING  ENVIRONMENT: Lives with: lives with their spouse Lives in: House/apartment Stairs: Has a ramp and a lift.  Has following equipment at home: Gilford Rile - 2 wheeled, Wheelchair (power), and Lift seat, Lift chair, in the process of trying to get a hospital bed.  PLOF: Independent with household mobility with device, Needs assistance with ADLs, Needs assistance with homemaking, and Needs assistance with gait Pt reports he is typically able to use his lift chair and walk to/from bathroom.  Has elevating toilet LIFT.   PATIENT GOALS: continue aquatic therapy, improve standing posture/tolerance  OBJECTIVE:    TODAY'S TREATMENT: 11/13/22 Activity Comments  Transfer training max A for sit-stand   Weight shifting/limb advancement Trials in parallel bars with therapist providing trunk control/support to enable LE mobility  Assisted squats 5x5 Therapist assist/support of trunk to enable LE action  Car transfer max A for sit-stand and CGA-min A for limb advancement   Manual stretching bilat gastroc 4x60 sec to improve plantigrade foot position           PATIENT EDUCATION: Education details: discussion of continued POC details  Person educated: Patient and Spouse Education method: Explanation Education comprehension: verbalized understanding  HOME EXERCISE PROGRAM: Access Code: [place] URL: [email or web address] Date: 08/20/2022 Prepared by: Sherlyn Lees  Exercises - Seated Hamstring Curls with Resistance  - 1 x daily - 7 x weekly - 3 sets - 5-8 reps - Seated Hip Abduction with Resistance  - 1 x daily - 7 x weekly - 3 sets - 10 reps  Manual Muscle Testing: assessed in sitting  Hip flexion: 3+/5 bilat Hip ext: 3-/5 Hip adduction: 2/5 Hip abduction: 4-/5 Knee extension: 4-/5 Knee flexion: 3-/5 Ankle dorsiflexion: 2-/5 Ankle plantarflexion: not assessed by good muscle bulk palpated and able to complete full ROM in sitting  GOALS: Goals reviewed with patient? Yes  SHORT  TERM GOALS: Target date: 11/20/2022    Pt will be independent with initial HEP with spouse's supervision in order to maintain BLE strength to improve functional mobility Baseline: Goal status: IN PROGRESS   2.  Pt will perform sit <> stands with mod A in order to demo improved functional mobility for transfers Baseline: max A Goal status: IN PROGRESS   3.  Pt will perform stand pivot transfer with RW with mod A in order to demo improved functional transfers for home.  Baseline: sit-stand max A, pivoting w/ CGA-min A Goal status: GOAL MET   4.  Pt will perform static standing w/ upright posture x 30 sec intervals with UE support to improve social interaction and environmental scanning Baseline: unable Goal status:  IN PROGRESS  LONG TERM GOALS: Target date: 12/25/2022     Pt will be independent with final HEP with spouse's supervision in order to build upon functional gains made in therapy (which may include aquatic intervention) Baseline:  Goal status: IN PROGRESS   2.   Pt will perform sit <> stands with min A in order to demo improved functional mobility for transfers and decreased caregiver burden. Baseline: Max A Goal status: IN PROGRESS   3.  Pt will ambulate at least 100' with RW and supervision/min guard in order to demo improved household mobility.  Baseline: 53 ft CGA Goal status: REVISED   4.  Pt will perform bed mobility with min guard in order to improve functional mobility/decr caregiver burden.  Baseline:  Goal status: IN PROGRESS   ASSESSMENT:  CLINICAL IMPRESSION: Continued with activities to promote strength, standing balance/tolerance to improve safety with functional mobility. Therapist providing offloading/support for upper body/trunk to enable and facilitate LE movement and control with assisted mini-squats at low volume to prevent overworking muscular tissue impacted by MD in order to improve safety with transfers.  Patient would benefit from continued  skilled maintenance services to prevent further decline and sequelae of chronic/progressive diseases/illness.  OBJECTIVE IMPAIRMENTS: Abnormal gait, decreased activity tolerance, decreased balance, decreased coordination, decreased endurance, decreased mobility, difficulty walking, decreased strength, impaired flexibility, impaired tone, postural dysfunction, and pain.   ACTIVITY LIMITATIONS: lifting, sitting, standing, squatting, transfers, bed mobility, bathing, toileting, dressing, reach over head, and locomotion level  PARTICIPATION LIMITATIONS:  self-care and reduced ability to ambulate household distances per PLOF  PERSONAL FACTORS: Past/current experiences, Time since onset of injury/illness/exacerbation, and 3+ comorbidities: PD, MD, lumbar fx  are also affecting patient's functional outcome.   REHAB POTENTIAL: Good  CLINICAL DECISION MAKING: Evolving/moderate complexity  EVALUATION COMPLEXITY: Moderate  PLAN:  PT FREQUENCY: 1x/week  PT DURATION: 10 weeks  PLANNED INTERVENTIONS: Therapeutic exercises, Therapeutic activity, Neuromuscular re-education, Balance training, Gait training, Patient/Family education, Self Care, Joint mobilization, Stair training, Vestibular training, Canalith repositioning, Orthotic/Fit training, DME instructions, Aquatic Therapy, Dry Needling, Electrical stimulation, Wheelchair mobility training, Cryotherapy, Moist heat, Taping, and Manual therapy  PLAN FOR NEXT SESSION: cont. aquatic therapy, upright standing posture/techniques, standing frame?   4:14 PM, 11/13/22 M. Sherlyn Lees, PT, DPT Physical Therapist- [place] Office Number: [phone]

## 2022-11-18 ENCOUNTER — Ambulatory Visit: Payer: Medicare Other | Admitting: Physical Therapy

## 2022-11-18 ENCOUNTER — Telehealth: Payer: Self-pay | Admitting: Neurology

## 2022-11-18 NOTE — Telephone Encounter (Signed)
Pt's wife is asking for a call to clarify things re: pt's gabapentin (NEURONTIN) 600 MG tablet  And carbidopa-levodopa (SINEMET CR) 50-200 MG tablet being sent to  [phone]

## 2022-11-19 MED ORDER — GABAPENTIN 600 MG PO TABS: 600.0000 mg | ORAL_TABLET | Freq: Four times a day (QID) | ORAL | 4 refills | Status: DC

## 2022-11-19 MED ORDER — CARBIDOPA-LEVODOPA ER 50-200 MG PO TBCR: 1.0000 | EXTENDED_RELEASE_TABLET | Freq: Three times a day (TID) | ORAL | 4 refills | Status: DC

## 2022-11-19 NOTE — Telephone Encounter (Signed)
I called patient's wife, Katharine Look, per DPR.  I had an extended conversation with her lasting greater than 15 minutes.  Patient will be increasing his Dilaudid to 10 mg 4 times daily this week.  Patient's wife feels that the gabapentin 1200 mg 3 times daily is too much.  She would like him to go back to Dr. Rhea Belton prescription of gabapentin 600 mg 4 times daily.  She will not be picking up the prescription of gabapentin from Dr. Rhys Martini office.  The refills for sinemet and gabapentin have been sent to [facility].

## 2022-11-19 NOTE — Addendum Note (Signed)
Addended by: Lester Inverness A on: 11/19/2022 12:13 PM   Modules accepted: Orders

## 2022-11-20 ENCOUNTER — Encounter: Payer: Medicare Other | Admitting: Physical Medicine and Rehabilitation

## 2022-11-21 ENCOUNTER — Other Ambulatory Visit: Payer: Medicare Other

## 2022-11-21 VITALS — BP 108/68 | HR 60 | Temp 98.3°F

## 2022-11-21 DIAGNOSIS — Z515 Encounter for palliative care: Secondary | ICD-10-CM

## 2022-11-21 NOTE — Progress Notes (Signed)
PATIENT NAME: Luis E Terrien Jr. DOB: February 22, 1956 MRN: [id number]  PRIMARY CARE PROVIDER: Kathalene Frames, MD  RESPONSIBLE PARTY:  Acct ID - Guarantor Home Phone Work Phone Relationship Acct Type  [id number] - Martino,Loukas * [phone] Self P/F     [address], [name], [place] [address]     Palliative Care Follow up Encounter Note   Completed home visit. Wife also present.     HISTORY OF PRESENT ILLNESS:    Respiratory: when SOB, if too much will go on Trilogy machine; no SOB at this time   Cardiac: edema to BLE ankles  Cognitive: alert and oriented x4   Appetite: continues to have 3 meals and snacks  GI/GU: daily BM; no urinary issues at this time   Mobility: denies fall since last encounter; ambulates with a walker; gait unsteady but pt takes his time and is intentionally careful with each step   Sleeping Pattern: still sleeps well throughout the night due to meds  Pain: new in the last 2 weeks thigh pain has been an issue   Goals of Care: To stay in the home with his wife  CODE STATUS: DNR ADVANCED DIRECTIVES: N MOST FORM: N PPS: 50%   PHYSICAL EXAM:   VITALS: Today's Vitals   11/21/22 1006  BP: 108/68  Pulse: 60  Temp: 98.3 F (36.8 C)  TempSrc: Temporal  SpO2: 97%  PainSc: 10-Worst pain ever  PainLoc: Generalized    LUNGS: clear to auscultation , decreased breath sounds CARDIAC: Cor RRR EXTREMITIES: atrophy; limited ROM; LLE has 1 scabbed area and a half scabbed/half open area with pale yellow slough in the wound bed; redness noted around the two areas SKIN: dry, pink, warm  NEURO: negative       Ludwig Tugwell Georgann Housekeeper, LPN

## 2022-11-27 ENCOUNTER — Ambulatory Visit: Payer: Medicare Other

## 2022-11-27 DIAGNOSIS — R262 Difficulty in walking, not elsewhere classified: Secondary | ICD-10-CM

## 2022-11-27 DIAGNOSIS — M6281 Muscle weakness (generalized): Secondary | ICD-10-CM | POA: Diagnosis not present

## 2022-11-27 DIAGNOSIS — R2681 Unsteadiness on feet: Secondary | ICD-10-CM

## 2022-11-27 DIAGNOSIS — G7102 Facioscapulohumeral muscular dystrophy: Secondary | ICD-10-CM

## 2022-11-27 DIAGNOSIS — R2689 Other abnormalities of gait and mobility: Secondary | ICD-10-CM

## 2022-11-27 NOTE — Therapy (Signed)
OUTPATIENT PHYSICAL TREATMENT   Patient Name: Luis [name] Moga [name]. MRN: [id number] DOB:[birth year], 67 y.o., male Today's Date: 11/27/2022   PCP: Kathalene Frames, MD REFERRING PROVIDER: Cathlyn Parsons, PA-C    END OF SESSION:  PT End of Session - 11/27/22 1606     Visit Number 8    Number of Visits 16    Date for PT Re-Evaluation 12/25/22    Authorization Type Medicare/medicaid    PT Start Time 1615    PT Stop Time 1700    PT Time Calculation (min) 45 min    Equipment Utilized During Treatment Other (comment)   aquatic weights, pool noodles, large bar bell   Activity Tolerance Patient tolerated treatment well    Behavior During Therapy WFL for tasks assessed/performed             Past Medical History:  Diagnosis Date   Bilateral foot-drop 10/19/2018   Bursitis, trochanteric    Episodic   Carpal tunnel syndrome on right    Degenerative disk disease    l5-S1   FSH (facioscapulohumeral muscular dystrophy) ([place]) 10/07/2017   Gait abnormality 10/19/2018   GERD (gastroesophageal reflux disease)    Hemorrhoids    with anal fissures   Left lateral epicondylitis    Parkinson's disease 10/19/2018   Skin cancer    right temple area   Past Surgical History:  Procedure Laterality Date   CHOLECYSTECTOMY  2007   HEMORRHOIDECTOMY WITH HEMORRHOID BANDING     IR KYPHO LUMBAR INC FX REDUCE BONE BX UNI/BIL CANNULATION INC/IMAGING  01/10/2020   IR KYPHO LUMBAR INC FX REDUCE BONE BX UNI/BIL CANNULATION INC/IMAGING  05/31/2022   Patient Active Problem List   Diagnosis Date Noted   Spinal stenosis of lumbar region with neurogenic claudication 09/18/2022   Low serum albumin    Lumbar compression fracture ([place]) 06/04/2022   Compression fracture of lumbar vertebra ([place]) 05/27/2022   Other chronic pain 10/18/2021   Chronic constipation 10/18/2021   Gait abnormality 10/19/2018   Bilateral foot-drop 10/19/2018   Parkinson's disease 10/19/2018   Swelling of lower leg - Right   03/06/2018   Tremor of right hand 12/19/2017   FSH (facioscapulohumeral muscular dystrophy) ([place]) 10/07/2017   Dilated cardiomyopathy ([place]) 05/02/2017   Upper airway cough syndrome 03/18/2014   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  03/17/2014   Abnormal chest xray 03/16/2014    ONSET DATE: increased issue since recent hx of falling 05/25/22  REFERRING DIAG: G20.A1 (ICD-10-CM) - Parkinson disease G71.00 (ICD-10-CM) - Muscular dystrophy, unspecified S32.000A (ICD-10-CM) - Compression of lumbar vertebra (HCC)  THERAPY DIAG:  Muscle weakness (generalized)  Other abnormalities of gait and mobility  Unsteadiness on feet  Difficulty in walking, not elsewhere classified  FSH (facioscapulohumeral muscular dystrophy) ([place])  Rationale for Evaluation and Treatment: Rehabilitation  SUBJECTIVE:  SUBJECTIVE STATEMENT: LLE wound is still healing slowly, still an open wound.   Pt accompanied by: significant other  PERTINENT HISTORY: 67 y.o. male with medical history significant of Parkinson's disease on S[place], Chronic severe neuropathic pain, FSHD Muscular dystrophy followed by Dr [name] at [facility], chronic debility uses walker who was hospitalized with fall from standing after which he has had increase pain in his lumbar region. Dx of L3 compression fx, R middle finger fx, s/p kyphoplasty 05/31/22, pt was d/c from rehab on 06/12/22        PAIN:  Are you having pain? Yes: NPRS scale: 5/10 Pain location: right shoulder, left elbow Pain description: intense  Aggravating factors: staying still Relieving factors: movement  PRECAUTIONS: Fall  WEIGHT BEARING RESTRICTIONS: No  FALLS: Has patient fallen in last 6 months? Yes. Number of falls 2  LIVING ENVIRONMENT: Lives with: lives with their spouse Lives in:  House/apartment Stairs: Has a ramp and a lift.  Has following equipment at home: Gilford Rile - 2 wheeled, Wheelchair (power), and Lift seat, Lift chair, in the process of trying to get a hospital bed.  PLOF: Independent with household mobility with device, Needs assistance with ADLs, Needs assistance with homemaking, and Needs assistance with gait Pt reports he is typically able to use his lift chair and walk to/from bathroom.  Has elevating toilet LIFT.   PATIENT GOALS: continue aquatic therapy, improve standing posture/tolerance  OBJECTIVE:    TODAY'S TREATMENT: 11/27/22 Activity Comments  Seated hamstring curls 2x10 10#  Slideboard transfer Total assist  Education in total assist mobility aids -sit to stand lifts -hoyer lifts  Standing frame -for upright, weightbearing with trials in trunk extension  Car transfer max A for sit-stand              PATIENT EDUCATION: Education details: discussion of continued POC details  Person educated: Patient and Spouse Education method: Explanation Education comprehension: verbalized understanding  HOME EXERCISE PROGRAM: Access Code: Nunez URL: [email or web address] Date: 08/20/2022 Prepared by: Sherlyn Lees  Exercises - Seated Hamstring Curls with Resistance  - 1 x daily - 7 x weekly - 3 sets - 5-8 reps - Seated Hip Abduction with Resistance  - 1 x daily - 7 x weekly - 3 sets - 10 reps  Manual Muscle Testing: assessed in sitting  Hip flexion: 3+/5 bilat Hip ext: 3-/5 Hip adduction: 2/5 Hip abduction: 4-/5 Knee extension: 4-/5 Knee flexion: 3-/5 Ankle dorsiflexion: 2-/5 Ankle plantarflexion: not assessed by good muscle bulk palpated and able to complete full ROM in sitting  GOALS: Goals reviewed with patient? Yes  SHORT TERM GOALS: Target date: 11/20/2022    Pt will be independent with initial HEP with spouse's supervision in order to maintain BLE strength to improve functional mobility Baseline: Goal status:  IN PROGRESS   2.  Pt will perform sit <> stands with mod A in order to demo improved functional mobility for transfers Baseline: max A Goal status: IN PROGRESS   3.  Pt will perform stand pivot transfer with RW with mod A in order to demo improved functional transfers for home.  Baseline: sit-stand max A, pivoting w/ CGA-min A Goal status: GOAL MET   4.  Pt will perform static standing w/ upright posture x 30 sec intervals with UE support to improve social interaction and environmental scanning Baseline: unable Goal status: IN PROGRESS  LONG TERM GOALS: Target date: 12/25/2022     Pt will be independent with final HEP with spouse's supervision in order  to build upon functional gains made in therapy (which may include aquatic intervention) Baseline:  Goal status: IN PROGRESS   2.   Pt will perform sit <> stands with min A in order to demo improved functional mobility for transfers and decreased caregiver burden. Baseline: Max A Goal status: IN PROGRESS   3.  Pt will ambulate at least 100' with RW and supervision/min guard in order to demo improved household mobility.  Baseline: 53 ft CGA Goal status: REVISED   4.  Pt will perform bed mobility with min guard in order to improve functional mobility/decr caregiver burden.  Baseline:  Goal status: IN PROGRESS   ASSESSMENT:  CLINICAL IMPRESSION: Continued with activities to trial functional mobility to generalize to caregiver training. Trial of slide board transfers from w/c to EOM requiring total assist and difficulty in maintaining BOS with feet and unable to provide functional push-off with UE. Demonstration of various mobility devices for caregiver use such as Clarise Cruz (sit to stand) lift and hoyer lift and rationale for each device. Tolerated standing frame activity quite well with ability to dial in fit to accommodate LE contractures and facilitate trunk extension to allow for upright posture for social interaction and opportunity for  weightbearing. Continued services to maintain functional status  OBJECTIVE IMPAIRMENTS: Abnormal gait, decreased activity tolerance, decreased balance, decreased coordination, decreased endurance, decreased mobility, difficulty walking, decreased strength, impaired flexibility, impaired tone, postural dysfunction, and pain.   ACTIVITY LIMITATIONS: lifting, sitting, standing, squatting, transfers, bed mobility, bathing, toileting, dressing, reach over head, and locomotion level  PARTICIPATION LIMITATIONS:  self-care and reduced ability to ambulate household distances per PLOF  PERSONAL FACTORS: Past/current experiences, Time since onset of injury/illness/exacerbation, and 3+ comorbidities: PD, MD, lumbar fx  are also affecting patient's functional outcome.   REHAB POTENTIAL: Good  CLINICAL DECISION MAKING: Evolving/moderate complexity  EVALUATION COMPLEXITY: Moderate  PLAN:  PT FREQUENCY: 1x/week  PT DURATION: 10 weeks  PLANNED INTERVENTIONS: Therapeutic exercises, Therapeutic activity, Neuromuscular re-education, Balance training, Gait training, Patient/Family education, Self Care, Joint mobilization, Stair training, Vestibular training, Canalith repositioning, Orthotic/Fit training, DME instructions, Aquatic Therapy, Dry Needling, Electrical stimulation, Wheelchair mobility training, Cryotherapy, Moist heat, Taping, and Manual therapy  PLAN FOR NEXT SESSION: cont. aquatic therapy, upright standing posture/techniques, standing frame?   4:18 PM, 11/27/22 M. Sherlyn Lees, PT, DPT Physical Therapist- [place] Office Number: [phone]

## 2022-12-02 ENCOUNTER — Ambulatory Visit: Payer: Medicare Other | Admitting: Physical Therapy

## 2022-12-09 ENCOUNTER — Ambulatory Visit: Payer: Medicare Other | Attending: Physician Assistant

## 2022-12-09 DIAGNOSIS — R29818 Other symptoms and signs involving the nervous system: Secondary | ICD-10-CM | POA: Insufficient documentation

## 2022-12-09 DIAGNOSIS — R2689 Other abnormalities of gait and mobility: Secondary | ICD-10-CM | POA: Insufficient documentation

## 2022-12-09 DIAGNOSIS — R2681 Unsteadiness on feet: Secondary | ICD-10-CM | POA: Insufficient documentation

## 2022-12-09 DIAGNOSIS — M6281 Muscle weakness (generalized): Secondary | ICD-10-CM | POA: Insufficient documentation

## 2022-12-09 DIAGNOSIS — R262 Difficulty in walking, not elsewhere classified: Secondary | ICD-10-CM | POA: Diagnosis present

## 2022-12-09 NOTE — Therapy (Signed)
OUTPATIENT PHYSICAL TREATMENT   Patient Name: Luis [name]. MRN: [id number] DOB:[birth year], 67 y.o., male Today's Date: 11/27/2022   PCP: Kathalene Frames, MD REFERRING PROVIDER: Cathlyn Parsons, PA-C    END OF SESSION:  PT End of Session - 11/27/22 1606     Visit Number 8    Number of Visits 16    Date for PT Re-Evaluation 12/25/22    Authorization Type Medicare/medicaid    PT Start Time 1615    PT Stop Time 1700    PT Time Calculation (min) 45 min    Equipment Utilized During Treatment Other (comment)   aquatic weights, pool noodles, large bar bell   Activity Tolerance Patient tolerated treatment well    Behavior During Therapy WFL for tasks assessed/performed             Past Medical History:  Diagnosis Date   Bilateral foot-drop 10/19/2018   Bursitis, trochanteric    Episodic   Carpal tunnel syndrome on right    Degenerative disk disease    l5-S1   FSH (facioscapulohumeral muscular dystrophy) ([place]) 10/07/2017   Gait abnormality 10/19/2018   GERD (gastroesophageal reflux disease)    Hemorrhoids    with anal fissures   Left lateral epicondylitis    Parkinson's disease 10/19/2018   Skin cancer    right temple area   Past Surgical History:  Procedure Laterality Date   CHOLECYSTECTOMY  2007   HEMORRHOIDECTOMY WITH HEMORRHOID BANDING     IR KYPHO LUMBAR INC FX REDUCE BONE BX UNI/BIL CANNULATION INC/IMAGING  01/10/2020   IR KYPHO LUMBAR INC FX REDUCE BONE BX UNI/BIL CANNULATION INC/IMAGING  05/31/2022   Patient Active Problem List   Diagnosis Date Noted   Spinal stenosis of lumbar region with neurogenic claudication 09/18/2022   Low serum albumin    Lumbar compression fracture ([place]) 06/04/2022   Compression fracture of lumbar vertebra ([place]) 05/27/2022   Other chronic pain 10/18/2021   Chronic constipation 10/18/2021   Gait abnormality 10/19/2018   Bilateral foot-drop 10/19/2018   Parkinson's disease 10/19/2018   Swelling of lower leg - Right   03/06/2018   Tremor of right hand 12/19/2017   FSH (facioscapulohumeral muscular dystrophy) ([place]) 10/07/2017   Dilated cardiomyopathy ([place]) 05/02/2017   Upper airway cough syndrome 03/18/2014   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  03/17/2014   Abnormal chest xray 03/16/2014    ONSET DATE: increased issue since recent hx of falling 05/25/22  REFERRING DIAG: G20.A1 (ICD-10-CM) - Parkinson disease G71.00 (ICD-10-CM) - Muscular dystrophy, unspecified S32.000A (ICD-10-CM) - Compression of lumbar vertebra (HCC)  THERAPY DIAG:  Muscle weakness (generalized)  Other abnormalities of gait and mobility  Unsteadiness on feet  Difficulty in walking, not elsewhere classified  FSH (facioscapulohumeral muscular dystrophy) ([place])  Rationale for Evaluation and Treatment: Rehabilitation  SUBJECTIVE:  SUBJECTIVE STATEMENT: LLE wound is still healing slowly, now scabbed over.  Reports increased LBP and right shoulder pain  Pt accompanied by: significant other  PERTINENT HISTORY: 67 y.o. male with medical history significant of Parkinson's disease on [place], Chronic severe neuropathic pain, FSHD Muscular dystrophy followed by Dr Baron Sane at [facility] clinic, chronic debility uses walker who was hospitalized with fall from standing after which he has had increase pain in his lumbar region. Dx of L3 compression fx, R middle finger fx, s/p kyphoplasty 05/31/22, pt was d/c from rehab on 06/12/22        PAIN:  Are you having pain? Yes: NPRS scale: 11/10 Pain location: right shoulder, low back Pain description: intense  Aggravating factors: staying still Relieving factors: movement  PRECAUTIONS: Fall  WEIGHT BEARING RESTRICTIONS: No  FALLS: Has patient fallen in last 6 months? Yes. Number of falls 2  LIVING ENVIRONMENT: Lives  with: lives with their spouse Lives in: House/apartment Stairs: Has a ramp and a lift.  Has following equipment at home: Dan Humphreys - 2 wheeled, Wheelchair (power), and Lift seat, Lift chair, in the process of trying to get a hospital bed.  PLOF: Independent with household mobility with device, Needs assistance with ADLs, Needs assistance with homemaking, and Needs assistance with gait Pt reports he is typically able to use his lift chair and walk to/from bathroom.  Has elevating toilet LIFT.   PATIENT GOALS: continue aquatic therapy, improve standing posture/tolerance  OBJECTIVE:   TODAY'S TREATMENT: 12/09/22 Activity Comments  Transfer training -techniques in sit to stand from various seat heights requiring max A due to degree of LE weakness for sit-stand. Once standing able to pivot with SBA-CGA using elevated RW.   Stretching/ROM -passive SKTC 4x60 sec -passive DKTC 4x60 sec -SLR/hamstring stretch: 4x60 sec w/ and without ankle DF for nerve glide -supine hip abd AAROM 3x10 reps  Joint mobilizations Right shoulder grade 2 distractions to reduce pain                 PATIENT EDUCATION: Education details: discussion of continued POC details  Person educated: Patient and Spouse Education method: Explanation Education comprehension: verbalized understanding  HOME EXERCISE PROGRAM: Access Code: [email or web address] URL: [email or web address] Date: 08/20/2022 Prepared by: Shary Decamp  Exercises - Seated Hamstring Curls with Resistance  - 1 x daily - 7 x weekly - 3 sets - 5-8 reps - Seated Hip Abduction with Resistance  - 1 x daily - 7 x weekly - 3 sets - 10 reps  Manual Muscle Testing: assessed in sitting  Hip flexion: 3+/5 bilat Hip ext: 3-/5 Hip adduction: 2/5 Hip abduction: 4-/5 Knee extension: 4-/5 Knee flexion: 3-/5 Ankle dorsiflexion: 2-/5 Ankle plantarflexion: not assessed by good muscle bulk palpated and able to complete full ROM in sitting  GOALS: Goals reviewed  with patient? Yes  SHORT TERM GOALS: Target date: 11/20/2022    Pt will be independent with initial HEP with spouse's supervision in order to maintain BLE strength to improve functional mobility Baseline: Goal status: IN PROGRESS   2.  Pt will perform sit <> stands with mod A in order to demo improved functional mobility for transfers Baseline: max A Goal status: IN PROGRESS   3.  Pt will perform stand pivot transfer with RW with mod A in order to demo improved functional transfers for home.  Baseline: sit-stand max A, pivoting w/ CGA-min A Goal status: GOAL MET   4.  Pt will perform static standing w/ upright posture x 30 sec intervals  with UE support to improve social interaction and environmental scanning Baseline: unable Goal status: IN PROGRESS  LONG TERM GOALS: Target date: 12/25/2022     Pt will be independent with final HEP with spouse's supervision in order to build upon functional gains made in therapy (which may include aquatic intervention) Baseline:  Goal status: IN PROGRESS   2.   Pt will perform sit <> stands with min A in order to demo improved functional mobility for transfers and decreased caregiver burden. Baseline: Max A Goal status: IN PROGRESS   3.  Pt will ambulate at least 100' with RW and supervision/min guard in order to demo improved household mobility.  Baseline: 53 ft CGA Goal status: REVISED   4.  Pt will perform bed mobility with min guard in order to improve functional mobility/decr caregiver burden.  Baseline:  Goal status: IN PROGRESS   ASSESSMENT:  CLINICAL IMPRESSION: Pt reporting increased back and right shoulder pain. Treatment focus on flexion biased stretching for posterior chain and low back to help alleviate back pain with good response. Stretching to reduce LE contracture w/ significant plantarflexion contractures present leading to limited plantigrade and requiring excessive compensatory postures to accommodate.  Stretching  performed in passive manner due to patient's UE deficits and limited use requiring therapist to position, perform, and execute to maintain therapeutic stretch.  Max A needed for sit to stand due to LE weakness but once standing able to pivot with RW and SBA-CGA. Continued sessions indicated to prevent sequelae of conditions.   OBJECTIVE IMPAIRMENTS: Abnormal gait, decreased activity tolerance, decreased balance, decreased coordination, decreased endurance, decreased mobility, difficulty walking, decreased strength, impaired flexibility, impaired tone, postural dysfunction, and pain.   ACTIVITY LIMITATIONS: lifting, sitting, standing, squatting, transfers, bed mobility, bathing, toileting, dressing, reach over head, and locomotion level  PARTICIPATION LIMITATIONS:  self-care and reduced ability to ambulate household distances per PLOF  PERSONAL FACTORS: Past/current experiences, Time since onset of injury/illness/exacerbation, and 3+ comorbidities: PD, MD, lumbar fx  are also affecting patient's functional outcome.   REHAB POTENTIAL: Good  CLINICAL DECISION MAKING: Evolving/moderate complexity  EVALUATION COMPLEXITY: Moderate  PLAN:  PT FREQUENCY: 1x/week  PT DURATION: 10 weeks  PLANNED INTERVENTIONS: Therapeutic exercises, Therapeutic activity, Neuromuscular re-education, Balance training, Gait training, Patient/Family education, Self Care, Joint mobilization, Stair training, Vestibular training, Canalith repositioning, Orthotic/Fit training, DME instructions, Aquatic Therapy, Dry Needling, Electrical stimulation, Wheelchair mobility training, Cryotherapy, Moist heat, Taping, and Manual therapy  PLAN FOR NEXT SESSION: cont. aquatic therapy, upright standing posture/techniques, standing frame?   4:18 PM, 11/27/22 M. Shary DecampKelly Tinzlee Craker, PT, DPT Physical Therapist- [place] Office Number: [phone]

## 2022-12-16 ENCOUNTER — Ambulatory Visit: Payer: Medicare Other | Admitting: Physical Therapy

## 2022-12-18 ENCOUNTER — Encounter: Payer: Medicare Other | Attending: Physical Medicine & Rehabilitation | Admitting: Physical Medicine and Rehabilitation

## 2022-12-18 ENCOUNTER — Encounter: Payer: Self-pay | Admitting: Physical Medicine and Rehabilitation

## 2022-12-18 VITALS — BP 122/77 | HR 61 | Ht 71.0 in

## 2022-12-18 DIAGNOSIS — M7521 Bicipital tendinitis, right shoulder: Secondary | ICD-10-CM | POA: Diagnosis not present

## 2022-12-18 DIAGNOSIS — M7918 Myalgia, other site: Secondary | ICD-10-CM | POA: Diagnosis present

## 2022-12-18 DIAGNOSIS — G7102 Facioscapulohumeral muscular dystrophy: Secondary | ICD-10-CM | POA: Diagnosis not present

## 2022-12-18 DIAGNOSIS — G20A1 Parkinson's disease without dyskinesia, without mention of fluctuations: Secondary | ICD-10-CM

## 2022-12-18 DIAGNOSIS — G8929 Other chronic pain: Secondary | ICD-10-CM | POA: Insufficient documentation

## 2022-12-18 DIAGNOSIS — M48062 Spinal stenosis, lumbar region with neurogenic claudication: Secondary | ICD-10-CM

## 2022-12-18 MED ORDER — LIDOCAINE HCL 1 % IJ SOLN
1.0000 mL | Freq: Once | INTRAMUSCULAR | Status: AC
  Administered 2022-12-18: 1 mL

## 2022-12-18 MED ORDER — BETAMETHASONE SOD PHOS & ACET 6 (3-3) MG/ML IJ SUSP
6.0000 mg | Freq: Once | INTRAMUSCULAR | Status: AC
  Administered 2022-12-18: 6 mg via INTRA_ARTICULAR

## 2022-12-18 MED ORDER — DULOXETINE HCL 60 MG PO CPEP: 60.0000 mg | ORAL_CAPSULE | Freq: Every day | ORAL | 1 refills | Status: DC

## 2022-12-18 MED ORDER — LIDOCAINE HCL 1 % IJ SOLN
7.0000 mL | Freq: Once | INTRAMUSCULAR | Status: AC
  Administered 2022-12-18: 6 mL

## 2022-12-18 NOTE — Progress Notes (Signed)
Pt is a 67 yr old male with hx of FSH muscular dystrophy- dx'd in 1992- - also has Parkinson's disease, dilated cardiomyopathy- EF 41%; compression fx s/p fall s/p kyphoplasty at L5; also has disc protrusion Left L5/S1 and L disc protrusion at Left L1-2.   Here for f/u on his FSH muscular dystrophy.  And trigger point injections.   Still has a lot of pain in shoulders.   Pain was 50% better after shots last time.  Had to cancel last appointment- last month- but was sick so had to cancel.  Pain improvement 50-60% better and lasted ~ 1 month.  Then came back with vengeance.  Also shoulder injection lasted ~ 1 month   Pain limits him walking with RW- did  R biceps tendonitis injection.    Has appointment end of May for Dr Cherre Huger to see if there's anything that can be done.   Has fallen since last seen him- still healing- L shin scabs- x2- think they cancelled due to fall. Fell ~ 5-6 weeks ago.   As a result, cannot aquatic therapy yet due to open wounds.  Put on Oral ABX for wounds- is now scabbed over. But not healed yet.   FSH- pain- up to 10 mg Dilaudid- taking up to 4x/day- q6 hours.  Gabapentin-  600 mg QID Cymbalta- 30 mg daily-   Also noticed Parkinson's disease has become more obvious-  more weakness, tremors and rigidity.   Never had to get prior auth that she's aware of before for [employer].   Sees Dr Charlsie Merles for feet- toes curling up due to muscle atrophy.     Exam: Awake, alert, appropriate, R>L tremor notable; accompanied by wife, NAD Masked facies- more notable.  Tight musculature with a lot of trigger points,  since so much muscle atrophy in suprapspinatus, infraspinatus; rhomboids, teres, etc. Due to [facility] MD.  Rigidity noted due to PD.      Plan: Would benefit from Celestone injection for R shoulder pain- will see if can get approved.    2. Can increase Cymbalta to 60 mg daily- not on anything that should interfere and no renal issues.  Will send in 90 day  suply with 1 refill.    3. DNR- plans on withholding food/water when he cannot walk anymore-    4. Patient here for trigger point injections for  Consent done and on chart.  Cleaned areas with alcohol and injected using a 27 gauge 1.5 inch needle  Injected 6cc- none wasted Using 1% Lidocaine with no EPI  Upper traps B/L x3 Levators- B/L  Posterior scalenes B/L and post auricular B/L  Middle scalenes B/L x2 Splenius Capitus B/L  Pectoralis Major- B/L - hit blood vessel on R- tiny bruise Rhomboids- B/L  Infraspinatus Teres Major/minor Thoracic paraspinals Lumbar paraspinals Other injections-    Patient's level of pain prior was 10/10 Current level of pain after injections is 7/10  There was no bleeding or complications.  Patient was advised to drink a lot of water on day after injections to flush system Will have increased soreness for 12-48 hours after injections.  Can use Lidocaine patches the day AFTER injections Can use theracane on day of injections in places didn't inject Can use heating pad 4-6 hours AFTER injections  5. Can take his Celebrex- doesn't usually cause ulcers- take with food.   6. Discussed end of life issues- and plan.   7. steroid injection was performed at [facility] with celestone  using  1% plain Lidocaine and 1cc Celestone. This was well tolerated.  Cleaned with betadine x3 and allowed to dry- then alcohol then injected using 27 gauge 1.5 inch needle- no bleeding or complications.    F/U in 3 months for steroid injections of  R biceps tendonitis  Lidocaine will kick in 15 minutes- and wear off tonight- the steroid will kick in tomorrow within 24 hours and take up to 72 hours to fully kick in.  8. Got hospital bed.    9. F/U in 6 weeks- alternate trigger point injections/single appointments and 3 months for Celestone of shoulder/double appt- FSH   I spent a total of  46  minutes on total care today- >50% coordination of care- due  to 5 minutes on Celestone and trigger point injections 6 minutes and 32 minutes on f/u- discussing grief and end of life issues.

## 2022-12-18 NOTE — Patient Instructions (Signed)
Plan: Would benefit from Celestone injection for R shoulder pain- will see if can get approved.    2. Can increase Cymbalta to 60 mg daily- not on anything that should interfere and no renal issues.  Will send in 90 day suply with 1 refill.    3. DNR- plans on withholding food/water when he cannot walk anymore-    4. Patient here for trigger point injections for  Consent done and on chart.  Cleaned areas with alcohol and injected using a 27 gauge 1.5 inch needle  Injected 6cc- none wasted Using 1% Lidocaine with no EPI  Upper traps B/L x3 Levators- B/L  Posterior scalenes B/L and post auricular B/L  Middle scalenes B/L x2 Splenius Capitus B/L  Pectoralis Major- B/L - hit blood vessel on R- tiny bruise Rhomboids- B/L  Infraspinatus Teres Major/minor Thoracic paraspinals Lumbar paraspinals Other injections-    Patient's level of pain prior was 10/10 Current level of pain after injections is 7/10  There was no bleeding or complications.  Patient was advised to drink a lot of water on day after injections to flush system Will have increased soreness for 12-48 hours after injections.  Can use Lidocaine patches the day AFTER injections Can use theracane on day of injections in places didn't inject Can use heating pad 4-6 hours AFTER injections  5. Can take his Celebrex- doesn't usually cause ulcers- take with food.   6. Discussed end of life issues- and plan.   7. steroid injection was performed at [facility] with celestone  using 1% plain Lidocaine and 1cc Celestone. This was well tolerated.  Cleaned with betadine x3 and allowed to dry- then alcohol then injected using 27 gauge 1.5 inch needle- no bleeding or complications.    F/U in 3 months for steroid injections of  R biceps tendonitis  Lidocaine will kick in 15 minutes- and wear off tonight- the steroid will kick in tomorrow within 24 hours and take up to 72 hours to fully kick in.  8. Got hospital bed.     9. F/U in 6 weeks- alternate trigger point injections/single appointments and 3 months for Celestone of shoulder/double appt- [facility]

## 2022-12-23 ENCOUNTER — Ambulatory Visit: Payer: Medicare Other

## 2022-12-23 DIAGNOSIS — R2681 Unsteadiness on feet: Secondary | ICD-10-CM

## 2022-12-23 DIAGNOSIS — R262 Difficulty in walking, not elsewhere classified: Secondary | ICD-10-CM

## 2022-12-23 DIAGNOSIS — M6281 Muscle weakness (generalized): Secondary | ICD-10-CM

## 2022-12-23 DIAGNOSIS — R29818 Other symptoms and signs involving the nervous system: Secondary | ICD-10-CM

## 2022-12-23 DIAGNOSIS — R2689 Other abnormalities of gait and mobility: Secondary | ICD-10-CM

## 2022-12-23 NOTE — Therapy (Addendum)
OUTPATIENT PHYSICAL TREATMENT, Progress Note, and Recertification   Patient Name: Luis Domangue Enns Jr. MRN: [id number] DOB:[birth year], 67 y.o., male Today's Date: 12/23/2022   PCP: Emilio Aspen, MD REFERRING PROVIDER: Charlton Amor, PA-C   Progress Note Reporting Period 10/17/22 to 12/23/22  See note below for Objective Data and Assessment of Progress/Goals.     END OF SESSION:  PT End of Session - 12/23/22 1620     Visit Number 10    Number of Visits 16    Date for PT Re-Evaluation 12/25/22    Authorization Type Medicare/medicaid    PT Start Time 1615    PT Stop Time 1700    PT Time Calculation (min) 45 min    Equipment Utilized During Treatment Other (comment)   aquatic weights, pool noodles, large bar bell   Activity Tolerance Patient tolerated treatment well    Behavior During Therapy WFL for tasks assessed/performed             Past Medical History:  Diagnosis Date   Bilateral foot-drop 10/19/2018   Bursitis, trochanteric    Episodic   Carpal tunnel syndrome on right    Degenerative disk disease    l5-S1   FSH (facioscapulohumeral muscular dystrophy) 10/07/2017   Gait abnormality 10/19/2018   GERD (gastroesophageal reflux disease)    Hemorrhoids    with anal fissures   Left lateral epicondylitis    Parkinson's disease 10/19/2018   Skin cancer    right temple area   Past Surgical History:  Procedure Laterality Date   CHOLECYSTECTOMY  2007   HEMORRHOIDECTOMY WITH HEMORRHOID BANDING     IR KYPHO LUMBAR INC FX REDUCE BONE BX UNI/BIL CANNULATION INC/IMAGING  01/10/2020   IR KYPHO LUMBAR INC FX REDUCE BONE BX UNI/BIL CANNULATION INC/IMAGING  05/31/2022   Patient Active Problem List   Diagnosis Date Noted   Spinal stenosis of lumbar region with neurogenic claudication 09/18/2022   Low serum albumin    Lumbar compression fracture 06/04/2022   Compression fracture of lumbar vertebra 05/27/2022   Other chronic pain 10/18/2021   Chronic  constipation 10/18/2021   Gait abnormality 10/19/2018   Bilateral foot-drop 10/19/2018   Parkinson's disease 10/19/2018   Swelling of lower leg - Right  03/06/2018   Tremor of right hand 12/19/2017   FSH (facioscapulohumeral muscular dystrophy) 10/07/2017   Dilated cardiomyopathy 05/02/2017   Upper airway cough syndrome 03/18/2014   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  03/17/2014   Abnormal chest xray 03/16/2014    ONSET DATE: increased issue since recent hx of falling 05/25/22  REFERRING DIAG: G20.A1 (ICD-10-CM) - Parkinson disease G71.00 (ICD-10-CM) - Muscular dystrophy, unspecified S32.000A (ICD-10-CM) - Compression of lumbar vertebra (HCC)  THERAPY DIAG:  Muscle weakness (generalized)  Other abnormalities of gait and mobility  Unsteadiness on feet  Difficulty in walking, not elsewhere classified  Other symptoms and signs involving the nervous system  Rationale for Evaluation and Treatment: Rehabilitation  SUBJECTIVE:  SUBJECTIVE STATEMENT: Received shots to right shoulder which has helped some with pain  Pt accompanied by: significant other  PERTINENT HISTORY: 67 y.o. male with medical history significant of Parkinson's disease on S[place], Chronic severe neuropathic pain, FSHD Muscular dystrophy followed by Dr Baron Sane at [facility], chronic debility uses walker who was hospitalized with fall from standing after which he has had increase pain in his lumbar region. Dx of L3 compression fx, R middle finger fx, s/p kyphoplasty 05/31/22, pt was d/c from rehab on 06/12/22        PAIN:  Are you having pain? Yes: NPRS scale: 9/10 Pain location: right shoulder, low back Pain description: intense  Aggravating factors: staying still Relieving factors: movement  PRECAUTIONS: Fall  WEIGHT BEARING  RESTRICTIONS: No  FALLS: Has patient fallen in last 6 months? Yes. Number of falls 2  LIVING ENVIRONMENT: Lives with: lives with their spouse Lives in: House/apartment Stairs: Has a ramp and a lift.  Has following equipment at home: Dan Humphreys - 2 wheeled, Wheelchair (power), and Lift seat, Lift chair, in the process of trying to get a hospital bed.  PLOF: Independent with household mobility with device, Needs assistance with ADLs, Needs assistance with homemaking, and Needs assistance with gait Pt reports he is typically able to use his lift chair and walk to/from bathroom.  Has elevating toilet LIFT.   PATIENT GOALS: continue aquatic therapy, improve standing posture/tolerance  OBJECTIVE:   TODAY'S TREATMENT: 12/23/22 Activity Comments  Transfer training -max A for sit-stand -CGA w/ RW for pivoting -car transfers min A  Gait training CGA-min A level surfaces x 60 ft w/ RW, training in turning in place  Bed mobility training Sit to supine: min A for limb advancement and trials of leg lifter Supine to sit: mod A for trunk support and assist for limb advancement  Static standing -back to counter top  Static stretching/PROM -BLE all major muscle groups 4x60 sec          PATIENT EDUCATION: Education details: discussion of continued POC details  Person educated: Patient and Spouse Education method: Explanation Education comprehension: verbalized understanding  HOME EXERCISE PROGRAM: Access Code: [email or web address] URL: [email or web address] Date: 08/20/2022 Prepared by: Shary Decamp  Exercises - Seated Hamstring Curls with Resistance  - 1 x daily - 7 x weekly - 3 sets - 5-8 reps - Seated Hip Abduction with Resistance  - 1 x daily - 7 x weekly - 3 sets - 10 reps  Manual Muscle Testing: assessed in sitting  Hip flexion: 3+/5 bilat Hip ext: 3-/5 Hip adduction: 2/5 Hip abduction: 4-/5 Knee extension: 4-/5 Knee flexion: 3-/5 Ankle dorsiflexion: 2-/5 Ankle plantarflexion:  not assessed by good muscle bulk palpated and able to complete full ROM in sitting  GOALS: Goals reviewed with patient? Yes  SHORT TERM GOALS: Target date: 01/14/2023      Pt will be independent with initial HEP with spouse's supervision in order to maintain BLE strength to improve functional mobility Baseline: Goal status: MET   2.  Pt will perform sit <> stands with mod A in order to demo improved functional mobility for transfers Baseline: max A Goal status: IN PROGRESS   3.  Pt will perform stand pivot transfer with RW with mod A in order to demo improved functional transfers for home.  Baseline: sit-stand max A, pivoting w/ CGA-min A Goal status: GOAL MET   4.  Pt will perform static standing w/ upright posture x 30 sec intervals with UE support to improve  social interaction and environmental scanning Baseline: unable; with hips against counter/table Goal status: MET  LONG TERM GOALS: Target date: 02/04/2023     Pt will be independent with final HEP with spouse's supervision in order to build upon functional gains made in therapy (which may include aquatic intervention) Baseline:  Goal status: IN PROGRESS   2.   Pt will perform sit <> stands with min A in order to demo improved functional mobility for transfers and decreased caregiver burden. Baseline: Max A Goal status: IN PROGRESS   3.  Pt will ambulate at least 100' with RW and supervision/min guard in order to demo improved household mobility.  Baseline: 53 ft CGA; 60 ft CGA Goal status: REVISED   4.  Pt will perform bed mobility with min assist in order to improve functional mobility/decr caregiver burden.  Baseline: mod A Goal status: IN PROGRESS   ASSESSMENT:  CLINICAL IMPRESSION: Training in functional mobility to maintain level of assistance.  Max A for sit to stand needed due to deficits/weakness from MD.  Improved stability and safety with upright standing trials using external support through furniture to  assist in hip extension and trunk extension albeit with compensatory LE posturing due to plantarflexion contractures.  Gait training performed with trials using RW to improve safety with turns and compensatory trunk flexion needed due to trunk weakness and prevailing back pain from his multiple co morbidities and surgical history.  Patient has two progressive disease processes conspiring against him (MD and PD) and requires continued services for skilled maintenance to prevent further sequelae of disease process and requires skilled training in use of relevant AE and further caregiver training for techniques in positioning and handling to help reduce risk for further contracture development.  Due to nature of MD supervision and assistance needed to prevent further weakness and loss of function.  Discussed use of standing frame in home environment, but caregiver reports this piece of equipment would be too large and obstructive in their home.   OBJECTIVE IMPAIRMENTS: Abnormal gait, decreased activity tolerance, decreased balance, decreased coordination, decreased endurance, decreased mobility, difficulty walking, decreased strength, impaired flexibility, impaired tone, postural dysfunction, and pain.   ACTIVITY LIMITATIONS: lifting, sitting, standing, squatting, transfers, bed mobility, bathing, toileting, dressing, reach over head, and locomotion level  PARTICIPATION LIMITATIONS:  self-care and reduced ability to ambulate household distances per PLOF  PERSONAL FACTORS: Past/current experiences, Time since onset of injury/illness/exacerbation, and 3+ comorbidities: PD, MD, lumbar fx  are also affecting patient's functional outcome.   REHAB POTENTIAL: Good  CLINICAL DECISION MAKING: Evolving/moderate complexity  EVALUATION COMPLEXITY: Moderate  PLAN:  PT FREQUENCY: 1x/week  PT DURATION: 6 weeks  PLANNED INTERVENTIONS: Therapeutic exercises, Therapeutic activity, Neuromuscular re-education,  Balance training, Gait training, Patient/Family education, Self Care, Joint mobilization, Stair training, Vestibular training, Canalith repositioning, Orthotic/Fit training, DME instructions, Aquatic Therapy, Dry Needling, Electrical stimulation, Wheelchair mobility training, Cryotherapy, Moist heat, Taping, and Manual therapy  PLAN FOR NEXT SESSION: cont. aquatic therapy, upright standing posture/techniques, standing frame?   4:20 PM, 12/23/22 M. Shary Decamp, PT, DPT Physical Therapist-  Office Number: [phone]

## 2022-12-24 NOTE — Addendum Note (Signed)
Addended by: Dion Body on: 12/24/2022 11:53 AM   Modules accepted: Orders

## 2022-12-31 ENCOUNTER — Ambulatory Visit: Payer: Medicare Other

## 2023-01-06 ENCOUNTER — Encounter: Payer: Self-pay | Admitting: Physical Medicine and Rehabilitation

## 2023-01-06 ENCOUNTER — Encounter: Payer: Medicare Other | Attending: Physical Medicine & Rehabilitation | Admitting: Physical Medicine and Rehabilitation

## 2023-01-06 VITALS — BP 136/86 | HR 63 | Ht 71.0 in | Wt 150.0 lb

## 2023-01-06 DIAGNOSIS — G7102 Facioscapulohumeral muscular dystrophy: Secondary | ICD-10-CM | POA: Diagnosis not present

## 2023-01-06 DIAGNOSIS — G8929 Other chronic pain: Secondary | ICD-10-CM | POA: Diagnosis not present

## 2023-01-06 DIAGNOSIS — M7918 Myalgia, other site: Secondary | ICD-10-CM | POA: Diagnosis not present

## 2023-01-06 DIAGNOSIS — R269 Unspecified abnormalities of gait and mobility: Secondary | ICD-10-CM | POA: Diagnosis not present

## 2023-01-06 MED ORDER — LIDOCAINE HCL 1 % IJ SOLN
6.0000 mL | Freq: Once | INTRAMUSCULAR | Status: AC
  Administered 2023-02-28: 6 mL

## 2023-01-06 MED ORDER — DULOXETINE HCL 30 MG PO CPEP: 30.0000 mg | ORAL_CAPSULE | Freq: Every day | ORAL | 1 refills | Status: DC

## 2023-01-06 NOTE — Progress Notes (Addendum)
Pt is a 67 yr old male with hx of FSH muscular dystrophy- dx'd in 1992- - also has Parkinson's disease, dilated cardiomyopathy- EF 41%; compression fx s/p fall s/p kyphoplasty at L5; also has disc protrusion Left L5/S1 and L disc protrusion at Left L1-2.   Here for f/u on his FSH muscular dystrophy.  And trigger point injections.       Shoulder pain has improved.  Yesterday hasn't been a good day.  But the last few weeks, trigger point injections have been very helpful.   Has appointment with Dr Cherre Huger late may, however might have to reschedule end of May appointment. They l/m that pt needs to reschedule.   Dr Orson Aloe- for Medicare exam- last exam-  To see him for 6 months, for documentation.   Increased Cymbalta to 60 mg daily at last visit, but doesn't know if that's helped.  Drags him down some.  Has made him more irritable.   Taking Celebrex-BID-  with a PPI for ulcer prevention.   Still on Dilaudid 10 mg  4x/day- from PCP-   Got the MOST signed- has to bring to [facility] Physician to get in file.    Bowels are working well Bladder working well- not taking Flomax-    Exam: Sitting in transport w/c- accompanied by wife, NAD FSH notable with more winging of R shoulder/scapula than L side- more muslce atrophy on R rhomboids than L.    Plan: Will reduce Cymbalta/Duloxetine to 30 mg daily since causing side effects- of irritability- doesn't matter if take sin AM or PM. Will send in 3 months supply with 1 refill.   2. Will need to bring into a [facility]ician to get it scanned into computer.    3. Con't Celebrex BID and Dilaudid from PCP.   4. Dr Terrace Arabia prescribes Sinemet.   5. Patient here for trigger point injections for  Consent done and on chart.  Cleaned areas with alcohol and injected using a 27 gauge 1.5 inch needle  Injected 6 cc none wasted Using 1% Lidocaine with no EPI  Upper traps B/L  Levators B/L  Posterior scalenes Middle scalenes- B/L  Splenius  Capitus- B/L  Pectoralis Major- B/L  Rhomboids B/L x3 Infraspinatus Teres Major/minor Thoracic paraspinals Lumbar paraspinals Other injections-     There was no bleeding or complications.  Patient was advised to drink a lot of water on day after injections to flush system Will have increased soreness for 12-48 hours after injections.  Can use Lidocaine patches the day AFTER injections Can use theracane on day of injections in places didn't inject Can use heating pad 4-6 hours AFTER injections  6. F/U in 6 weeks- for trigger point injections- and f/u on FSH  7. Celestone of R biceps tendinitis next appt.

## 2023-01-06 NOTE — Patient Instructions (Signed)
Plan: Will reduce Cymbalta/Duloxetine to 30 mg daily since causing side effects- of irritability- doesn't matter if take sin AM or PM. Will send in 3 months supply with 1 refill.   2. Will need to bring into a [facility] Physician to get it scanned into computer.    3. Con't Celebrex BID and Dilaudid from PCP.   4. Dr Terrace Arabia prescribes Sinemet.   5. Patient here for trigger point injections for  Consent done and on chart.  Cleaned areas with alcohol and injected using a 27 gauge 1.5 inch needle  Injected 6 cc none wasted Using 1% Lidocaine with no EPI  Upper traps B/L  Levators B/L  Posterior scalenes Middle scalenes- B/L  Splenius Capitus- B/L  Pectoralis Major- B/L  Rhomboids B/L x3 Infraspinatus Teres Major/minor Thoracic paraspinals Lumbar paraspinals Other injections-     There was no bleeding or complications.  Patient was advised to drink a lot of water on day after injections to flush system Will have increased soreness for 12-48 hours after injections.  Can use Lidocaine patches the day AFTER injections Can use theracane on day of injections in places didn't inject Can use heating pad 4-6 hours AFTER injections  6. F/U in 6 weeks- for trigger point injections- and f/u on [facility]

## 2023-01-13 ENCOUNTER — Ambulatory Visit: Payer: Medicare Other | Attending: Physician Assistant

## 2023-01-13 DIAGNOSIS — M6281 Muscle weakness (generalized): Secondary | ICD-10-CM

## 2023-01-13 DIAGNOSIS — R2689 Other abnormalities of gait and mobility: Secondary | ICD-10-CM | POA: Diagnosis present

## 2023-01-13 DIAGNOSIS — R2681 Unsteadiness on feet: Secondary | ICD-10-CM | POA: Diagnosis present

## 2023-01-13 DIAGNOSIS — R262 Difficulty in walking, not elsewhere classified: Secondary | ICD-10-CM | POA: Diagnosis present

## 2023-01-13 DIAGNOSIS — R29818 Other symptoms and signs involving the nervous system: Secondary | ICD-10-CM | POA: Diagnosis present

## 2023-01-13 DIAGNOSIS — G7102 Facioscapulohumeral muscular dystrophy: Secondary | ICD-10-CM | POA: Diagnosis present

## 2023-01-13 DIAGNOSIS — R29898 Other symptoms and signs involving the musculoskeletal system: Secondary | ICD-10-CM | POA: Insufficient documentation

## 2023-01-13 NOTE — Therapy (Signed)
OUTPATIENT PHYSICAL TREATMENT   Patient Name: Luis [name]. MRN: [id number] DOB:06/14/1956, 67 y.o., male Today's Date: 01/13/2023   PCP: Emilio Aspen, MD REFERRING PROVIDER: Charlton Amor, PA-C    END OF SESSION:  PT End of Session - 01/13/23 1620     Visit Number 11    Number of Visits 16    Date for PT Re-Evaluation 02/04/23    Authorization Type Medicare/medicaid    Progress Note Due on Visit 20    PT Start Time 1615    PT Stop Time 1700    PT Time Calculation (min) 45 min    Equipment Utilized During Treatment Other (comment)   aquatic weights, pool noodles, large bar bell   Activity Tolerance Patient tolerated treatment well    Behavior During Therapy WFL for tasks assessed/performed             Past Medical History:  Diagnosis Date   Bilateral foot-drop 10/19/2018   Bursitis, trochanteric    Episodic   Carpal tunnel syndrome on right    Degenerative disk disease    l5-S1   FSH (facioscapulohumeral muscular dystrophy) (HCC) 10/07/2017   Gait abnormality 10/19/2018   GERD (gastroesophageal reflux disease)    Hemorrhoids    with anal fissures   Left lateral epicondylitis    Parkinson's disease 10/19/2018   Skin cancer    right temple area   Past Surgical History:  Procedure Laterality Date   CHOLECYSTECTOMY  2007   HEMORRHOIDECTOMY WITH HEMORRHOID BANDING     IR KYPHO LUMBAR INC FX REDUCE BONE BX UNI/BIL CANNULATION INC/IMAGING  01/10/2020   IR KYPHO LUMBAR INC FX REDUCE BONE BX UNI/BIL CANNULATION INC/IMAGING  05/31/2022   Patient Active Problem List   Diagnosis Date Noted   Spinal stenosis of lumbar region with neurogenic claudication 09/18/2022   Low serum albumin    Lumbar compression fracture (HCC) 06/04/2022   Compression fracture of lumbar vertebra (HCC) 05/27/2022   Other chronic pain 10/18/2021   Chronic constipation 10/18/2021   Gait abnormality 10/19/2018   Bilateral foot-drop 10/19/2018   Parkinson's disease 10/19/2018    Swelling of lower leg - Right  03/06/2018   Tremor of right hand 12/19/2017   FSH (facioscapulohumeral muscular dystrophy) (HCC) 10/07/2017   Dilated cardiomyopathy (HCC) 05/02/2017   Upper airway cough syndrome 03/18/2014   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  03/17/2014   Abnormal chest xray 03/16/2014    ONSET DATE: increased issue since recent hx of falling 05/25/22  REFERRING DIAG: G20.A1 (ICD-10-CM) - Parkinson disease G71.00 (ICD-10-CM) - Muscular dystrophy, unspecified S32.000A (ICD-10-CM) - Compression of lumbar vertebra (HCC)  THERAPY DIAG:  Muscle weakness (generalized)  Other abnormalities of gait and mobility  Unsteadiness on feet  Difficulty in walking, not elsewhere classified  Other symptoms and signs involving the nervous system  FSH (facioscapulohumeral muscular dystrophy) (HCC)  Rationale for Evaluation and Treatment: Rehabilitation  SUBJECTIVE:  SUBJECTIVE STATEMENT: Having a lot more discomfort in right shoulder  Pt accompanied by: significant other  PERTINENT HISTORY: 67 y.o. male with medical history significant of Parkinson's disease on Sinement, Chronic severe neuropathic pain, FSHD Muscular dystrophy followed by Dr Baron Sane at [facility] clinic, chronic debility uses walker who was hospitalized with fall from standing after which he has had increase pain in his lumbar region. Dx of L3 compression fx, R middle finger fx, s/p kyphoplasty 05/31/22, pt was d/c from rehab on 06/12/22        PAIN:  Are you having pain? Yes: NPRS scale: 9/10 Pain location: right shoulder, low back Pain description: intense  Aggravating factors: staying still Relieving factors: movement  PRECAUTIONS: Fall  WEIGHT BEARING RESTRICTIONS: No  FALLS: Has patient fallen in last 6 months? Yes. Number of  falls 2  LIVING ENVIRONMENT: Lives with: lives with their spouse Lives in: House/apartment Stairs: Has a ramp and a lift.  Has following equipment at home: Dan Humphreys - 2 wheeled, Wheelchair (power), and Lift seat, Lift chair, in the process of trying to get a hospital bed.  PLOF: Independent with household mobility with device, Needs assistance with ADLs, Needs assistance with homemaking, and Needs assistance with gait Pt reports he is typically able to use his lift chair and walk to/from bathroom.  Has elevating toilet LIFT.   PATIENT GOALS: continue aquatic therapy, improve standing posture/tolerance  OBJECTIVE:   TODAY'S TREATMENT: 01/13/23 Activity Comments  Seated LE PRE 3x10, submax effort to prevent undue fatigue/muscle dysfunction  Transfer training Max A sit-stand from elevated seat height, cues for momentum  Gait training  Bouts of 40-50 ft increments w/ RW and CGA to prevent LOB, level surfaces to maintain functional ambulation  Static stretching BLE 3x60 sec all major muscle groups to prevent/minimize contracture and reduce back pain          TODAY'S TREATMENT: 12/23/22 Activity Comments  Transfer training -max A for sit-stand -CGA w/ RW for pivoting -car transfers min A  Gait training CGA-min A level surfaces x 60 ft w/ RW, training in turning in place  Bed mobility training Sit to supine: min A for limb advancement and trials of leg lifter Supine to sit: mod A for trunk support and assist for limb advancement  Static standing -back to counter top  Static stretching/PROM -BLE all major muscle groups 4x60 sec          PATIENT EDUCATION: Education details: discussion of continued POC details  Person educated: Patient and Spouse Education method: Explanation Education comprehension: verbalized understanding  HOME EXERCISE PROGRAM: Access Code: [email or web address] URL: [email or web address] Date: 08/20/2022 Prepared by: Shary Decamp  Exercises - Seated  Hamstring Curls with Resistance  - 1 x daily - 7 x weekly - 3 sets - 5-8 reps - Seated Hip Abduction with Resistance  - 1 x daily - 7 x weekly - 3 sets - 10 reps  Manual Muscle Testing: assessed in sitting  Hip flexion: 3+/5 bilat Hip ext: 3-/5 Hip adduction: 2/5 Hip abduction: 4-/5 Knee extension: 4-/5 Knee flexion: 3-/5 Ankle dorsiflexion: 2-/5 Ankle plantarflexion: not assessed by good muscle bulk palpated and able to complete full ROM in sitting  GOALS: Goals reviewed with patient? Yes  SHORT TERM GOALS: Target date: 01/14/2023      Pt will be independent with initial HEP with spouse's supervision in order to maintain BLE strength to improve functional mobility Baseline: Goal status: MET   2.  Pt will perform sit <> stands with mod  A in order to demo improved functional mobility for transfers Baseline: max A Goal status: IN PROGRESS   3.  Pt will perform stand pivot transfer with RW with mod A in order to demo improved functional transfers for home.  Baseline: sit-stand max A, pivoting w/ CGA-min A Goal status: GOAL MET   4.  Pt will perform static standing w/ upright posture x 30 sec intervals with UE support to improve social interaction and environmental scanning Baseline: unable; with hips against counter/table Goal status: MET  LONG TERM GOALS: Target date: 02/04/2023     Pt will be independent with final HEP with spouse's supervision in order to build upon functional gains made in therapy (which may include aquatic intervention) Baseline:  Goal status: IN PROGRESS   2.   Pt will perform sit <> stands with min A in order to demo improved functional mobility for transfers and decreased caregiver burden. Baseline: Max A Goal status: IN PROGRESS   3.  Pt will ambulate at least 100' with RW and supervision/min guard in order to demo improved household mobility.  Baseline: 53 ft CGA; 60 ft CGA Goal status: REVISED   4.  Pt will perform bed mobility with min assist  in order to improve functional mobility/decr caregiver burden.  Baseline: mod A Goal status: IN PROGRESS   ASSESSMENT:  CLINICAL IMPRESSION: Training in functional mobility to maintain CLOF and reduce further decline.  Training in submax effort LE PRE to maintain strength and prevent further dysfunction/atrophy. Short bout ambulation to maintain ability to ambulate household distances for toileting at home. Stretching by therapist to decrease back pain and prevent further contracture.   OBJECTIVE IMPAIRMENTS: Abnormal gait, decreased activity tolerance, decreased balance, decreased coordination, decreased endurance, decreased mobility, difficulty walking, decreased strength, impaired flexibility, impaired tone, postural dysfunction, and pain.   ACTIVITY LIMITATIONS: lifting, sitting, standing, squatting, transfers, bed mobility, bathing, toileting, dressing, reach over head, and locomotion level  PARTICIPATION LIMITATIONS:  self-care and reduced ability to ambulate household distances per PLOF  PERSONAL FACTORS: Past/current experiences, Time since onset of injury/illness/exacerbation, and 3+ comorbidities: PD, MD, lumbar fx  are also affecting patient's functional outcome.   REHAB POTENTIAL: Good  CLINICAL DECISION MAKING: Evolving/moderate complexity  EVALUATION COMPLEXITY: Moderate  PLAN:  PT FREQUENCY: 1x/week  PT DURATION: 6 weeks  PLANNED INTERVENTIONS: Therapeutic exercises, Therapeutic activity, Neuromuscular re-education, Balance training, Gait training, Patient/Family education, Self Care, Joint mobilization, Stair training, Vestibular training, Canalith repositioning, Orthotic/Fit training, DME instructions, Aquatic Therapy, Dry Needling, Electrical stimulation, Wheelchair mobility training, Cryotherapy, Moist heat, Taping, and Manual therapy  PLAN FOR NEXT SESSION: cont. aquatic therapy, upright standing posture/techniques, standing frame?   4:21 PM, 01/13/23 M. Shary Decamp, PT, DPT Physical Therapist- [place] Office Number: [phone]

## 2023-01-17 ENCOUNTER — Ambulatory Visit (INDEPENDENT_AMBULATORY_CARE_PROVIDER_SITE_OTHER): Payer: Medicare Other | Admitting: Podiatry

## 2023-01-17 DIAGNOSIS — M7751 Other enthesopathy of right foot: Secondary | ICD-10-CM

## 2023-01-17 DIAGNOSIS — Q828 Other specified congenital malformations of skin: Secondary | ICD-10-CM

## 2023-01-17 NOTE — Progress Notes (Signed)
Subjective:  Patient ID: Luis Sites., male    DOB: Jul 25, 1956,  MRN: [id number]  Chief Complaint  Patient presents with   Callouses    67 y.o. male presents with the above complaint. Patient presents with right third digit PIPJ capsulitis with porokeratotic lesion painful to touch is progressive gotten worse worse with ambulation worse with pressure he was last seen by Dr. Charlsie Merles who did a debridement injection he denies seeing anyone else prior to seeing me for this.  Denies any other acute complaints and would like to have it debrided down again.  This hurts with ambulation worse with pressure pain scale 7 out of 10   Review of Systems: Negative except as noted in the HPI. Denies N/V/F/Ch.  Past Medical History:  Diagnosis Date   Bilateral foot-drop 10/19/2018   Bursitis, trochanteric    Episodic   Carpal tunnel syndrome on right    Degenerative disk disease    l5-S1   FSH (facioscapulohumeral muscular dystrophy) (HCC) 10/07/2017   Gait abnormality 10/19/2018   GERD (gastroesophageal reflux disease)    Hemorrhoids    with anal fissures   Left lateral epicondylitis    Parkinson's disease 10/19/2018   Skin cancer    right temple area    Current Outpatient Medications:    carbidopa-levodopa (SINEMET CR) 50-200 MG tablet, Take 1 tablet by mouth in the morning, at noon, and at bedtime., Disp: 270 tablet, Rfl: 4   celecoxib (CELEBREX) 200 MG capsule, Take 200 mg by mouth daily as needed., Disp: , Rfl:    diazepam (VALIUM) 5 MG tablet, Take 2 tablets (10 mg total) by mouth 2 (two) times daily as needed for anxiety., Disp: 20 tablet, Rfl: 0   diclofenac Sodium (VOLTAREN) 1 % GEL, Apply 2 g topically 4 (four) times daily., Disp: 2 g, Rfl: 0   DULoxetine (CYMBALTA) 30 MG capsule, Take 1 capsule (30 mg total) by mouth daily., Disp: 90 capsule, Rfl: 1   gabapentin (NEURONTIN) 600 MG tablet, Take 1 tablet (600 mg total) by mouth 4 (four) times daily. For nerve pain, Disp: 360 tablet,  Rfl: 4   HYDROmorphone (DILAUDID) 8 MG tablet, Take 10 mg by mouth in the morning, at noon, in the evening, and at bedtime., Disp: , Rfl:    polyethylene glycol (MIRALAX / GLYCOLAX) 17 g packet, Take 17 g by mouth at bedtime., Disp: , Rfl:    prednisoLONE acetate (PRED FORTE) 1 % ophthalmic suspension, Place 1 drop into both eyes every other day., Disp: , Rfl: 0   timolol (BETIMOL) 0.5 % ophthalmic solution, Place 1 drop into both eyes 2 (two) times daily., Disp: , Rfl:   Current Facility-Administered Medications:    lidocaine (XYLOCAINE) 1 % (with pres) injection 6 mL, 6 mL, Other, Once, Lovorn, Megan, MD  Social History   Tobacco Use  Smoking Status Never  Smokeless Tobacco Never    No Known Allergies Objective:  There were no vitals filed for this visit. There is no height or weight on file to calculate BMI. Constitutional Well developed. Well nourished.  Vascular Dorsalis pedis pulses palpable bilaterally. Posterior tibial pulses palpable bilaterally. Capillary refill normal to all digits.  No cyanosis or clubbing noted. Pedal hair growth normal.  Neurologic Normal speech. Oriented to person, place, and time. Epicritic sensation to light touch grossly present bilaterally.  Dermatologic Pain on palpation of right third digit PIPJ.  Pain with range of motion of the PIPJ joint.  Hyperkeratotic lesion with central nucleated  core noted to the right third digit.  Orthopedic: Normal joint ROM without pain or crepitus bilaterally. No visible deformities. No bony tenderness.   Radiographs: None Assessment:   1. Capsulitis of toe, right   2. Porokeratosis    Plan:  Patient was evaluated and treated and all questions answered.  Right third digit PIPJ capsulitis with underlying porokeratosis -All questions and concerns were discussed with the patient in extensive detail.  The a lot of pain today he is experiencing he will benefit from a steroid injection.  Patient agrees with  plan to proceed with steroid injection -A steroid injection was performed at right third PIPJ joint using 1% plain Lidocaine and 10 mg of Kenalog. This was well tolerated. -Using chisel blade handle as a courtesy the lesion was debrided down to healthy striated tissue no complication noted no pinpoint bleeding noted   No follow-ups on file.

## 2023-01-20 ENCOUNTER — Ambulatory Visit: Payer: Medicare Other

## 2023-01-20 ENCOUNTER — Telehealth: Payer: Self-pay

## 2023-01-20 DIAGNOSIS — R29898 Other symptoms and signs involving the musculoskeletal system: Secondary | ICD-10-CM

## 2023-01-20 DIAGNOSIS — M6281 Muscle weakness (generalized): Secondary | ICD-10-CM

## 2023-01-20 DIAGNOSIS — R2689 Other abnormalities of gait and mobility: Secondary | ICD-10-CM

## 2023-01-20 DIAGNOSIS — R2681 Unsteadiness on feet: Secondary | ICD-10-CM

## 2023-01-20 DIAGNOSIS — R262 Difficulty in walking, not elsewhere classified: Secondary | ICD-10-CM

## 2023-01-20 NOTE — Telephone Encounter (Signed)
1230 pm.  Return call made to wife [name].  She is requesting I come to the visit 15-30 minutes before my scheduled appointment on Thursday.  Advised I have a visit prior to them and would be unable to come earlier.  Wife advised that was okay and to keep the 10 am appointment.

## 2023-01-20 NOTE — Therapy (Signed)
OUTPATIENT PHYSICAL TREATMENT   Patient Name: Luis [name]. MRN: [id number] DOB:June 10, 1956, 67 y.o., male Today's Date: 01/20/2023   PCP: Emilio Aspen, MD REFERRING PROVIDER: Charlton Amor, PA-C    END OF SESSION:  PT End of Session - 01/20/23 1625     Visit Number 12    Number of Visits 16    Date for PT Re-Evaluation 02/04/23    Authorization Type Medicare/medicaid    Progress Note Due on Visit 20    PT Start Time 1615    PT Stop Time 1700    PT Time Calculation (min) 45 min    Equipment Utilized During Treatment Other (comment)   aquatic weights, pool noodles, large bar bell   Activity Tolerance Patient tolerated treatment well    Behavior During Therapy WFL for tasks assessed/performed             Past Medical History:  Diagnosis Date   Bilateral foot-drop 10/19/2018   Bursitis, trochanteric    Episodic   Carpal tunnel syndrome on right    Degenerative disk disease    l5-S1   FSH (facioscapulohumeral muscular dystrophy) (HCC) 10/07/2017   Gait abnormality 10/19/2018   GERD (gastroesophageal reflux disease)    Hemorrhoids    with anal fissures   Left lateral epicondylitis    Parkinson's disease 10/19/2018   Skin cancer    right temple area   Past Surgical History:  Procedure Laterality Date   CHOLECYSTECTOMY  2007   HEMORRHOIDECTOMY WITH HEMORRHOID BANDING     IR KYPHO LUMBAR INC FX REDUCE BONE BX UNI/BIL CANNULATION INC/IMAGING  01/10/2020   IR KYPHO LUMBAR INC FX REDUCE BONE BX UNI/BIL CANNULATION INC/IMAGING  05/31/2022   Patient Active Problem List   Diagnosis Date Noted   Spinal stenosis of lumbar region with neurogenic claudication 09/18/2022   Low serum albumin    Lumbar compression fracture (HCC) 06/04/2022   Compression fracture of lumbar vertebra (HCC) 05/27/2022   Other chronic pain 10/18/2021   Chronic constipation 10/18/2021   Gait abnormality 10/19/2018   Bilateral foot-drop 10/19/2018   Parkinson's disease 10/19/2018    Swelling of lower leg - Right  03/06/2018   Tremor of right hand 12/19/2017   FSH (facioscapulohumeral muscular dystrophy) (HCC) 10/07/2017   Dilated cardiomyopathy (HCC) 05/02/2017   Upper airway cough syndrome 03/18/2014   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  03/17/2014   Abnormal chest xray 03/16/2014    ONSET DATE: increased issue since recent hx of falling 05/25/22  REFERRING DIAG: G20.A1 (ICD-10-CM) - Parkinson disease G71.00 (ICD-10-CM) - Muscular dystrophy, unspecified S32.000A (ICD-10-CM) - Compression of lumbar vertebra (HCC)  THERAPY DIAG:  Muscle weakness (generalized)  Other abnormalities of gait and mobility  Unsteadiness on feet  Difficulty in walking, not elsewhere classified  Other symptoms and signs involving the musculoskeletal system  Rationale for Evaluation and Treatment: Rehabilitation  SUBJECTIVE:  SUBJECTIVE STATEMENT: Increased difficulty in walking due to weakness/fatigue  Pt accompanied by: significant other  PERTINENT HISTORY: 67 y.o. male with medical history significant of Parkinson's disease on Sinement, Chronic severe neuropathic pain, FSHD Muscular dystrophy followed by Dr Baron Sane at [facility], chronic debility uses walker who was hospitalized with fall from standing after which he has had increase pain in his lumbar region. Dx of L3 compression fx, R middle finger fx, s/p kyphoplasty 05/31/22, pt was d/c from rehab on 06/12/22        PAIN:  Are you having pain? Yes: NPRS scale: 9/10 Pain location: right shoulder, low back Pain description: intense  Aggravating factors: staying still Relieving factors: movement  PRECAUTIONS: Fall  WEIGHT BEARING RESTRICTIONS: No  FALLS: Has patient fallen in last 6 months? Yes. Number of falls 2  LIVING ENVIRONMENT: Lives  with: lives with their spouse Lives in: House/apartment Stairs: Has a ramp and a lift.  Has following equipment at home: Dan Humphreys - 2 wheeled, Wheelchair (power), and Lift seat, Lift chair, in the process of trying to get a hospital bed.  PLOF: Independent with household mobility with device, Needs assistance with ADLs, Needs assistance with homemaking, and Needs assistance with gait Pt reports he is typically able to use his lift chair and walk to/from bathroom.  Has elevating toilet LIFT.   PATIENT GOALS: continue aquatic therapy, improve standing posture/tolerance  OBJECTIVE:    TODAY'S TREATMENT: 01/20/23 Activity Comments  Supine lumbar/LE stretching ROM 3x60 sec sustained holds for SKTC, DKTC, piriformis, hamstring, LTR, gastroc to reduce back pain and reduce degree of LE contracture  Gait training W/ CGA-min A level surfaces w/ RW x 25-35 ft increments to maintain level of ambulation for household distances from chair to bathroom to reduce level of assistance                  PATIENT EDUCATION: Education details: discussion of continued POC details  Person educated: Patient and Spouse Education method: Explanation Education comprehension: verbalized understanding  HOME EXERCISE PROGRAM: Access Code: [email or web address] URL: [email or web address] Date: 08/20/2022 Prepared by: Shary Decamp  Exercises - Seated Hamstring Curls with Resistance  - 1 x daily - 7 x weekly - 3 sets - 5-8 reps - Seated Hip Abduction with Resistance  - 1 x daily - 7 x weekly - 3 sets - 10 reps  Manual Muscle Testing: assessed in sitting  Hip flexion: 3+/5 bilat Hip ext: 3-/5 Hip adduction: 2/5 Hip abduction: 4-/5 Knee extension: 4-/5 Knee flexion: 3-/5 Ankle dorsiflexion: 2-/5 Ankle plantarflexion: not assessed by good muscle bulk palpated and able to complete full ROM in sitting  GOALS: Goals reviewed with patient? Yes  SHORT TERM GOALS: Target date: 01/14/2023      Pt will be  independent with initial HEP with spouse's supervision in order to maintain BLE strength to improve functional mobility Baseline: Goal status: MET   2.  Pt will perform sit <> stands with mod A in order to demo improved functional mobility for transfers Baseline: max A Goal status: IN PROGRESS   3.  Pt will perform stand pivot transfer with RW with mod A in order to demo improved functional transfers for home.  Baseline: sit-stand max A, pivoting w/ CGA-min A Goal status: GOAL MET   4.  Pt will perform static standing w/ upright posture x 30 sec intervals with UE support to improve social interaction and environmental scanning Baseline: unable; with hips against counter/table Goal status: MET  LONG TERM GOALS:  Target date: 02/04/2023     Pt will be independent with final HEP with spouse's supervision in order to build upon functional gains made in therapy (which may include aquatic intervention) Baseline:  Goal status: IN PROGRESS   2.   Pt will perform sit <> stands with min A in order to demo improved functional mobility for transfers and decreased caregiver burden. Baseline: Max A Goal status: IN PROGRESS   3.  Pt will ambulate at least 100' with RW and supervision/min guard in order to demo improved household mobility.  Baseline: 53 ft CGA; 60 ft CGA Goal status: REVISED   4.  Pt will perform bed mobility with min assist in order to improve functional mobility/decr caregiver burden.  Baseline: mod A Goal status: IN PROGRESS   ASSESSMENT:  CLINICAL IMPRESSION: Continued with functional maintenance POC with emphasis on lower body and lumbar stretching to decrease level of back pain to improve functional activity tolerance reporting decrease from baseline 9/10 lumbar pain to 7-8/10 after stretching/ROM.  Decrease in gait tolerance noted today requiring seated rest periods Q 25-35 ft increments.  Continued sessions for maintenance indicated to prevent further decline and sequelae  and contracture.  Max A needed for sit to stand from elevated seat surfaces and pivot transfers with CGA-min A for safety  OBJECTIVE IMPAIRMENTS: Abnormal gait, decreased activity tolerance, decreased balance, decreased coordination, decreased endurance, decreased mobility, difficulty walking, decreased strength, impaired flexibility, impaired tone, postural dysfunction, and pain.   ACTIVITY LIMITATIONS: lifting, sitting, standing, squatting, transfers, bed mobility, bathing, toileting, dressing, reach over head, and locomotion level  PARTICIPATION LIMITATIONS:  self-care and reduced ability to ambulate household distances per PLOF  PERSONAL FACTORS: Past/current experiences, Time since onset of injury/illness/exacerbation, and 3+ comorbidities: PD, MD, lumbar fx  are also affecting patient's functional outcome.   REHAB POTENTIAL: Good  CLINICAL DECISION MAKING: Evolving/moderate complexity  EVALUATION COMPLEXITY: Moderate  PLAN:  PT FREQUENCY: 1x/week  PT DURATION: 6 weeks  PLANNED INTERVENTIONS: Therapeutic exercises, Therapeutic activity, Neuromuscular re-education, Balance training, Gait training, Patient/Family education, Self Care, Joint mobilization, Stair training, Vestibular training, Canalith repositioning, Orthotic/Fit training, DME instructions, Aquatic Therapy, Dry Needling, Electrical stimulation, Wheelchair mobility training, Cryotherapy, Moist heat, Taping, and Manual therapy  PLAN FOR NEXT SESSION: continue with skilled maintenance to prevent sequelae    4:26 PM, 01/20/23 M. Shary Decamp, PT, DPT Physical Therapist- [place] Office Number: [phone]

## 2023-01-23 ENCOUNTER — Other Ambulatory Visit: Payer: Medicare Other

## 2023-01-23 VITALS — BP 100/64 | HR 53

## 2023-01-23 DIAGNOSIS — Z515 Encounter for palliative care: Secondary | ICD-10-CM

## 2023-01-23 NOTE — Progress Notes (Signed)
PATIENT NAME: Luis E [place] Jr. DOB: [birth year] MRN: [id number]  PRIMARY CARE PROVIDER: Emilio Aspen, MD  RESPONSIBLE PARTY:  Acct ID - Guarantor Home Phone Work Phone Relationship Acct Type  [id number] - Stenerson,Jerardo * [name] [phone] Self P/F     [address] [address], [place] [address]   Home visit completed with patient and wife-Luis [name].  ACP:  DNR in the home.  Most recently completed the MOST form.  Reviewed MOST form and patient confirms wishes.   Depression:  Patient expressed feelings of sadness over the loss of his independence.  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  On occasion, he will go out with his friend for lunch at a restaurant or just have a meal at his friend's home.  He states this has been helpful and is a nice outlet.  He voices concern over his wife and the amount of work she does for him.   Muscular Dystrophy:  Continues with gradual decline in mobility.  Patient requires assistance of wife to stand even with his lift chair.   Unable to stand straight up when using the walker to ambulate.  Wife feels he is leaning over the walker more and even hand grip is changing.  No recent falls reported.  Patient has a motorized wheelchair but has limited use. Only using in the driveway and does not have a vehicle that can transport it.   Patient recently seen for trigger point injections.  Feels this has been helpful   Pain:  Chronic back pain now on dilaudid 10 mg per wife.  This has been helpful in managing pain but does not alleviate pain per patient.   Patient understands pain may not be alleviated but appreciates a lower level of pain.   Wife requested to alternate phone call visits with in-person visits.  Phone visit scheduled for next month and home visit scheduled for July.    CODE STATUS: DNR ADVANCED DIRECTIVES: Yes MOST FORM: Yes PPS: 40%   PHYSICAL EXAM:   VITALS: Today's Vitals   01/23/23  2023  BP: 100/64  Pulse: (!) 53  SpO2: 94%    LUNGS: clear to auscultation  CARDIAC: Cor RRR}  EXTREMITIES: - for edema SKIN: Skin color, texture, turgor normal. No rashes or lesions or mobility and turgor normal  NEURO: positive for gait problems and weakness       Truitt Merle, RN

## 2023-02-03 ENCOUNTER — Encounter: Payer: Medicare Other | Admitting: Physical Medicine and Rehabilitation

## 2023-02-04 ENCOUNTER — Ambulatory Visit: Payer: Medicare Other | Attending: Physician Assistant

## 2023-02-04 DIAGNOSIS — R29898 Other symptoms and signs involving the musculoskeletal system: Secondary | ICD-10-CM | POA: Diagnosis present

## 2023-02-04 DIAGNOSIS — M6281 Muscle weakness (generalized): Secondary | ICD-10-CM | POA: Insufficient documentation

## 2023-02-04 DIAGNOSIS — R2689 Other abnormalities of gait and mobility: Secondary | ICD-10-CM | POA: Insufficient documentation

## 2023-02-04 DIAGNOSIS — G7102 Facioscapulohumeral muscular dystrophy: Secondary | ICD-10-CM | POA: Insufficient documentation

## 2023-02-04 DIAGNOSIS — R2681 Unsteadiness on feet: Secondary | ICD-10-CM | POA: Insufficient documentation

## 2023-02-04 DIAGNOSIS — R262 Difficulty in walking, not elsewhere classified: Secondary | ICD-10-CM | POA: Diagnosis present

## 2023-02-04 NOTE — Therapy (Signed)
OUTPATIENT PHYSICAL TREATMENT   Patient Name: Luis [name]. MRN: [id number] DOB:June 15, 1956, 67 y.o., male Today's Date: 02/04/2023   PCP: Luis Aspen, MD REFERRING PROVIDER: Charlton Amor, PA-C    END OF SESSION:  PT End of Session - 02/04/23 1616     Visit Number 13    Number of Visits 16    Date for PT Re-Evaluation 02/04/23    Authorization Type Medicare/medicaid    Progress Note Due on Visit 20    PT Start Time 1615    PT Stop Time 1700    PT Time Calculation (min) 45 min    Equipment Utilized During Treatment Other (comment)   aquatic weights, pool noodles, large bar bell   Activity Tolerance Patient tolerated treatment well    Behavior During Therapy WFL for tasks assessed/performed             Past Medical History:  Diagnosis Date   Bilateral foot-drop 10/19/2018   Bursitis, trochanteric    Episodic   Carpal tunnel syndrome on right    Degenerative disk disease    l5-S1   FSH (facioscapulohumeral muscular dystrophy) (HCC) 10/07/2017   Gait abnormality 10/19/2018   GERD (gastroesophageal reflux disease)    Hemorrhoids    with anal fissures   Left lateral epicondylitis    Parkinson's disease 10/19/2018   Skin cancer    right temple area   Past Surgical History:  Procedure Laterality Date   CHOLECYSTECTOMY  2007   HEMORRHOIDECTOMY WITH HEMORRHOID BANDING     IR KYPHO LUMBAR INC FX REDUCE BONE BX UNI/BIL CANNULATION INC/IMAGING  01/10/2020   IR KYPHO LUMBAR INC FX REDUCE BONE BX UNI/BIL CANNULATION INC/IMAGING  05/31/2022   Patient Active Problem List   Diagnosis Date Noted   Spinal stenosis of lumbar region with neurogenic claudication 09/18/2022   Low serum albumin    Lumbar compression fracture (HCC) 06/04/2022   Compression fracture of lumbar vertebra (HCC) 05/27/2022   Other chronic pain 10/18/2021   Chronic constipation 10/18/2021   Gait abnormality 10/19/2018   Bilateral foot-drop 10/19/2018   Parkinson's disease 10/19/2018    Swelling of lower leg - Right  03/06/2018   Tremor of right hand 12/19/2017   FSH (facioscapulohumeral muscular dystrophy) (HCC) 10/07/2017   Dilated cardiomyopathy (HCC) 05/02/2017   Upper airway cough syndrome 03/18/2014   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  03/17/2014   Abnormal chest xray 03/16/2014    ONSET DATE: increased issue since recent hx of falling 05/25/22  REFERRING DIAG: G20.A1 (ICD-10-CM) - Parkinson disease G71.00 (ICD-10-CM) - Muscular dystrophy, unspecified S32.000A (ICD-10-CM) - Compression of lumbar vertebra (HCC)  THERAPY DIAG:  Muscle weakness (generalized)  Other abnormalities of gait and mobility  Unsteadiness on feet  Difficulty in walking, not elsewhere classified  Other symptoms and signs involving the musculoskeletal system  Rationale for Evaluation and Treatment: Rehabilitation  SUBJECTIVE:  SUBJECTIVE STATEMENT: Having increased right shoulder pain and anterior right thigh pain  Pt accompanied by: significant other  PERTINENT HISTORY: 67 y.o. male with medical history significant of Parkinson's disease on Sinement, Chronic severe neuropathic pain, FSHD Muscular dystrophy followed by Dr Baron Sane at [facility] clinic, chronic debility uses walker who was hospitalized with fall from standing after which he has had increase pain in his lumbar region. Dx of L3 compression fx, R middle finger fx, s/p kyphoplasty 05/31/22, pt was d/c from rehab on 06/12/22        PAIN:  Are you having pain? Yes: NPRS scale: 9/10 Pain location: right shoulder, low back, right shoulder Pain description: intense  Aggravating factors: staying still Relieving factors: movement  PRECAUTIONS: Fall  WEIGHT BEARING RESTRICTIONS: No  FALLS: Has patient fallen in last 6 months? Yes. Number of falls  2  LIVING ENVIRONMENT: Lives with: lives with their spouse Lives in: House/apartment Stairs: Has a ramp and a lift.  Has following equipment at home: Dan Humphreys - 2 wheeled, Wheelchair (power), and Lift seat, Lift chair, in the process of trying to get a hospital bed.  PLOF: Independent with household mobility with device, Needs assistance with ADLs, Needs assistance with homemaking, and Needs assistance with gait Pt reports he is typically able to use his lift chair and walk to/from bathroom.  Has elevating toilet LIFT.   PATIENT GOALS: continue aquatic therapy, improve standing posture/tolerance  OBJECTIVE:   TODAY'S TREATMENT: 02/04/23 Activity Comments                       TODAY'S TREATMENT: 01/20/23 Activity Comments  Supine lumbar/LE stretching ROM 3x60 sec sustained holds for SKTC, DKTC, piriformis, hamstring, LTR, gastroc to reduce back pain and reduce degree of LE contracture  Gait training W/ CGA-min A level surfaces w/ RW x 25-35 ft increments to maintain level of ambulation for household distances from chair to bathroom to reduce level of assistance                  PATIENT EDUCATION: Education details: discussion of continued POC details  Person educated: Patient and Spouse Education method: Explanation Education comprehension: verbalized understanding  HOME EXERCISE PROGRAM: Access Code: [email or web address] URL: [email or web address] Date: 08/20/2022 Prepared by: Shary Decamp  Exercises - Seated Hamstring Curls with Resistance  - 1 x daily - 7 x weekly - 3 sets - 5-8 reps - Seated Hip Abduction with Resistance  - 1 x daily - 7 x weekly - 3 sets - 10 reps  Manual Muscle Testing: assessed in sitting  Hip flexion: 3+/5 bilat Hip ext: 3-/5 Hip adduction: 2/5 Hip abduction: 4-/5 Knee extension: 4-/5 Knee flexion: 3-/5 Ankle dorsiflexion: 2-/5 Ankle plantarflexion: not assessed by good muscle bulk palpated and able to complete full ROM in  sitting  GOALS: Goals reviewed with patient? Yes  SHORT TERM GOALS: Target date: 01/14/2023      Pt will be independent with initial HEP with spouse's supervision in order to maintain BLE strength to improve functional mobility Baseline: Goal status: MET   2.  Pt will perform sit <> stands with mod A in order to demo improved functional mobility for transfers Baseline: max A Goal status: IN PROGRESS   3.  Pt will perform stand pivot transfer with RW with mod A in order to demo improved functional transfers for home.  Baseline: sit-stand max A, pivoting w/ CGA-min A Goal status: GOAL MET   4.  Pt will perform static  standing w/ upright posture x 30 sec intervals with UE support to improve social interaction and environmental scanning Baseline: unable; with hips against counter/table Goal status: MET  LONG TERM GOALS: Target date: 02/04/2023     Pt will be independent with final HEP with spouse's supervision in order to build upon functional gains made in therapy (which may include aquatic intervention) Baseline:  Goal status: IN PROGRESS   2.   Pt will perform sit <> stands with min A in order to demo improved functional mobility for transfers and decreased caregiver burden. Baseline: Max A Goal status: IN PROGRESS   3.  Pt will ambulate at least 100' with RW and supervision/min guard in order to demo improved household mobility.  Baseline: 53 ft CGA; 60 ft CGA Goal status: REVISED   4.  Pt will perform bed mobility with min assist in order to improve functional mobility/decr caregiver burden.  Baseline: mod A Goal status: IN PROGRESS   ASSESSMENT:  CLINICAL IMPRESSION: Continued with functional maintenance POC with emphasis on lower body and lumbar stretching to decrease level of back pain to improve functional activity tolerance reporting decrease from baseline 9/10 lumbar pain to 7-8/10 after stretching/ROM.  Decrease in gait tolerance noted today requiring seated rest  periods Q 25-35 ft increments.  Continued sessions for maintenance indicated to prevent further decline and sequelae and contracture.  Max A needed for sit to stand from elevated seat surfaces and pivot transfers with CGA-min A for safety  OBJECTIVE IMPAIRMENTS: Abnormal gait, decreased activity tolerance, decreased balance, decreased coordination, decreased endurance, decreased mobility, difficulty walking, decreased strength, impaired flexibility, impaired tone, postural dysfunction, and pain.   ACTIVITY LIMITATIONS: lifting, sitting, standing, squatting, transfers, bed mobility, bathing, toileting, dressing, reach over head, and locomotion level  PARTICIPATION LIMITATIONS:  self-care and reduced ability to ambulate household distances per PLOF  PERSONAL FACTORS: Past/current experiences, Time since onset of injury/illness/exacerbation, and 3+ comorbidities: PD, MD, lumbar fx  are also affecting patient's functional outcome.   REHAB POTENTIAL: Good  CLINICAL DECISION MAKING: Evolving/moderate complexity  EVALUATION COMPLEXITY: Moderate  PLAN:  PT FREQUENCY: 1x/week  PT DURATION: 6 weeks  PLANNED INTERVENTIONS: Therapeutic exercises, Therapeutic activity, Neuromuscular re-education, Balance training, Gait training, Patient/Family education, Self Care, Joint mobilization, Stair training, Vestibular training, Canalith repositioning, Orthotic/Fit training, DME instructions, Aquatic Therapy, Dry Needling, Electrical stimulation, Wheelchair mobility training, Cryotherapy, Moist heat, Taping, and Manual therapy  PLAN FOR NEXT SESSION: continue with skilled maintenance to prevent sequelae    4:16 PM, 02/04/23 M. Shary Decamp, PT, DPT Physical Therapist- [place] Office Number: [phone]

## 2023-02-13 ENCOUNTER — Telehealth: Payer: Self-pay

## 2023-02-13 NOTE — Telephone Encounter (Signed)
(  02/11/23 @12pm ): PC SW returned spouse TC to [facility]. Spouse inquired about PC protocol and the steps she should take in the event patient passes away at home and is not on hospice services. All questions answered and addressed. Spouse had no other concerns or PC at this time.

## 2023-02-17 ENCOUNTER — Ambulatory Visit: Payer: Medicare Other

## 2023-02-17 DIAGNOSIS — R262 Difficulty in walking, not elsewhere classified: Secondary | ICD-10-CM

## 2023-02-17 DIAGNOSIS — R2681 Unsteadiness on feet: Secondary | ICD-10-CM

## 2023-02-17 DIAGNOSIS — M6281 Muscle weakness (generalized): Secondary | ICD-10-CM | POA: Diagnosis not present

## 2023-02-17 DIAGNOSIS — R2689 Other abnormalities of gait and mobility: Secondary | ICD-10-CM

## 2023-02-17 DIAGNOSIS — R29898 Other symptoms and signs involving the musculoskeletal system: Secondary | ICD-10-CM

## 2023-02-17 DIAGNOSIS — G7102 Facioscapulohumeral muscular dystrophy: Secondary | ICD-10-CM

## 2023-02-17 NOTE — Therapy (Signed)
OUTPATIENT PHYSICAL TREATMENT    Patient Name: Luis [name]. MRN: [id number] DOB:Jan 18, 1956, 67 y.o., male Today's Date: 02/17/2023   PCP: Emilio Aspen, MD REFERRING PROVIDER: Charlton Amor, PA-C, POC will be deferred to Dr. Orson Aloe as referring provider does not have any further contact with patient.    END OF SESSION:  PT End of Session - 02/17/23 1615     Visit Number 14    Number of Visits 19    Date for PT Re-Evaluation 03/19/23    Authorization Type Medicare/medicaid    Progress Note Due on Visit 20    PT Start Time 1615    PT Stop Time 1700    PT Time Calculation (min) 45 min    Equipment Utilized During Treatment Other (comment)   aquatic weights, pool noodles, large bar bell   Activity Tolerance Patient tolerated treatment well    Behavior During Therapy WFL for tasks assessed/performed             Past Medical History:  Diagnosis Date   Bilateral foot-drop 10/19/2018   Bursitis, trochanteric    Episodic   Carpal tunnel syndrome on right    Degenerative disk disease    l5-S1   FSH (facioscapulohumeral muscular dystrophy) (HCC) 10/07/2017   Gait abnormality 10/19/2018   GERD (gastroesophageal reflux disease)    Hemorrhoids    with anal fissures   Left lateral epicondylitis    Parkinson's disease 10/19/2018   Skin cancer    right temple area   Past Surgical History:  Procedure Laterality Date   CHOLECYSTECTOMY  2007   HEMORRHOIDECTOMY WITH HEMORRHOID BANDING     IR KYPHO LUMBAR INC FX REDUCE BONE BX UNI/BIL CANNULATION INC/IMAGING  01/10/2020   IR KYPHO LUMBAR INC FX REDUCE BONE BX UNI/BIL CANNULATION INC/IMAGING  05/31/2022   Patient Active Problem List   Diagnosis Date Noted   Spinal stenosis of lumbar region with neurogenic claudication 09/18/2022   Low serum albumin    Lumbar compression fracture (HCC) 06/04/2022   Compression fracture of lumbar vertebra (HCC) 05/27/2022   Other chronic pain 10/18/2021   Chronic constipation  10/18/2021   Gait abnormality 10/19/2018   Bilateral foot-drop 10/19/2018   Parkinson's disease 10/19/2018   Swelling of lower leg - Right  03/06/2018   Tremor of right hand 12/19/2017   FSH (facioscapulohumeral muscular dystrophy) (HCC) 10/07/2017   Dilated cardiomyopathy (HCC) 05/02/2017   Upper airway cough syndrome 03/18/2014   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  03/17/2014   Abnormal chest xray 03/16/2014    ONSET DATE: increased issue since recent hx of falling 05/25/22  REFERRING DIAG: G20.A1 (ICD-10-CM) - Parkinson disease G71.00 (ICD-10-CM) - Muscular dystrophy, unspecified S32.000A (ICD-10-CM) - Compression of lumbar vertebra (HCC)  THERAPY DIAG:  Muscle weakness (generalized)  Other abnormalities of gait and mobility  Unsteadiness on feet  Difficulty in walking, not elsewhere classified  Other symptoms and signs involving the musculoskeletal system  FSH (facioscapulohumeral muscular dystrophy) (HCC)  Rationale for Evaluation and Treatment: Habilitation//skilled Functional Maintenance Program  SUBJECTIVE:  SUBJECTIVE STATEMENT: Having increased right shoulder pain and anterior right thigh pain. Very frustrated by level of pain and increasing weakness  Pt accompanied by: significant other  PERTINENT HISTORY: 67 y.o. male with medical history significant of Parkinson's disease on Sinement, Chronic severe neuropathic pain, FSHD Muscular dystrophy followed by Dr Baron Sane at [facility] clinic, chronic debility uses walker who was hospitalized with fall from standing after which he has had increase pain in his lumbar region. Dx of L3 compression fx, R middle finger fx, s/p kyphoplasty 05/31/22, pt was d/c from rehab on 06/12/22        PAIN:  Are you having pain? Yes: NPRS scale: 9/10 Pain location: right  shoulder, low back, right shoulder Pain description: intense  Aggravating factors: staying still Relieving factors: movement  PRECAUTIONS: Fall  WEIGHT BEARING RESTRICTIONS: No  FALLS: Has patient fallen in last 6 months? Yes. Number of falls 2  LIVING ENVIRONMENT: Lives with: lives with their spouse Lives in: House/apartment Stairs: Has a ramp and a lift.  Has following equipment at home: Dan Humphreys - 2 wheeled, Wheelchair (power), and Lift seat, Lift chair, in the process of trying to get a hospital bed.  PLOF: Independent with household mobility with device, Needs assistance with ADLs, Needs assistance with homemaking, and Needs assistance with gait Pt reports he is typically able to use his lift chair and walk to/from bathroom.  Has elevating toilet LIFT.   PATIENT GOALS: Maintain ability to ambulate to/from bathroom. Reduce pain  OBJECTIVE:    TODAY'S TREATMENT: 02/17/23 Activity Comments  Transfer training -training to maintain ability to perform sit-stand from elevated w/c height to engage with ability for sit to stand for car transfers and toilet.  Max A for sit-stand and mod A for stand to sit to facilitate eccentric control  Gait training -min A with RW to maintain ability to ambulate 50-75 ft level surfaces with therapist providing extension force for trunk support due to increased difficulty/pain in right shoulder limiting functional use of RW  exercise -pt engaged in submax, multi-angle isometrics BLE hip abd, flexion, knee ext/flexion 3x10 reps to maintain LE strength and reduce atrophy  -passive stretching BLE to reduce contracture and maintain flexibility for ADL/self-care                  PATIENT EDUCATION: Education details: discussion of continued POC details and rationale of maintenance PT services  Person educated: Patient and Spouse Education method: Explanation Education comprehension: verbalized understanding  HOME EXERCISE PROGRAM: Access Code:  [email or web address] URL: [email or web address] Date: 08/20/2022 Prepared by: Shary Decamp  Exercises - Seated Hamstring Curls with Resistance  - 1 x daily - 7 x weekly - 3 sets - 5-8 reps - Seated Hip Abduction with Resistance  - 1 x daily - 7 x weekly - 3 sets - 10 reps  Manual Muscle Testing: assessed in sitting  Hip flexion: 3+/5 bilat Hip ext: 3-/5 Hip adduction: 2/5 Hip abduction: 4-/5 Knee extension: 4-/5 Knee flexion: 3-/5 Ankle dorsiflexion: 2-/5 Ankle plantarflexion: not assessed by good muscle bulk palpated and able to complete full ROM in sitting  GOALS: Goals reviewed with patient? Yes  SHORT TERM GOALS: Target date: see LTG     LONG TERM GOALS: Target date: 03/19/2023       Spouse will be able to demonstrate safety and proficiency with assisting patient in LE PROM and static stretching to maintain joint/limb integrity to reduce further contracture Baseline:  Goal status: IN PROGRESS   2.   Pt  will perform sit <> stands with max A in order to demo maintained functional mobility for transfers and decreased caregiver burden. Baseline: Max A Goal status: IN PROGRESS   3.  Pt will ambulate at least 100' with RW and supervision/min guard in order to maintain ability to ambulate to/from bathroom Baseline: 50 ft w/ CGA-min A Goal status: REVISED   4.  Pt will not exhibit further plantarflexion contracture past -10 degrees to prevent risk for skin breakdown and maintain ability to achieve plantigrade foot position to enable functional transfers Baseline: 10 degree plantarflexion contracture, achieves plantigrade by knee hyperextension Goal status: IN PROGRESS   ASSESSMENT:  CLINICAL IMPRESSION: Pt notes continued right shoulder pain limiting ambulation tolerance due to bearing on RW and demo increased trunk flexion and even resting forehead on cross bar for brief rest periods.  Therapist assist in offloading by providing trunk extension force which did well to  support and afforded ambulation for 50 ft increments with method. Continued with activities for general strength, flexibility, and ROM to maintain joint integrity and reduce contracture risk.  Pt reports some pain relief with exercise activities but notes his baseline of global pain is >10/10.  Continued sessions for maintenance physical therapy to reduce risk for further functional decline and further atrophy.  OBJECTIVE IMPAIRMENTS: Abnormal gait, decreased activity tolerance, decreased balance, decreased coordination, decreased endurance, decreased mobility, difficulty walking, decreased strength, impaired flexibility, impaired tone, postural dysfunction, and pain.   ACTIVITY LIMITATIONS: lifting, sitting, standing, squatting, transfers, bed mobility, bathing, toileting, dressing, reach over head, and locomotion level  PARTICIPATION LIMITATIONS:  self-care and reduced ability to ambulate household distances per PLOF  PERSONAL FACTORS: Past/current experiences, Time since onset of injury/illness/exacerbation, and 3+ comorbidities: PD, MD, lumbar fx  are also affecting patient's functional outcome.   REHAB POTENTIAL: Good  CLINICAL DECISION MAKING: Evolving/moderate complexity  EVALUATION COMPLEXITY: Moderate  PLAN:  PT FREQUENCY: 1x/week  PT DURATION: 6 weeks  PLANNED INTERVENTIONS: Therapeutic exercises, Therapeutic activity, Neuromuscular re-education, Balance training, Gait training, Patient/Family education, Self Care, Joint mobilization, Stair training, Vestibular training, Canalith repositioning, Orthotic/Fit training, DME instructions, Aquatic Therapy, Dry Needling, Electrical stimulation, Wheelchair mobility training, Cryotherapy, Moist heat, Taping, and Manual therapy  PLAN FOR NEXT SESSION: continue with skilled maintenance to prevent sequelae    4:15 PM, 02/17/23 M. Shary Decamp, PT, DPT Physical Therapist- [place] Office Number: [phone]

## 2023-02-24 ENCOUNTER — Ambulatory Visit: Payer: Medicare Other

## 2023-02-24 DIAGNOSIS — M6281 Muscle weakness (generalized): Secondary | ICD-10-CM | POA: Diagnosis not present

## 2023-02-24 DIAGNOSIS — R29898 Other symptoms and signs involving the musculoskeletal system: Secondary | ICD-10-CM

## 2023-02-24 DIAGNOSIS — R2681 Unsteadiness on feet: Secondary | ICD-10-CM

## 2023-02-24 DIAGNOSIS — R262 Difficulty in walking, not elsewhere classified: Secondary | ICD-10-CM

## 2023-02-24 DIAGNOSIS — G7102 Facioscapulohumeral muscular dystrophy: Secondary | ICD-10-CM

## 2023-02-24 DIAGNOSIS — R2689 Other abnormalities of gait and mobility: Secondary | ICD-10-CM

## 2023-02-24 NOTE — Therapy (Unsigned)
OUTPATIENT PHYSICAL TREATMENT    Patient Name: Luis [name]. MRN: [id number] DOB:12-Jan-1956, 67 y.o., male Today's Date: 02/24/2023   PCP: Emilio Aspen, MD REFERRING PROVIDER: Charlton Amor, PA-C, POC will be deferred to Dr. Orson Aloe as referring provider does not have any further contact with patient.    END OF SESSION:  PT End of Session - 02/24/23 1635     Visit Number 15    Number of Visits 19    Date for PT Re-Evaluation 03/19/23    Authorization Type Medicare/medicaid    Progress Note Due on Visit 20    PT Start Time 1615    PT Stop Time 1700    PT Time Calculation (min) 45 min    Equipment Utilized During Treatment Other (comment)   aquatic weights, pool noodles, large bar bell   Activity Tolerance Patient tolerated treatment well    Behavior During Therapy WFL for tasks assessed/performed             Past Medical History:  Diagnosis Date   Bilateral foot-drop 10/19/2018   Bursitis, trochanteric    Episodic   Carpal tunnel syndrome on right    Degenerative disk disease    l5-S1   FSH (facioscapulohumeral muscular dystrophy) (HCC) 10/07/2017   Gait abnormality 10/19/2018   GERD (gastroesophageal reflux disease)    Hemorrhoids    with anal fissures   Left lateral epicondylitis    Parkinson's disease 10/19/2018   Skin cancer    right temple area   Past Surgical History:  Procedure Laterality Date   CHOLECYSTECTOMY  2007   HEMORRHOIDECTOMY WITH HEMORRHOID BANDING     IR KYPHO LUMBAR INC FX REDUCE BONE BX UNI/BIL CANNULATION INC/IMAGING  01/10/2020   IR KYPHO LUMBAR INC FX REDUCE BONE BX UNI/BIL CANNULATION INC/IMAGING  05/31/2022   Patient Active Problem List   Diagnosis Date Noted   Spinal stenosis of lumbar region with neurogenic claudication 09/18/2022   Low serum albumin    Lumbar compression fracture (HCC) 06/04/2022   Compression fracture of lumbar vertebra (HCC) 05/27/2022   Other chronic pain 10/18/2021   Chronic constipation  10/18/2021   Gait abnormality 10/19/2018   Bilateral foot-drop 10/19/2018   Parkinson's disease 10/19/2018   Swelling of lower leg - Right  03/06/2018   Tremor of right hand 12/19/2017   FSH (facioscapulohumeral muscular dystrophy) (HCC) 10/07/2017   Dilated cardiomyopathy (HCC) 05/02/2017   Upper airway cough syndrome 03/18/2014   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  03/17/2014   Abnormal chest xray 03/16/2014    ONSET DATE: increased issue since recent hx of falling 05/25/22  REFERRING DIAG: G20.A1 (ICD-10-CM) - Parkinson disease G71.00 (ICD-10-CM) - Muscular dystrophy, unspecified S32.000A (ICD-10-CM) - Compression of lumbar vertebra (HCC)  THERAPY DIAG:  Muscle weakness (generalized)  Other abnormalities of gait and mobility  Unsteadiness on feet  Difficulty in walking, not elsewhere classified  Other symptoms and signs involving the musculoskeletal system  FSH (facioscapulohumeral muscular dystrophy) (HCC)  Rationale for Evaluation and Treatment: Habilitation//skilled Functional Maintenance Program  SUBJECTIVE:  SUBJECTIVE STATEMENT: Right shoulder pain limiting standing/walking tolerance with RW, decreased ability to stand  Pt accompanied by: significant other  PERTINENT HISTORY: 67 y.o. male with medical history significant of Parkinson's disease on S[place], Chronic severe neuropathic pain, FSHD Muscular dystrophy followed by Dr Baron Sane at [facility] clinic, chronic debility uses walker who was hospitalized with fall from standing after which he has had increase pain in his lumbar region. Dx of L3 compression fx, R middle finger fx, s/p kyphoplasty 05/31/22, pt was d/c from rehab on 06/12/22        PAIN:  Are you having pain? Yes: NPRS scale: 9/10 Pain location: right shoulder, low back, right  shoulder Pain description: intense  Aggravating factors: staying still Relieving factors: movement  PRECAUTIONS: Fall  WEIGHT BEARING RESTRICTIONS: No  FALLS: Has patient fallen in last 6 months? Yes. Number of falls 2  LIVING ENVIRONMENT: Lives with: lives with their spouse Lives in: House/apartment Stairs: Has a ramp and a lift.  Has following equipment at home: Dan Humphreys - 2 wheeled, Wheelchair (power), and Lift seat, Lift chair, in the process of trying to get a hospital bed.  PLOF: Independent with household mobility with device, Needs assistance with ADLs, Needs assistance with homemaking, and Needs assistance with gait Pt reports he is typically able to use his lift chair and walk to/from bathroom.  Has elevating toilet LIFT.   PATIENT GOALS: Maintain ability to ambulate to/from bathroom. Reduce pain  OBJECTIVE:    TODAY'S TREATMENT: 02/24/23 Activity Comments  Transfer training/thera act Trials in sit-stand from w/c height max A -static standing to improve tolerance for ADL using modified RW 4x75 sec trials -bed mobility training min-mod A for sit to supine; mod-max A for supine to sit due to LBP -AAROM BLE 3x12 reps to maintain joint integrity/reduce contracture -static stretching BLE 3x60 sec all major mm groups to maintain flexibility/prevent worse ankle contracture---MHP applied to right shoulder during supine activities  Gait training W/ RW and min A for trunk support into extension to maintain ambulation level surfaces of home                     PATIENT EDUCATION: Education details: discussion of continued POC details and rationale of maintenance PT services  Person educated: Patient and Spouse Education method: Explanation Education comprehension: verbalized understanding  HOME EXERCISE PROGRAM: Access Code: [email or web address] URL: [email or web address] Date: 08/20/2022 Prepared by: Shary Decamp  Exercises - Seated Hamstring Curls with Resistance   - 1 x daily - 7 x weekly - 3 sets - 5-8 reps - Seated Hip Abduction with Resistance  - 1 x daily - 7 x weekly - 3 sets - 10 reps  Manual Muscle Testing: assessed in sitting  Hip flexion: 3+/5 bilat Hip ext: 3-/5 Hip adduction: 2/5 Hip abduction: 4-/5 Knee extension: 4-/5 Knee flexion: 3-/5 Ankle dorsiflexion: 2-/5 Ankle plantarflexion: not assessed by good muscle bulk palpated and able to complete full ROM in sitting  GOALS: Goals reviewed with patient? Yes  SHORT TERM GOALS: Target date: see LTG     LONG TERM GOALS: Target date: 03/19/2023       Spouse will be able to demonstrate safety and proficiency with assisting patient in LE PROM and static stretching to maintain joint/limb integrity to reduce further contracture Baseline:  Goal status: IN PROGRESS   2.   Pt will perform sit <> stands with max A in order to demo maintained functional mobility for transfers and decreased caregiver burden. Baseline: Max A  Goal status: IN PROGRESS   3.  Pt will ambulate at least 100' with RW and supervision/min guard in order to maintain ability to ambulate to/from bathroom Baseline: 50 ft w/ CGA-min A Goal status: REVISED   4.  Pt will not exhibit further plantarflexion contracture past -10 degrees to prevent risk for skin breakdown and maintain ability to achieve plantigrade foot position to enable functional transfers Baseline: 10 degree plantarflexion contracture, achieves plantigrade by knee hyperextension Goal status: IN PROGRESS   ASSESSMENT:  CLINICAL IMPRESSION: Pt notes continued right shoulder pain limiting ambulation tolerance due to bearing on RW and demo increased trunk flexion as result, gait training w/ min A to assist in trunk extension in order to maintain ambulation tolerance.  Trials in standing w/ modified RW to provide for chest support with pt reporting decr in shoulder and LBP increasing standing tolerance to 75 sec intervals.  Session ending with mobility and  assisted in stretching and ROM to reduce contracture risk and prevent plantarflexion contracture from worsening.  Unfortunately continues to be limited by generalized weakness and increase in right shoulder pain.  OBJECTIVE IMPAIRMENTS: Abnormal gait, decreased activity tolerance, decreased balance, decreased coordination, decreased endurance, decreased mobility, difficulty walking, decreased strength, impaired flexibility, impaired tone, postural dysfunction, and pain.   ACTIVITY LIMITATIONS: lifting, sitting, standing, squatting, transfers, bed mobility, bathing, toileting, dressing, reach over head, and locomotion level  PARTICIPATION LIMITATIONS:  self-care and reduced ability to ambulate household distances per PLOF  PERSONAL FACTORS: Past/current experiences, Time since onset of injury/illness/exacerbation, and 3+ comorbidities: PD, MD, lumbar fx  are also affecting patient's functional outcome.   REHAB POTENTIAL: Good  CLINICAL DECISION MAKING: Evolving/moderate complexity  EVALUATION COMPLEXITY: Moderate  PLAN:  PT FREQUENCY: 1x/week  PT DURATION: 6 weeks  PLANNED INTERVENTIONS: Therapeutic exercises, Therapeutic activity, Neuromuscular re-education, Balance training, Gait training, Patient/Family education, Self Care, Joint mobilization, Stair training, Vestibular training, Canalith repositioning, Orthotic/Fit training, DME instructions, Aquatic Therapy, Dry Needling, Electrical stimulation, Wheelchair mobility training, Cryotherapy, Moist heat, Taping, and Manual therapy  PLAN FOR NEXT SESSION: continue with skilled maintenance to prevent sequelae    4:35 PM, 02/24/23 M. Shary Decamp, PT, DPT Physical Therapist- [place] Office Number: [phone]

## 2023-02-26 ENCOUNTER — Other Ambulatory Visit: Payer: Medicare Other

## 2023-02-28 ENCOUNTER — Encounter: Payer: Self-pay | Admitting: Physical Medicine and Rehabilitation

## 2023-02-28 ENCOUNTER — Encounter: Payer: Medicare Other | Attending: Physical Medicine & Rehabilitation | Admitting: Physical Medicine and Rehabilitation

## 2023-02-28 VITALS — BP 111/72 | HR 86 | Ht 71.0 in | Wt 150.0 lb

## 2023-02-28 DIAGNOSIS — M25511 Pain in right shoulder: Secondary | ICD-10-CM | POA: Diagnosis not present

## 2023-02-28 DIAGNOSIS — M7918 Myalgia, other site: Secondary | ICD-10-CM | POA: Insufficient documentation

## 2023-02-28 DIAGNOSIS — S32030S Wedge compression fracture of third lumbar vertebra, sequela: Secondary | ICD-10-CM | POA: Insufficient documentation

## 2023-02-28 DIAGNOSIS — M21372 Foot drop, left foot: Secondary | ICD-10-CM | POA: Insufficient documentation

## 2023-02-28 DIAGNOSIS — R269 Unspecified abnormalities of gait and mobility: Secondary | ICD-10-CM | POA: Insufficient documentation

## 2023-02-28 DIAGNOSIS — G8929 Other chronic pain: Secondary | ICD-10-CM | POA: Insufficient documentation

## 2023-02-28 DIAGNOSIS — G7102 Facioscapulohumeral muscular dystrophy: Secondary | ICD-10-CM | POA: Insufficient documentation

## 2023-02-28 DIAGNOSIS — M21371 Foot drop, right foot: Secondary | ICD-10-CM | POA: Diagnosis not present

## 2023-02-28 MED ORDER — BETAMETHASONE SOD PHOS & ACET 6 (3-3) MG/ML IJ SUSP
12.0000 mg | Freq: Once | INTRAMUSCULAR | Status: AC
Start: 2023-02-28 — End: 2023-02-28
  Administered 2023-02-28: 12 mg via INTRAMUSCULAR

## 2023-02-28 MED ORDER — LIDOCAINE HCL 1 % IJ SOLN
7.0000 mL | Freq: Once | INTRAMUSCULAR | Status: AC
Start: 2023-02-28 — End: 2023-02-28
  Administered 2023-02-28: 7 mL

## 2023-02-28 NOTE — Patient Instructions (Signed)
Plan: Let pt- know, can do steroid injection Celestone-  1cc Celestone and 1 cc 1% Lidocaine.  steroid injection was performed at R RTC injection with Celestone using 1% plain Lidocaine and 1cc Celestone. This was well tolerated.  Cleaned with betadine x3 and allowed to dry- then alcohol then injected using 27 gauge 1.5 inch needle- no bleeding or complications.    F/U in 3 months for steroid injections of R shoulder  Lidocaine will kick in 15 minutes- and wear off tonight- the steroid will kick in tomorrow within 24 hours and take up to 72 hours to fully kick in.  2. Con't Gabapentin, per Neuro;   3. Weakness in legs is from [facility], not meds.    4. Will stop Cymbalta-keep off it-  since causing dizziness.  Doesn't feel depressed, "overly positive" per wife.  -if muscle pain/joint pain worse in next 2 weeks, can restart 30 mg nightly.   5. Patient here for trigger point injections for  Consent done and on chart.  Cleaned areas with alcohol and injected using a 27 gauge 1.5 inch needle  Injected 6 cc- none wasted Using 1% Lidocaine with no EPI  Upper traps B/L x2 Levators- B/L  Posterior scalenes Middle scalenes- B/L  Splenius Capitus- B/L  Pectoralis Major- B/L  Rhomboids- B/L x2 Infraspinatus- B/L x3 Teres Major/minor Thoracic paraspinals- B/L  Lumbar paraspinals Other injections-    Patient's level of pain prior was 10/10- (50/10) Current level of pain after injections is down to 7-8/10 in after doing injections.   There was no bleeding or complications.  Patient was advised to drink a lot of water on day after injections to flush system Will have increased soreness for 12-48 hours after injections.  Can use Lidocaine patches the day AFTER injections Can use theracane on day of injections in places didn't inject Can use heating pad 4-6 hours AFTER injections   6. F/U in 6 weeks. TrP injections.

## 2023-02-28 NOTE — Progress Notes (Signed)
Pt is a 67 yr old male with hx of FSH muscular dystrophy- dx'd in 1992- - also has Parkinson's disease, dilated cardiomyopathy- EF 41%; compression fx s/p fall s/p kyphoplasty at L5; also has disc protrusion Left L5/S1 and L disc protrusion at Left L1-2.   Here for f/u on his FSH muscular dystrophy.  And trigger point injections. And Parkinson's diease. And chronic pain.          Gets Dilaudid 10 mg  3-4x/day - gets from PCP.  Gets 4th dose at 2am or so.    Walking really cockeyed because elbow-  And cannot use R shoulder much anymore.  Was taking, but stopped 1-2 weeks ago.   Shoulder pain hasn't gotten any different since stopped Cymbalta.  Legs so weak in AM-  But gives intermittently if cramping.      Has also had fall- needed fire dept to get him off floor. Only 1 fall- didn't get hurt.   R shoulder is much more painful than normal and almost lost ability to R arm lately due to pain.   Hard when takes Cymbalta- gets dizzy- and poor energy- was taking  BID and then now taking at bedtime-   Getting GERD Sx's as well-    Plan: Let pt- know, can do steroid injection Celestone-  1cc Celestone and 1 cc 1% Lidocaine.  steroid injection was performed at R RTC injection with Celestone using 1% plain Lidocaine and 1cc Celestone. This was well tolerated.  Cleaned with betadine x3 and allowed to dry- then alcohol then injected using 27 gauge 1.5 inch needle- no bleeding or complications.    F/U in 3 months for steroid injections of R shoulder  Lidocaine will kick in 15 minutes- and wear off tonight- the steroid will kick in tomorrow within 24 hours and take up to 72 hours to fully kick in.  2. Con't Gabapentin, per Neuro;   3. Weakness in legs is from [facility], not meds.    4. Will stop Cymbalta-keep off it-  since causing dizziness.  Doesn't feel depressed, "overly positive" per wife.  -if muscle pain/joint pain worse in next 2 weeks, can restart 30 mg nightly.   5. Patient  here for trigger point injections for  Consent done and on chart.  Cleaned areas with alcohol and injected using a 27 gauge 1.5 inch needle  Injected 6 cc- none wasted Using 1% Lidocaine with no EPI  Upper traps B/L x2 Levators- B/L  Posterior scalenes Middle scalenes- B/L  Splenius Capitus- B/L  Pectoralis Major- B/L  Rhomboids- B/L x2 Infraspinatus- B/L x3 Teres Major/minor Thoracic paraspinals- B/L  Lumbar paraspinals Other injections-    Patient's level of pain prior was 10/10- (50/10) Current level of pain after injections is down to 7-8/10 in after doing injections.   There was no bleeding or complications.  Patient was advised to drink a lot of water on day after injections to flush system Will have increased soreness for 12-48 hours after injections.  Can use Lidocaine patches the day AFTER injections Can use theracane on day of injections in places didn't inject Can use heating pad 4-6 hours AFTER injections   6. F/U in 6 weeks. TrP injections.    I spent a total of 38   minutes on total care today- >50% coordination of care- due to  5 minutes R shoulder and  8 minutes on injections. Rest on discussing pt's progression and chronic pain.

## 2023-03-05 NOTE — Therapy (Signed)
[facility] [address], [address] [place], [place], [address] Phone: [phone]   Fax:  [phone]  Patient Details  Name: Luis Dickow Hornig Jr. MRN: [id number] Date of Birth: Nov 05, 1955 Referring Provider:  No ref. provider found  Encounter Date: 03/05/2023  PHYSICAL THERAPY DISCHARGE SUMMARY  Visits from Start of Care: 15  Current functional level related to goals / functional outcomes: Pt requiring max A for sit to stand transfers; max A bed mobility; min A-CGA for ambulation on level surfaces using RW.  Goals of therapeutic intervention being functional maintenance program to reduce deficits and limitations of disease process to maintain current level of function   Remaining deficits: Functional mobility deficits/impairments. Requires physical assistance in bed mobility, transfers, and gait   Education / Equipment: HEP and caregiver instruction in ROM/positioning strategies to minimize further contracture of ankles and education in relevant home mobility devices   Patient agrees to discharge. Patient goals were partially met. Patient is being discharged due to the patient's request.--pt and spouse request hold at this time for self-management.  Will f/u as needed.   Dion Body, PT 03/05/2023, 9:58 AM  [place]  [facility] [address] [place], [place], [address] Phone: [phone]   Fax:  [phone]

## 2023-03-25 ENCOUNTER — Other Ambulatory Visit: Payer: Medicare Other

## 2023-04-11 ENCOUNTER — Encounter: Payer: Medicare Other | Attending: Physical Medicine & Rehabilitation | Admitting: Physical Medicine and Rehabilitation

## 2023-04-11 ENCOUNTER — Encounter: Payer: Self-pay | Admitting: Physical Medicine and Rehabilitation

## 2023-04-11 VITALS — BP 110/65 | HR 56 | Ht 71.0 in | Wt 150.0 lb

## 2023-04-11 DIAGNOSIS — M21371 Foot drop, right foot: Secondary | ICD-10-CM

## 2023-04-11 DIAGNOSIS — M21372 Foot drop, left foot: Secondary | ICD-10-CM | POA: Insufficient documentation

## 2023-04-11 DIAGNOSIS — G7102 Facioscapulohumeral muscular dystrophy: Secondary | ICD-10-CM

## 2023-04-11 DIAGNOSIS — M7918 Myalgia, other site: Secondary | ICD-10-CM

## 2023-04-11 MED ORDER — LIDOCAINE HCL 1 % IJ SOLN
4.5000 mL | Freq: Once | INTRAMUSCULAR | Status: AC
Start: 2023-04-11 — End: 2023-04-11
  Administered 2023-04-11: 4.5 mL

## 2023-04-11 NOTE — Progress Notes (Signed)
Pt is a 67 yr old male with hx of FSH muscular dystrophy- dx'd in 1992- - also has Parkinson's disease, dilated cardiomyopathy- EF 41%; compression fx s/p fall s/p kyphoplasty at L5; also has disc protrusion Left L5/S1 and L disc protrusion at Left L1-2.   Here for f/u on his FSH muscular dystrophy.  And trigger point injections. And Parkinson's diease. And chronic pain.     Things going ok- things "stable"- best can be.  Weakness is progressing- sees Dr Blain Pais next week Tuesday.     Last sets of shots really helpful.  Pain starting coming back ~3-4 days ago- but not constant.  Catching it earlier to come see me today.    Last got  injections of trigger points as well as R shoulder-   Didn't notice any change in pain when stopped the Cymbalta.     Plan:  Patient here for trigger point injections for FSH muscle tightness  Consent done and on chart.  Cleaned areas with alcohol and injected using a 27 gauge 1.5 inch needle  Injected  4.5cc- 1.5 wasted Using 1% Lidocaine with no EPI  Upper traps B/L  Levators- B/L  Posterior scalenes Middle scalenes- B/L  Splenius Capitus- B/L  Pectoralis Major- B/L - and R deltoid Rhomboids B/L x2 Infraspinatus Teres Major/minor Thoracic paraspinals Lumbar paraspinals Other injections-    Patient's level of pain prior was  7/10 Current level of pain after injections is- 2-3/10  There was no bleeding or complications.  Patient was advised to drink a lot of water on day after injections to flush system Will have increased soreness for 12-48 hours after injections.  Can use Lidocaine patches the day AFTER injections Can use theracane on day of injections in places didn't inject Can use heating pad 4-6 hours AFTER injections   2. Next appt, R shoulder injections.    3. F/U in 6 weeks for Trp injections/R shoulder celestone injections.

## 2023-04-11 NOTE — Addendum Note (Signed)
Addended by: Janean Sark on: 04/11/2023 04:08 PM   Modules accepted: Orders

## 2023-04-11 NOTE — Patient Instructions (Signed)
Plan:  Patient here for trigger point injections for FSH muscle tightness  Consent done and on chart.  Cleaned areas with alcohol and injected using a 27 gauge 1.5 inch needle  Injected  4.5cc- 1.5 wasted Using 1% Lidocaine with no EPI  Upper traps B/L  Levators- B/L  Posterior scalenes Middle scalenes- B/L  Splenius Capitus- B/L  Pectoralis Major- B/L - and R deltoid Rhomboids B/L x2 Infraspinatus Teres Major/minor Thoracic paraspinals Lumbar paraspinals Other injections-    Patient's level of pain prior was  7/10 Current level of pain after injections is- 2-3/10  There was no bleeding or complications.  Patient was advised to drink a lot of water on day after injections to flush system Will have increased soreness for 12-48 hours after injections.  Can use Lidocaine patches the day AFTER injections Can use theracane on day of injections in places didn't inject Can use heating pad 4-6 hours AFTER injections   2. Next appt, R shoulder injections.    3. F/U in 6 weeks for Trp injections/R shoulder celestone injections.

## 2023-04-18 ENCOUNTER — Ambulatory Visit: Payer: Medicare Other | Admitting: Podiatry

## 2023-05-15 ENCOUNTER — Encounter: Payer: Self-pay | Admitting: Neurology

## 2023-05-15 ENCOUNTER — Ambulatory Visit (INDEPENDENT_AMBULATORY_CARE_PROVIDER_SITE_OTHER): Payer: Medicare Other | Admitting: Neurology

## 2023-05-15 VITALS — BP 111/69 | HR 57 | Resp 14 | Ht 71.0 in

## 2023-05-15 DIAGNOSIS — G7102 Facioscapulohumeral muscular dystrophy: Secondary | ICD-10-CM | POA: Diagnosis not present

## 2023-05-15 DIAGNOSIS — G20B1 Parkinson's disease with dyskinesia, without mention of fluctuations: Secondary | ICD-10-CM

## 2023-05-15 DIAGNOSIS — S32030S Wedge compression fracture of third lumbar vertebra, sequela: Secondary | ICD-10-CM

## 2023-05-15 DIAGNOSIS — R269 Unspecified abnormalities of gait and mobility: Secondary | ICD-10-CM

## 2023-05-15 MED ORDER — CARBIDOPA-LEVODOPA ER 50-200 MG PO TBCR: 1.0000 | EXTENDED_RELEASE_TABLET | Freq: Three times a day (TID) | ORAL | 4 refills | Status: DC

## 2023-05-15 MED ORDER — GABAPENTIN 600 MG PO TABS: 600.0000 mg | ORAL_TABLET | Freq: Four times a day (QID) | ORAL | 4 refills | Status: DC

## 2023-05-15 NOTE — Progress Notes (Signed)
Chief Complaint  Patient presents with   Follow-up    Rm14, wife present, FSH, Parkinson's disease: unable to walk, toe curling, balance issues 40 degree angle on walker      ASSESSMENT AND PLAN  Luis [name] Mori Montez Hageman. is a 67 y.o. male   Parkinson's disease  Has been followed by Dr. Anne Hahn in the past, was taking immediate release Sinemet 25/100 mg tablets, but in September 2020 he developed dizziness, lightheadedness, after dosing of immediate release of Sinemet, has been changed to CR, on slow titrating dose, was changed to Sinemet 50/200 CR 1 tablet 3 times a day since September 2022, seems to tolerated better. Keep Sinemet CR 50/200 mg 1 tablet 3 times a day  Chronic severe neuropathic pain and worsening low back pain following his L3 lumbar compression fracture, kyphoplasty  He is also getting Dilaudid 8 mg 4 times a day from his primary care physician Dr. Kevan Ny, planning on to higher dose 10 mg 4 times a day   May keep on current dose of gabapentin 600 mg 3 times a day for baseline, if needed, may take extra, with maximum daily dose of 3600 mg daily   Genetically confirmed FSHD, followed up by Dr. 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 Workup included NCV/EMG and muscle biopsy. 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    DIAGNOSTIC DATA (LABS, IMAGING, TESTING) - I reviewed patient records, labs, notes, testing and imaging myself where available.   MEDICAL HISTORY:  Luis [name] Montez Hageman., is a 67 year old male, accompanied by his wife, to follow-up for Parkinson's disease, FSH muscular dystrophy, his primary care physician is Dr. 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 He was clinically diagnosed with FSHD in 35 (67 yo) at [facility]. Workup included NCV/EMG and muscle biopsy. 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  He is followed up by Dr. Baron Sane at [facility] clinic every 6 months  He also had a gradual onset right hand tremor, was diagnosed with Parkinson's disease by Dr. Anne Hahn, he was on regular form of Sinemet 25/100 mg in the past, but developed significant dizziness after each dose, has been switched to current Sinemet CR 50/200 mg 3 times daily, taking it at 8 AM, 4 PM, and bedtime  In addition, he suffered a significant joints, diffuse muscle achy pain, is getting Dilaudid 8 mg 4 times a day from his primary care physician Dr. Marden Noble  Patient and his wife strive to keep him active, their daily routine involving frequent walking short distances walker, standing against the wall,  He has began palliative care who comes to their home. Per the patient this is to decrease the number of visits he has to travel to.  He has continued to have significant pain and weakness of his low back and LLE since he underwent a nerve ablation at L3-4 performed October 2021.   For mobility, he is continuing to use a walker all the time. He also  has a PWC at home, which they report they are working with [employer] to continue to adjust the seat for comfort.   He is using Trilogy machine is working well. He continues to have some shortness of breath, especially with activity. He wears his Trilogy every night. Will wear it rarely during the day when taking a nap or when symptomatic.   UPDATE August 17th 2023: He is accompanied by his wife at today's visit,  continues see [facility] by Dr. Blain Pais every 6 months, Dr. Kevan Ny recently started him on Cymbalta 30 mg bid , it seems to help his pain better  Complains of increased fatigue stiffness since last visit, but there was over all no significant change, he continue to try his best exercise daily, in standing position,  UPDATE November 11 2022: He came in wheelchair, accompanied by his wife at today's visit, he suffered a fall in September, with increased low back pain, leading to hospital admission,  MRI of lumbar May 29, 2022 showed acute compression fracture of L3 with 40% vertebral height loss, compression deformity of L5, status post kyphoplasty, He underwent fluoroscopy guided bilateral transpedicle L3 vertebral body balloon kyphoplasty for osteoporotic fragile fracture,  He had a prolonged rehabilitation, eventually came back home, try to resume his previous activity level, getting fatigued easily, also on polypharmacy for low back pain, planning on taking higher dose of Dilaudid from 8 to 10 mg 4 times a day, already on Cymbalta 30 mg twice a day, Celebrex 200 mg daily as needed, continued Parkinson medicine Sinemet CR 50/201 tablet 3 times a day, also on gabapentin, now is taking 1800 mg daily, was given the prescription up to 3600 mg  With current polypharmacy, he already complains of excessive drowsiness, have to take frequent naps, worry about higher dose of gabapentin may cause increased side effect  UPDATE Sept 12 2024: He is again brought in by his wife at today's clinical visit, apparently very frustrated about his continued decline, getting weaker, still try his best to stand up, but barely able to make few steps, leaning on his walker 45 degree, increased shoulder pain, developed Sacral area decubitus ulcer  Increased shortness of breath, using ventilator at night, prescription is from [facility] Dr. Viann Shove,  He was diagnosed with Ocular cicatricial pemphigoid (OCP) now  develop oral ulcer was diagnosed with benign mucous membrane pemphigoid (BMMP) is a rare, chronic autoimmune disease that causes blistering and scarring of the skin and mucous membranes   PHYSICAL EXAM:   Vitals:   05/15/23 1008  BP: 111/69  Pulse: (!) 57  Resp: 14  Height: 5\' 11"  (1.803 m)    PHYSICAL EXAMNIATION:  Gen: NAD, conversant, well nourised, well groomed                     Cardiovascular: Regular rate rhythm, no peripheral edema, warm, nontender. Eyes: Conjunctivae clear without exudates or hemorrhage Neck: Supple, no carotid bruits. Pulmonary: Decreased air movement at the left lung base  NEUROLOGICAL EXAM:  MENTAL STATUS: Speech/cognition: Awake, alert, oriented to history taking and casual conversation, mild slurred speech   CRANIAL NERVES: CN II: Visual fields are full to confrontation. Pupils are round equal and briskly reactive to light. CN III, IV, VI: extraocular movement are normal. No ptosis. CN V: Facial sensation is intact to light touch CN VII:  severe bilateral eye closure, cheek puff weakness, flaccid facial muscle CN VIII: Hearing is normal to causal conversation. CN IX, X: Phonation  is slurred CN XI: Head turning and shoulder shrug are intact  MOTOR: Significant atrophy of bilateral proximal arm muscles, especially bilateral deltoid, biceps, barely antigravity movement of bilateral proximal upper extremity muscles, left slightly weaker than the right, fairly normal bilateral handgrip, wrist extension, wrist flexion  Antigravity movement of bilateral lower extremity muscles, bilateral hip flexion, knee extension, knee flexion, trace movement of bilateral ankle dorsiflexion,  REFLEXES: Reflexes are areflexia  SENSORY: Intact to light touch,  COORDINATION: There is no trunk or limb dysmetria noted.  GAIT/STANCE: Deferred  REVIEW OF SYSTEMS:  Full 14 system review of systems performed and notable only for as above All other review of  systems were negative.   ALLERGIES: Allergies  Allergen Reactions   Celebrex [Celecoxib]     Burning sensation   Cymbalta [Duloxetine Hcl]     No relief    HOME MEDICATIONS: Current Outpatient Medications  Medication Sig Dispense Refill   carbidopa-levodopa (SINEMET CR) 50-200 MG tablet Take 1 tablet by mouth in the morning, at noon, and at bedtime. 270 tablet 4   diazepam (VALIUM) 5 MG tablet Take 2 tablets (10 mg total) by mouth 2 (two) times daily as needed for anxiety. 20 tablet 0   diclofenac Sodium (VOLTAREN) 1 % GEL Apply 2 g topically 4 (four) times daily. 2 g 0   gabapentin (NEURONTIN) 600 MG tablet Take 1 tablet (600 mg total) by mouth 4 (four) times daily. For nerve pain 360 tablet 4   HYDROmorphone (DILAUDID) 8 MG tablet Take 10 mg by mouth in the morning, at noon, in the evening, and at bedtime.     polyethylene glycol (MIRALAX / GLYCOLAX) 17 g packet Take 17 g by mouth at bedtime.     prednisoLONE acetate (PRED FORTE) 1 % ophthalmic suspension Place 1 drop into both eyes every other day.  0   timolol (BETIMOL) 0.5 % ophthalmic solution Place 1 drop into both eyes 2 (two) times daily.     zolpidem (AMBIEN) 10 MG tablet Take 10 mg by mouth at bedtime as needed for sleep.     No current facility-administered medications for this visit.    PAST MEDICAL HISTORY: Past Medical History:  Diagnosis Date   Bilateral foot-drop 10/19/2018   Bursitis, trochanteric    Episodic   Carpal tunnel syndrome on right    Degenerative disk disease    l5-S1   FSH (facioscapulohumeral muscular dystrophy) (HCC) 10/07/2017   Gait abnormality 10/19/2018   GERD (gastroesophageal reflux disease)    Hemorrhoids    with anal fissures   Left lateral epicondylitis    Parkinson's disease 10/19/2018   Skin cancer    right temple area    PAST SURGICAL HISTORY: Past Surgical History:  Procedure Laterality Date   CHOLECYSTECTOMY  2007   HEMORRHOIDECTOMY WITH HEMORRHOID BANDING     IR  KYPHO LUMBAR INC FX REDUCE BONE BX UNI/BIL CANNULATION INC/IMAGING  01/10/2020   IR KYPHO LUMBAR INC FX REDUCE BONE BX UNI/BIL CANNULATION INC/IMAGING  05/31/2022    FAMILY HISTORY: Family History  Problem Relation Age of Onset   Allergies Brother    Allergies Sister    Heart disease Father    Brain cancer Mother    Bone cancer Brother     SOCIAL HISTORY: Social History   Socioeconomic History   Marital status: Married    Spouse name: Andrey Campanile   Number of children: 0   Years of education: Not on file   Highest education level: Not on  file  Occupational History   Occupation: Programmer, systems [employer]  Tobacco Use   Smoking status: Never   Smokeless tobacco: Never  Vaping Use   Vaping status: Never Used  Substance and Sexual Activity   Alcohol use: No   Drug use: No   Sexual activity: Not on file  Other Topics Concern   Not on file  Social History Narrative   Lives with wife   Caffeine use: Sometimes tea   Right handed    Social Determinants of Health   Financial Resource Strain: Not on file  Food Insecurity: No Food Insecurity (05/27/2022)   Hunger Vital Sign    Worried About Running Out of Food in the Last Year: Never true    Ran Out of Food in the Last Year: Never true  Transportation Needs: No Transportation Needs (05/27/2022)   PRAPARE - Administrator, Civil Service (Medical): No    Lack of Transportation (Non-Medical): No  Physical Activity: Not on file  Stress: Not on file  Social Connections: Not on file  Intimate Partner Violence: Not At Risk (05/27/2022)   Humiliation, Afraid, Rape, and Kick questionnaire    Fear of Current or Ex-Partner: No    Emotionally Abused: No    Physically Abused: No    Sexually Abused: No    Levert Feinstein, M.D. Ph.D.  [facility] [address] [place], [place] [address] Ph: [phone] Fax: [phone]  CC:  Emilio Aspen, MD [address] [place],  [place] [address]  Emilio Aspen, MD

## 2023-06-02 ENCOUNTER — Encounter: Payer: Self-pay | Admitting: Physical Medicine and Rehabilitation

## 2023-06-02 ENCOUNTER — Encounter: Payer: Medicare Other | Attending: Physical Medicine & Rehabilitation | Admitting: Physical Medicine and Rehabilitation

## 2023-06-02 VITALS — BP 126/83 | HR 61 | Ht 71.0 in

## 2023-06-02 DIAGNOSIS — M25511 Pain in right shoulder: Secondary | ICD-10-CM | POA: Diagnosis present

## 2023-06-02 DIAGNOSIS — G20A1 Parkinson's disease without dyskinesia, without mention of fluctuations: Secondary | ICD-10-CM | POA: Diagnosis present

## 2023-06-02 DIAGNOSIS — G7102 Facioscapulohumeral muscular dystrophy: Secondary | ICD-10-CM | POA: Insufficient documentation

## 2023-06-02 DIAGNOSIS — M7918 Myalgia, other site: Secondary | ICD-10-CM | POA: Insufficient documentation

## 2023-06-02 MED ORDER — BETAMETHASONE SOD PHOS & ACET 6 (3-3) MG/ML IJ SUSP
12.0000 mg | Freq: Once | INTRAMUSCULAR | Status: AC
Start: 2023-06-02 — End: 2023-06-02
  Administered 2023-06-02: 12 mg via INTRAMUSCULAR

## 2023-06-02 MED ORDER — LIDOCAINE HCL 1 % IJ SOLN
6.0000 mL | Freq: Once | INTRAMUSCULAR | Status: AC
Start: 2023-06-02 — End: 2023-06-02
  Administered 2023-06-02: 6 mL

## 2023-06-02 MED ORDER — LIDOCAINE HCL 1 % IJ SOLN
1.0000 mL | Freq: Once | INTRAMUSCULAR | Status: AC
Start: 2023-06-02 — End: 2023-06-02
  Administered 2023-06-02: 1 mL

## 2023-06-02 NOTE — Progress Notes (Signed)
Pt is a 67 yr old male with hx of FSH muscular dystrophy- dx'd in 1992- - also has Parkinson's disease, dilated cardiomyopathy- EF 41%; compression fx s/p fall s/p kyphoplasty at L5; also has disc protrusion Left L5/S1 and L disc protrusion at Left L1-2.   Here for f/u on his FSH muscular dystrophy.  And trigger point injections and R Shoulder/RTC celestone injection. And Parkinson's diease. And chronic pain.     Went to see Dr Terrace Arabia-  She owuld continue to give Sinemet-  Won't give "next level up" of  Couldn't take Cymbalta- by Dr Blain Pais-  Dx of autoimmune disease-  Mucus membrane pemphigoid. Has had ~ 3 years.  And ocular pemphigoid - no meds can treat these dx's.    Legs starting to stiffen up really bad.   Falling- a lot- this week good- but 2 weeks ago, 1x/ day at leas.t  Needs neighbors to pick  him up.    Because of weakness in legs.  Stiff and weak- in legs.   Has signed documents for DNR- and end of life issues.    Exam: Awake, alert, bandaid on L ear, accompanied by wife; in transport w/c; NAD  MS; LLE_ HF 4- to 3+/5; KE 4/5; DF 0/5 and PF 4-/5 RLE- HF 3+/5; KE 4-/5 DF 0/5 and PF 4-/5   Neuro: Tremor from PD in RUE- constant unless moving.  Worse than when saw last time.   Shoulder really painful- on R- thinks from falls, likely.   Still taking Dilaudid 10 mg TID and will give 2-3 am overnight if needs 4th dose.  Moved up  in last few months.   Sleeping more- with gabapentin/Dilaudid- rest  better with pain meds  Kills his shoulder pushing down on RW so much to walk.       Plan: steroid injection was performed at R shoulder- RTC  using 1% plain Lidocaine and 40mg  /1cc of Celestone This was well tolerated.  Cleaned with betadine x3 and allowed to dry- then alcohol then injected using 27 gauge 1.5 inch needle- no bleeding or complications.    F/U in 3 months for steroid injections R shoulder/ RTC injection Lidocaine will kick in 15 minutes- and wear  off tonight- the steroid will kick in tomorrow within 24 hours and take up to 72 hours to fully kick in.   2. Patient here for trigger point injections for  Consent done and on chart.  Cleaned areas with alcohol and injected using a 27 gauge 1.5 inch needle  Injected 6 cc none wasted Using 1% Lidocaine with no EPI  Upper traps B/L x3 Levators- B/L  Posterior scalenes- B/L  Middle scalenes- B/L  Splenius Capitus- B/L  Pectoralis Major- b/L  Rhomboids- b/l x2 Infraspinatus Teres Major/minor Thoracic paraspinals Lumbar paraspinals Other injections-    Patient's level of pain prior was 10/10 Current level of pain after injections is- not hurting at the moment- unless moves R shoulder- then 10/10  There was no bleeding or complications.  Patient was advised to drink a lot of water on day after injections to flush system Will have increased soreness for 12-48 hours after injections.  Can use Lidocaine patches the day AFTER injections Can use theracane on day of injections in places didn't inject Can use heating pad 4-6 hours AFTER injections   3. We discussed end of life issues. Has DNR and end of life issues documented.    4. Dr Kevan Ny, PCP - would suggest putting in nursing  home-  Has thought about hospice. But would rather do SNF, than hospice.     5. Went over that hip flexion weakness is getting worse, so that's why falling more than anything.    6. F/U in 6 weeks. trP injections.    I spent a total of  40  minutes on total care today- >50% coordination of care- due to  10 minutes on injections total then 30 minutes discussing end of life issues; and my concern about worsening LE weakness- and goal for SNF.

## 2023-06-02 NOTE — Patient Instructions (Signed)
Plan: steroid injection was performed at R shoulder- RTC  using 1% plain Lidocaine and 40mg  /1cc of Celestone This was well tolerated.  Cleaned with betadine x3 and allowed to dry- then alcohol then injected using 27 gauge 1.5 inch needle- no bleeding or complications.    F/U in 3 months for steroid injections R shoulder/ RTC injection Lidocaine will kick in 15 minutes- and wear off tonight- the steroid will kick in tomorrow within 24 hours and take up to 72 hours to fully kick in.   2. Patient here for trigger point injections for  Consent done and on chart.  Cleaned areas with alcohol and injected using a 27 gauge 1.5 inch needle  Injected 6 cc none wasted Using 1% Lidocaine with no EPI  Upper traps B/L x3 Levators- B/L  Posterior scalenes- B/L  Middle scalenes- B/L  Splenius Capitus- B/L  Pectoralis Major- b/L  Rhomboids- b/l x2 Infraspinatus Teres Major/minor Thoracic paraspinals Lumbar paraspinals Other injections-    Patient's level of pain prior was 10/10 Current level of pain after injections is- not hurting at the moment- unless moves R shoulder- then 10/10  There was no bleeding or complications.  Patient was advised to drink a lot of water on day after injections to flush system Will have increased soreness for 12-48 hours after injections.  Can use Lidocaine patches the day AFTER injections Can use theracane on day of injections in places didn't inject Can use heating pad 4-6 hours AFTER injections   3. We discussed end of life issues. Has DNR and end of life issues documented.    4. Dr Kevan Ny, PCP - would suggest putting in nursing home-  Has thought about hospice. But would rather do SNF, than hospice.     5. Went over that hip flexion weakness is getting worse, so that's why falling more than anything.    6. F/U in 6 weeks. trP injections.

## 2023-06-30 ENCOUNTER — Ambulatory Visit: Payer: Medicare Other | Admitting: Physical Medicine and Rehabilitation

## 2023-07-14 ENCOUNTER — Encounter: Payer: Self-pay | Admitting: Physical Medicine and Rehabilitation

## 2023-07-14 ENCOUNTER — Ambulatory Visit (HOSPITAL_BASED_OUTPATIENT_CLINIC_OR_DEPARTMENT_OTHER): Payer: Medicare Other | Admitting: Internal Medicine

## 2023-07-14 ENCOUNTER — Encounter: Payer: Medicare Other | Attending: Physical Medicine & Rehabilitation | Admitting: Physical Medicine and Rehabilitation

## 2023-07-14 VITALS — BP 115/78 | HR 57 | Ht 71.0 in

## 2023-07-14 DIAGNOSIS — M7918 Myalgia, other site: Secondary | ICD-10-CM | POA: Diagnosis present

## 2023-07-14 DIAGNOSIS — G7102 Facioscapulohumeral muscular dystrophy: Secondary | ICD-10-CM | POA: Diagnosis not present

## 2023-07-14 DIAGNOSIS — M7061 Trochanteric bursitis, right hip: Secondary | ICD-10-CM | POA: Insufficient documentation

## 2023-07-14 MED ORDER — LIDOCAINE HCL 1 % IJ SOLN
6.0000 mL | Freq: Once | INTRAMUSCULAR | Status: AC
Start: 2023-07-14 — End: 2023-07-14
  Administered 2023-07-14: 6 mL

## 2023-07-14 MED ORDER — IBUPROFEN 600 MG PO TABS: 600.0000 mg | ORAL_TABLET | Freq: Three times a day (TID) | ORAL | 2 refills | Status: DC | PRN

## 2023-07-14 NOTE — Progress Notes (Signed)
Pt is a 67 yr old male with hx of FSH muscular dystrophy- dx'd in 1992- - also has Parkinson's disease, dilated cardiomyopathy- EF 41%; compression fx s/p fall s/p kyphoplasty at L5; also has disc protrusion Left L5/S1 and L disc protrusion at Left L1-2.   Here for f/u on his FSH muscular dystrophy.  And trigger point injections and R Shoulder/RTC celestone injection. And Parkinson's diease. And chronic pain.  Can have 3 better weeks, then 1.5 to 2 weeks of bad time.  Had to take cortisone injections in R hip- Dr Kevan Ny was PCP- but new PCP doesn't do injections. -  Also got steroid injections in shoulder-   Taking ibuprofen- usually 200-400 mg  And shoulder pain-   Plan: Next appointment- R trochanteric bursa injection.    2. Can give Ibuprofen 600 mg up to 3x/day- as needed for R hip pain- and arthritic pain.  To replace 200-400 mg Advil he's taking- to give a little better relief.    3. Patient here for trigger point injections for  Consent done and on chart.  Cleaned areas with alcohol and injected using a 27 gauge 1.5 inch needle  Injected 6cc- none wasted Using 1% Lidocaine with no EPI  Upper traps B/L  Levators- B/L  Posterior scalenes Middle scalenes- B/L Splenius Capitus- B/L  Pectoralis Major- B.L  Rhomboids- B/L  Infraspinatus Teres Major/minor Thoracic paraspinals Lumbar paraspinals Other injections-    Patient's level of pain prior was  10/10 Current level of pain after injections is pain better after injections  There was no bleeding or complications.  Patient was advised to drink a lot of water on day after injections to flush system Will have increased soreness for 12-48 hours after injections.  Can use Lidocaine patches the day AFTER injections Can use theracane on day of injections in places didn't inject Can use heating pad 4-6 hours AFTER injections  4. Will also do R shoulder injection at next visit- with R trochanteric bursa injection.   5  F/U in 6 weeks for all 2 steroid injections and trP injections.    I spent a total of  26   minutes on total care today- >50% coordination of care- due to  6 minutes on injections- rest d/w pt and wife about options for pain control- and prescribing 600 mg Advil.

## 2023-07-14 NOTE — Patient Instructions (Signed)
Plan: Next appointment- R trochanteric bursa injection.    2. Can give Ibuprofen 600 mg up to 3x/day- as needed for R hip pain- and arthritic pain.  To replace 200-400 mg Advil he's taking- to give a little better relief.    3. Patient here for trigger point injections for  Consent done and on chart.  Cleaned areas with alcohol and injected using a 27 gauge 1.5 inch needle  Injected 6cc- none wasted Using 1% Lidocaine with no EPI  Upper traps B/L  Levators- B/L  Posterior scalenes Middle scalenes- B/L Splenius Capitus- B/L  Pectoralis Major- B.L  Rhomboids- B/L  Infraspinatus Teres Major/minor Thoracic paraspinals Lumbar paraspinals Other injections-    Patient's level of pain prior was  10/10 Current level of pain after injections is pain better after injections  There was no bleeding or complications.  Patient was advised to drink a lot of water on day after injections to flush system Will have increased soreness for 12-48 hours after injections.  Can use Lidocaine patches the day AFTER injections Can use theracane on day of injections in places didn't inject Can use heating pad 4-6 hours AFTER injections  4. Will also do R shoulder injection at next visit- with R trochanteric bursa injection.   5 F/U in 6 weeks for all 2 steroid injections and trP injections.

## 2023-07-15 ENCOUNTER — Encounter (HOSPITAL_BASED_OUTPATIENT_CLINIC_OR_DEPARTMENT_OTHER): Payer: Medicare Other | Attending: Internal Medicine | Admitting: Internal Medicine

## 2023-07-15 ENCOUNTER — Encounter (HOSPITAL_BASED_OUTPATIENT_CLINIC_OR_DEPARTMENT_OTHER): Payer: Medicare Other | Admitting: Internal Medicine

## 2023-07-15 DIAGNOSIS — G8929 Other chronic pain: Secondary | ICD-10-CM | POA: Insufficient documentation

## 2023-07-15 DIAGNOSIS — G20A1 Parkinson's disease without dyskinesia, without mention of fluctuations: Secondary | ICD-10-CM | POA: Insufficient documentation

## 2023-07-15 DIAGNOSIS — G7102 Facioscapulohumeral muscular dystrophy: Secondary | ICD-10-CM | POA: Insufficient documentation

## 2023-07-15 DIAGNOSIS — R21 Rash and other nonspecific skin eruption: Secondary | ICD-10-CM | POA: Diagnosis present

## 2023-07-16 NOTE — Progress Notes (Signed)
BEORN, PORTMAN ([id number]) [id number] Page 1 of 7 Visit Report for 07/15/2023 Chief Complaint Document Details Patient Name: Date of Service: Luis [name] 07/15/2023 12:30 PM Medical Record Number: [id number] Patient Account Number: [id number] Date of Birth/Sex: Treating RN: [birth year] (67 y.o. M) Primary Care Provider: Eleanora Neighbor Other Clinician: Referring Provider: Treating Provider/Extender: Robley Fries in Treatment: 0 Information Obtained from: Patient Chief Complaint 07/15/2023; patient is here for review of areas that were thought to be pressure related on his buttock. Electronic Signature(s) Signed: 07/16/2023 10:44:47 AM By: Baltazar Najjar MD Entered By: Baltazar Najjar on 07/15/2023 14:37:20 -------------------------------------------------------------------------------- HPI Details Patient Name: Date of Service: [name], [name] 07/15/2023 12:30 PM Medical Record Number: [id number] Patient Account Number: [id number] Date of Birth/Sex: Treating RN: [birth year] (18 y.o. M) Primary Care Provider: Eleanora Neighbor Other Clinician: Referring Provider: Treating Provider/Extender: Robley Fries in Treatment: 0 History of Present Illness HPI Description: ADMISSION 07/15/2023 This is a patient who is very disabled secondary to combination of Parkinson's disease and FS HD. He also is in severe chronic pain. It seems over the last several months he has areas on both buttocks 2 on the left 1 on the right which appear to be small raised nodules. I think probably communicating with home health his primary physician felt they may be pressure related and referred him here. They have tried a combination of a steroid and antifungal cream as well as Silvadene with no effect. The patient's wife is rigorous about offloading this patient out of fear of pressure ulcers. They are concerned because  home health is stopping treatment on him next week probably because there is not a "" skilled need Electronic Signature(s) Signed: 07/16/2023 10:44:47 AM By: Baltazar Najjar MD Entered By: Baltazar Najjar on 07/15/2023 14:41:42 -------------------------------------------------------------------------------- Physical Exam Details Patient Name: Date of Service: [name], [name] 07/15/2023 12:30 PM Medical Record Number: [id number] Patient Account Number: [id number] Date of Birth/Sex: Treating RN: 09-21-1955 (67 y.o. Luis [name] ([id number]) [id number] Page 2 of 7 Primary Care Provider: Eleanora Neighbor Other Clinician: Referring Provider: Treating Provider/Extender: Robley Fries in Treatment: 0 Constitutional Sitting or standing Blood Pressure is within target range for patient.. Pulse regular and within target range for patient.Marland Kitchen Respirations regular, non-labored and within target range.. Temperature is normal and within the target range for the patient.Marland Kitchen Appears in no distress. Notes Wound exam; the patient has 3 small nodules on both buttocks, 2 on the left and 1 on the right. These are nonpigmented, nontender small nodules. There is no drainage. These do not appear to be pressure related. Electronic Signature(s) Signed: 07/16/2023 10:44:47 AM By: Baltazar Najjar MD Entered By: Baltazar Najjar on 07/15/2023 14:39:25 -------------------------------------------------------------------------------- Physician Orders Details Patient Name: Date of Service: [name], [name] 07/15/2023 12:30 PM Medical Record Number: [id number] Patient Account Number: [id number] Date of Birth/Sex: Treating RN: Jan 02, 1956 (67 y.o. [name] Primary Care Provider: Eleanora Neighbor Other Clinician: Referring Provider: Treating Provider/Extender: Robley Fries in Treatment: 0 Verbal / Phone Orders:  No Diagnosis Coding Discharge From [name] Services Discharge from Wound Care Center - Call if any future wound care needs. Off-Loading Turn and reposition every 2 hours Other: - Rigorously offload these closed areas. minimize sitting for long periods of time. Use a small pillow to offload when sitting and rotate to each time to aid in offloading. May apply AandD ointment for protection from friction and dryness. Electronic Signature(s) Signed: 07/15/2023  5:16:35 PM By: Shawn Stall RN, BSN Signed: 07/16/2023 10:44:47 AM By: Baltazar Najjar MD Entered By: Shawn Stall on 07/15/2023 13:40:14 -------------------------------------------------------------------------------- Problem List Details Patient Name: Date of Service: [name], [name] 07/15/2023 12:30 PM Medical Record Number: [id number] Patient Account Number: [id number] Date of Birth/Sex: Treating RN: 1956-04-04 (49 y.o. 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 07/15/2023 12:30 PM Medical Record Number: [id number] Patient Account Number: [id number] Date of Birth/Sex: Treating RN: 05/09/1956 (44 y.o. M) Primary Care Provider: Eleanora Neighbor Other  Clinician: Referring Provider: Treating Provider/Extender: Robley Fries in Treatment: 0 Subjective Chief Complaint Information obtained from Patient 07/15/2023; patient is here for review of areas that were thought to be pressure related on his buttock. History of Present Illness (HPI) ADMISSION 07/15/2023 This is a patient who is very disabled secondary to combination of Parkinson's disease and FS HD. He also is in severe chronic pain. It seems over the last several months he has areas on both buttocks 2 on the left 1 on the right which appear to be small raised nodules. I think probably communicating with home health his primary physician felt they may be pressure related and referred him here. They have tried a combination of a steroid and antifungal cream as well as Silvadene with no effect. The patient's wife is rigorous about offloading this patient out of fear of pressure ulcers. They are concerned because home health is stopping treatment on him next week probably because there is not a "" skilled need Patient History Information obtained from Patient. Allergies Celebrex, Cymbalta Family History Cancer - Mother,Siblings, Heart Disease - Father, No family history of Hereditary Spherocytosis, Hypertension, Kidney Disease, Lung Disease, Seizures, Stroke, Thyroid Problems, Tuberculosis. Social History Never smoker, Marital Status - Married, Alcohol Use - Never, Drug Use - No History, Caffeine Use - Never. Medical History Eyes Patient has history of Cataracts Denies history of Glaucoma, Optic Neuritis Ear/Nose/Mouth/Throat Denies history of Chronic sinus problems/congestion, Middle ear problems Hematologic/Lymphatic Denies history of Anemia, Hemophilia, Human Immunodeficiency Virus, Lymphedema, Sickle Cell Disease Respiratory Patient has history of Sleep Apnea Denies history of Aspiration, Asthma, Chronic Obstructive Pulmonary Disease (COPD),  Pneumothorax, Tuberculosis Cardiovascular Denies history of Angina, Arrhythmia, Congestive Heart Failure, Coronary Artery Disease, Deep Vein Thrombosis, Hypertension, Hypotension, Myocardial Infarction, Peripheral Arterial Disease, Peripheral Venous Disease, Phlebitis, Vasculitis Luis [name], Luis [name] ([id number]) [id number] Page 4 of 7 Gastrointestinal Denies history of Cirrhosis , Colitis, Crohns, Hepatitis A, Hepatitis B, Hepatitis C Genitourinary Denies history of End Stage Renal Disease Immunological Denies history of Lupus Erythematosus, Raynauds, Scleroderma Musculoskeletal Patient has history of Gout Denies history of Rheumatoid Arthritis, Osteoarthritis, Osteomyelitis Neurologic Denies history of Dementia, Neuropathy, Quadriplegia, Paraplegia, Seizure Disorder Hospitalization/Surgery History - 9/23 Ir kypho lumbar inc fx reduce bone bx uni/bil cannulation inc/imaging. - 2007 Cholecystectomy. Medical A Surgical History Notes nd Constitutional Symptoms (General Health) Parkinson's disease bilateral foot drop FSH (facioscapulohumeral muscular dystrophy) 1991 vitamin D deficient Respiratory diaphragm paralysis. Cardiovascular cardiomyopathy Musculoskeletal DJD carpel tunnel  syndrome facet arthritis of [place] chronic low back pain without sciatica Neurologic Muscular dystrophy, Parkinson's, Review of Systems (ROS) Constitutional Symptoms (General Health) Denies complaints or symptoms of Fatigue, Fever, Chills, Marked Weight Change. Eyes Denies complaints or symptoms of Dry Eyes, Vision Changes, Glasses / Contacts. Ear/Nose/Mouth/Throat Denies complaints or symptoms of Chronic sinus problems or rhinitis. Cardiovascular Denies complaints or symptoms of Chest pain. Gastrointestinal Denies complaints or symptoms of Frequent diarrhea, Nausea, Vomiting. Endocrine Denies complaints or symptoms of Heat/cold intolerance. Genitourinary Denies complaints  or symptoms of Frequent urination. Integumentary (Skin) Denies complaints or symptoms of Wounds. Musculoskeletal Denies complaints or symptoms of Muscle Pain, Muscle Weakness. Psychiatric Denies complaints or symptoms of Claustrophobia. Objective Constitutional Sitting or standing Blood Pressure is within target range for patient.. Pulse regular and within target range for patient.Marland Kitchen Respirations regular, non-labored and within target range.. Temperature is normal and within the target range for the patient.Marland Kitchen Appears in no distress. Vitals Time Taken: 12:55 PM, Height: 70 in, Source: Stated, Weight: 155 lbs, Source: Stated, BMI: 22.2, Temperature: 98.3 F, Pulse: 60 bpm, Respiratory Rate: 18 breaths/min, Blood Pressure: 126/80 mmHg. General Notes: Wound exam; the patient has 3 small nodules on both buttocks, 2 on the left and 1 on the right. These are nonpigmented, nontender small nodules. There is no drainage. These do not appear to be pressure related. Assessment Active Problems ICD-10 Rash and other nonspecific skin eruption Parkinsonism, unspecified Facioscapulohumeral muscular dystrophy Plan Loftin, XANDYR DELACUEVA ([id number]) [id number] Page 5 of 7 Discharge From [facility] Services: Discharge from Wound Care Center - Call if any future wound care needs. Off-Loading: Turn and reposition every 2 hours Other: - Rigorously offload these closed areas. minimize sitting for long periods of time. Use a small pillow to offload when sitting and rotate to each time to aid in offloading. May apply AandD ointment for protection from friction and dryness. 1. These are not pressure areas on his buttock. They are small benign looking nodules. There is nothing that looks aggressive here. 2. I would continue to observe these if they get larger they may need to be a biopsied I was not prepared to do that today for fear of creating a wound where currently none exists. 3. May represent a  dermatofibroma although I understand this is an atypical site. 4. He does not need to be followed here for this Electronic Signature(s) Signed: 07/16/2023 10:44:47 AM By: Baltazar Najjar MD Entered By: Baltazar Najjar on 07/15/2023 14:44:26 -------------------------------------------------------------------------------- HxROS Details Patient Name: Date of Service: Bordenave, [name] 07/15/2023 12:30 PM Medical Record Number: [id number] Patient Account Number: [id number] Date of Birth/Sex: Treating RN: 01/29/1956 (67 y.o. [name] Primary Care Provider: Eleanora Neighbor Other Clinician: Referring Provider: Treating Provider/Extender: Robley Fries in Treatment: 0 Information Obtained From Patient Constitutional Symptoms (General Health) Complaints and Symptoms: Negative for: Fatigue; Fever; Chills; Marked Weight Change Medical History: Past Medical History Notes: Parkinson's disease bilateral foot drop FSH (facioscapulohumeral muscular dystrophy) 1991 vitamin D deficient Eyes Complaints and Symptoms: Negative for: Dry Eyes; Vision Changes; Glasses / Contacts Medical History: Positive for: Cataracts Negative for: Glaucoma; Optic Neuritis Ear/Nose/Mouth/Throat Complaints and Symptoms: Negative for: Chronic sinus problems or rhinitis Medical History: Negative for: Chronic sinus problems/congestion; Middle ear problems Cardiovascular Complaints and Symptoms: Negative for: Chest pain Medical History: Negative for: Angina; Arrhythmia; Congestive Heart Failure; Coronary Artery Disease; Deep Vein Thrombosis; Hypertension; Hypotension; Myocardial Infarction; Peripheral Arterial Disease; Peripheral Venous Disease; Phlebitis; Vasculitis Past Medical History Notes: cardiomyopathy Vazques, [facility]  E ([id number]) [id number] Page 6 of 7 Gastrointestinal Complaints and Symptoms: Negative for: Frequent diarrhea; Nausea;  Vomiting Medical History: Negative for: Cirrhosis ; Colitis; Crohns; Hepatitis A; Hepatitis B; Hepatitis C Endocrine Complaints and Symptoms: Negative for: Heat/cold intolerance Genitourinary Complaints and Symptoms: Negative for: Frequent urination Medical History: Negative for: End Stage Renal Disease Integumentary (Skin) Complaints and Symptoms: Negative for: Wounds Musculoskeletal Complaints and Symptoms: Negative for: Muscle Pain; Muscle Weakness Medical History: Positive for: Gout Negative for: Rheumatoid Arthritis; Osteoarthritis; Osteomyelitis Past Medical History Notes: DJD carpel tunnel syndrome facet arthritis of [place] chronic low back pain without sciatica Psychiatric Complaints and Symptoms: Negative for: Claustrophobia Hematologic/Lymphatic Medical History: Negative for: Anemia; Hemophilia; Human Immunodeficiency Virus; Lymphedema; Sickle Cell Disease Respiratory Medical History: Positive for: Sleep Apnea Negative for: Aspiration; Asthma; Chronic Obstructive Pulmonary Disease (COPD); Pneumothorax; Tuberculosis Past Medical History Notes: diaphragm paralysis. Immunological Medical History: Negative for: Lupus Erythematosus; Raynauds; Scleroderma Neurologic Medical History: Negative for: Dementia; Neuropathy; Quadriplegia; Paraplegia; Seizure Disorder Past Medical History Notes: Muscular dystrophy, Parkinson's, Oncologic HBO Extended History Items Eyes: Cataracts Immunizations Pneumococcal Vaccine: Received Pneumococcal Vaccination: No Luis [name], Luis [name] ([id number]) [id number] Page 7 of 7 Implantable Devices None Hospitalization / Surgery History Type of Hospitalization/Surgery 9/23 Ir kypho lumbar inc fx reduce bone bx uni/bil cannulation inc/imaging 2007 Cholecystectomy Family and Social History Cancer: Yes - Mother,Siblings; Heart Disease: Yes - Father; Hereditary Spherocytosis: No; Hypertension: No; Kidney  Disease: No; Lung Disease: No; Seizures: No; Stroke: No; Thyroid Problems: No; Tuberculosis: No; Never smoker; Marital Status - Married; Alcohol Use: Never; Drug Use: No History; Caffeine Use: Never; Financial Concerns: No; Food, Clothing or Shelter Needs: No; Support System Lacking: No; Transportation Concerns: No Psychologist, prison and probation services) Signed: 07/15/2023 5:16:35 PM By: Shawn Stall RN, BSN Signed: 07/16/2023 10:44:47 AM By: Baltazar Najjar MD Signed: 07/16/2023 4:47:30 PM By: Thayer Dallas Entered By: Thayer Dallas on 07/15/2023 13:10:54 -------------------------------------------------------------------------------- SuperBill Details Patient Name: Date of Service: Ackers, Caprice Red E. 07/15/2023 Medical Record Number: [id number] Patient Account Number: [id number] Date of Birth/Sex: Treating RN: August 29, 1956 (67 y.o. 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

## 2023-07-16 NOTE — Progress Notes (Signed)
Soderholm, HAMID HASER ([id number]) [phone].pdf Page 1 of 5 Visit Report for 07/15/2023 Allergy List Details Patient Name: Date of Service: Luis [name], Luis [name] 07/15/2023 12:30 PM Medical Record Number: [id number] Patient Account Number: [id number] Date of Birth/Sex: Treating RN: 07-22-1956 (67 y.o. [name] Primary Care Cleotha Tsang: Eleanora Neighbor Other Clinician: Referring Triniti Gruetzmacher: Treating Amor Packard/Extender: Robley Fries in Treatment: 0 Allergies Active Allergies Celebrex Cymbalta Allergy Notes Electronic Signature(s) Signed: 07/16/2023 4:47:30 PM By: Thayer Dallas Entered By: Thayer Dallas on 07/15/2023 12:59:21 -------------------------------------------------------------------------------- Arrival Information Details Patient Name: Date of Service: Luis [name], Luis Red E. 07/15/2023 12:30 PM Medical Record Number: [id number] Patient Account Number: [id number] Date of Birth/Sex: Treating RN: 10-Sep-1955 (42 y.o. M) Primary Care Javonn Gauger: Eleanora Neighbor Other Clinician: Referring Hanah Moultry: Treating Jordie Schreur/Extender: Robley Fries in Treatment: 0 Visit Information Patient Arrived: Wheel Chair Arrival Time: 12:54 Accompanied By: wife Transfer Assistance: Manual Patient Identification Verified: Yes Secondary Verification Process Completed: Yes Patient Requires Transmission-Based Precautions: No Patient Has Alerts: No Electronic Signature(s) Signed: 07/16/2023 4:47:30 PM By: Thayer Dallas Entered By: Thayer Dallas on 07/15/2023 12:55:41 Clinic Level of Care Assessment Details -------------------------------------------------------------------------------- Luis [name], Luis [name] ([id number]) [phone].pdf Page 2 of 5 Patient Name: Date of Service: Luis [name], Luis [name] 07/15/2023 12:30 PM Medical Record Number: [id number] Patient Account Number: [id number] Date of Birth/Sex: Treating  RN: 1955-11-11 (67 y.o. [name] Primary Care Trishelle Devora: Eleanora Neighbor Other Clinician: Referring Saniya Tranchina: Treating Tasheka Houseman/Extender: Robley Fries in Treatment: 0 Clinic Level of Care Assessment Items TOOL 2 Quantity Score X- 1 0 Use when only an EandM is performed on the INITIAL visit ASSESSMENTS - Nursing Assessment / Reassessment X- 1 20 General Physical Exam (combine w/ comprehensive assessment (listed just below) when performed on new pt. evals) X- 1 25 Comprehensive Assessment (HX, ROS, Risk Assessments, Wounds Hx, etc.) ASSESSMENTS - Wound and Skin A ssessment / Reassessment []  - 0 Simple Wound Assessment / Reassessment - one wound []  - 0 Complex Wound Assessment / Reassessment - multiple wounds X- 1 10 Dermatologic / Skin Assessment (not related to wound area) ASSESSMENTS - Ostomy and/or Continence Assessment and Care []  - 0 Incontinence Assessment and Management []  - 0 Ostomy Care Assessment and Management (repouching, etc.) PROCESS - Coordination of Care X - Simple Patient / Family Education for ongoing care 1 15 []  - 0 Complex (extensive) Patient / Family Education for ongoing care X- 1 10 Staff obtains Chiropractor, Records, T Results / Process Orders est []  - 0 Staff telephones HHA, Nursing Homes / Clarify orders / etc []  - 0 Routine Transfer to another Facility (non-emergent condition) []  - 0 Routine Hospital Admission (non-emergent condition) []  - 0 New Admissions / Manufacturing engineer / Ordering NPWT Apligraf, etc. , []  - 0 Emergency Hospital Admission (emergent condition) X- 1 10 Simple Discharge Coordination []  - 0 Complex (extensive) Discharge Coordination PROCESS - Special Needs []  - 0 Pediatric / Minor Patient Management []  - 0 Isolation Patient Management []  - 0 Hearing / Language / Visual special needs []  - 0 Assessment of Community assistance (transportation, D/C planning, etc.) []  -  0 Additional assistance / Altered mentation []  - 0 Support Surface(s) Assessment (bed, cushion, seat, etc.) INTERVENTIONS - Wound Cleansing / Measurement []  - 0 Wound Imaging (photographs - any number of wounds) []  - 0 Wound Tracing (instead of photographs) []  - 0 Simple Wound Measurement - one wound []  - 0 Complex Wound Measurement - multiple wounds []  - 0 Simple Wound  Cleansing - one wound []  - 0 Complex Wound Cleansing - multiple wounds INTERVENTIONS - Wound Dressings []  - 0 Small Wound Dressing one or multiple wounds []  - 0 Medium Wound Dressing one or multiple wounds []  - 0 Large Wound Dressing one or multiple wounds Luis [name], Luis [name] ([id number]) [phone].pdf Page 3 of 5 []  - 0 Application of Medications - injection INTERVENTIONS - Miscellaneous []  - 0 External ear exam []  - 0 Specimen Collection (cultures, biopsies, blood, body fluids, etc.) []  - 0 Specimen(s) / Culture(s) sent or taken to Lab for analysis []  - 0 Patient Transfer (multiple staff / Michiel Sites Lift / Similar devices) []  - 0 Simple Staple / Suture removal (25 or less) []  - 0 Complex Staple / Suture removal (26 or more) []  - 0 Hypo / Hyperglycemic Management (close monitor of Blood Glucose) []  - 0 Ankle / Brachial Index (ABI) - do not check if billed separately Has the patient been seen at the hospital within the last three years: Yes Total Score: 90 Level Of Care: New/Established - Level 3 Electronic Signature(s) Signed: 07/15/2023 5:16:35 PM By: Shawn Stall RN, BSN Entered By: Shawn Stall on 07/15/2023 13:38:48 -------------------------------------------------------------------------------- Encounter Discharge Information Details Patient Name: Date of Service: Luis [name], Luis HN E. 07/15/2023 12:30 PM Medical Record Number: [id number] Patient Account Number: [id number] Date of Birth/Sex: Treating RN: [birth year] (67 y.o. [name] Primary Care Maylen Waltermire: Eleanora Neighbor Other Clinician: Referring Nashton Belson: Treating Tabytha Gradillas/Extender: Robley Fries in Treatment: 0 Encounter Discharge Information Items Discharge Condition: Stable Ambulatory Status: Wheelchair Discharge Destination: Home Transportation: Private Auto Accompanied By: wife Schedule Follow-up Appointment: No Clinical Summary of Care: Electronic Signature(s) Signed: 07/15/2023 5:16:35 PM By: Shawn Stall RN, BSN Entered By: Shawn Stall on 07/15/2023 13:39:12 -------------------------------------------------------------------------------- Lower Extremity Assessment Details Patient Name: Date of Service: Luis [name], Luis HN E. 07/15/2023 12:30 PM Medical Record Number: [id number] Patient Account Number: [id number] Date of Birth/Sex: Treating RN: Mar 21, 1956 (53 y.o. M) Primary Care Shonya Sumida: Eleanora Neighbor Other Clinician: Referring Mckinley Adelstein: Treating Dravon Nott/Extender: Robley Fries in Treatment: 0 Notes Luis [name], Luis [name] ([id number]) [id number] Page 4 of 5 wound on buttock Electronic Signature(s) Signed: 07/16/2023 4:47:30 PM By: Thayer Dallas Entered By: Thayer Dallas on 07/15/2023 13:16:06 -------------------------------------------------------------------------------- Multi-Disciplinary Care Plan Details Patient Name: Date of Service: Luis [name], Luis Red E. 07/15/2023 12:30 PM Medical Record Number: [id number] Patient Account Number: [id number] Date of Birth/Sex: Treating RN: 1956/03/05 (67 y.o. [name] Primary Care Antonie Borjon: Eleanora Neighbor Other Clinician: Referring Darneshia Demary: Treating Drayk Humbarger/Extender: Robley Fries in Treatment: 0 Active Inactive Electronic Signature(s) Signed: 07/15/2023 5:16:35 PM By: Shawn Stall RN, BSN Entered By: Shawn Stall on 07/15/2023  13:35:33 -------------------------------------------------------------------------------- Pain Assessment Details Patient Name: Date of Service: Luis [name], Luis HN E. 07/15/2023 12:30 PM Medical Record Number: [id number] Patient Account Number: [id number] Date of Birth/Sex: Treating RN: 1956/04/10 (67 y.o. [name] Primary Care Madie Cahn: Eleanora Neighbor Other Clinician: Referring Lajada Janes: Treating Judee Hennick/Extender: Robley Fries in Treatment: 0 Active Problems Location of Pain Severity and Description of Pain Patient Has Paino No Site Locations Pain Management and Medication Current Pain Management: AHAAN, PAREKH ([id number]) [phone].pdf Page 5 of 5 Electronic Signature(s) Signed: 07/15/2023 5:16:35 PM By: Shawn Stall RN, BSN Entered By: Shawn Stall on 07/15/2023 13:29:38 -------------------------------------------------------------------------------- Patient/Caregiver Education Details Patient Name: Date of Service: Luis [name] 11/12/2024andnbsp12:30 PM Medical Record Number: [id number] Patient Account Number: [id number] Date of Birth/Gender: Treating RN: 07-25-1956 (67 y.o. [name] Primary Care  Physician: Eleanora Neighbor Other Clinician: Referring Physician: Treating Physician/Extender: Robley Fries in Treatment: 0 Education Assessment Education Provided To: Patient Education Topics Provided Wound/Skin Impairment: Handouts: Caring for Your Ulcer Methods: Explain/Verbal Responses: Reinforcements needed Electronic Signature(s) Signed: 07/15/2023 5:16:35 PM By: Shawn Stall RN, BSN Entered By: Shawn Stall on 07/15/2023 13:15:30 -------------------------------------------------------------------------------- Vitals Details Patient Name: Date of Service: Luis [name], Luis HN E. 07/15/2023 12:30 PM Medical Record Number: [id number] Patient Account Number: [id number] Date  of Birth/Sex: Treating RN: 1955/12/28 (67 y.o. M) Primary Care Bonny Vanleeuwen: Eleanora Neighbor Other Clinician: Referring Zitlali Primm: Treating Willson Lipa/Extender: Robley Fries in Treatment: 0 Vital Signs Time Taken: 12:55 Temperature (F): 98.3 Height (in): 70 Pulse (bpm): 60 Source: Stated Respiratory Rate (breaths/min): 18 Weight (lbs): 155 Blood Pressure (mmHg): 126/80 Source: Stated Reference Range: 80 - 120 mg / dl Body Mass Index (BMI): 22.2 Electronic Signature(s) Signed: 07/16/2023 4:47:30 PM By: Thayer Dallas Entered By: Thayer Dallas on 07/15/2023 12:58:03

## 2023-07-16 NOTE — Progress Notes (Signed)
ADOLPH, SALAMI ([id number]) [phone] [id number] Page 1 of 4 Visit Report for 07/15/2023 Abuse Risk Screen Details Patient Name: Date of Service: Luis [name], Luis [name] 07/15/2023 12:30 PM Medical Record Number: [id number] Patient Account Number: [id number] Date of Birth/Sex: Treating RN: 15-Oct-1955 (67 y.o. M) Primary Care Ofelia Podolski: Eleanora Neighbor Other Clinician: Referring Nydia Ytuarte: Treating Cleota Pellerito/Extender: Robley Fries in Treatment: 0 Abuse Risk Screen Items Answer ABUSE RISK SCREEN: Has anyone close to you tried to hurt or harm you recentlyo No Do you feel uncomfortable with anyone in your familyo No Has anyone forced you do things that you didnt want to doo No Electronic Signature(s) Signed: 07/16/2023 4:47:30 PM By: Thayer Dallas Entered By: Thayer Dallas on 07/15/2023 13:11:42 -------------------------------------------------------------------------------- Activities of Daily Living Details Patient Name: Date of Service: Luis [name], Luis [name] 07/15/2023 12:30 PM Medical Record Number: [id number] Patient Account Number: [id number] Date of Birth/Sex: Treating RN: 1956-01-30 (67 y.o. M) Primary Care Thressa Shiffer: Eleanora Neighbor Other Clinician: Referring Maliya Marich: Treating Saundra Gin/Extender: Robley Fries in Treatment: 0 Activities of Daily Living Items Answer Activities of Daily Living (Please select one for each item) Drive Automobile Not Able T Medications ake Need Assistance Use T elephone Completely Able Care for Appearance Need Assistance Use T oilet Need Assistance Bath / Shower Need Assistance Dress Self Need Assistance Feed Self Need Assistance Walk Not Able Get In / Out Bed Need Assistance Housework Not Able Prepare Meals Not Able Handle Money Need Assistance Shop for Self Not Able Electronic Signature(s) Signed: 07/16/2023 4:47:30 PM By: Thayer Dallas Entered By: Thayer Dallas on 07/15/2023 13:12:37 Seaman, Balinda Quails ([id number]) [id number] Page 2 of 4 -------------------------------------------------------------------------------- Education Screening Details Patient Name: Date of Service: Luis [name], Luis [name] 07/15/2023 12:30 PM Medical Record Number: [id number] Patient Account Number: [id number] Date of Birth/Sex: Treating RN: 05/16/1956 (67 y.o. M) Primary Care Terriana Barreras: Eleanora Neighbor Other Clinician: Referring Dvante Hands: Treating Imanuel Pruiett/Extender: Robley Fries in Treatment: 0 Primary Learner Assessed: Patient Learning Preferences/Education Level/Primary Language Learning Preference: Explanation, Communication Board Highest Education Level: College or Above Preferred Language: English Cognitive Barrier Language Barrier: No Translator Needed: No Memory Deficit: No Emotional Barrier: No Cultural/Religious Beliefs Affecting Medical Care: No Physical Barrier Impaired Vision: No Impaired Hearing: No Decreased Hand dexterity: No Knowledge/Comprehension Knowledge Level: High Comprehension Level: High Ability to understand written instructions: High Ability to understand verbal instructions: High Motivation Anxiety Level: Calm Cooperation: Cooperative Education Importance: Acknowledges Need Interest in Health Problems: Asks Questions Perception: Coherent Willingness to Engage in Self-Management High Activities: Readiness to Engage in Self-Management High Activities: Electronic Signature(s) Signed: 07/16/2023 4:47:30 PM By: Thayer Dallas Entered By: Thayer Dallas on 07/15/2023 13:13:19 -------------------------------------------------------------------------------- Fall Risk Assessment Details Patient Name: Date of Service: Luis [name], Luis HN E. 07/15/2023 12:30 PM Medical Record Number: [id number] Patient Account Number: [id number] Date of Birth/Sex: Treating RN: July 26, 1956 (67  y.o. M) Primary Care Lileigh Fahringer: Eleanora Neighbor Other Clinician: Referring Torell Minder: Treating Cheronda Erck/Extender: Robley Fries in Treatment: 0 Fall Risk Assessment Items Have you had 2 or more falls in the last [address] monthso 0 Yes Mcray, RUTH ENGELHARD ([id number]) [phone] [id number] Page 3 of 4 Have you had any fall that resulted in injury in the last 12 monthso 0 Yes FALLS RISK SCREEN History of falling - immediate or within 3 months 25 Yes Secondary diagnosis (Do you have 2 or more medical diagnoseso) 0 No Ambulatory aid None/bed rest/wheelchair/nurse 0 No Crutches/cane/walker 15 Yes Furniture 0 No Intravenous therapy Access/Saline/Heparin Lock 0  No Gait/Transferring Normal/ bed rest/ wheelchair 0 No Weak (short steps with or without shuffle, stooped but able to lift head while walking, may seek 0 No support from furniture) Impaired (short steps with shuffle, may have difficulty arising from chair, head down, impaired 20 Yes balance) Mental Status Oriented to own ability 0 Yes Electronic Signature(s) Signed: 07/16/2023 4:47:30 PM By: Thayer Dallas Entered By: Thayer Dallas on 07/15/2023 13:13:53 -------------------------------------------------------------------------------- Foot Assessment Details Patient Name: Date of Service: Luis [name], Luis Red E. 07/15/2023 12:30 PM Medical Record Number: [id number] Patient Account Number: [id number] Date of Birth/Sex: Treating RN: 05/22/1956 (67 y.o. M) Primary Care Tanzie Rothschild: Eleanora Neighbor Other Clinician: Referring Nereyda Bowler: Treating Tonisha Silvey/Extender: Robley Fries in Treatment: 0 Foot Assessment Items Site Locations + = Sensation present, - = Sensation absent, C = Callus, U = Ulcer R = Redness, W = Warmth, M = Maceration, PU = Pre-ulcerative lesion F = Fissure, S = Swelling, D = Dryness Assessment Right: Left: Other Deformity: No No Prior Foot Ulcer:  No No Prior Amputation: No No Charcot Joint: No No Ambulatory Status: Non-ambulatory Assistance Device: Wheelchair Luis [name], Luis [name] ([id number]) [id number] Page 4 of 4 Gait: Unsteady Notes wound on buttock Electronic Signature(s) Signed: 07/16/2023 4:47:30 PM By: Thayer Dallas Entered By: Thayer Dallas on 07/15/2023 13:15:50 -------------------------------------------------------------------------------- Nutrition Risk Screening Details Patient Name: Date of Service: Luis [name], CUBBISON. 07/15/2023 12:30 PM Medical Record Number: [id number] Patient Account Number: [id number] Date of Birth/Sex: Treating RN: 19-Nov-1955 (67 y.o. M) Primary Care Leray Garverick: Eleanora Neighbor Other Clinician: Referring Asyia Hornung: Treating Asiana Benninger/Extender: Robley Fries in Treatment: 0 Height (in): 70 Weight (lbs): 155 Body Mass Index (BMI): 22.2 Nutrition Risk Screening Items Score Screening NUTRITION RISK SCREEN: I have an illness or condition that made me change the kind and/or amount of food I eat 2 Yes I eat fewer than two meals per day 0 No I eat few fruits and vegetables, or milk products 0 No I have three or more drinks of beer, liquor or wine almost every day 0 No I have tooth or mouth problems that make it hard for me to eat 0 No I don't always have enough money to buy the food I need 0 No I eat alone most of the time 0 No I take three or more different prescribed or over-the-counter drugs a day 1 Yes Without wanting to, I have lost or gained 10 pounds in the last six months 0 No I am not always physically able to shop, cook and/or feed myself 0 No Nutrition Protocols Good Risk Protocol Moderate Risk Protocol 0 Provide education on nutrition High Risk Proctocol Risk Level: Moderate Risk Score: 3 Electronic Signature(s) Signed: 07/16/2023 4:47:30 PM By: Thayer Dallas Entered By: Thayer Dallas on 07/15/2023 13:15:25

## 2023-07-28 ENCOUNTER — Ambulatory Visit: Payer: Medicare Other | Admitting: Physical Medicine and Rehabilitation

## 2023-08-25 ENCOUNTER — Encounter: Payer: Medicare Other | Admitting: Physical Medicine and Rehabilitation

## 2023-09-19 ENCOUNTER — Encounter: Payer: Medicare Other | Attending: Physical Medicine & Rehabilitation | Admitting: Physical Medicine and Rehabilitation

## 2023-10-06 ENCOUNTER — Ambulatory Visit: Payer: Medicare Other | Admitting: Physical Medicine and Rehabilitation

## 2023-10-10 ENCOUNTER — Encounter: Payer: Self-pay | Admitting: Physical Medicine and Rehabilitation

## 2023-10-10 ENCOUNTER — Encounter: Payer: Medicare Other | Attending: Physical Medicine & Rehabilitation | Admitting: Physical Medicine and Rehabilitation

## 2023-10-10 VITALS — BP 130/81 | HR 62 | Ht 71.0 in | Wt 150.0 lb

## 2023-10-10 DIAGNOSIS — R269 Unspecified abnormalities of gait and mobility: Secondary | ICD-10-CM | POA: Insufficient documentation

## 2023-10-10 DIAGNOSIS — M7918 Myalgia, other site: Secondary | ICD-10-CM | POA: Diagnosis present

## 2023-10-10 DIAGNOSIS — M7711 Lateral epicondylitis, right elbow: Secondary | ICD-10-CM | POA: Diagnosis not present

## 2023-10-10 DIAGNOSIS — M7712 Lateral epicondylitis, left elbow: Secondary | ICD-10-CM | POA: Insufficient documentation

## 2023-10-10 DIAGNOSIS — G7102 Facioscapulohumeral muscular dystrophy: Secondary | ICD-10-CM | POA: Insufficient documentation

## 2023-10-10 MED ORDER — BETAMETHASONE SOD PHOS & ACET 6 (3-3) MG/ML IJ SUSP
24.0000 mg | Freq: Once | INTRAMUSCULAR | Status: AC
Start: 2023-10-10 — End: 2023-10-10
  Administered 2023-10-10: 24 mg via INTRAMUSCULAR

## 2023-10-10 MED ORDER — IBUPROFEN 800 MG PO TABS: 800.0000 mg | ORAL_TABLET | Freq: Three times a day (TID) | ORAL | 5 refills | Status: DC | PRN

## 2023-10-10 MED ORDER — LIDOCAINE HCL 1 % IJ SOLN
6.0000 mL | Freq: Once | INTRAMUSCULAR | Status: AC
Start: 2023-10-10 — End: 2023-10-10
  Administered 2023-10-10: 6 mL

## 2023-10-10 MED ORDER — LIDOCAINE HCL 1 % IJ SOLN
2.0000 mL | Freq: Once | INTRAMUSCULAR | Status: AC
Start: 2023-10-10 — End: 2023-10-10
  Administered 2023-10-10: 2 mL

## 2023-10-10 NOTE — Patient Instructions (Signed)
 Plan: Wants to have PCP take over Sinemet  meds. I suggested that makes sense so so hard to get out of house   2. Patient here for trigger point injections for  Consent done and on chart.  Cleaned areas with alcohol and injected using a 27 gauge 1.5 inch needle  Injected 9cc- wasted 1cc Using 1% Lidocaine  with no EPI  Upper traps B/L x2 Levators- B/L  Posterior scalenes Middle scalenes B/L  Splenius Capitus- B/L  Pectoralis Major- B/L  Rhomboids- B.L x2 Infraspinatus Teres Major/minor- B/L  Thoracic paraspinals- BL  Lumbar paraspinals Other injections- B/L forearms and R hand   Patient's level of pain prior was Current level of pain after injections is  There was no bleeding or complications.  Patient was advised to drink a lot of water  on day after injections to flush system Will have increased soreness for 12-48 hours after injections.  Can use Lidocaine  patches the day AFTER injections Can use theracane on day of injections in places didn't inject Can use heating pad 4-6 hours AFTER injections  3. steroid injection was performed at RTC/R shoulder R- as well as B/L  epicondyle injection 1/2 dose of Celestone  each side  using 1% plain Lidocaine  and full dose of Celestone  on R shoulder. This was well tolerated.  Cleaned with betadine x3 and allowed to dry- then alcohol then injected using 27 gauge 1.5 inch needle- no bleeding or complications.    4. Infusions could cause many complications- they really want him to do it- but wife and pt cannot handle going 1+ hours and sit for 8 hours + to get infusion- they decided against doing it.   5. I think you should try some prednisone -  wait until sees me back or PCP.    F/U in 3 months for steroid injections of shoulder on R-  Lidocaine  will kick in 15 minutes- and wear off tonight- the steroid will kick in tomorrow within 24 hours and take up to 72 hours to fully kick in.   6. Suggest that Dr Charlott- get him into  Hospice- if at all possible to give family a rest and get things prepared    7. Increase Ibuprofen  800-mg up to 4x/day- try to do no more than 3x/day.   8. F/U - q6 weeks- trp Injections FSH f/u- and 3 months for R shoulder and B/L epicondyle injection

## 2023-10-10 NOTE — Progress Notes (Signed)
 Pt is a 68 yr old male with hx of FSH muscular dystrophy- dx'd in 1992- - also has Parkinson's disease, dilated cardiomyopathy- EF 41%; compression fx s/p fall s/p kyphoplasty at L5; also has disc protrusion Left L5/S1 and L disc protrusion at Left L1-2.   Here for f/u on his FSH muscular dystrophy.  And trigger point injections and R Shoulder/RTC celestone  injection. And Parkinson's diease. And chronic pain. Also here for R trochanteric bursa injection   Doesn't need hip done- got so bad, so went to see Ortho did trochanteric hip injection,    Cannot go to do [facility]- for autoimmune injections. Legs are started locking up.  Bad pain in elbows- L >R elbows- catches- and feels pain-  Hx of draining elbow in past R side hurts from elbow to wrist- as well in Shoulder-    Dr Charlott writes for Dilaudid . 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   Has fallen a lot- since last saw him- at least 6 since last seen- and walks baby steps- and wrapping self around the walker.   Wife and pt are both SOB- and wife overwhelmed physically and mentally.  Got bruised on sternum from flashlight when picked up.   Has tried Celebrex in past- upset stomach-   Exam: TTP over B lateral epicondyle- as well as at Forearm extensor tendons.  Also tight in TrP's-  in neck, shoulders and upper back Mouth breakdown- in past- and mouth ulcers are doing better- said will attack his eyes possibly.  Toes curled under   Plan: Wants to have PCP take over Sinemet  meds. I suggested that makes sense so so hard to get out of house   2. Patient here for trigger point injections for  Consent done and on chart.  Cleaned areas with alcohol and injected using a 27 gauge 1.5 inch needle  Injected 9cc- wasted 1cc Using 1% Lidocaine  with no EPI  Upper traps B/L x2 Levators- B/L  Posterior scalenes Middle scalenes B/L  Splenius Capitus- B/L  Pectoralis Major- B/L   Rhomboids- B.L x2 Infraspinatus Teres Major/minor- B/L  Thoracic paraspinals- BL  Lumbar paraspinals Other injections- B/L forearms and R hand   Patient's level of pain prior was Current level of pain after injections is  There was no bleeding or complications.  Patient was advised to drink a lot of water  on day after injections to flush system Will have increased soreness for 12-48 hours after injections.  Can use Lidocaine  patches the day AFTER injections Can use theracane on day of injections in places didn't inject Can use heating pad 4-6 hours AFTER injections  3. steroid injection was performed at RTC/R shoulder R- as well as B/L  epicondyle injection 1/2 dose of Celestone  each side  using 1% plain Lidocaine  and full dose of Celestone  on R shoulder. This was well tolerated.  Cleaned with betadine x3 and allowed to dry- then alcohol then injected using 27 gauge 1.5 inch needle- no bleeding or complications.    4. Infusions could cause many complications- they really want him to do it- but wife and pt cannot handle going 1+ hours and sit for 8 hours + to get infusion- they decided against doing it.   5. I think you should try some prednisone -  wait until sees me back or PCP.    F/U in 3 months for steroid injections of shoulder on R-  Lidocaine  will kick in 15 minutes- and  wear off tonight- the steroid will kick in tomorrow within 24 hours and take up to 72 hours to fully kick in.   6. Suggest that Dr Charlott- get him into Hospice- if at all possible to give family a rest and get things prepared    7. Increase Ibuprofen  800-mg up to 4x/day- try to do no more than 3x/day.   8. F/U - q6 weeks- trp Injections FSH f/u- and 3 months for R shoulder and B/L epicondyle injection  I spent a total of 48   minutes on total care today- >50% coordination of care- due to injections took 15 minutes total- rest was discussing about prednisone , pain control- and [facility] MD.

## 2023-11-17 ENCOUNTER — Ambulatory Visit: Payer: Medicare Other | Admitting: Physical Medicine and Rehabilitation

## 2023-11-20 ENCOUNTER — Ambulatory Visit: Payer: Medicare Other | Admitting: Neurology

## 2023-11-20 ENCOUNTER — Encounter: Payer: Self-pay | Admitting: Neurology

## 2023-11-20 VITALS — BP 107/68 | HR 68 | Ht 71.0 in

## 2023-11-20 DIAGNOSIS — G20B1 Parkinson's disease with dyskinesia, without mention of fluctuations: Secondary | ICD-10-CM

## 2023-11-20 DIAGNOSIS — G7102 Facioscapulohumeral muscular dystrophy: Secondary | ICD-10-CM

## 2023-11-20 MED ORDER — GABAPENTIN 600 MG PO TABS: 600.0000 mg | ORAL_TABLET | Freq: Four times a day (QID) | ORAL | 4 refills | Status: DC

## 2023-11-20 MED ORDER — CARBIDOPA-LEVODOPA ER 50-200 MG PO TBCR: 1.0000 | EXTENDED_RELEASE_TABLET | Freq: Three times a day (TID) | ORAL | 4 refills | Status: DC

## 2023-11-20 NOTE — Progress Notes (Signed)
 Chief Complaint  Patient presents with   Follow-up    Pt in 14, here with wife [name]  Pt is here for follow up on Parkinson's disease.       ASSESSMENT AND PLAN  Luis Krauser Ranieri Montez Hageman. is a 68 y.o. male   Parkinson's disease  Has been followed by Dr. Anne Hahn in the past, was taking immediate release Sinemet 25/100 mg tablets, but in September 2020 he developed dizziness, lightheadedness, after dosing of immediate release of Sinemet, has been changed to CR, on slow titrating dose, was changed to Sinemet 50/200 CR 1 tablet 3 times a day since September 2022, seems to tolerated better. Keep Sinemet CR 50/200 mg 1 tablet 3 times a day He has slow worsening weakness, gait abnormality, frequent fall, causing a lot of stress for himself and his wife, in palliative care, refilled his prescription,  Chronic severe neuropathic pain and worsening low back pain following his L3 lumbar compression fracture, kyphoplasty  He is also getting Dilaudid 8 mg 4 times a day from his primary care physician Dr. Kevan Ny, planning on to higher dose 10 mg 4 times a day   May keep on current dose of gabapentin 600 mg 3 times a day for baseline, if needed, may take extra, with maximum daily dose of 3600 mg daily  Genetically confirmed FSHD, followed up by Dr. 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 Workup included NCV/EMG and muscle biopsy. 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   Return to clinic for new issues DIAGNOSTIC DATA (LABS, IMAGING, TESTING) - I reviewed patient records, labs, notes, testing and imaging myself where available.   MEDICAL HISTORY:  Luis Symonette Lundberg Montez Hageman., is a 68 year old male, accompanied by his wife, to follow-up for Parkinson's disease, FSH muscular dystrophy, his primary care physician  is Dr. 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 He was clinically diagnosed with FSHD in 49 (68 yo) at [facility]. Workup included NCV/EMG and muscle biopsy. 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  He is followed up by Dr. Baron Sane at [facility] clinic every 6 months  He also had a gradual onset right hand tremor, was diagnosed with Parkinson's disease by Dr. Anne Hahn, he was on regular form of Sinemet 25/100 mg in the past, but developed significant dizziness after each dose, has been switched to current Sinemet CR 50/200 mg 3 times daily, taking it at 8 AM, 4 PM, and bedtime  In addition, he suffered a significant joints, diffuse muscle achy pain, is getting Dilaudid 8 mg 4 times a day from his primary care physician Dr. Marden Noble  Patient and his wife strive to keep him active, their daily routine involving frequent walking short distances walker, standing against the wall,  He has began palliative care who comes to their home. Per the patient this is to decrease the number of visits he has to travel to.  He has continued to have significant pain and weakness of his low back  and LLE since he underwent a nerve ablation at L3-4 performed October 2021.   For mobility, he is continuing to use a walker all the time. He also has a PWC at home, which they report they are working with [employer] to continue to adjust the seat for comfort.   He is using Trilogy machine is working well. He continues to have some shortness of breath, especially with activity. He wears his Trilogy every  night. Will wear it rarely during the day when taking a nap or when symptomatic.   UPDATE August 17th 2023: He is accompanied by his wife at today's visit, continues see [facility] by Dr. Blain Pais every 6 months, Dr. Kevan Ny recently started him on Cymbalta 30 mg bid , it seems to help his pain better  Complains of increased fatigue stiffness since last visit, but there was over all no significant change, he continue to try his best exercise daily, in standing position,  UPDATE November 11 2022: He came in wheelchair, accompanied by his wife at today's visit, he suffered a fall in September, with increased low back pain, leading to hospital admission,  MRI of lumbar May 29, 2022 showed acute compression fracture of L3 with 40% vertebral height loss, compression deformity of L5, status post kyphoplasty, He underwent fluoroscopy guided bilateral transpedicle L3 vertebral body balloon kyphoplasty for osteoporotic fragile fracture,  He had a prolonged rehabilitation, eventually came back home, try to resume his previous activity level, getting fatigued easily, also on polypharmacy for low back pain, planning on taking higher dose of Dilaudid from 8 to 10 mg 4 times a day, already on Cymbalta 30 mg twice a day, Celebrex 200 mg daily as needed, continued Parkinson medicine Sinemet CR 50/201 tablet 3 times a day, also on gabapentin, now is taking 1800 mg daily, was given the prescription up to 3600 mg  With current polypharmacy, he already complains of excessive drowsiness, have to take frequent naps, worry about higher dose of gabapentin may cause increased side effect  UPDATE Sept 12 2024: He is again brought in by his wife at today's clinical visit, apparently very frustrated about his continued decline, getting weaker, still try his best to stand up, but barely able to make few steps, leaning on his walker 45 degree, increased shoulder pain, developed Sacral area decubitus ulcer  Increased  shortness of breath, using ventilator at night, prescription is from [facility] Dr. Viann Shove,  He was diagnosed with Ocular cicatricial pemphigoid (OCP) now develop oral ulcer was diagnosed with benign mucous membrane pemphigoid (BMMP) is a rare, chronic autoimmune disease that causes blistering and scarring of the skin and mucous membranes  UPDATE November 20 2023: He is in palliative care now, wife complains of excessive stress, she is the only caregiver at home, patient remain active, walk up and down in his kitchen hallway, but fell few times, has to call fire department to help him up,  He complains of worsening weakness, more on the right side, stiff, also dealing with autoimmune disease with oral ulcer, under reasonable control now,   PHYSICAL EXAM:   Vitals:   11/20/23 1024  BP: 107/68  Pulse: 68  Height: 5\' 11"  (1.803 m)    PHYSICAL EXAMNIATION:  Gen: NAD, conversant, well nourised, well groomed                     Cardiovascular: Regular rate rhythm, no peripheral edema, warm, nontender. Eyes: Conjunctivae clear without exudates or hemorrhage Neck: Supple,  no carotid bruits. Pulmonary: Decreased air movement at the left lung base  NEUROLOGICAL EXAM:  MENTAL STATUS: Speech/cognition: Awake, alert, oriented to history taking and casual conversation,  slurred speech, moderate to severe facial weakness   CRANIAL NERVES: CN II: Visual fields are full to confrontation. Pupils are round equal and briskly reactive to light. CN III, IV, VI: extraocular movement are normal. No ptosis. CN V: Facial sensation is intact to light touch CN VII:  severe bilateral eye closure, cheek puff weakness, flaccid facial muscle CN VIII: Hearing is normal to causal conversation. CN IX, X: Phonation is slurred CN XI: Head turning and shoulder shrug are intact  MOTOR: Significant atrophy of bilateral proximal arm muscles, especially bilateral deltoid, biceps, barely antigravity movement of  bilateral proximal upper extremity muscles, left slightly weaker than the right, fairly normal bilateral handgrip, wrist extension, wrist flexion  Barely antigravity movement of bilateral lower extremity muscles, bilateral hip flexion, knee extension, knee flexion, trace movement of bilateral ankle dorsiflexion,  REFLEXES: Reflexes are areflexia  SENSORY: Intact to light touch,  COORDINATION: There is no trunk or limb dysmetria noted.  GAIT/STANCE: Deferred  REVIEW OF SYSTEMS:  Full 14 system review of systems performed and notable only for as above All other review of systems were negative.   ALLERGIES: Allergies  Allergen Reactions   Celebrex [Celecoxib]     Burning sensation   Cymbalta [Duloxetine Hcl]     No relief    HOME MEDICATIONS: Current Outpatient Medications  Medication Sig Dispense Refill   carbidopa-levodopa (SINEMET CR) 50-200 MG tablet Take 1 tablet by mouth in the morning, at noon, and at bedtime. 270 tablet 4   diazepam (VALIUM) 5 MG tablet Take 2 tablets (10 mg total) by mouth 2 (two) times daily as needed for anxiety. 20 tablet 0   diclofenac Sodium (VOLTAREN) 1 % GEL Apply 2 g topically 4 (four) times daily. 2 g 0   gabapentin (NEURONTIN) 600 MG tablet Take 1 tablet (600 mg total) by mouth 4 (four) times daily. For nerve pain 360 tablet 4   HYDROmorphone (DILAUDID) 8 MG tablet Take 10 mg by mouth in the morning, at noon, in the evening, and at bedtime.     ibuprofen (ADVIL) 800 MG tablet Take 1 tablet (800 mg total) by mouth every 8 (eight) hours as needed. 120 tablet 5   polyethylene glycol (MIRALAX / GLYCOLAX) 17 g packet Take 17 g by mouth at bedtime.     prednisoLONE acetate (PRED FORTE) 1 % ophthalmic suspension Place 1 drop into both eyes every other day.  0   timolol (BETIMOL) 0.5 % ophthalmic solution Place 1 drop into both eyes 2 (two) times daily.     zolpidem (AMBIEN) 10 MG tablet Take 10 mg by mouth at bedtime as needed for sleep.     No  current facility-administered medications for this visit.    PAST MEDICAL HISTORY: Past Medical History:  Diagnosis Date   Bilateral foot-drop 10/19/2018   Bursitis, trochanteric    Episodic   Carpal tunnel syndrome on right    Degenerative disk disease    l5-S1   FSH (facioscapulohumeral muscular dystrophy) (HCC) 10/07/2017   Gait abnormality 10/19/2018   GERD (gastroesophageal reflux disease)    Hemorrhoids    with anal fissures   Left lateral epicondylitis    Parkinson's disease (HCC) 10/19/2018   Skin cancer    right temple area    PAST SURGICAL HISTORY: Past Surgical History:  Procedure Laterality Date  CHOLECYSTECTOMY  2007   HEMORRHOIDECTOMY WITH HEMORRHOID BANDING     IR KYPHO LUMBAR INC FX REDUCE BONE BX UNI/BIL CANNULATION INC/IMAGING  01/10/2020   IR KYPHO LUMBAR INC FX REDUCE BONE BX UNI/BIL CANNULATION INC/IMAGING  05/31/2022    FAMILY HISTORY: Family History  Problem Relation Age of Onset   Allergies Brother    Allergies Sister    Heart disease Father    Brain cancer Mother    Bone cancer Brother     SOCIAL HISTORY: Social History   Socioeconomic History   Marital status: Married    Spouse name: Andrey Campanile   Number of children: 0   Years of education: Not on file   Highest education level: Not on file  Occupational History   Occupation: Programmer, systems [employer]  Tobacco Use   Smoking status: Never   Smokeless tobacco: Never  Vaping Use   Vaping status: Never Used  Substance and Sexual Activity   Alcohol use: No   Drug use: No   Sexual activity: Not on file  Other Topics Concern   Not on file  Social History Narrative   Lives with wife   Caffeine use: Sometimes tea   Right handed    Social Drivers of Health   Financial Resource Strain: Not on file  Food Insecurity: No Food Insecurity (05/27/2022)   Hunger Vital Sign    Worried About Running Out of Food in the Last Year: Never true    Ran Out of Food in the Last Year: Never  true  Transportation Needs: No Transportation Needs (05/27/2022)   PRAPARE - Administrator, Civil Service (Medical): No    Lack of Transportation (Non-Medical): No  Physical Activity: Not on file  Stress: Not on file  Social Connections: Not on file  Intimate Partner Violence: Not At Risk (05/27/2022)   Humiliation, Afraid, Rape, and Kick questionnaire    Fear of Current or Ex-Partner: No    Emotionally Abused: No    Physically Abused: No    Sexually Abused: No    Levert Feinstein, M.D. Ph.D.  [facility] [address], [address] [place], [place] [address] Ph: [phone] Fax: [phone]  CC:  Emilio Aspen, MD [address] [place],  [place] [address]  Emilio Aspen, MD

## 2023-12-08 ENCOUNTER — Encounter: Payer: Medicare Other | Admitting: Physical Medicine and Rehabilitation

## 2023-12-13 ENCOUNTER — Emergency Department (HOSPITAL_COMMUNITY)

## 2023-12-13 ENCOUNTER — Inpatient Hospital Stay (HOSPITAL_COMMUNITY)
Admission: EM | Admit: 2023-12-13 | Discharge: 2023-12-22 | DRG: 522 | Disposition: A | Discharge status: Skilled Nursing Facility | Attending: Internal Medicine | Admitting: Internal Medicine

## 2023-12-13 ENCOUNTER — Encounter (HOSPITAL_COMMUNITY): Payer: Self-pay

## 2023-12-13 ENCOUNTER — Other Ambulatory Visit: Payer: Self-pay

## 2023-12-13 DIAGNOSIS — M48062 Spinal stenosis, lumbar region with neurogenic claudication: Secondary | ICD-10-CM | POA: Diagnosis present

## 2023-12-13 DIAGNOSIS — M21372 Foot drop, left foot: Secondary | ICD-10-CM | POA: Diagnosis present

## 2023-12-13 DIAGNOSIS — W1839XA Other fall on same level, initial encounter: Secondary | ICD-10-CM | POA: Diagnosis present

## 2023-12-13 DIAGNOSIS — Z7982 Long term (current) use of aspirin: Secondary | ICD-10-CM | POA: Diagnosis not present

## 2023-12-13 DIAGNOSIS — Z808 Family history of malignant neoplasm of other organs or systems: Secondary | ICD-10-CM | POA: Diagnosis not present

## 2023-12-13 DIAGNOSIS — Z888 Allergy status to other drugs, medicaments and biological substances status: Secondary | ICD-10-CM

## 2023-12-13 DIAGNOSIS — S72001A Fracture of unspecified part of neck of right femur, initial encounter for closed fracture: Secondary | ICD-10-CM | POA: Diagnosis not present

## 2023-12-13 DIAGNOSIS — Z85828 Personal history of other malignant neoplasm of skin: Secondary | ICD-10-CM

## 2023-12-13 DIAGNOSIS — E785 Hyperlipidemia, unspecified: Secondary | ICD-10-CM | POA: Diagnosis present

## 2023-12-13 DIAGNOSIS — W19XXXA Unspecified fall, initial encounter: Principal | ICD-10-CM

## 2023-12-13 DIAGNOSIS — G8929 Other chronic pain: Secondary | ICD-10-CM | POA: Diagnosis present

## 2023-12-13 DIAGNOSIS — G20B2 Parkinson's disease with dyskinesia, with fluctuations: Secondary | ICD-10-CM | POA: Diagnosis present

## 2023-12-13 DIAGNOSIS — D72829 Elevated white blood cell count, unspecified: Secondary | ICD-10-CM | POA: Diagnosis present

## 2023-12-13 DIAGNOSIS — Z66 Do not resuscitate: Secondary | ICD-10-CM | POA: Diagnosis present

## 2023-12-13 DIAGNOSIS — Y92092 Bedroom in other non-institutional residence as the place of occurrence of the external cause: Secondary | ICD-10-CM | POA: Diagnosis not present

## 2023-12-13 DIAGNOSIS — G20A1 Parkinson's disease without dyskinesia, without mention of fluctuations: Secondary | ICD-10-CM | POA: Diagnosis present

## 2023-12-13 DIAGNOSIS — G7102 Facioscapulohumeral muscular dystrophy: Secondary | ICD-10-CM | POA: Diagnosis present

## 2023-12-13 DIAGNOSIS — R471 Dysarthria and anarthria: Secondary | ICD-10-CM | POA: Diagnosis present

## 2023-12-13 DIAGNOSIS — Z9181 History of falling: Secondary | ICD-10-CM | POA: Diagnosis not present

## 2023-12-13 DIAGNOSIS — G629 Polyneuropathy, unspecified: Secondary | ICD-10-CM | POA: Diagnosis present

## 2023-12-13 DIAGNOSIS — K219 Gastro-esophageal reflux disease without esophagitis: Secondary | ICD-10-CM | POA: Diagnosis present

## 2023-12-13 DIAGNOSIS — Z9049 Acquired absence of other specified parts of digestive tract: Secondary | ICD-10-CM

## 2023-12-13 DIAGNOSIS — Z8249 Family history of ischemic heart disease and other diseases of the circulatory system: Secondary | ICD-10-CM | POA: Diagnosis not present

## 2023-12-13 DIAGNOSIS — K5900 Constipation, unspecified: Secondary | ICD-10-CM | POA: Diagnosis not present

## 2023-12-13 DIAGNOSIS — Z79899 Other long term (current) drug therapy: Secondary | ICD-10-CM

## 2023-12-13 DIAGNOSIS — G4733 Obstructive sleep apnea (adult) (pediatric): Secondary | ICD-10-CM | POA: Diagnosis present

## 2023-12-13 DIAGNOSIS — M21371 Foot drop, right foot: Secondary | ICD-10-CM | POA: Diagnosis present

## 2023-12-13 DIAGNOSIS — M51379 Other intervertebral disc degeneration, lumbosacral region without mention of lumbar back pain or lower extremity pain: Secondary | ICD-10-CM | POA: Diagnosis present

## 2023-12-13 DIAGNOSIS — Y9301 Activity, walking, marching and hiking: Secondary | ICD-10-CM | POA: Diagnosis present

## 2023-12-13 DIAGNOSIS — Z7409 Other reduced mobility: Secondary | ICD-10-CM | POA: Diagnosis present

## 2023-12-13 DIAGNOSIS — Z9889 Other specified postprocedural states: Secondary | ICD-10-CM

## 2023-12-13 DIAGNOSIS — Z8719 Personal history of other diseases of the digestive system: Secondary | ICD-10-CM

## 2023-12-13 DIAGNOSIS — Z886 Allergy status to analgesic agent status: Secondary | ICD-10-CM

## 2023-12-13 LAB — BASIC METABOLIC PANEL WITH GFR
Anion gap: 16 — ABNORMAL HIGH (ref 5–15)
BUN: 9 mg/dL (ref 8–23)
CO2: 24 mmol/L (ref 22–32)
Calcium: 9 mg/dL (ref 8.9–10.3)
Chloride: 98 mmol/L (ref 98–111)
Creatinine, Ser: 0.39 mg/dL — ABNORMAL LOW (ref 0.61–1.24)
GFR, Estimated: >60 mL/min (ref >60)
Glucose, Bld: 119 mg/dL — ABNORMAL HIGH (ref 70–99)
Potassium: 3.7 mmol/L (ref 3.5–5.1)
Sodium: 138 mmol/L (ref 135–145)

## 2023-12-13 LAB — CBC WITH DIFFERENTIAL/PLATELET
Abs Immature Granulocytes: 0.06 10*3/uL (ref 0.00–0.07)
Basophils Absolute: 0 10*3/uL (ref 0.0–0.1)
Basophils Relative: 0 %
Eosinophils Absolute: 0 10*3/uL (ref 0.0–0.5)
Eosinophils Relative: 0 %
HCT: 49.4 % (ref 39.0–52.0)
Hemoglobin: 16.4 g/dL (ref 13.0–17.0)
Immature Granulocytes: 0 %
Lymphocytes Relative: 3 %
Lymphs Abs: 0.4 10*3/uL — ABNORMAL LOW (ref 0.7–4.0)
MCH: 31.9 pg (ref 26.0–34.0)
MCHC: 33.2 g/dL (ref 30.0–36.0)
MCV: 96.1 fL (ref 80.0–100.0)
Monocytes Absolute: 0.5 10*3/uL (ref 0.1–1.0)
Monocytes Relative: 3 %
Neutro Abs: 15.1 10*3/uL — ABNORMAL HIGH (ref 1.7–7.7)
Neutrophils Relative %: 94 %
Platelets: 163 10*3/uL (ref 150–400)
RBC: 5.14 MIL/uL (ref 4.22–5.81)
RDW: 12.7 % (ref 11.5–15.5)
WBC: 16.1 10*3/uL — ABNORMAL HIGH (ref 4.0–10.5)
nRBC: 0 % (ref 0.0–0.2)

## 2023-12-13 LAB — TYPE AND SCREEN
ABO/RH(D): O POS
Antibody Screen: NEGATIVE

## 2023-12-13 LAB — PROTIME-INR
INR: 1.1 (ref 0.8–1.2)
Prothrombin Time: 14.1 s (ref 11.4–15.2)

## 2023-12-13 LAB — SURGICAL PCR SCREEN
MRSA, PCR: NEGATIVE
Staphylococcus aureus: NEGATIVE

## 2023-12-13 LAB — HIV ANTIBODY (ROUTINE TESTING W REFLEX): HIV Screen 4th Generation wRfx: NONREACTIVE

## 2023-12-13 MED ORDER — HYDROMORPHONE HCL 1 MG/ML IJ SOLN
0.5000 mg | INTRAMUSCULAR | Status: DC | PRN
  Administered 2023-12-13 – 2023-12-16 (×5): 1 mg via INTRAVENOUS
  Filled 2023-12-13 (×5): qty 1

## 2023-12-13 MED ORDER — HYDROMORPHONE HCL 1 MG/ML IJ SOLN
1.0000 mg | Freq: Once | INTRAMUSCULAR | Status: AC
  Administered 2023-12-13: 1 mg via INTRAVENOUS
  Filled 2023-12-13: qty 1

## 2023-12-13 MED ORDER — HYDROCODONE-ACETAMINOPHEN 5-325 MG PO TABS
1.0000 | ORAL_TABLET | Freq: Four times a day (QID) | ORAL | Status: DC | PRN
  Administered 2023-12-13: 2 via ORAL
  Administered 2023-12-14 – 2023-12-15 (×3): 1 via ORAL
  Administered 2023-12-15 – 2023-12-16 (×2): 2 via ORAL
  Filled 2023-12-13: qty 1
  Filled 2023-12-13: qty 2
  Filled 2023-12-13: qty 1
  Filled 2023-12-13: qty 2
  Filled 2023-12-13: qty 1
  Filled 2023-12-13: qty 2

## 2023-12-13 MED ORDER — GABAPENTIN 300 MG PO CAPS
600.0000 mg | ORAL_CAPSULE | Freq: Four times a day (QID) | ORAL | Status: DC
  Administered 2023-12-13 – 2023-12-22 (×24): 600 mg via ORAL
  Filled 2023-12-13 (×32): qty 2

## 2023-12-13 MED ORDER — HYDROMORPHONE HCL 1 MG/ML IJ SOLN
1.0000 mg | INTRAMUSCULAR | Status: DC | PRN
  Administered 2023-12-13: 1 mg via INTRAVENOUS
  Filled 2023-12-13: qty 1

## 2023-12-13 MED ORDER — CARBIDOPA-LEVODOPA ER 50-200 MG PO TBCR
1.0000 | EXTENDED_RELEASE_TABLET | Freq: Once | ORAL | Status: AC
  Administered 2023-12-14: 1 via ORAL
  Filled 2023-12-13: qty 1

## 2023-12-13 MED ORDER — TRANEXAMIC ACID-NACL 1000-0.7 MG/100ML-% IV SOLN
1000.0000 mg | Freq: Once | INTRAVENOUS | Status: AC
  Administered 2023-12-13: 1000 mg via INTRAVENOUS
  Filled 2023-12-13: qty 100

## 2023-12-13 MED ORDER — ONDANSETRON HCL 4 MG/2ML IJ SOLN
4.0000 mg | Freq: Once | INTRAMUSCULAR | Status: AC
  Administered 2023-12-13: 4 mg via INTRAVENOUS
  Filled 2023-12-13: qty 2

## 2023-12-13 MED ORDER — FENTANYL CITRATE PF 50 MCG/ML IJ SOSY: 50.0000 ug | PREFILLED_SYRINGE | INTRAMUSCULAR | Status: DC | PRN

## 2023-12-13 MED ORDER — CARBIDOPA-LEVODOPA ER 50-200 MG PO TBCR
1.0000 | EXTENDED_RELEASE_TABLET | Freq: Three times a day (TID) | ORAL | Status: DC
  Administered 2023-12-13 – 2023-12-22 (×24): 1 via ORAL
  Filled 2023-12-13 (×29): qty 1

## 2023-12-13 MED ORDER — POLYETHYLENE GLYCOL 3350 17 G PO PACK
17.0000 g | PACK | Freq: Every day | ORAL | Status: DC | PRN
  Administered 2023-12-20 – 2023-12-22 (×2): 17 g via ORAL
  Filled 2023-12-13 (×4): qty 1

## 2023-12-13 MED ORDER — SODIUM CHLORIDE 0.9 % IV SOLN: INTRAVENOUS | Status: AC

## 2023-12-13 MED ORDER — CEFAZOLIN SODIUM-DEXTROSE 2-4 GM/100ML-% IV SOLN
2.0000 g | INTRAVENOUS | Status: AC
  Administered 2023-12-14: 2 g via INTRAVENOUS
  Filled 2023-12-13: qty 100

## 2023-12-13 MED ORDER — HYDROMORPHONE HCL 2 MG PO TABS
8.0000 mg | ORAL_TABLET | Freq: Once | ORAL | Status: AC
  Administered 2023-12-13: 8 mg via ORAL
  Filled 2023-12-13: qty 4

## 2023-12-13 MED ORDER — DOXYCYCLINE HYCLATE 100 MG PO TABS
100.0000 mg | ORAL_TABLET | Freq: Once | ORAL | Status: AC
  Administered 2023-12-13: 100 mg via ORAL
  Filled 2023-12-13: qty 1

## 2023-12-13 NOTE — H&P (Signed)
 History and Physical   Luis [name]. ZOX:[id number] DOB: Oct 21, 1955 DOA: 12/13/2023  PCP: Benedetta Bradley, MD   Patient coming from: Home  Chief Complaint: Falls, hip pain  HPI: Luis [name] Luis [name]. is a 68 y.o. male with medical history significant of hyperlipidemia, GERD, FSH muscular dystrophy, Parkinson disease, spinal stenosis, chronic pain, OSA presenting after falls at home and hip pain.  Patient reportedly first fell last night while walking to the bathroom with his walker.  Had significant pain presents.  Today he fell again when he was unable to bear weight getting out of bed.  His family reported he did hit his head during his fall.  Patient states he was unable to take his morning medications.   Denies fevers, chills, chest pain, shortness breath, abdominal pain, constipation, diarrhea, nausea, vomiting.   ED Course: Vital signs in the ED notable for blood pressure in the 170s systolic.  Lab workup included BMP with glucose 119.  CBC with leukocytosis to 16.1.  PT and INR normal.  Urinalysis pending.  Type and screen performed.  Chest x-ray showed no acute abnormality.  Hip X-ray on the right showed right femoral neck fracture with varus angulation.  CT head and CT right hip pending.  Patient received Dilaudid x 3 in the ED.  Also received doxycycline, Ancef, Zofran, Sinemet.  Orthopedic consulted and plan for surgical intervention tomorrow, 4/13.  Review of Systems: As per HPI otherwise all other systems reviewed and are negative.  Past Medical History:  Diagnosis Date   Bilateral foot-drop 10/19/2018   Bursitis, trochanteric    Episodic   Carpal tunnel syndrome on right    Degenerative disk disease    l5-S1   FSH (facioscapulohumeral muscular dystrophy) (HCC) 10/07/2017   Gait abnormality 10/19/2018   GERD (gastroesophageal reflux disease)    Hemorrhoids    with anal fissures   Left lateral epicondylitis    Parkinson's disease (HCC) 10/19/2018   Skin cancer     right temple area    Past Surgical History:  Procedure Laterality Date   CHOLECYSTECTOMY  2007   HEMORRHOIDECTOMY WITH HEMORRHOID BANDING     IR KYPHO LUMBAR INC FX REDUCE BONE BX UNI/BIL CANNULATION INC/IMAGING  01/10/2020   IR KYPHO LUMBAR INC FX REDUCE BONE BX UNI/BIL CANNULATION INC/IMAGING  05/31/2022    Social History  reports that he has never smoked. He has never used smokeless tobacco. He reports that he does not drink alcohol and does not use drugs.  Allergies  Allergen Reactions   Celebrex [Celecoxib]     Burning sensation   Cymbalta [Duloxetine Hcl]     No relief    Family History  Problem Relation Age of Onset   Allergies Brother    Allergies Sister    Heart disease Father    Brain cancer Mother    Bone cancer Brother   Reviewed on admission  Prior to Admission medications   Medication Sig Start Date End Date Taking? Authorizing Provider  carbidopa-levodopa (SINEMET CR) 50-200 MG tablet Take 1 tablet by mouth in the morning, at noon, and at bedtime. 11/20/23   Phebe Brasil, MD  diazepam (VALIUM) 5 MG tablet Take 2 tablets (10 mg total) by mouth 2 (two) times daily as needed for anxiety. 06/11/22   Angiulli, Everlyn Hockey, PA-C  diclofenac Sodium (VOLTAREN) 1 % GEL Apply 2 g topically 4 (four) times daily. 06/11/22   Angiulli, Everlyn Hockey, PA-C  gabapentin (NEURONTIN) 600 MG tablet Take 1 tablet (  600 mg total) by mouth 4 (four) times daily. For nerve pain 11/20/23   Phebe Brasil, MD  HYDROmorphone (DILAUDID) 8 MG tablet Take 10 mg by mouth in the morning, at noon, in the evening, and at bedtime. 11/16/20   [provider]  ibuprofen (ADVIL) 800 MG tablet Take 1 tablet (800 mg total) by mouth every 8 (eight) hours as needed. 10/10/23   Lovorn, Megan, MD  polyethylene glycol (MIRALAX / GLYCOLAX) 17 g packet Take 17 g by mouth at bedtime.    [provider]  prednisoLONE acetate (PRED FORTE) 1 % ophthalmic suspension Place 1 drop into both eyes every other day. 03/04/18    [provider]  timolol (BETIMOL) 0.5 % ophthalmic solution Place 1 drop into both eyes 2 (two) times daily.    [provider]  zolpidem (AMBIEN) 10 MG tablet Take 10 mg by mouth at bedtime as needed for sleep.    [provider]    Physical Exam: Vitals:   12/13/23 1311 12/13/23 1514  BP: (!) 177/102 (!) 160/93  Pulse: 77 79  Resp: 18 16  Temp: 98.7 F (37.1 C)   TempSrc: Temporal   SpO2: 99% 99%    Physical Exam Constitutional:      General: He is not in acute distress.    Appearance: Normal appearance.  HENT:     Head: Normocephalic and atraumatic.     Mouth/Throat:     Mouth: Mucous membranes are moist.     Pharynx: Oropharynx is clear.  Eyes:     Extraocular Movements: Extraocular movements intact.     Pupils: Pupils are equal, round, and reactive to light.  Cardiovascular:     Rate and Rhythm: Normal rate and regular rhythm.     Pulses: Normal pulses.     Heart sounds: Normal heart sounds.  Pulmonary:     Effort: Pulmonary effort is normal. No respiratory distress.     Breath sounds: Normal breath sounds.  Abdominal:     General: Bowel sounds are normal. There is no distension.     Palpations: Abdomen is soft.     Tenderness: There is no abdominal tenderness.  Musculoskeletal:        General: No swelling or deformity.     Comments: Bilateral lower extremities neurovascularly intact. Right lower extremity foreshortened with external rotation.  Skin:    General: Skin is warm and dry.  Neurological:     General: No focal deficit present.     Mental Status: Mental status is at baseline.    Labs on Admission: I have personally reviewed following labs and imaging studies  CBC: Recent Labs  Lab 12/13/23 1350  WBC 16.1*  NEUTROABS 15.1*  HGB 16.4  HCT 49.4  MCV 96.1  PLT 163    Basic Metabolic Panel: Recent Labs  Lab 12/13/23 1350  NA 138  K 3.7  CL 98  CO2 24  GLUCOSE 119*  BUN 9  CREATININE 0.39*  CALCIUM 9.0     GFR: CrCl cannot be calculated (Unknown ideal weight.).  Liver Function Tests: No results for input(s): "AST", "ALT", "ALKPHOS", "BILITOT", "PROT", "ALBUMIN" in the last 168 hours.  Urine analysis:    Component Value Date/Time   COLORURINE YELLOW 05/27/2022 1206   APPEARANCEUR CLEAR 05/27/2022 1206   LABSPEC 1.023 05/27/2022 1206   PHURINE 5.0 05/27/2022 1206   GLUCOSEU NEGATIVE 05/27/2022 1206   HGBUR SMALL (A) 05/27/2022 1206   BILIRUBINUR NEGATIVE 05/27/2022 1206   KETONESUR 5 (  A) 05/27/2022 1206   PROTEINUR NEGATIVE 05/27/2022 1206   NITRITE NEGATIVE 05/27/2022 1206   LEUKOCYTESUR NEGATIVE 05/27/2022 1206    Radiological Exams on Admission: DG Chest 1 View Result Date: 12/13/2023 CLINICAL DATA:  Preoperative respiratory evaluation. EXAM: CHEST  1 VIEW COMPARISON:  03/04/2019 FINDINGS: Stable asymmetric elevation left hemidiaphragm. Cardiopericardial silhouette is at upper limits of normal for size. The lungs are clear without focal pneumonia, edema, pneumothorax or pleural effusion. No acute bony abnormality. IMPRESSION: Stable.  No acute findings. Electronically Signed   By: Donnal Fusi M.D.   On: 12/13/2023 14:04   DG Hip Unilat With Pelvis 2-3 Views Right Result Date: 12/13/2023 CLINICAL DATA:  Pain after a fall. EXAM: DG HIP (WITH OR WITHOUT PELVIS) 2-3V RIGHT COMPARISON:  06/29/2020 FINDINGS: Bones are diffusely demineralized. SI joints and symphysis pubis unremarkable. Deformity of the left inferior pubic ramus suggests old trauma. AP and cross-table lateral views of the right hip show a right femoral neck fracture with varus angulation. IMPRESSION: Right femoral neck fracture with varus angulation. Electronically Signed   By: Donnal Fusi M.D.   On: 12/13/2023 14:04   EKG: Independently reviewed. Normal sinus rhythm at 76 bpm.  Nonspecific T wave changes.  Some artifact leads I, 2, aVR, aVL, V1.  Assessment/Plan Principal Problem:   Fracture of femoral neck,  right, closed (HCC) Active Problems:   FSH (facioscapulohumeral muscular dystrophy) (HCC)   Parkinson's disease (HCC)   Other chronic pain   Spinal stenosis of lumbar region with neurogenic claudication   Gastro-esophageal reflux disease without esophagitis   Hyperlipidemia   Right femoral neck fracture > Fell when ambulating with his walker to the bathroom yesterday.  Marvell Slider again when unable to bear weight when getting out of bed this morning. > Hip pain ever since first fall.  X-ray confirming right femoral neck fracture. > Orthopedics has seen the patient and plan for surgery 4/13. - Monitor on telemetry overnight - Appreciate orthopedic surgery recommendations and assistance - Hip fracture protocol - As needed Norco for moderate pain, as needed Dilaudid for severe/severe breakthrough pain - Consult to anesthesia for nerve block - Tranexamic acid per orthopedic surgery - Supportive care - N.p.o. midnight  Leukocytosis > Leukocytosis 16.1.  Presumed reactive in the setting of falls and hip fracture.  Afebrile.  No evidence of infection on chest x-ray.  Urinalysis pending. - Follow-up urinalysis - Trend fever curve and WBC  Hyperlipidemia - Not on any antilipid hemic  GERD - Not on acid blocker  Parkinson disease - Continue home Sinemet  Spinal stenosis FSH muscular dystrophy Chronic pain - Noted - On pain medication as above  OSA - Most recent notes indicate not tolerating CPAP  DVT prophylaxis: SCDs for now Code Status:   Full  Family Communication:  Updated at bedside  Disposition Plan:   Patient is from:  Home  Anticipated DC to:  Home  Anticipated DC date:  2 to 4 days  Anticipated DC barriers: None  Consults called:  Orthopedic surgery Admission status:  Inpatient, telemetry  Severity of Illness: The appropriate patient status for this patient is INPATIENT. Inpatient status is judged to be reasonable and necessary in order to provide the required  intensity of service to ensure the patient's safety. The patient's presenting symptoms, physical exam findings, and initial radiographic and laboratory data in the context of their chronic comorbidities is felt to place them at high risk for further clinical deterioration. Furthermore, it is not anticipated that the patient  will be medically stable for discharge from the hospital within 2 midnights of admission.   * I certify that at the point of admission it is my clinical judgment that the patient will require inpatient hospital care spanning beyond 2 midnights from the point of admission due to high intensity of service, high risk for further deterioration and high frequency of surveillance required.Johnetta Nab MD [facility]  How to contact the [facility] Attending or Consulting provider 7A - 7P or covering provider during after hours 7P -7A, for this patient?   Check the care team in [facility] and look for a) attending/consulting [facility] provider listed and b) the [facility] team listed Log into [email or web address] and use 's universal password to access. If you do not have the password, please contact the hospital operator. Locate the [facility] provider you are looking for under [facility] and page to a number that you can be directly reached. If you still have difficulty reaching the provider, please page the [facility] (Director on Call) for the Hospitalists listed on amion for assistance.  12/13/2023, 3:19 PM

## 2023-12-13 NOTE — Consult Note (Addendum)
 Orthopedic Surgery Consult Note  Assessment: Patient is a 68 y.o. male with right, displaced femoral neck fracture   Plan: -Planning for operative management tomorrow -Diet: NPO at midnight -DVT ppx: aspirin 81mg  BID post-operatively -Ancef and TXA on call to OR -Weight bearing status: NWB RLE -PT evaluate and treat post-operatively -Pain control -Dispo: pending completion of operative plans   Discussed recommendation for operative intervention in the form of right hip cemented hemiarthroplasty. Explained the risks of this procedure included, but were not limited to: hip dislocation, leg length discrepancy, neurovascular injury, infection, bleeding, stiffness, acetabulum erosion over time, need for additional procedures, deep vein thrombosis, pulmonary embolism, and death. The benefits of this procedure would be to allow for early ambulation and mobilization. The alternatives of this surgery would be to treat the fracture with immobilization in traction or to do no intervention. The patient's questions were answered to his satisfaction. After this discussion, patient elected to proceed with surgery. Informed consent was obtained.   ___________________________________________________________________________   Reason for consult: right femoral neck fracture  History:  Patient is a 68 y.o. male who uses a walker at baseline and ambulates mostly short distances (mostly around the house only) came to the ER today with right hip pain. He was going to the bathroom last night and fell to the ground. He noted immediate onset of right hip pain. He has been unable to bear weight since that time and decided to come to the ER today. He was found to have a right femoral neck fracture. He was admitted to medicine. He is reporting right hip pain. He does not have pain anywhere else. Denies paresthesias and numbness.   Of note, has bilateral foot drop which he has had for several years.   Past medical  history:  Bilateral foot drop  Parkinsons disease Fascioscapulohumeral muscular dystrophy GERD Hemorrhoids  Allergies: celebrex, cymbalta   Past surgical history:  Kyphoplasty Cholecystectomy Hemorrhoidectomy   Social history: Denies use of nicotine-containing products (cigarettes, vaping, smokeless, etc.) Alcohol use: denies Denies use of recreational drugs  Family history: -reviewed and not pertinent to femoral neck fracture   Physical Exam:  BMI of 22.0  General: no acute distress, appears stated age Neurologic: alert, answering questions appropriately, following commands Cardiovascular: regular rate, no cyanosis Respiratory: unlabored breathing on room air, symmetric chest rise Psychiatric: appropriate affect, normal cadence to speech  MSK:   -Bilateral upper extremities  No tenderness to palpation over extremity, no gross deformity, no open wounds Fires deltoid, biceps, triceps, wrist extensors, wrist flexors, finger extensors, finger flexors  AIN/PIN/IO intact  Palpable radial pulse  Sensation intact to light touch in median/ulnar/radial/axillary nerve distributions  Hand warm and well perfused  -Left lower extremity  No tenderness to palpation over extremity, no pain with log roll, no gross deformity, no open wounds Fires hip flexors, quadriceps, hamstrings, gastrocnemius and soleus. Does not fire EHL due to chronic foot drop Plantarflexes toes Sensation intact to light touch in sural, saphenous, tibial, deep peroneal, and superficial peroneal nerve distributions Foot warm and well perfused, palpable DP pulse  -Right lower extremity  No tenderness to palpation over extremity, except around the hip. Pain with log roll. Leg short when compared to contralateral side. No open wounds Fires quadriceps, hamstrings, gastrocnemius and soleus. Does not fire hip flexors due to pain. Does not fire EHL due to chronic foot drop Plantarflexes toes Sensation intact to  light touch in sural, saphenous, tibial, deep peroneal, and superficial peroneal nerve distributions Foot warm  and well perfused, palpable DP pulse  Imaging: XRs of the right hip from 12/13/2023 were independently reviewed and interpreted, showing a displaced femoral neck fracture. There is shortening through the fracture and it is in varus alignment. No other fractures seen. No dislocation seen.    Patient name: Luis E Mckethan Jr. Patient MRN: [id number] Date: 12/13/23  Pre-operative Scores  -VAS leg: 8/10

## 2023-12-13 NOTE — ED Provider Notes (Signed)
 [place] EMERGENCY DEPARTMENT AT [facility] Provider Note   CSN: [id number] Arrival date & time: 12/13/23  1301     History  Chief Complaint  Patient presents with   Luis [name]. is a 68 y.o. male.  Patient is a 68 year old male with a history of Parkinson's disease who uses a walker to get around as well as muscular dystrophy and GERD who is presenting today with persistent right hip pain after a fall.  He reports that last night he was using his walker and going to the bathroom when he fell.  Since the fall he has had significant pain in his right hip.  Today he fell again when he tried to get out of bed but could not bear any weight.  He denies hitting his head or losing consciousness.  He took all of his medications yesterday but has not had any medications this morning.  He has not had recent cough, congestion, chest pain or shortness of breath.  He denies any urinary symptoms.  He reports otherwise feeling well except his right hip is just killing him and he cannot move it.  No numbness or tingling in the leg.        Home Medications Prior to Admission medications   Medication Sig Start Date End Date Taking? Authorizing Provider  carbidopa-levodopa (SINEMET CR) 50-200 MG tablet Take 1 tablet by mouth in the morning, at noon, and at bedtime. 11/20/23   Levert Feinstein, MD  diazepam (VALIUM) 5 MG tablet Take 2 tablets (10 mg total) by mouth 2 (two) times daily as needed for anxiety. 06/11/22   Angiulli, Mcarthur Rossetti, PA-C  diclofenac Sodium (VOLTAREN) 1 % GEL Apply 2 g topically 4 (four) times daily. 06/11/22   Angiulli, Mcarthur Rossetti, PA-C  gabapentin (NEURONTIN) 600 MG tablet Take 1 tablet (600 mg total) by mouth 4 (four) times daily. For nerve pain 11/20/23   Levert Feinstein, MD  HYDROmorphone (DILAUDID) 8 MG tablet Take 10 mg by mouth in the morning, at noon, in the evening, and at bedtime. 11/16/20   [provider]  ibuprofen (ADVIL) 800 MG tablet Take 1 tablet  (800 mg total) by mouth every 8 (eight) hours as needed. 10/10/23   Lovorn, Aundra Millet, MD  polyethylene glycol (MIRALAX / GLYCOLAX) 17 g packet Take 17 g by mouth at bedtime.    [provider]  prednisoLONE acetate (PRED FORTE) 1 % ophthalmic suspension Place 1 drop into both eyes every other day. 03/04/18   [provider]  timolol (BETIMOL) 0.5 % ophthalmic solution Place 1 drop into both eyes 2 (two) times daily.    [provider]  zolpidem (AMBIEN) 10 MG tablet Take 10 mg by mouth at bedtime as needed for sleep.    [provider]      Allergies    Celebrex [celecoxib] and Cymbalta [duloxetine hcl]    Review of Systems   Review of Systems  Physical Exam Updated Vital Signs BP (!) 177/102   Pulse 77   Temp 98.7 F (37.1 C) (Temporal)   Resp 18   SpO2 99%  Physical Exam Vitals and nursing note reviewed.  Constitutional:      General: He is not in acute distress.    Appearance: He is well-developed.  HENT:     Head: Normocephalic and atraumatic.  Eyes:     Conjunctiva/sclera: Conjunctivae normal.     Pupils: Pupils are equal, round, and reactive to light.  Cardiovascular:     Rate and Rhythm: Normal rate and regular rhythm.     Heart sounds: No murmur heard. Pulmonary:     Effort: Pulmonary effort is normal. No respiratory distress.     Breath sounds: Normal breath sounds. No wheezing or rales.  Abdominal:     General: There is no distension.     Palpations: Abdomen is soft.     Tenderness: There is no abdominal tenderness. There is no guarding or rebound.  Musculoskeletal:     Cervical back: Normal range of motion and neck supple.     Right hip: Deformity, tenderness and bony tenderness present. Decreased range of motion.     Comments: Leg is shortened and externally rotated on the right.  1+ DP pulse present in bilateral lower extremities.  Skin:    General: Skin is warm and dry.     Findings: No erythema or rash.  Neurological:      Mental Status: He is alert and oriented to person, place, and time.     Comments: Resting tremor noted most significantly in the right hand  Psychiatric:        Behavior: Behavior normal.     ED Results / Procedures / Treatments   Labs (all labs ordered are listed, but only abnormal results are displayed) Labs Reviewed  CBC WITH DIFFERENTIAL/PLATELET - Abnormal; Notable for the following components:      Result Value   WBC 16.1 (*)    Neutro Abs 15.1 (*)    Lymphs Abs 0.4 (*)    All other components within normal limits  PROTIME-INR  BASIC METABOLIC PANEL WITH GFR  URINALYSIS, W/ REFLEX TO CULTURE (INFECTION SUSPECTED)  TYPE AND SCREEN    EKG EKG Interpretation Date/Time:  Saturday December 13 2023 13:13:51 EDT Ventricular Rate:  76 PR Interval:  140 QRS Duration:  84 QT Interval:  366 QTC Calculation: 411 R Axis:   40  Text Interpretation: Normal sinus rhythm Nonspecific T wave abnormality No significant change since last tracing When compared with ECG of 14-Jan-2006 09:04, PREVIOUS ECG IS PRESENT Confirmed by Almond Army ([id number]) on 12/13/2023 1:45:17 PM  Radiology DG Chest 1 View Result Date: 12/13/2023 CLINICAL DATA:  Preoperative respiratory evaluation. EXAM: CHEST  1 VIEW COMPARISON:  03/04/2019 FINDINGS: Stable asymmetric elevation left hemidiaphragm. Cardiopericardial silhouette is at upper limits of normal for size. The lungs are clear without focal pneumonia, edema, pneumothorax or pleural effusion. No acute bony abnormality. IMPRESSION: Stable.  No acute findings. Electronically Signed   By: Donnal Fusi M.D.   On: 12/13/2023 14:04   DG Hip Unilat With Pelvis 2-3 Views Right Result Date: 12/13/2023 CLINICAL DATA:  Pain after a fall. EXAM: DG HIP (WITH OR WITHOUT PELVIS) 2-3V RIGHT COMPARISON:  06/29/2020 FINDINGS: Bones are diffusely demineralized. SI joints and symphysis pubis unremarkable. Deformity of the left inferior pubic ramus suggests old trauma. AP and  cross-table lateral views of the right hip show a right femoral neck fracture with varus angulation. IMPRESSION: Right femoral neck fracture with varus angulation. Electronically Signed   By: Donnal Fusi M.D.   On: 12/13/2023 14:04    Procedures Procedures    Medications Ordered in ED Medications  0.9 %  sodium chloride infusion ( Intravenous New Bag/Given 12/13/23 1352)  carbidopa-levodopa (SINEMET CR) 50-200 MG per tablet controlled release 1 tablet (has no administration in time range)  HYDROmorphone (DILAUDID) injection 1 mg (1 mg Intravenous Given 12/13/23 1347)  HYDROmorphone (DILAUDID) injection 1 mg (has  no administration in time range)  HYDROmorphone (DILAUDID) tablet 8 mg (has no administration in time range)  ceFAZolin (ANCEF) IVPB 2g/100 mL premix (has no administration in time range)  tranexamic acid (CYKLOKAPRON) IVPB 1,000 mg (has no administration in time range)  ondansetron (ZOFRAN) injection 4 mg (4 mg Intravenous Given 12/13/23 1347)  doxycycline (VIBRA-TABS) tablet 100 mg (100 mg Oral Given 12/13/23 1346)    ED Course/ Medical Decision Making/ A&P                                 Medical Decision Making Amount and/or Complexity of Data Reviewed Independent Historian: spouse and EMS External Data Reviewed: notes. Labs: ordered. Decision-making details documented in ED Course. Radiology: ordered and independent interpretation performed. Decision-making details documented in ED Course. ECG/medicine tests: ordered and independent interpretation performed. Decision-making details documented in ED Course.  Risk Prescription drug management.   Pt with multiple medical problems and comorbidities and presenting today with a complaint that caries a high risk for morbidity and mortality.  Here today with a fall and persistent hip pain.  Patient has findings concerning for hip fracture today versus pelvic fracture.  He is neurovascularly intact at this time.  There was no  head injury and patient is not anticoagulated.  Patient was given pain control.  Imaging are pending.  Patient denies systemic symptoms that could explain his fall prior to the fall.  Most likely related to his Parkinson's disease.  2:34 PM Patient's wife has now arrived and reports he did hit his head and has contusion and swelling noted on the right.  I have independently visualized and interpreted pt's images today.  Pelvic imaging shows a right femural neck hip fracture.  Chest x-ray without acute findings.  Head CT is pending.  I independently interpreted patient's labs and EKG.  EKG without acute findings.  CBC with a leukocytosis of 16 but normal hemoglobin, coags within normal limits.  CMP without acute findings. Spoke with Dr. Sulema Endo with orthopedics and he plans on doing a total hip replacement tomorrow will need to be n.p.o. after midnight.  Will admit to the hospitalist service.  This was discussed with the patient and his wife and they are comfortable with this plan.          Final Clinical Impression(s) / ED Diagnoses Final diagnoses:  Fall, initial encounter  Closed fracture of neck of right femur, initial encounter (HCC)  Parkinson's disease with dyskinesia and fluctuating manifestations (HCC)    Rx / DC Orders ED Discharge Orders     None         Almond Army, MD 12/13/23 1445

## 2023-12-13 NOTE — Discharge Instructions (Addendum)
 Orthopedic Surgery Discharge Instructions  Patient name: Luis Edgell Kirchoff Jr. Fracture: right femoral neck fracture Procedure Performed: right hip cemented hemiarthroplasty Date of Surgery: 12/14/2023 Surgeon: Colette Davies, MD  Activity: You are allowed to put as much weight on your leg as you would like. You can walk as much as you would like. Follow the posterior hip precautions that were discussed with you in the hospital. This means you should not flex your hip more than 90 degrees, cross your legs over in bed, internally rotate your hip (so make sure your toes are pointing forward or slightly out). There is a picture below demonstrating the safe zones for moving your hip.   You can perform household activities and ambulate as long as you are following the posterior hip precautions. When in bed, use the abduction pillow that you got in the hospital to prevent crossing your legs over in your sleep. The posterior hip precautions will be discontinued at 3 months when there has been enough healing.   Incision Care: Your incision site has a dressing over it. That dressing should remain in place and dry at all times for a total of one week after surgery. After one week, you can remove the dressing. Underneath the dressing, you will find skin staples. You should leave these staples in place. They will be taken out in the office. Do not pick, rub, or scrub at them. Do not put cream or lotion over the surgical area. After one week and once the dressing is off, it is okay to let soap and water run over your incision. Again, do not pick, scrub, or rub at the staples when bathing. Do not submerge (e.g., take a bath, swim, go in a hot tub, etc.) until six weeks after surgery. There may be some bloody drainage from the incision into the dressing after surgery. This is normal. You do not need to replace the dressing. Continue to leave it in place for the one week as instructed above. Should the dressing become  saturated with blood or drainage, please call the office for further instructions.   Medications: You have been prescribed oxycodone. This is a narcotic pain medication and should only be taken as prescribed. You should not drink alcohol or operate heavy machinery (including driving) while taking this medication. The oxycodone can cause constipation as a side effect. For that reason, you have been prescribed senna and miralax. These are both laxatives. You do not need to take this medication if you develop diarrhea. Should you remain constipated even while taking the senna and miralax, please use start taking the miralax twice daily. Tylenol has been prescribed to be taken every 8 hours, which will give you additional pain relief.   You have been prescribed aspirin as a blood thinner. This medication is to be taken to prevent blood clots. Take 81 milligrams twice daily. You should refrain from using other blood thinners (warfarin, apixaban, plavix, xarelto, etc.) while using the aspirin. You will need to take this medication for a total of 6 weeks after your surgery.   You should not use over-the-counter NSAIDs (ibuprofen, Aleve, Celebrex, naproxen, meloxicam, etc.) for pain relief because aspirin is a similar medication. There can be side effects including but not limited to kidney injury and ulcers if you take these type of medications with the aspirin.  In order to set expectations for opioid prescriptions, you will only be prescribed opioids for a total of six weeks after surgery and, at two-weeks after surgery,  your opioid prescription will start to tapered (decreased dosage and number of pills). If you have ongoing need for opioid medication six weeks after surgery, you will be referred to pain management. If you are already established with a provider that is giving you opioid medications, you should schedule an appointment with them for six weeks after surgery if you feel you are going to need  another prescription. State law only allows for opioid prescriptions one week at a time. If you are running out of opioid medication near the end of the week, please call the office during business hours before running out so I can send you another prescription.   Diet: You are safe to resume your regular diet after surgery.   Reasons to Call the Office After Surgery: You should feel free to call the office with any concerns or questions you have in the post-operative period, but you should definitely notify the office if you develop: -shortness of breath, chest pain, or trouble breathing -excessive bleeding, drainage, redness, or swelling around the surgical site -fevers, chills, or pain that is getting worse with each passing day -persistent nausea or vomiting -new weakness in the right leg, new or worsening numbness or tingling in the right leg -other concerns about your surgery  Follow Up Appointments: You have a follow up visit with Dr. Sulema Endo scheduled on 12/26/2023 at 3:45pm. Please arrive on time. The office location and phone number are listed below.   Office Information:  -Colette Davies, MD -Phone number: [phone] -Address: [address]       [place], [place] [address]

## 2023-12-13 NOTE — ED Triage Notes (Signed)
 Pt BIBGEMS from home after having two falls in 24 hrs. Pt unable to weight bear on the right side. Pt has + rotation. CMS intact.   150/70 133 cbg 75 hr 97% RA 20 rr

## 2023-12-14 ENCOUNTER — Inpatient Hospital Stay (HOSPITAL_COMMUNITY): Admitting: Anesthesiology

## 2023-12-14 ENCOUNTER — Encounter (HOSPITAL_COMMUNITY): Payer: Self-pay | Admitting: Internal Medicine

## 2023-12-14 ENCOUNTER — Inpatient Hospital Stay (HOSPITAL_COMMUNITY)

## 2023-12-14 ENCOUNTER — Encounter (HOSPITAL_COMMUNITY)
Admission: EM | Disposition: A | Discharge status: Skilled Nursing Facility | Payer: Self-pay | Source: Home / Self Care | Attending: Internal Medicine

## 2023-12-14 ENCOUNTER — Other Ambulatory Visit: Payer: Self-pay

## 2023-12-14 DIAGNOSIS — S72001A Fracture of unspecified part of neck of right femur, initial encounter for closed fracture: Secondary | ICD-10-CM | POA: Diagnosis not present

## 2023-12-14 DIAGNOSIS — E785 Hyperlipidemia, unspecified: Secondary | ICD-10-CM | POA: Diagnosis not present

## 2023-12-14 DIAGNOSIS — W19XXXA Unspecified fall, initial encounter: Secondary | ICD-10-CM | POA: Diagnosis not present

## 2023-12-14 DIAGNOSIS — G4733 Obstructive sleep apnea (adult) (pediatric): Secondary | ICD-10-CM

## 2023-12-14 DIAGNOSIS — G20B2 Parkinson's disease with dyskinesia, with fluctuations: Secondary | ICD-10-CM

## 2023-12-14 HISTORY — PX: TOTAL HIP ARTHROPLASTY: SHX124

## 2023-12-14 LAB — BASIC METABOLIC PANEL WITH GFR
Anion gap: 9 (ref 5–15)
BUN: 6 mg/dL — ABNORMAL LOW (ref 8–23)
CO2: 21 mmol/L — ABNORMAL LOW (ref 22–32)
Calcium: 8.1 mg/dL — ABNORMAL LOW (ref 8.9–10.3)
Chloride: 109 mmol/L (ref 98–111)
Creatinine, Ser: 0.46 mg/dL — ABNORMAL LOW (ref 0.61–1.24)
GFR, Estimated: >60 mL/min (ref >60)
Glucose, Bld: 142 mg/dL — ABNORMAL HIGH (ref 70–99)
Potassium: 3.8 mmol/L (ref 3.5–5.1)
Sodium: 139 mmol/L (ref 135–145)

## 2023-12-14 LAB — CBC
HCT: 36.1 % — ABNORMAL LOW (ref 39.0–52.0)
Hemoglobin: 11.9 g/dL — ABNORMAL LOW (ref 13.0–17.0)
MCH: 31.9 pg (ref 26.0–34.0)
MCHC: 33 g/dL (ref 30.0–36.0)
MCV: 96.8 fL (ref 80.0–100.0)
Platelets: 121 10*3/uL — ABNORMAL LOW (ref 150–400)
RBC: 3.73 MIL/uL — ABNORMAL LOW (ref 4.22–5.81)
RDW: 13.1 % (ref 11.5–15.5)
WBC: 10.3 10*3/uL (ref 4.0–10.5)
nRBC: 0 % (ref 0.0–0.2)

## 2023-12-14 SURGERY — ARTHROPLASTY, HIP, TOTAL,POSTERIOR APPROACH
Anesthesia: General | Site: Hip | Laterality: Right

## 2023-12-14 MED ORDER — GLYCOPYRROLATE 0.2 MG/ML IJ SOLN
INTRAMUSCULAR | Status: DC | PRN
  Administered 2023-12-14: .2 mg via INTRAVENOUS

## 2023-12-14 MED ORDER — BUPIVACAINE HCL (PF) 0.25 % IJ SOLN
INTRAMUSCULAR | Status: AC
  Filled 2023-12-14: qty 30

## 2023-12-14 MED ORDER — CHLORHEXIDINE GLUCONATE 0.12 % MT SOLN: 15.0000 mL | Freq: Once | OROMUCOSAL | Status: AC

## 2023-12-14 MED ORDER — SODIUM CHLORIDE 0.9 % IR SOLN
Status: DC | PRN
  Administered 2023-12-14: 3000 mL

## 2023-12-14 MED ORDER — CHLORHEXIDINE GLUCONATE 0.12 % MT SOLN
OROMUCOSAL | Status: AC
  Administered 2023-12-14: 15 mL via OROMUCOSAL
  Filled 2023-12-14: qty 15

## 2023-12-14 MED ORDER — ACETAMINOPHEN 10 MG/ML IV SOLN
INTRAVENOUS | Status: AC
  Filled 2023-12-14: qty 100

## 2023-12-14 MED ORDER — HYDROMORPHONE HCL 8 MG PO TABS
10.0000 mg | ORAL_TABLET | Freq: Once | ORAL | Status: AC
  Administered 2023-12-14: 10 mg via ORAL

## 2023-12-14 MED ORDER — TIMOLOL HEMIHYDRATE 0.5 % OP SOLN
1.0000 [drp] | Freq: Two times a day (BID) | OPHTHALMIC | Status: DC
  Administered 2023-12-16 – 2023-12-22 (×6): 1 [drp] via OPHTHALMIC
  Filled 2023-12-14 (×2): qty 0.1

## 2023-12-14 MED ORDER — GLYCOPYRROLATE PF 0.2 MG/ML IJ SOSY
PREFILLED_SYRINGE | INTRAMUSCULAR | Status: AC
  Filled 2023-12-14: qty 1

## 2023-12-14 MED ORDER — ONDANSETRON HCL 4 MG/2ML IJ SOLN
INTRAMUSCULAR | Status: AC
  Filled 2023-12-14: qty 2

## 2023-12-14 MED ORDER — PHENYLEPHRINE 80 MCG/ML (10ML) SYRINGE FOR IV PUSH (FOR BLOOD PRESSURE SUPPORT)
PREFILLED_SYRINGE | INTRAVENOUS | Status: DC | PRN
  Administered 2023-12-14: 160 ug via INTRAVENOUS
  Administered 2023-12-14: 80 ug via INTRAVENOUS
  Administered 2023-12-14: 160 ug via INTRAVENOUS

## 2023-12-14 MED ORDER — ROCURONIUM BROMIDE 10 MG/ML (PF) SYRINGE
PREFILLED_SYRINGE | INTRAVENOUS | Status: DC | PRN
  Administered 2023-12-14: 50 mg via INTRAVENOUS

## 2023-12-14 MED ORDER — EPHEDRINE SULFATE-NACL 50-0.9 MG/10ML-% IV SOSY
PREFILLED_SYRINGE | INTRAVENOUS | Status: DC | PRN
  Administered 2023-12-14 (×2): 10 mg via INTRAVENOUS

## 2023-12-14 MED ORDER — PROPOFOL 10 MG/ML IV BOLUS
INTRAVENOUS | Status: DC | PRN
  Administered 2023-12-14 (×2): 100 mg via INTRAVENOUS

## 2023-12-14 MED ORDER — MIDAZOLAM HCL 2 MG/2ML IJ SOLN
INTRAMUSCULAR | Status: AC
  Filled 2023-12-14: qty 2

## 2023-12-14 MED ORDER — LIDOCAINE 2% (20 MG/ML) 5 ML SYRINGE
INTRAMUSCULAR | Status: DC | PRN
  Administered 2023-12-14: 50 mg via INTRAVENOUS

## 2023-12-14 MED ORDER — ONDANSETRON HCL 4 MG/2ML IJ SOLN
INTRAMUSCULAR | Status: DC | PRN
  Administered 2023-12-14: 4 mg via INTRAVENOUS

## 2023-12-14 MED ORDER — ZOLPIDEM TARTRATE 5 MG PO TABS
10.0000 mg | ORAL_TABLET | Freq: Every day | ORAL | Status: DC
  Administered 2023-12-14 – 2023-12-21 (×7): 10 mg via ORAL
  Filled 2023-12-14 (×8): qty 2

## 2023-12-14 MED ORDER — FENTANYL CITRATE (PF) 250 MCG/5ML IJ SOLN
INTRAMUSCULAR | Status: AC
  Filled 2023-12-14: qty 5

## 2023-12-14 MED ORDER — BUPIVACAINE HCL 0.25 % IJ SOLN
INTRAMUSCULAR | Status: DC | PRN
  Administered 2023-12-14: 30 mL

## 2023-12-14 MED ORDER — PHENYLEPHRINE HCL-NACL 20-0.9 MG/250ML-% IV SOLN
INTRAVENOUS | Status: DC | PRN
  Administered 2023-12-14: 55 ug/min via INTRAVENOUS

## 2023-12-14 MED ORDER — CEFAZOLIN SODIUM-DEXTROSE 2-4 GM/100ML-% IV SOLN
INTRAVENOUS | Status: AC
  Filled 2023-12-14: qty 100

## 2023-12-14 MED ORDER — DEXAMETHASONE SODIUM PHOSPHATE 10 MG/ML IJ SOLN
INTRAMUSCULAR | Status: AC
  Filled 2023-12-14: qty 1

## 2023-12-14 MED ORDER — KETAMINE HCL 50 MG/5ML IJ SOSY
PREFILLED_SYRINGE | INTRAMUSCULAR | Status: DC | PRN
  Administered 2023-12-14 (×2): 25 mg via INTRAVENOUS

## 2023-12-14 MED ORDER — LIDOCAINE 2% (20 MG/ML) 5 ML SYRINGE
INTRAMUSCULAR | Status: AC
  Filled 2023-12-14: qty 5

## 2023-12-14 MED ORDER — NYSTATIN 100000 UNIT/ML MT SUSP
5.0000 mL | Freq: Three times a day (TID) | OROMUCOSAL | Status: DC
  Administered 2023-12-14 – 2023-12-22 (×24): 500000 [IU] via ORAL
  Filled 2023-12-14 (×24): qty 5

## 2023-12-14 MED ORDER — PROPOFOL 10 MG/ML IV BOLUS
INTRAVENOUS | Status: AC
  Filled 2023-12-14: qty 20

## 2023-12-14 MED ORDER — KETAMINE HCL 50 MG/5ML IJ SOSY
PREFILLED_SYRINGE | INTRAMUSCULAR | Status: AC
  Filled 2023-12-14: qty 5

## 2023-12-14 MED ORDER — FENTANYL CITRATE (PF) 250 MCG/5ML IJ SOLN
INTRAMUSCULAR | Status: DC | PRN
  Administered 2023-12-14 (×2): 50 ug via INTRAVENOUS
  Administered 2023-12-14: 100 ug via INTRAVENOUS
  Administered 2023-12-14: 50 ug via INTRAVENOUS

## 2023-12-14 MED ORDER — DOXYCYCLINE HYCLATE 100 MG PO TABS
100.0000 mg | ORAL_TABLET | Freq: Two times a day (BID) | ORAL | Status: DC
  Administered 2023-12-14 – 2023-12-22 (×16): 100 mg via ORAL
  Filled 2023-12-14 (×16): qty 1

## 2023-12-14 MED ORDER — VANCOMYCIN HCL 1000 MG IV SOLR
INTRAVENOUS | Status: AC
  Filled 2023-12-14: qty 20

## 2023-12-14 MED ORDER — LACTATED RINGERS IV SOLN: INTRAVENOUS | Status: DC

## 2023-12-14 MED ORDER — FENTANYL CITRATE (PF) 100 MCG/2ML IJ SOLN: 25.0000 ug | INTRAMUSCULAR | Status: DC | PRN

## 2023-12-14 MED ORDER — PHENYLEPHRINE 80 MCG/ML (10ML) SYRINGE FOR IV PUSH (FOR BLOOD PRESSURE SUPPORT)
PREFILLED_SYRINGE | INTRAVENOUS | Status: AC
  Filled 2023-12-14: qty 10

## 2023-12-14 MED ORDER — TRANEXAMIC ACID-NACL 1000-0.7 MG/100ML-% IV SOLN
INTRAVENOUS | Status: AC
  Filled 2023-12-14: qty 100

## 2023-12-14 MED ORDER — GABAPENTIN 300 MG PO CAPS
ORAL_CAPSULE | ORAL | Status: AC
Start: 2023-12-14 — End: 2023-12-14
  Administered 2023-12-14: 600 mg via ORAL
  Filled 2023-12-14: qty 2

## 2023-12-14 MED ORDER — WATER FOR IRRIGATION, STERILE IR SOLN
Status: DC | PRN
  Administered 2023-12-14: 1000 mL

## 2023-12-14 MED ORDER — ALBUMIN HUMAN 5 % IV SOLN
12.5000 g | Freq: Once | INTRAVENOUS | Status: AC
  Administered 2023-12-14: 12.5 g via INTRAVENOUS

## 2023-12-14 MED ORDER — DEXAMETHASONE SODIUM PHOSPHATE 10 MG/ML IJ SOLN
INTRAMUSCULAR | Status: DC | PRN
  Administered 2023-12-14: 10 mg via INTRAVENOUS

## 2023-12-14 MED ORDER — CEFAZOLIN SODIUM-DEXTROSE 2-4 GM/100ML-% IV SOLN
2.0000 g | Freq: Four times a day (QID) | INTRAVENOUS | Status: AC
  Administered 2023-12-14 – 2023-12-15 (×3): 2 g via INTRAVENOUS
  Filled 2023-12-14 (×3): qty 100

## 2023-12-14 MED ORDER — ALBUMIN HUMAN 5 % IV SOLN
INTRAVENOUS | Status: AC
  Filled 2023-12-14: qty 250

## 2023-12-14 MED ORDER — ROCURONIUM BROMIDE 10 MG/ML (PF) SYRINGE
PREFILLED_SYRINGE | INTRAVENOUS | Status: AC
  Filled 2023-12-14: qty 10

## 2023-12-14 MED ORDER — ORAL CARE MOUTH RINSE: 15.0000 mL | Freq: Once | OROMUCOSAL | Status: AC

## 2023-12-14 MED ORDER — DEXMEDETOMIDINE HCL IN NACL 200 MCG/50ML IV SOLN
INTRAVENOUS | Status: DC | PRN
  Administered 2023-12-14 (×3): 8 ug via INTRAVENOUS

## 2023-12-14 MED ORDER — PREDNISOLONE ACETATE 1 % OP SUSP
1.0000 [drp] | Freq: Every day | OPHTHALMIC | Status: DC
  Administered 2023-12-15 – 2023-12-22 (×8): 1 [drp] via OPHTHALMIC
  Filled 2023-12-14: qty 5

## 2023-12-14 MED ORDER — PREDNISOLONE 15 MG/5ML PO SOLN
22.5000 mg | Freq: Every day | ORAL | Status: DC
  Administered 2023-12-14 – 2023-12-22 (×7): 22.5 mg via ORAL
  Filled 2023-12-14 (×8): qty 10
  Filled 2023-12-14: qty 7.5

## 2023-12-14 MED ORDER — HYDROMORPHONE HCL 1 MG/ML IJ SOLN: 0.5000 mg | INTRAMUSCULAR | Status: DC | PRN

## 2023-12-14 MED ORDER — POLYETHYLENE GLYCOL 3350 17 G PO PACK
17.0000 g | PACK | Freq: Every day | ORAL | Status: DC
  Administered 2023-12-14 – 2023-12-20 (×7): 17 g via ORAL
  Filled 2023-12-14 (×8): qty 1

## 2023-12-14 MED ORDER — MIDAZOLAM HCL 2 MG/2ML IJ SOLN
INTRAMUSCULAR | Status: DC | PRN
  Administered 2023-12-14: 2 mg via INTRAVENOUS

## 2023-12-14 MED ORDER — HYDROMORPHONE HCL 2 MG PO TABS
ORAL_TABLET | ORAL | Status: AC
  Filled 2023-12-14: qty 5

## 2023-12-14 MED ORDER — DIAZEPAM 5 MG PO TABS: 10.0000 mg | ORAL_TABLET | Freq: Two times a day (BID) | ORAL | Status: DC | PRN

## 2023-12-14 MED ORDER — HYDROMORPHONE HCL 1 MG/ML IJ SOLN
0.2500 mg | INTRAMUSCULAR | Status: DC | PRN
Start: 2023-12-14 — End: 2023-12-14

## 2023-12-14 MED ORDER — TRANEXAMIC ACID-NACL 1000-0.7 MG/100ML-% IV SOLN
1000.0000 mg | Freq: Once | INTRAVENOUS | Status: DC
  Filled 2023-12-14: qty 100

## 2023-12-14 MED ORDER — 0.9 % SODIUM CHLORIDE (POUR BTL) OPTIME
TOPICAL | Status: DC | PRN
  Administered 2023-12-14: 1000 mL

## 2023-12-14 MED ORDER — SUGAMMADEX SODIUM 200 MG/2ML IV SOLN
INTRAVENOUS | Status: DC | PRN
  Administered 2023-12-14: 200 mg via INTRAVENOUS

## 2023-12-14 MED ORDER — VANCOMYCIN HCL 1 G IV SOLR
INTRAVENOUS | Status: DC | PRN
  Administered 2023-12-14: 1000 mg via TOPICAL

## 2023-12-14 SURGICAL SUPPLY — 51 items
BLADE SAGITTAL 25.0X1.27X90 (BLADE) ×2 IMPLANT
BRUSH FEMORAL CANAL (MISCELLANEOUS) IMPLANT
CEMENT BONE SIMPLEX SPEEDSET (Cement) IMPLANT
CEMENT RESTRICTOR BONE PREP ST (Cement) IMPLANT
CHLORAPREP W/TINT 26 (MISCELLANEOUS) ×4 IMPLANT
COVER SURGICAL LIGHT HANDLE (MISCELLANEOUS) ×2 IMPLANT
DRAPE HALF SHEET 40X57 (DRAPES) ×2 IMPLANT
DRAPE HIP W/POCKET STRL (MISCELLANEOUS) ×2 IMPLANT
DRAPE INCISE IOBAN 85X60 (DRAPES) ×2 IMPLANT
DRAPE POUCH INSTRU U-SHP 10X18 (DRAPES) ×2 IMPLANT
DRAPE U-SHAPE 47X51 STRL (DRAPES) ×4 IMPLANT
DRSG AQUACEL AG ADV 3.5X10 (GAUZE/BANDAGES/DRESSINGS) ×2 IMPLANT
ELECT BLADE 4.0 EZ CLEAN MEGAD (MISCELLANEOUS) ×1 IMPLANT
ELECTRODE BLDE 4.0 EZ CLN MEGD (MISCELLANEOUS) ×2 IMPLANT
GLOVE BIOGEL PI IND STRL 8 (GLOVE) ×2 IMPLANT
GLOVE SRG 8 PF TXTR STRL LF DI (GLOVE) ×2 IMPLANT
GLOVE SURG ORTHO 8.0 STRL STRW (GLOVE) ×4 IMPLANT
GOWN STRL REUS W/ TWL LRG LVL3 (GOWN DISPOSABLE) ×4 IMPLANT
GOWN STRL REUS W/ TWL XL LVL3 (GOWN DISPOSABLE) ×2 IMPLANT
HEAD MOD COCR 28MM HD +3MM NK (Orthopedic Implant) IMPLANT
HOOD PEEL AWAY T7 (MISCELLANEOUS) ×6 IMPLANT
KIT BASIN OR (CUSTOM PROCEDURE TRAY) ×2 IMPLANT
KIT TURNOVER KIT B (KITS) ×2 IMPLANT
MANIFOLD NEPTUNE II (INSTRUMENTS) ×2 IMPLANT
MARKER SKIN DUAL TIP RULER LAB (MISCELLANEOUS) ×2 IMPLANT
NDL 18GX1X1/2 (RX/OR ONLY) (NEEDLE) ×2 IMPLANT
NEEDLE 18GX1X1/2 (RX/OR ONLY) (NEEDLE) ×1 IMPLANT
NS IRRIG 1000ML POUR BTL (IV SOLUTION) ×2 IMPLANT
PACK TOTAL JOINT (CUSTOM PROCEDURE TRAY) ×2 IMPLANT
PRESSURIZER FEMORAL UNIV (MISCELLANEOUS) IMPLANT
RETRIEVER SUT HEWSON (MISCELLANEOUS) ×2 IMPLANT
RINGBLOC BI POLAR 28X58MM (Orthopedic Implant) IMPLANT
SEALER BIPOLAR AQUA 6.0 (INSTRUMENTS) ×2 IMPLANT
SET HNDPC FAN SPRY TIP SCT (DISPOSABLE) ×2 IMPLANT
SOLUTION IRRIG SURGIPHOR (IV SOLUTION) ×2 IMPLANT
STEM CENTRALIZER DISTAL SZ10 (Stem) IMPLANT
STEM HIP FEMORAL 9MMX130MM (Stem) IMPLANT
SUCTION TUBE FRAZIER 10FR DISP (SUCTIONS) ×2 IMPLANT
SUT BONE WAX W31G (SUTURE) ×2 IMPLANT
SUT ETHIBOND 2 V 37 (SUTURE) ×2 IMPLANT
SUT MNCRL AB 3-0 PS2 18 (SUTURE) ×2 IMPLANT
SUT STRATAFIX 1PDS 45CM VIOLET (SUTURE) ×4 IMPLANT
SUT VIC AB 0 CT1 27XBRD ANBCTR (SUTURE) ×2 IMPLANT
SUT VIC AB 2-0 CT2 27 (SUTURE) ×4 IMPLANT
SYR 20ML LL LF (SYRINGE) ×2 IMPLANT
SYR 50ML LL SCALE MARK (SYRINGE) ×2 IMPLANT
TOWEL GREEN STERILE (TOWEL DISPOSABLE) ×2 IMPLANT
TOWER CARTRIDGE SMART MIX (DISPOSABLE) IMPLANT
TRAY FOLEY MTR SLVR 16FR STAT (SET/KITS/TRAYS/PACK) ×2 IMPLANT
TUBE SUCT ARGYLE STRL (TUBING) ×2 IMPLANT
WATER STERILE IRR 1000ML POUR (IV SOLUTION) ×2 IMPLANT

## 2023-12-14 NOTE — Plan of Care (Signed)

## 2023-12-14 NOTE — Progress Notes (Signed)
 Transition of Care (TOC) - CAGE-AID Screening   Patient Details  Name: Luis [name]. MRN: [id number] Date of Birth: November 13, 1955  Transition of Care [facility]) CM/SW Contact:    Brunetta Capes, RN Phone Number: 12/14/2023, 10:27 PM   Clinical Narrative:  Patient denies the use of alcohol and illicit drugs, resources not given at this time.  CAGE-AID Screening:    Have You Ever Felt You Ought to Cut Down on Your Drinking or Drug Use?: No Have People Annoyed You By Critizing Your Drinking Or Drug Use?: No Have You Felt Bad Or Guilty About Your Drinking Or Drug Use?: No Have You Ever Had a Drink or Used Drugs First Thing In The Morning to Steady Your Nerves or to Get Rid of a Hangover?: No CAGE-AID Score: 0  Substance Abuse Education Offered: No

## 2023-12-14 NOTE — Op Note (Signed)
 Orthopedic Surgery Operative Report   Procedure: Right hip cemented hemiarthroplasty for displaced femoral neck fracture   Modifier: none   Date of procedure: 12/14/2022   Patient name: Luis Walla Zaragoza Jr. MRN: [id number] DOB: April 22, 1956   Surgeon: Colette Davies, MD Assistant: None Pre-operative diagnosis: displaced right femoral neck fracture Post-operative diagnosis: same as above Findings: displaced femoral neck fracture   Specimens: none Anesthesia: general EBL: 50cc Complications: none Pre-incision antibiotic: ancef TXA given prior to incision as well   Implants:  Implant Name Type Inv. Item Serial No. Manufacturer Lot No. LRB No. Used Action  CEMENT RESTRICTOR BONE PREP ST - [id number] Cement CEMENT RESTRICTOR BONE PREP ST  STRYK[employer]UMENTS [id number] Right 1 Implanted  CEMENT BONE SIMPLEX SPEEDSET - [id number] Cement CEMENT BONE SIMPLEX SPEEDSET  STRY[employer]TH[employer] [id number] Right 2 Implanted  STEM CENTRALIZER DISTAL SZ10 - [id number] Stem STEM CENTRALIZER DISTAL SZ10  ZIMMER RECON(ORTH,TRAU,BIO,SG) [id number] Right 1 Implanted  STEM HIP FEMORAL 9MMX130MM - [id number] Stem STEM HIP FEMORAL 9MMX130MM  ZIMMER RECON(ORTH,TRAU,BIO,SG) [id number] Right 1 Implanted  HEAD MOD COCR HD +3MM NK - [id number] Orthopedic Implant HEAD MOD COCR HD +3MM NK  ZIMMER RECON(ORTH,TRAU,BIO,SG) [id number] Right 1 Implanted  Acetabular Cup, 58mm outer    ZIMMER RECON(ORTH,TRAU,BIO,SG) [id number] Right 1 Implanted        Indication for procedure: Patient is a 68 year old male who had a ground level fall. The patient was complaining of right hip pain and was found to have a displaced femoral neck fracture. I recommended cemented hemiarthroplasty to treat this fracture. Explained the risks of this procedure included, but were not limited to: dislocation, leg length discrepancy, femur fracture, neurovascular injury, hardware failure, infection, bleeding, stiffness, need for additional procedures, deep vein  thrombosis, MI, pulmonary embolism, and death. The benefits of this procedure would be to allow for early ambulation and mobilization. The alternatives of this surgery would be to treat the fracture with immobilization in bed. I answered all of the patient's questions. Patient elected to proceed with hemiarthroplasty.    Procedure Description: The patient was met in the pre-operative holding area. The patient's identity and consent were verified. The operative site was marked. The patient's remaining questions about surgery were answered. The patient was brought back to the operating room. General anesthesia was induced and an endotracheal tube was placed by the anesthesia staff. The patient was transferred to the operating table in the lateral position on a peg board with the operative hip up. An axillary roll was placed in the axilla of the down arm. Pegs were placed to secure the patient in the lateral position. All bony prominences were well padded. The surgical area was cleansed with chlorhexidine scrub brush and then alcohol. Ancef was given prior to incision. 1g of TXA was also given before incision. The patient's skin was then prepped and draped in a standard, sterile fashion. A time out was performed that identified the patient, the procedure, and the laterality. All team members agreed with what was stated in the time out.    An incision was made from the base of the greater trochanter to the buttock. This was incision was gently curved posteriorly and centered over the posterior 1/3 of the greater trochanter. Incision was taken sharply down through skin and dermis. Electrocautery was used to dissect through the subcutaneous fat. The fascia was divided in line with the skin incision.The leg was internally rotated and a deaver was placed under the gluteus medius. The short external rotator tendons  were observed inserting around the greater trochanter. Electrocautery was used to dissect the short  external rotators and capsule off of the proximal femur. An L shaped capsulotomy was made. 2 #2 ehtibond sutures were placed into the capsule and short external rotators. At this point, the fracture was visible within the wound. The lesser trochanter was palpable at the base of the wound. The fracture was about 1cm away from the lesser trochanter so no new neck cut was made. A combination of a cobb going around the femoral head and a corkscrew were used to remove the femoral head from the acetabulum. The femoral head measured 58 on the back table. A 58 head trial was inserted into the acetabulum and was felt to have a good fit. The leg was then adducted and internally rotated. A femoral elevator was placed under the proximal femur to expose the proximal femur. A box cutter was used to open the canal. A canal finder was used to open the canal further. A 9 broach was advanced down the canal. As it was withdrawn, it was used as a rasp along the lateral aspect of the canal to further lateralize. This process was repeated with the broaches until a size 11 broach was reached. At this broach size, there was felt to be a good canal fit. Since the plan was to cement, a size 9 stem was chosen. Plan was made for final implantation of a size 9 stem. The canal was then irrigated with pulse lavage and suctioned dry while cement was being prepared on the back table. A canal restrictor was placed down the canal to be just distal to the length of the final stem. Cement was then pressurized into the canal. A distal centralizer was added to the final stem. The stem was inserted into the canal and impacted into place. Excess cement was removed from around the stem. The stem handle was used to hold the stem slightly anteverted while the cement dried. I waited about 15 minutes for the cement to completely dry. At that point a trial head and neck were placed onto the final stem. A +83mm neck and 58 head provided stability through range of  motion. The hip was able to flex to 90 degrees and internally rotate to 70 degrees before dislocating. The leg lengths were palpated with that trial in and were felt to be symmetric. A 58 head and +72mm neck were selected. These final implants were placed over the stem and impacted into place. The hip was then reduced and taken through range of motion. Dislocation was noted at the same position as the trials with the final implants in. Leg lengths again were felt to be similar.    The wound was copiously irrigated with sterile saline via pulse lavage. Two drill holes were made in the greater trochanter. The ethibond that was placed into the capsule and short external rotators was passed through the drill holes. These sutures were tied over a bone bridge on the greater trochanter with the leg in external rotation. The capsule and short external rotators were able to reapproximated. Vancomycin powder was placed into the wound. The fascial layer was closed with 0 vicryl. The deep dermal layer was closed with 2-0 vicryl. The skin was closed with staples. An aquacel dressing was applied. All counts were correct at the end of the case. The patient was transferred back to a hospital bed and a foam abduction pillow was placed between his legs. The patient was extubated and  brought back to the post anesthesia care unit in stable condition.    Post-operative plan: The patient will recover in the post-anesthesia care unit and then go to the floor. The patient will receive two post-operative doses of ancef. He will get another dose of TXA. The patient will be weight bearing as tolerated with posterior hip precautions. The patient should have an abduction pillow between his legs when in bed. The patient will work with physical therapy. The patient's disposition will be determined by the medicine service.     Colette Davies, MD Orthopedic Surgeon

## 2023-12-14 NOTE — Anesthesia Procedure Notes (Signed)
 Procedure Name: Intubation Date/Time: 12/14/2023 9:57 AM  Performed by: Samul Croft, CRNAPre-anesthesia Checklist: Patient identified, Emergency Drugs available, Suction available and Patient being monitored Patient Re-evaluated:Patient Re-evaluated prior to induction Oxygen Delivery Method: Circle System Utilized Preoxygenation: Pre-oxygenation with 100% oxygen Induction Type: IV induction Ventilation: Mask ventilation without difficulty Laryngoscope Size: Glidescope and 3 Grade View: Grade II Tube type: Oral Tube size: 7.5 mm Number of attempts: 1 Airway Equipment and Method: Stylet and Oral airway Placement Confirmation: ETT inserted through vocal cords under direct vision, positive ETCO2 and breath sounds checked- equal and bilateral Secured at: 21 cm Tube secured with: Tape Dental Injury: Teeth and Oropharynx as per pre-operative assessment  Comments: Good mask without adjunct

## 2023-12-14 NOTE — Transfer of Care (Signed)
 Immediate Anesthesia Transfer of Care Note  Patient: Luis Maclay Rostron Jr.  Procedure(s) Performed: ARTHROPLASTY, HIP, HEMI POSTERIOR APPROACH (Right: Hip)  Patient Location: PACU  Anesthesia Type:General  Level of Consciousness: awake and alert   Airway & Oxygen Therapy: Patient Spontanous Breathing and Patient connected to face mask oxygen  Post-op Assessment: Report given to RN and Post -op Vital signs reviewed and stable  Post vital signs: Reviewed and stable  Last Vitals:  Vitals Value Taken Time  BP 93/67 12/14/23 1340  Temp 98.3   Pulse 72 12/14/23 1344  Resp 14 12/14/23 1344  SpO2 95 % 12/14/23 1344  Vitals shown include unfiled device data.  Last Pain:  Vitals:   12/14/23 0916  TempSrc:   PainSc: 10-Worst pain ever         Complications: There were no known notable events for this encounter.

## 2023-12-14 NOTE — Brief Op Note (Signed)
 12/14/2023  1:44 PM  PATIENT:  Luis Hashimoto Mannings Jr.  68 y.o. male  PRE-OPERATIVE DIAGNOSIS:  RIGHT FEMORAL NECK FRACTURE  POST-OPERATIVE DIAGNOSIS:  RIGHT FEMORAL NECK FRACTURE  PROCEDURE:  Procedure(s): ARTHROPLASTY, HIP, HEMI POSTERIOR APPROACH (Right)  SURGEON:  Surgeons and Role:    * Diedra Fowler, MD - Primary  PHYSICIAN ASSISTANT: none  ASSISTANTS: none   ANESTHESIA:   general  EBL:  50 mL   BLOOD ADMINISTERED:none  DRAINS: none   LOCAL MEDICATIONS USED:  MARCAINE     SPECIMEN:  No Specimen  DISPOSITION OF SPECIMEN:  N/A  COUNTS:  YES  TOURNIQUET: NONE  DICTATION: .Note written in EPIC  PLAN OF CARE: Admit to inpatient   PATIENT DISPOSITION:  PACU - hemodynamically stable.   Delay start of Pharmacological VTE agent (>24hrs) due to surgical blood loss or risk of bleeding: no

## 2023-12-14 NOTE — Plan of Care (Signed)

## 2023-12-14 NOTE — Progress Notes (Signed)
 Orthopedic Surgery Progress Note   Assessment: Patient is a 68 y.o. male with right displaced femoral neck fracture   Plan: -Planning for hemiarthroplasty today -Diet: NPO for procedure -DVT ppx: aspirin 81mg  BID post-operatively -Ancef and TXA on call to OR -Weight bearing status: NWB RLE -PT evaluate and treat post-operatively -Pain control -Dispo: pending completion of operative plans  ___________________________________________________________________________  Subjective: No acute events overnight. Having significant pain at right hip but medications are helping. No pain elsewhere. Denies paresthesias and numbness. Remaining questions about surgery answered.    Physical Exam:  General: no acute distress, appears stated age Neurologic: alert, answering questions appropriately, following commands Respiratory: unlabored breathing on room air, symmetric chest rise Psychiatric: appropriate affect, normal cadence to speech  MSK:   -Right lower extremity  TTP over the hip, no other tenderness to palpation over the extremity GSC intact, TA and EHL not intact due to chronic foot drop from muscular dystrophy Plantarflexes toes Sensation intact to light touch in sural, saphenous, tibial, deep peroneal, and superficial peroneal nerve distributions Foot warm and well perfused, palpable DP pulse   Patient name: Luis Leventhal Melroy Jr. Patient MRN: [id number] Date: 12/14/23

## 2023-12-14 NOTE — Anesthesia Postprocedure Evaluation (Signed)
 Anesthesia Post Note  Patient: Luis E Waltner Jr.  Procedure(s) Performed: ARTHROPLASTY, HIP, HEMI POSTERIOR APPROACH (Right: Hip)     Patient location during evaluation: PACU Anesthesia Type: General Level of consciousness: awake and alert Pain management: pain level controlled Vital Signs Assessment: post-procedure vital signs reviewed and stable Respiratory status: spontaneous breathing, nonlabored ventilation and respiratory function stable Cardiovascular status: blood pressure returned to baseline and stable Postop Assessment: no apparent nausea or vomiting Anesthetic complications: no  There were no known notable events for this encounter.  Last Vitals:  Vitals:   12/14/23 1515 12/14/23 1539  BP: 100/70 104/76  Pulse: (!) 55 66  Resp: 11 15  Temp: 36.7 C   SpO2: 93% 94%    Last Pain:  Vitals:   12/14/23 1653  TempSrc:   PainSc: 6                  Jezabel Lecker,W. EDMOND

## 2023-12-14 NOTE — Progress Notes (Signed)
 [facility]                                                                              Luis [name], is a 68 y.o. male, DOB - 01/06/1956, WUJ:[id number] Admit date - 12/13/2023    Outpatient Primary MD for the patient is Benedetta Bradley, MD  LOS - 1  days  Chief Complaint  Patient presents with   Fall       Brief summary   Patient is a 68 year old male with GERD, HLD, FSH muscular dystrophy, Parkinson's disease, spinal stenosis, chronic pain, OSA presented after falls at home and hip pain.  Patient reported first fall the night before the admission while walking to the bathroom with his walker, had significant pain in the right hip.  On the day of admission he fell again when he was unable to bear weight.  Family reported he did hit his head during the fall. CT head negative CT right hip showed slightly comminuted displaced fracture of the right femoral neck Orthopedics consulted  Assessment & Plan    Principal Problem:   Closed displaced fracture of right femoral neck (HCC) -Orthopedics consulted, n.p.o., plan for OR today -Continue pain control, bowel regimen - DVT prophylaxis per orthopedics  Active Problems: Leukocytosis -Possible reactive, however patient has been on steroids due to history of mucous membrane pemphigoid -afebrile, no infection on chest x-ray, UA pending - Repeat CBC pending this morning  Parkinson's disease - Continue Sinemet - PT OT  OSA - Last recently diagnosed tolerating CPAP    FSH (facioscapulohumeral muscular dystrophy) (HCC), spinal stenosis Chronic severe neuropathic pain, chronic low back pain -Has been following neurology outpatient, Dr. Gracie Lav -Patient has been on oral Dilaudid, gabapentin -PT OT after surgery  History of mucous membrane pemphigoid, - Has been following outpatient dermatology - Has been on doxycycline, prednisolone, Pepcid as needed   Estimated body mass index is 24.61 kg/m as calculated  from the following:   Height as of this encounter: 5' 10.98" (1.803 m).   Weight as of this encounter: 80 kg.  Code Status: Full code DVT Prophylaxis:  SCDs Start: 12/13/23 1514   Level of Care: Level of care: Telemetry Surgical Family Communication: Updated patient's wife at the bedside Disposition Plan:      Remains inpatient appropriate: Patient and wife requested CIR after surgery   Procedures:    Consultants:   Orthopedics  Antimicrobials:   Anti-infectives (From admission, onward)    Start     Dose/Rate Route Frequency Ordered Stop   12/14/23 0832  ceFAZolin (ANCEF) 2-4 GM/100ML-% IVPB  Status:  Discontinued       Note to Pharmacy: Alvena Aurora M: cabinet override      12/14/23 0832 12/14/23 0841   12/13/23 1445  [MAR Hold]  ceFAZolin (ANCEF) IVPB 2g/100 mL premix        (MAR Hold since Sun 12/14/2023 at 0826.Hold Reason: Transfer to a Procedural area)   2 g 200 mL/hr over 30 Minutes Intravenous To Surgery 12/13/23 1434 12/14/23 1445   12/13/23 1315  doxycycline (VIBRA-TABS) tablet 100 mg        100  mg Oral  Once 12/13/23 1312 12/13/23 1346          Medications  [MAR Hold] carbidopa-levodopa  1 tablet Oral Once   [MAR Hold] carbidopa-levodopa  1 tablet Oral TID   [MAR Hold] gabapentin  600 mg Oral QID      Subjective:   Alto Stawicki was seen and examined today.  No acute complaints, awaiting surgery today.  Wife at the bedside.  Patient denies dizziness, chest pain, shortness of breath, abdominal pain, N/V.  No fevers.  Objective:   Vitals:   12/13/23 2352 12/14/23 0450 12/14/23 0808 12/14/23 0827  BP: 105/68 102/74 106/75   Pulse: 70 66 68   Resp: 19 20    Temp: 98.4 F (36.9 C) 98.7 F (37.1 C) 99.8 F (37.7 C)   TempSrc: Oral Oral Oral   SpO2: 96% 95% 96%   Weight:    80 kg  Height:    5' 10.98" (1.803 m)    Intake/Output Summary (Last 24 hours) at 12/14/2023 0900 Last data filed at 12/14/2023 0502 Gross per 24 hour  Intake 240 ml  Output  400 ml  Net -160 ml     Wt Readings from Last 3 Encounters:  12/14/23 80 kg  10/10/23 68 kg  04/11/23 68 kg     Exam General: Alert and oriented x 3, NAD Cardiovascular: S1 S2 auscultated,  RRR Respiratory: CTA B Gastrointestinal: Soft, nontender, nondistended, + bowel sounds Ext: no pedal edema bilaterally Neuro: no new deficits today Psych: Normal affect     Data Reviewed:  I have personally reviewed following labs    CBC Lab Results  Component Value Date   WBC 16.1 (H) 12/13/2023   RBC 5.14 12/13/2023   HGB 16.4 12/13/2023   HCT 49.4 12/13/2023   MCV 96.1 12/13/2023   MCH 31.9 12/13/2023   PLT 163 12/13/2023   MCHC 33.2 12/13/2023   RDW 12.7 12/13/2023   LYMPHSABS 0.4 (L) 12/13/2023   MONOABS 0.5 12/13/2023   EOSABS 0.0 12/13/2023   BASOSABS 0.0 12/13/2023     Last metabolic panel Lab Results  Component Value Date   NA 138 12/13/2023   K 3.7 12/13/2023   CL 98 12/13/2023   CO2 24 12/13/2023   BUN 9 12/13/2023   CREATININE 0.39 (L) 12/13/2023   GLUCOSE 119 (H) 12/13/2023   GFRNONAA >60 12/13/2023   CALCIUM 9.0 12/13/2023   PROT 5.8 (L) 06/05/2022   ALBUMIN 3.0 (L) 06/05/2022   BILITOT 0.4 06/05/2022   ALKPHOS 120 06/05/2022   AST 20 06/05/2022   ALT 9 06/05/2022   ANIONGAP 16 (H) 12/13/2023    CBG (last 3)  No results for input(s): "GLUCAP" in the last 72 hours.    Coagulation Profile: Recent Labs  Lab 12/13/23 1350  INR 1.1     Radiology Studies: I have personally reviewed the imaging studies  CT Head Wo Contrast Result Date: 12/13/2023 CLINICAL DATA:  Head trauma, minor (Age >= 65y). EXAM: CT HEAD WITHOUT CONTRAST TECHNIQUE: Contiguous axial images were obtained from the base of the skull through the vertex without intravenous contrast. RADIATION DOSE REDUCTION: This exam was performed according to the departmental dose-optimization program which includes automated exposure control, adjustment of the mA and/or kV according to patient  size and/or use of iterative reconstruction technique. COMPARISON:  Head CT 12/23/2017 FINDINGS: Brain: There is no evidence of an acute infarct, intracranial hemorrhage, mass, midline shift, or extra-axial fluid collection. Cerebral volume is within normal limits for  age with normal size of the ventricles. Vascular: No hyperdense vessel or unexpected calcification. Skull: No acute fracture or suspicious lesion. Sinuses/Orbits: Visualized paranasal sinuses and mastoid air cells are clear. Bilateral cataract extraction. Other: None. IMPRESSION: Negative head CT for age. Electronically Signed   By: Aundra Lee M.D.   On: 12/13/2023 15:54   CT HIP RIGHT WO CONTRAST Result Date: 12/13/2023 CLINICAL DATA:  Preoperative planning. EXAM: CT OF THE RIGHT HIP WITHOUT CONTRAST TECHNIQUE: Multidetector CT imaging of the right hip was performed according to the standard protocol. Multiplanar CT image reconstructions were also generated. RADIATION DOSE REDUCTION: This exam was performed according to the departmental dose-optimization program which includes automated exposure control, adjustment of the mA and/or kV according to patient size and/or use of iterative reconstruction technique. COMPARISON:  12/21/2023, 05/27/2022. FINDINGS: Bones/Joint/Cartilage There is a mildly-comminuted fracture femoral neck with overlapping and mild lateral displacement of the distal fracture fragment. No dislocation is seen. A stable region of sclerosis is noted in the proximal sacrum, unchanged from 2023. Mild degenerative changes are present at the right hip. Ligaments Suboptimally assessed by CT. Muscles and Tendons No intramuscular hematoma is seen. Marked muscular atrophy is noted in the proximal right lower extremity. Soft tissues No acute abnormality. IMPRESSION: Slightly comminuted displaced fracture of the right femoral neck. Electronically Signed   By: Wyvonnia Heimlich M.D.   On: 12/13/2023 15:32   DG Chest 1 View Result Date:  12/13/2023 CLINICAL DATA:  Preoperative respiratory evaluation. EXAM: CHEST  1 VIEW COMPARISON:  03/04/2019 FINDINGS: Stable asymmetric elevation left hemidiaphragm. Cardiopericardial silhouette is at upper limits of normal for size. The lungs are clear without focal pneumonia, edema, pneumothorax or pleural effusion. No acute bony abnormality. IMPRESSION: Stable.  No acute findings. Electronically Signed   By: Donnal Fusi M.D.   On: 12/13/2023 14:04   DG Hip Unilat With Pelvis 2-3 Views Right Result Date: 12/13/2023 CLINICAL DATA:  Pain after a fall. EXAM: DG HIP (WITH OR WITHOUT PELVIS) 2-3V RIGHT COMPARISON:  06/29/2020 FINDINGS: Bones are diffusely demineralized. SI joints and symphysis pubis unremarkable. Deformity of the left inferior pubic ramus suggests old trauma. AP and cross-table lateral views of the right hip show a right femoral neck fracture with varus angulation. IMPRESSION: Right femoral neck fracture with varus angulation. Electronically Signed   By: Donnal Fusi M.D.   On: 12/13/2023 14:04       Ganon Demasi M.D. [facility] 12/14/2023, 9:00 AM  Available via Epic secure chat 7am-7pm After 7 pm, please refer to night coverage provider listed on amion.

## 2023-12-14 NOTE — Anesthesia Preprocedure Evaluation (Addendum)
 Anesthesia Evaluation  Patient identified by MRN, date of birth, ID band Patient awake    Reviewed: Allergy & Precautions, H&P , NPO status , Patient's Chart, lab work & pertinent test results  Airway Mallampati: III  TM Distance: >3 FB Neck ROM: Full    Dental no notable dental hx. (+) Teeth Intact, Dental Advisory Given   Pulmonary sleep apnea    Pulmonary exam normal breath sounds clear to auscultation       Cardiovascular negative cardio ROS  Rhythm:Regular Rate:Normal     Neuro/Psych  Neuromuscular disease  negative psych ROS   GI/Hepatic Neg liver ROS,GERD  ,,  Endo/Other  negative endocrine ROS    Renal/GU negative Renal ROS  negative genitourinary   Musculoskeletal   Abdominal   Peds  Hematology negative hematology ROS (+)   Anesthesia Other Findings   Reproductive/Obstetrics negative OB ROS                             Anesthesia Physical Anesthesia Plan  ASA: 3  Anesthesia Plan: General   Post-op Pain Management: Ofirmev IV (intra-op)*, Ketamine IV* and Precedex   Induction: Intravenous  PONV Risk Score and Plan: 3 and Ondansetron, Dexamethasone, Propofol infusion and TIVA  Airway Management Planned: Oral ETT  Additional Equipment:   Intra-op Plan:   Post-operative Plan: Extubation in OR  Informed Consent: I have reviewed the patients History and Physical, chart, labs and discussed the procedure including the risks, benefits and alternatives for the proposed anesthesia with the patient or authorized representative who has indicated his/her understanding and acceptance.   Patient has DNR.  Discussed DNR with patient and Continue DNR.   Dental advisory given  Plan Discussed with: CRNA  Anesthesia Plan Comments:        Anesthesia Quick Evaluation

## 2023-12-15 ENCOUNTER — Encounter (HOSPITAL_COMMUNITY): Payer: Self-pay | Admitting: Orthopedic Surgery

## 2023-12-15 DIAGNOSIS — S72001A Fracture of unspecified part of neck of right femur, initial encounter for closed fracture: Secondary | ICD-10-CM | POA: Diagnosis not present

## 2023-12-15 DIAGNOSIS — W19XXXA Unspecified fall, initial encounter: Secondary | ICD-10-CM | POA: Diagnosis not present

## 2023-12-15 DIAGNOSIS — G20B2 Parkinson's disease with dyskinesia, with fluctuations: Secondary | ICD-10-CM | POA: Diagnosis not present

## 2023-12-15 LAB — RENAL FUNCTION PANEL
Albumin: 2.5 g/dL — ABNORMAL LOW (ref 3.5–5.0)
Anion gap: 6 (ref 5–15)
BUN: 7 mg/dL — ABNORMAL LOW (ref 8–23)
CO2: 22 mmol/L (ref 22–32)
Calcium: 8 mg/dL — ABNORMAL LOW (ref 8.9–10.3)
Chloride: 111 mmol/L (ref 98–111)
Creatinine, Ser: 0.4 mg/dL — ABNORMAL LOW (ref 0.61–1.24)
GFR, Estimated: >60 mL/min (ref >60)
Glucose, Bld: 115 mg/dL — ABNORMAL HIGH (ref 70–99)
Phosphorus: 2.9 mg/dL (ref 2.5–4.6)
Potassium: 3.5 mmol/L (ref 3.5–5.1)
Sodium: 139 mmol/L (ref 135–145)

## 2023-12-15 LAB — CBC
HCT: 32.3 % — ABNORMAL LOW (ref 39.0–52.0)
Hemoglobin: 10.9 g/dL — ABNORMAL LOW (ref 13.0–17.0)
MCH: 32.2 pg (ref 26.0–34.0)
MCHC: 33.7 g/dL (ref 30.0–36.0)
MCV: 95.6 fL (ref 80.0–100.0)
Platelets: 108 10*3/uL — ABNORMAL LOW (ref 150–400)
RBC: 3.38 MIL/uL — ABNORMAL LOW (ref 4.22–5.81)
RDW: 13.2 % (ref 11.5–15.5)
WBC: 9.3 10*3/uL (ref 4.0–10.5)
nRBC: 0 % (ref 0.0–0.2)

## 2023-12-15 MED ORDER — ASPIRIN 81 MG PO CHEW
81.0000 mg | CHEWABLE_TABLET | Freq: Two times a day (BID) | ORAL | Status: DC
  Administered 2023-12-15 – 2023-12-22 (×15): 81 mg via ORAL
  Filled 2023-12-15 (×15): qty 1

## 2023-12-15 MED ORDER — FAMOTIDINE 20 MG PO TABS
20.0000 mg | ORAL_TABLET | Freq: Every day | ORAL | Status: DC
  Administered 2023-12-15 – 2023-12-22 (×8): 20 mg via ORAL
  Filled 2023-12-15 (×8): qty 1

## 2023-12-15 MED ORDER — SENNOSIDES-DOCUSATE SODIUM 8.6-50 MG PO TABS
1.0000 | ORAL_TABLET | Freq: Two times a day (BID) | ORAL | Status: DC
  Administered 2023-12-15 – 2023-12-21 (×7): 1 via ORAL
  Filled 2023-12-15 (×13): qty 1

## 2023-12-15 MED ORDER — BISACODYL 5 MG PO TBEC
5.0000 mg | DELAYED_RELEASE_TABLET | Freq: Every day | ORAL | Status: DC | PRN
  Administered 2023-12-15: 5 mg via ORAL
  Filled 2023-12-15 (×2): qty 1

## 2023-12-15 NOTE — Progress Notes (Signed)
 Orthopedic Surgery Progress Note   Assessment: Patient is a 68 y.o. male with right femoral neck fracture s/p cemented hemiarthroplasty   Plan: -Operative plans: complete -Diet: regular -DVT ppx: aspirin 81mg  BID -Antibiotics: ancef x2 post-op doses -Weight bearing status: as tolerated with posterior hip precautions -PT evaluate and treat -Pain control -Dispo: per primary  ___________________________________________________________________________  Subjective: No acute events overnight. Was able to get some sleep last night. Not having much pain in his hip this morning.    Physical Exam:  General: no acute distress, appears stated age Neurologic: alert, answering questions appropriately, following commands Respiratory: unlabored breathing on room air, symmetric chest rise Psychiatric: appropriate affect, normal cadence to speech  MSK:   -Right lower extremity  Dressing over hip c/d/i EHL and TA not intact due to muscular dystrophy (chronic), GSC intact Plantarflexes toes Sensation intact to light touch in sural, saphenous, tibial, deep peroneal, and superficial peroneal nerve distributions Foot warm and well perfused, palpable DP pulse   Patient name: Luis Mcglinn Nest Jr. Patient MRN: [id number] Date: 12/15/23

## 2023-12-15 NOTE — Progress Notes (Signed)
 [facility] Liaison Note:  This patient is currently enrolled in [facility] outpatient-based palliative care.   Please call for any outpatient based palliative care related questions or concerns.  Thank you, Glenna Fellows, BSN, RN, OCN [facility] Liaison [phone]

## 2023-12-15 NOTE — Progress Notes (Signed)
 Emotional support given to patient & wife from 0940 to 1000

## 2023-12-15 NOTE — Plan of Care (Signed)

## 2023-12-15 NOTE — Plan of Care (Signed)

## 2023-12-15 NOTE — Progress Notes (Signed)
 Emotional support given to patient wife 1451 to 1520

## 2023-12-15 NOTE — Progress Notes (Signed)
 Emotional support given to patient & wife from 1816 to 59.

## 2023-12-15 NOTE — Progress Notes (Signed)
 [facility]                                                                              Luis [name], is a 68 y.o. male, DOB - 1956-08-03, ZOX:[id number] Admit date - 12/13/2023    Outpatient Primary MD for the patient is Emilio Aspen, MD  LOS - 2  days  Chief Complaint  Patient presents with   Fall       Brief summary   Patient is a 68 year old male with GERD, HLD, FSH muscular dystrophy, Parkinson's disease, spinal stenosis, chronic pain, OSA presented after falls at home and hip pain.  Patient reported first fall the night before the admission while walking to the bathroom with his walker, had significant pain in the right hip.  On the day of admission he fell again when he was unable to bear weight.  Family reported he did hit his head during the fall. CT head negative CT right hip showed slightly comminuted displaced fracture of the right femoral neck Orthopedics consulted  Assessment & Plan    Principal Problem:   Closed displaced fracture of right femoral neck (HCC) -Orthopedics consulted -Underwent right hip cemented hemiarthroplasty on 12/14/2023, postop day #1 -Continue pain control, bowel regimen - Started on aspirin 81 mg twice daily per orthopedics - Follow CBC, H&H stable for now, transfuse for hemoglobin less than 8  Active Problems: Leukocytosis - Resolved, possibly reactive   Parkinson's disease - Continue Sinemet - PT OT  OSA - Last recently diagnosed tolerating CPAP    FSH (facioscapulohumeral muscular dystrophy) (HCC), spinal stenosis Chronic severe neuropathic pain, chronic low back pain -Has been following neurology outpatient, Dr. Terrace Arabia -Patient has been on oral Dilaudid, gabapentin -PT OT after surgery  History of mucous membrane pemphigoid, - Has been following outpatient dermatology - Has been on doxycycline, prednisolone, Pepcid as needed   Estimated body mass index is 24.61 kg/m as calculated from the  following:   Height as of this encounter: 5' 10.98" (1.803 m).   Weight as of this encounter: 80 kg.  Code Status: Full code DVT Prophylaxis:  SCDs Start: 12/13/23 1514   Level of Care: Level of care: Telemetry Surgical Family Communication: Updated patient's wife at the bedside Disposition Plan:      Remains inpatient appropriate: Patient and wife requested CIR after surgery   Procedures:    Consultants:   Orthopedics  Antimicrobials:   Anti-infectives (From admission, onward)    Start     Dose/Rate Route Frequency Ordered Stop   12/14/23 1800  ceFAZolin (ANCEF) IVPB 2g/100 mL premix        2 g 200 mL/hr over 30 Minutes Intravenous Every 6 hours 12/14/23 1530 12/15/23 0606   12/14/23 1700  doxycycline (VIBRA-TABS) tablet 100 mg        100 mg Oral 2 times daily with meals 12/14/23 0911     12/14/23 1301  vancomycin (VANCOCIN) powder  Status:  Discontinued          As needed 12/14/23 1301 12/14/23 1335   12/14/23 0832  ceFAZolin (ANCEF) 2-4 GM/100ML-% IVPB  Status:  Discontinued  Note to Pharmacy: Crissie Sickles: cabinet override      12/14/23 1610 12/14/23 0841   12/13/23 1445  ceFAZolin (ANCEF) IVPB 2g/100 mL premix        2 g 200 mL/hr over 30 Minutes Intravenous To Surgery 12/13/23 1434 12/14/23 0937   12/13/23 1315  doxycycline (VIBRA-TABS) tablet 100 mg        100 mg Oral  Once 12/13/23 1312 12/13/23 1346          Medications  aspirin  81 mg Oral BID   carbidopa-levodopa  1 tablet Oral TID   doxycycline  100 mg Oral BID WC   gabapentin  600 mg Oral QID   nystatin  5 mL Oral TID   polyethylene glycol  17 g Oral QHS   prednisoLONE  22.5 mg Oral Daily   prednisoLONE acetate  1 drop Both Eyes Daily   senna-docusate  1 tablet Oral BID   timolol  1 drop Both Eyes BID   zolpidem  10 mg Oral QHS      Subjective:   Luis [name] was seen and examined today.  No acute complaints, pain is controlled, wife at the bedside.  No nausea vomiting, chest pain,  shortness of breath, no acute issues overnight.  Objective:   Vitals:   12/14/23 2022 12/15/23 0058 12/15/23 0442 12/15/23 0831  BP: 130/87 104/61 103/66 106/74  Pulse: 65 (!) 56 73 65  Resp: 20 19 19    Temp: 97.8 F (36.6 C) 99 F (37.2 C) 98 F (36.7 C) 98.5 F (36.9 C)  TempSrc: Oral Oral Oral Oral  SpO2: 99% 97% 93% 94%  Weight:      Height:        Intake/Output Summary (Last 24 hours) at 12/15/2023 1147 Last data filed at 12/15/2023 0444 Gross per 24 hour  Intake 1878.06 ml  Output 1975 ml  Net -96.94 ml     Wt Readings from Last 3 Encounters:  12/14/23 80 kg  10/10/23 68 kg  04/11/23 68 kg    Physical Exam General: Alert and oriented x 3, NAD Cardiovascular: S1 S2 clear, RRR.  Respiratory: CTAB, no wheezing Gastrointestinal: Soft, nontender, nondistended, NBS Ext: no pedal edema bilaterally Neuro: no new deficits, tremor, more pronounced on the right Psych: Normal affect     Data Reviewed:  I have personally reviewed following labs    CBC Lab Results  Component Value Date   WBC 9.3 12/15/2023   RBC 3.38 (L) 12/15/2023   HGB 10.9 (L) 12/15/2023   HCT 32.3 (L) 12/15/2023   MCV 95.6 12/15/2023   MCH 32.2 12/15/2023   PLT 108 (L) 12/15/2023   MCHC 33.7 12/15/2023   RDW 13.2 12/15/2023   LYMPHSABS 0.4 (L) 12/13/2023   MONOABS 0.5 12/13/2023   EOSABS 0.0 12/13/2023   BASOSABS 0.0 12/13/2023     Last metabolic panel Lab Results  Component Value Date   NA 139 12/15/2023   K 3.5 12/15/2023   CL 111 12/15/2023   CO2 22 12/15/2023   BUN 7 (L) 12/15/2023   CREATININE 0.40 (L) 12/15/2023   GLUCOSE 115 (H) 12/15/2023   GFRNONAA >60 12/15/2023   CALCIUM 8.0 (L) 12/15/2023   PHOS 2.9 12/15/2023   PROT 5.8 (L) 06/05/2022   ALBUMIN 2.5 (L) 12/15/2023   BILITOT 0.4 06/05/2022   ALKPHOS 120 06/05/2022   AST 20 06/05/2022   ALT 9 06/05/2022   ANIONGAP 6 12/15/2023    CBG (last 3)  No results for  input(s): "GLUCAP" in the last 72 hours.     Coagulation Profile: Recent Labs  Lab 12/13/23 1350  INR 1.1     Radiology Studies: I have personally reviewed the imaging studies  DG Pelvis 1-2 Views Result Date: 12/14/2023 CLINICAL DATA:  Postoperative EXAM: PELVIS - 1-2 VIEW COMPARISON:  Right hip x-ray 12/13/2023. FINDINGS: There is a new right hip arthroplasty in anatomic alignment. There is lateral hip soft tissue swelling, air and skin staples compatible with recent surgery. IMPRESSION: New right hip arthroplasty in anatomic alignment. Electronically Signed   By: Darliss Cheney M.D.   On: 12/14/2023 18:08   CT Head Wo Contrast Result Date: 12/13/2023 CLINICAL DATA:  Head trauma, minor (Age >= 65y). EXAM: CT HEAD WITHOUT CONTRAST TECHNIQUE: Contiguous axial images were obtained from the base of the skull through the vertex without intravenous contrast. RADIATION DOSE REDUCTION: This exam was performed according to the departmental dose-optimization program which includes automated exposure control, adjustment of the mA and/or kV according to patient size and/or use of iterative reconstruction technique. COMPARISON:  Head CT 12/23/2017 FINDINGS: Brain: There is no evidence of an acute infarct, intracranial hemorrhage, mass, midline shift, or extra-axial fluid collection. Cerebral volume is within normal limits for age with normal size of the ventricles. Vascular: No hyperdense vessel or unexpected calcification. Skull: No acute fracture or suspicious lesion. Sinuses/Orbits: Visualized paranasal sinuses and mastoid air cells are clear. Bilateral cataract extraction. Other: None. IMPRESSION: Negative head CT for age. Electronically Signed   By: Sebastian Ache M.D.   On: 12/13/2023 15:54   CT HIP RIGHT WO CONTRAST Result Date: 12/13/2023 CLINICAL DATA:  Preoperative planning. EXAM: CT OF THE RIGHT HIP WITHOUT CONTRAST TECHNIQUE: Multidetector CT imaging of the right hip was performed according to the standard protocol. Multiplanar CT image  reconstructions were also generated. RADIATION DOSE REDUCTION: This exam was performed according to the departmental dose-optimization program which includes automated exposure control, adjustment of the mA and/or kV according to patient size and/or use of iterative reconstruction technique. COMPARISON:  12/21/2023, 05/27/2022. FINDINGS: Bones/Joint/Cartilage There is a mildly-comminuted fracture femoral neck with overlapping and mild lateral displacement of the distal fracture fragment. No dislocation is seen. A stable region of sclerosis is noted in the proximal sacrum, unchanged from 2023. Mild degenerative changes are present at the right hip. Ligaments Suboptimally assessed by CT. Muscles and Tendons No intramuscular hematoma is seen. Marked muscular atrophy is noted in the proximal right lower extremity. Soft tissues No acute abnormality. IMPRESSION: Slightly comminuted displaced fracture of the right femoral neck. Electronically Signed   By: Thornell Sartorius M.D.   On: 12/13/2023 15:32   DG Chest 1 View Result Date: 12/13/2023 CLINICAL DATA:  Preoperative respiratory evaluation. EXAM: CHEST  1 VIEW COMPARISON:  03/04/2019 FINDINGS: Stable asymmetric elevation left hemidiaphragm. Cardiopericardial silhouette is at upper limits of normal for size. The lungs are clear without focal pneumonia, edema, pneumothorax or pleural effusion. No acute bony abnormality. IMPRESSION: Stable.  No acute findings. Electronically Signed   By: Kennith Center M.D.   On: 12/13/2023 14:04   DG Hip Unilat With Pelvis 2-3 Views Right Result Date: 12/13/2023 CLINICAL DATA:  Pain after a fall. EXAM: DG HIP (WITH OR WITHOUT PELVIS) 2-3V RIGHT COMPARISON:  06/29/2020 FINDINGS: Bones are diffusely demineralized. SI joints and symphysis pubis unremarkable. Deformity of the left inferior pubic ramus suggests old trauma. AP and cross-table lateral views of the right hip show a right femoral neck fracture with varus angulation. IMPRESSION:  Right femoral neck fracture with varus angulation. Electronically Signed   By: Donnal Fusi M.D.   On: 12/13/2023 14:04       Merlen Gurry M.D. [facility] 12/15/2023, 11:47 AM  Available via Epic secure chat 7am-7pm After 7 pm, please refer to night coverage provider listed on amion.

## 2023-12-16 DIAGNOSIS — S72001A Fracture of unspecified part of neck of right femur, initial encounter for closed fracture: Secondary | ICD-10-CM | POA: Diagnosis not present

## 2023-12-16 DIAGNOSIS — G20B2 Parkinson's disease with dyskinesia, with fluctuations: Secondary | ICD-10-CM | POA: Diagnosis not present

## 2023-12-16 DIAGNOSIS — W19XXXA Unspecified fall, initial encounter: Secondary | ICD-10-CM | POA: Diagnosis not present

## 2023-12-16 LAB — CBC
HCT: 34.3 % — ABNORMAL LOW (ref 39.0–52.0)
Hemoglobin: 11.3 g/dL — ABNORMAL LOW (ref 13.0–17.0)
MCH: 31.9 pg (ref 26.0–34.0)
MCHC: 32.9 g/dL (ref 30.0–36.0)
MCV: 96.9 fL (ref 80.0–100.0)
Platelets: 110 10*3/uL — ABNORMAL LOW (ref 150–400)
RBC: 3.54 MIL/uL — ABNORMAL LOW (ref 4.22–5.81)
RDW: 13.2 % (ref 11.5–15.5)
WBC: 7.1 10*3/uL (ref 4.0–10.5)
nRBC: 0 % (ref 0.0–0.2)

## 2023-12-16 LAB — RENAL FUNCTION PANEL
Albumin: 2.4 g/dL — ABNORMAL LOW (ref 3.5–5.0)
Anion gap: 7 (ref 5–15)
BUN: 6 mg/dL — ABNORMAL LOW (ref 8–23)
CO2: 25 mmol/L (ref 22–32)
Calcium: 8.4 mg/dL — ABNORMAL LOW (ref 8.9–10.3)
Chloride: 109 mmol/L (ref 98–111)
Creatinine, Ser: 0.54 mg/dL — ABNORMAL LOW (ref 0.61–1.24)
GFR, Estimated: >60 mL/min (ref >60)
Glucose, Bld: 102 mg/dL — ABNORMAL HIGH (ref 70–99)
Phosphorus: 2.8 mg/dL (ref 2.5–4.6)
Potassium: 4.2 mmol/L (ref 3.5–5.1)
Sodium: 141 mmol/L (ref 135–145)

## 2023-12-16 MED ORDER — MAGNESIUM CITRATE PO SOLN
1.0000 | Freq: Once | ORAL | Status: AC
  Administered 2023-12-16: 1 via ORAL
  Filled 2023-12-16: qty 296

## 2023-12-16 MED ORDER — HYDROMORPHONE HCL 1 MG/ML IJ SOLN
0.5000 mg | INTRAMUSCULAR | Status: DC | PRN
  Administered 2023-12-16: 1 mg via INTRAVENOUS
  Filled 2023-12-16: qty 1

## 2023-12-16 MED ORDER — FLEET ENEMA RE ENEM
1.0000 | ENEMA | Freq: Once | RECTAL | Status: AC
  Administered 2023-12-16: 1 via RECTAL
  Filled 2023-12-16: qty 1

## 2023-12-16 NOTE — Care Management Important Message (Signed)
 Important Message  Patient Details  Name: Luis [name]. MRN: [id number] Date of Birth: [birth year]   Important Message Given:  Yes - Medicare IM     Wynonia Hedges 12/16/2023, 3:21 PM

## 2023-12-16 NOTE — TOC Progression Note (Signed)
 Transition of Care (TOC) - Progression Note    Patient Details  Name: Luis [name] Marieta Shorten. MRN: [id number] Date of Birth: 1956-02-15  Transition of Care [facility]) CM/SW Contact  Edem Tiegs A [place], LCSW Phone Number: 12/16/2023, 4:57 PM  Clinical Narrative:     CSW met with pt and pt's wife Adell Hones at bedside. She requested CSW follow up with CIR to ask about option for placement versus SNF. If SNF, then requested [facility]. CSW to complete SNF workup in case CIR is not viable.   Discussed options for personal care services if needed in the future and to provide resources.    TOC will continue to follow.   Expected Discharge Plan: Skilled Nursing Facility Barriers to Discharge: SNF Pending bed offer, Continued Medical Work up  Expected Discharge Plan and Services       Living arrangements for the past 2 months: Single Family Home                                       Social Determinants of Health (SDOH) Interventions SDOH Screenings   Food Insecurity: No Food Insecurity (12/13/2023)  Housing: Low Risk  (12/13/2023)  Transportation Needs: No Transportation Needs (12/13/2023)  Utilities: Not At Risk (12/13/2023)  Depression (PHQ2-9): Low Risk  (07/14/2023)  Social Connections: Socially Integrated (12/13/2023)  Tobacco Use: Low Risk  (12/14/2023)    Readmission Risk Interventions     No data to display

## 2023-12-16 NOTE — Significant Event (Signed)
 Patient states that his CODE STATUS is DNR.  Confirmed with patient's wife Adell Hones Yera.  Melford Spotted.

## 2023-12-16 NOTE — Progress Notes (Signed)
 [facility]                                                                              Luis [name], is a 68 y.o. male, DOB - Sep 19, 1955, ZOX:[id number] Admit date - 12/13/2023    Outpatient Primary MD for the patient is Emilio Aspen, MD  LOS - 3  days  Chief Complaint  Patient presents with   Fall       Brief summary   Patient is a 68 year old male with GERD, HLD, FSH muscular dystrophy, Parkinson's disease, spinal stenosis, chronic pain, OSA presented after falls at home and hip pain.  Patient reported first fall the night before the admission while walking to the bathroom with his walker, had significant pain in the right hip.  On the day of admission he fell again when he was unable to bear weight.  Family reported he did hit his head during the fall. CT head negative CT right hip showed slightly comminuted displaced fracture of the right femoral neck Orthopedics consulted  Assessment & Plan    Principal Problem:   Closed displaced fracture of right femoral neck (HCC) -Orthopedics consulted -Underwent right hip cemented hemiarthroplasty on 12/14/2023, postop day # 2 -Continue aspirin 81 mg twice daily per orthopedics - H&H stable, transfuse for Hb <8 - PT OT evaluation, recommended SNF --Patient has been on oral Dilaudid outpatient, will continue IV Dilaudid for pain. Continue gabapentin  Active Problems: Leukocytosis - Resolved, possibly reactive   Parkinson's disease - Continue Sinemet - Continue PT OT  Constipation -Continue Senokot, MiraLAX, added mag citrate x 1  OSA - Last recently diagnosed tolerating CPAP    FSH (facioscapulohumeral muscular dystrophy) (HCC), spinal stenosis Chronic severe neuropathic pain, chronic low back pain -Has been following neurology outpatient, Dr. Terrace Arabia -Patient has been on oral Dilaudid, gabapentin, will continue IV Dilaudid for pain -PT OT  History of mucous membrane pemphigoid, - Has been  following outpatient dermatology - Has been on doxycycline, prednisolone, Pepcid as needed   Estimated body mass index is 24.61 kg/m as calculated from the following:   Height as of this encounter: 5' 10.98" (1.803 m).   Weight as of this encounter: 80 kg.  Code Status: Full code DVT Prophylaxis:  SCDs Start: 12/13/23 1514   Level of Care: Level of care: Telemetry Surgical Family Communication: Updated patient's wife at the bedside Disposition Plan:      Remains inpatient appropriate: Patient and wife requested CIR after surgery   Procedures:    Consultants:   Orthopedics  Antimicrobials:   Anti-infectives (From admission, onward)    Start     Dose/Rate Route Frequency Ordered Stop   12/14/23 1800  ceFAZolin (ANCEF) IVPB 2g/100 mL premix        2 g 200 mL/hr over 30 Minutes Intravenous Every 6 hours 12/14/23 1530 12/15/23 0606   12/14/23 1700  doxycycline (VIBRA-TABS) tablet 100 mg        100 mg Oral 2 times daily with meals 12/14/23 0911     12/14/23 1301  vancomycin (VANCOCIN) powder  Status:  Discontinued  As needed 12/14/23 1301 12/14/23 1335   12/14/23 0832  ceFAZolin (ANCEF) 2-4 GM/100ML-% IVPB  Status:  Discontinued       Note to Pharmacy: Crissie Sickles: cabinet override      12/14/23 0832 12/14/23 0841   12/13/23 1445  ceFAZolin (ANCEF) IVPB 2g/100 mL premix        2 g 200 mL/hr over 30 Minutes Intravenous To Surgery 12/13/23 1434 12/14/23 0937   12/13/23 1315  doxycycline (VIBRA-TABS) tablet 100 mg        100 mg Oral  Once 12/13/23 1312 12/13/23 1346          Medications  aspirin  81 mg Oral BID   carbidopa-levodopa  1 tablet Oral TID   doxycycline  100 mg Oral BID WC   famotidine  20 mg Oral Daily   gabapentin  600 mg Oral QID   magnesium citrate  1 Bottle Oral Once   nystatin  5 mL Oral TID   polyethylene glycol  17 g Oral QHS   prednisoLONE  22.5 mg Oral Daily   prednisoLONE acetate  1 drop Both Eyes Daily   senna-docusate  1 tablet  Oral BID   timolol  1 drop Both Eyes BID   zolpidem  10 mg Oral QHS      Subjective:   Luis [name] was seen and examined today.  Did not sleep well last night, wife at the bedside.  Also complaining of constipation.  Pain is controlled with the Dilaudid. No nausea vomiting, chest pain, shortness of breath, no acute issues overnight.  Objective:   Vitals:   12/15/23 0831 12/15/23 1943 12/16/23 0426 12/16/23 0835  BP: 106/74 119/82 119/83 113/72  Pulse: 65 67 62 60  Resp:  18 18 16   Temp: 98.5 F (36.9 C) 98.4 F (36.9 C) 98.3 F (36.8 C) 98.3 F (36.8 C)  TempSrc: Oral Oral Oral Oral  SpO2: 94% 96% 97% 98%  Weight:      Height:        Intake/Output Summary (Last 24 hours) at 12/16/2023 1034 Last data filed at 12/16/2023 0034 Gross per 24 hour  Intake --  Output 2000 ml  Net -2000 ml     Wt Readings from Last 3 Encounters:  12/14/23 80 kg  10/10/23 68 kg  04/11/23 68 kg   Physical Exam General: Alert and oriented x 3, NAD Cardiovascular: S1 S2 clear, RRR.  Respiratory: CTAB, no wheezing Gastrointestinal: Soft, nontender, nondistended, NBS Ext: no pedal edema bilaterally Neuro: no new deficits Psych: Normal affect    Data Reviewed:  I have personally reviewed following labs    CBC Lab Results  Component Value Date   WBC 7.1 12/16/2023   RBC 3.54 (L) 12/16/2023   HGB 11.3 (L) 12/16/2023   HCT 34.3 (L) 12/16/2023   MCV 96.9 12/16/2023   MCH 31.9 12/16/2023   PLT 110 (L) 12/16/2023   MCHC 32.9 12/16/2023   RDW 13.2 12/16/2023   LYMPHSABS 0.4 (L) 12/13/2023   MONOABS 0.5 12/13/2023   EOSABS 0.0 12/13/2023   BASOSABS 0.0 12/13/2023     Last metabolic panel Lab Results  Component Value Date   NA 141 12/16/2023   K 4.2 12/16/2023   CL 109 12/16/2023   CO2 25 12/16/2023   BUN 6 (L) 12/16/2023   CREATININE 0.54 (L) 12/16/2023   GLUCOSE 102 (H) 12/16/2023   GFRNONAA >60 12/16/2023   CALCIUM 8.4 (L) 12/16/2023   PHOS 2.8 12/16/2023   PROT  5.8 (L)  06/05/2022   ALBUMIN 2.4 (L) 12/16/2023   BILITOT 0.4 06/05/2022   ALKPHOS 120 06/05/2022   AST 20 06/05/2022   ALT 9 06/05/2022   ANIONGAP 7 12/16/2023    CBG (last 3)  No results for input(s): "GLUCAP" in the last 72 hours.    Coagulation Profile: Recent Labs  Lab 12/13/23 1350  INR 1.1     Radiology Studies: I have personally reviewed the imaging studies  DG Pelvis 1-2 Views Result Date: 12/14/2023 CLINICAL DATA:  Postoperative EXAM: PELVIS - 1-2 VIEW COMPARISON:  Right hip x-ray 12/13/2023. FINDINGS: There is a new right hip arthroplasty in anatomic alignment. There is lateral hip soft tissue swelling, air and skin staples compatible with recent surgery. IMPRESSION: New right hip arthroplasty in anatomic alignment. Electronically Signed   By: Tyron Gallon M.D.   On: 12/14/2023 18:08       Aliyyah Riese M.D. [facility] 12/16/2023, 10:34 AM  Available via Epic secure chat 7am-7pm After 7 pm, please refer to night coverage provider listed on amion.

## 2023-12-16 NOTE — Evaluation (Signed)
 Occupational Therapy Evaluation Patient Details Name: Luis [name] Marieta Shorten. MRN: [id number] DOB: 1956/01/11 Today's Date: 12/16/2023   History of Present Illness   Pt is a 68 y.o. male admitted 4/12 following a fall at home. Imaging revealed R hip fx. He underwent R hip hemiarthroplasty 4/13. PMH:  hyperlipidemia, GERD, FSH muscular dystrophy, Parkinson disease, spinal stenosis, chronic pain, OSA     Clinical Impressions Pt c/o 9/10 pain to R hip at rest, pain meds given during session. Pt wife present during session. Pt lives at home with wife who takes care of all needs, assists with all ADLs, Pt uses lift to perform STS and walks precariously with RW at baseline. Pt currently max to total assist for ADLs at bed level, heavy max A x2 assist for supine to sit at EOB, Pt not able to assist with RLE positioning, would benefit from two people working with Pt to maintain hip precautions. Recommending postacute rehab <3hrs/day to maximize functional strength for bed mobility and transfers, will continue to see acutely to progress as able.     If plan is discharge home, recommend the following:   Two people to help with walking and/or transfers;Two people to help with bathing/dressing/bathroom;Assistance with cooking/housework;Assist for transportation;Help with stairs or ramp for entrance     Functional Status Assessment   Patient has had a recent decline in their functional status and demonstrates the ability to make significant improvements in function in a reasonable and predictable amount of time.     Equipment Recommendations   None recommended by OT     Recommendations for Other Services         Precautions/Restrictions   Precautions Precautions: Fall;Posterior Hip Precaution Booklet Issued: Yes (comment) Recall of Precautions/Restrictions: Impaired Precaution/Restrictions Comments: cues needed to recall hip precautions at end of session Restrictions Weight Bearing  Restrictions Per Provider Order: Yes RLE Weight Bearing Per Provider Order: Weight bearing as tolerated Other Position/Activity Restrictions: abd pillow in bed     Mobility Bed Mobility Overal bed mobility: Needs Assistance Bed Mobility: Supine to Sit Rolling: Max assist, +2 for physical assistance, +2 for safety/equipment         General bed mobility comments: heavy max A x2 for in/out of bed    Transfers                   General transfer comment: not attempted      Balance Overall balance assessment: Needs assistance                                         ADL either performed or assessed with clinical judgement   ADL Overall ADL's : Needs assistance/impaired Eating/Feeding: Set up;Bed level   Grooming: Moderate assistance;Bed level   Upper Body Bathing: Maximal assistance;Bed level   Lower Body Bathing: Total assistance;Bed level   Upper Body Dressing : Maximal assistance;Bed level   Lower Body Dressing: Total assistance;Bed level       Toileting- Clothing Manipulation and Hygiene: Total assistance;Bed level         General ADL Comments: max to total at bed level at this time, little to no AROM to RUE/RLE, max A x2 to assist to EOB.     Vision         Perception         Praxis         Pertinent  Vitals/Pain Pain Assessment Pain Assessment: 0-10 Pain Score: 9  Pain Location: R hip Pain Descriptors / Indicators: Sore Pain Intervention(s): Monitored during session     Extremity/Trunk Assessment Upper Extremity Assessment Upper Extremity Assessment: RUE deficits/detail;LUE deficits/detail RUE Deficits / Details: muscular dystrophy and PKD limits RUE worse than LUE, little to no AROM, able to perform gross grip, no elbow/shoudler AROM. RUE Sensation: WNL RUE Coordination: decreased gross motor;decreased fine motor LUE Deficits / Details: muscular dystrophy and PKD limits AROM, little to no AROM at shoulder, able to  perform elbow flexion and hand/wrist activities well with L hand. LUE Sensation: WNL LUE Coordination: decreased gross motor   Lower Extremity Assessment Lower Extremity Assessment: Defer to PT evaluation RLE Deficits / Details: s/p hip hemiarthroplasty, chronic foot drop bilat RLE Sensation: WNL LLE Deficits / Details: chronic foot drop bilat LLE Sensation: WNL       Communication Communication Communication: No apparent difficulties   Cognition Arousal: Alert Behavior During Therapy: WFL for tasks assessed/performed, Flat affect Cognition: No apparent impairments             OT - Cognition Comments: Pt A/Ox4, alert, able to answer questions accurately, recalls events appropriately.                 Following commands: Intact       Cueing  General Comments      VSS on RA   Exercises     Shoulder Instructions      Home Living Family/patient expects to be discharged to:: Private residence Living Arrangements: Spouse/significant other Available Help at Discharge: Family;Available 24 hours/day Type of Home: House Home Access: Stairs to enter [employer] of Steps: stair lift   Home Layout: One level     Bathroom Shower/Tub: Sponge bathes at baseline   Bathroom Toilet: Handicapped height     Home Equipment: Agricultural consultant (2 wheels);Toilet riser;Shower seat;Grab bars - tub/shower;Grab bars - toilet;Wheelchair - manual;Lift chair;Hospital bed   Additional Comments: Pt lives with wife who assists with all needs.      Prior Functioning/Environment Prior Level of Function : Needs assist           ADLs (physical): Bathing;Grooming;Dressing;IADLs Mobility Comments: walks with RW in highest setting, resting on forearms with trunk at near 90 degrees. Pt has w/c but it doesn't fit in the house. ADLs Comments: Wife assists with all ADLs    OT Problem List: Decreased strength;Decreased range of motion;Impaired balance (sitting and/or  standing);Decreased activity tolerance;Pain;Impaired UE functional use   OT Treatment/Interventions: Self-care/ADL training;Therapeutic exercise;Energy conservation;DME and/or AE instruction;Therapeutic activities;Patient/family education;Balance training      OT Goals(Current goals can be found in the care plan section)   Acute Rehab OT Goals Patient Stated Goal: to manage pain, improve strength OT Goal Formulation: With patient/family Time For Goal Achievement: 12/30/23 Potential to Achieve Goals: Good   OT Frequency:  Min 2X/week    Co-evaluation              AM-PAC OT "6 Clicks" Daily Activity     Outcome Measure Help from another person eating meals?: A Little Help from another person taking care of personal grooming?: A Lot Help from another person toileting, which includes using toliet, bedpan, or urinal?: Total Help from another person bathing (including washing, rinsing, drying)?: A Lot Help from another person to put on and taking off regular upper body clothing?: A Lot Help from another person to put on and taking off regular lower body clothing?: Total  6 Click Score: 11   End of Session Nurse Communication: Mobility status  Activity Tolerance: Patient limited by pain Patient left: in bed;with call bell/phone within reach;with family/visitor present  OT Visit Diagnosis: Unsteadiness on feet (R26.81);Other abnormalities of gait and mobility (R26.89);Muscle weakness (generalized) (M62.81);History of falling (Z91.81);Pain Pain - Right/Left: Right Pain - part of body: Hip                Time: 1325-1405 OT Time Calculation (min): 40 min Charges:  OT General Charges $OT Visit: 1 Visit OT Evaluation $OT Eval High Complexity: 1 High OT Treatments $Self Care/Home Management : 8-22 mins $Therapeutic Activity: 8-22 mins  Octavion Mollenkopf, OTR/L   Scherry Curtis 12/16/2023, 2:13 PM

## 2023-12-16 NOTE — Progress Notes (Signed)
 Inpatient Rehabilitation Admissions Coordinator   Per wife's request, I will place Rehab consult for full assessment of candidacy for CIR admit. He is a patient of Dr Roxan Copes as an outpatient. Has palliative care assist at home per chart review.  Jeannetta Millman, RN, MSN Rehab Admissions Coordinator [phone] 12/16/2023 5:09 PM

## 2023-12-16 NOTE — Evaluation (Signed)
 Physical Therapy Evaluation Patient Details Name: Luis [name] Marieta Shorten. MRN: [id number] DOB: October 06, 1955 Today's Date: 12/16/2023  History of Present Illness  Pt is a 68 y.o. male admitted 4/12 following a fall at home. Imaging revealed R hip fx. He underwent R hip hemiarthroplasty 4/13. PMH:  hyperlipidemia, GERD, FSH muscular dystrophy, Parkinson disease, spinal stenosis, chronic pain, OSA   Clinical Impression  Pt admitted with above diagnosis. PTA pt lived at home with wife, amb short household distances with RW and assist. Only able to stand from elevated surfaces (i.e. hospital bed, lift chair, electric toilet lifter). Pt currently with functional limitations due to the deficits listed below (see PT Problem List). On eval, pt required heavy max assist bed mobility, and max assist sit to stand x 2 trials in stedy. Pt able to clear bottom from bed during stand but trunk maintained approx 90 degrees of flexion. Pt/wife educated on 3/3 hip precautions. Pt will benefit from acute skilled PT to increase their independence and safety with mobility to allow discharge. Post acute, pt would benefit from further therapy in inpatient setting < 3 hours/day.           If plan is discharge home, recommend the following: Two people to help with walking and/or transfers;Two people to help with bathing/dressing/bathroom   Can travel by private vehicle   No    Equipment Recommendations Other (comment) (TBD)  Recommendations for Other Services       Functional Status Assessment Patient has had a recent decline in their functional status and demonstrates the ability to make significant improvements in function in a reasonable and predictable amount of time.     Precautions / Restrictions Precautions Precautions: Fall;Posterior Hip Recall of Precautions/Restrictions: Impaired Precaution/Restrictions Comments: cues needed to recall hip precautions at end of session Restrictions RLE Weight Bearing Per  Provider Order: Weight bearing as tolerated Other Position/Activity Restrictions: abd pillow in bed      Mobility  Bed Mobility Overal bed mobility: Needs Assistance Bed Mobility: Rolling, Supine to Sit, Sit to Supine Rolling: Max assist, Used rails   Supine to sit: Max assist, HOB elevated, Used rails Sit to supine: Max assist, Used rails   General bed mobility comments: heavy max assist for all aspects of mobility    Transfers Overall transfer level: Needs assistance   Transfers: Sit to/from Stand Sit to Stand: Max assist           General transfer comment: STS x 2 trials in stedy. Able to clear bottom but trunk flexed at 90 degrees. Transfer via Lift Equipment: Stedy  Ambulation/Gait                  Stairs            Wheelchair Mobility     Tilt Bed    Modified Rankin (Stroke Patients Only)       Balance Overall balance assessment: Needs assistance Sitting-balance support: Feet supported, Single extremity supported Sitting balance-Leahy Scale: Fair     Standing balance support: Bilateral upper extremity supported, Reliant on assistive device for balance, During functional activity Standing balance-Leahy Scale: Poor Standing balance comment: static stand in stedy, 45 seconds 1st trial and 1.5 minutes 2nd trial.                             Pertinent Vitals/Pain Pain Assessment Pain Assessment: 0-10 Pain Score: 7  Pain Location: R hip Pain Descriptors / Indicators: Sore  Pain Intervention(s): Limited activity within patient's tolerance, Monitored during session, RN gave pain meds during session, Repositioned    Home Living Family/patient expects to be discharged to:: Private residence Living Arrangements: Spouse/significant other Available Help at Discharge: Family;Available 24 hours/day Type of Home: House Home Access: Stairs to enter   [employer] Steps: stair lift   Home Layout: One level Home  Equipment: Agricultural consultant (2 wheels);Toilet riser;Shower seat;Grab bars - tub/shower;Grab bars - toilet;Wheelchair - manual;Lift chair;Hospital bed      Prior Function Prior Level of Function : Needs assist       Physical Assist : Mobility (physical);ADLs (physical) Mobility (physical): Bed mobility;Gait;Transfers ADLs (physical): Bathing;Grooming;Dressing;IADLs Mobility Comments: walks with RW in highest setting, resting on forearms with trunk at near 90 degrees. Pt has w/c but it doesn't fit in the house. ADLs Comments: Wife assists with all ADLs     Extremity/Trunk Assessment   Upper Extremity Assessment Upper Extremity Assessment: Defer to OT evaluation    Lower Extremity Assessment Lower Extremity Assessment: LLE deficits/detail;Generalized weakness;RLE deficits/detail RLE Deficits / Details: s/p hip hemiarthroplasty, chronic foot drop bilat RLE Sensation: WNL LLE Deficits / Details: chronic foot drop bilat LLE Sensation: WNL    Cervical / Trunk Assessment Cervical / Trunk Assessment: Kyphotic  Communication   Communication Communication: No apparent difficulties    Cognition Arousal: Alert Behavior During Therapy: WFL for tasks assessed/performed, Flat affect   PT - Cognitive impairments: Memory                       PT - Cognition Comments: difficulty recalling hip precautions after education Following commands: Intact       Cueing       General Comments General comments (skin integrity, edema, etc.): VSS on RA    Exercises     Assessment/Plan    PT Assessment Patient needs continued PT services  PT Problem List Decreased strength;Decreased balance;Decreased knowledge of precautions;Pain;Decreased mobility;Decreased activity tolerance       PT Treatment Interventions Functional mobility training;Balance training;Patient/family education;Gait training;Therapeutic activities;Therapeutic exercise    PT Goals (Current goals can be found in  the Care Plan section)  Acute Rehab PT Goals Patient Stated Goal: home PT Goal Formulation: With patient/family Time For Goal Achievement: 12/30/23 Potential to Achieve Goals: Good    Frequency Min 2X/week     Co-evaluation               AM-PAC PT "6 Clicks" Mobility  Outcome Measure Help needed turning from your back to your side while in a flat bed without using bedrails?: A Lot Help needed moving from lying on your back to sitting on the side of a flat bed without using bedrails?: Total Help needed moving to and from a bed to a chair (including a wheelchair)?: Total Help needed standing up from a chair using your arms (e.g., wheelchair or bedside chair)?: Total Help needed to walk in hospital room?: Total Help needed climbing 3-5 steps with a railing? : Total 6 Click Score: 7    End of Session Equipment Utilized During Treatment: Gait belt;Other (comment) (abd pillow in bed) Activity Tolerance: Patient tolerated treatment well Patient left: in bed;with call bell/phone within reach;with family/visitor present Nurse Communication: Mobility status PT Visit Diagnosis: Other abnormalities of gait and mobility (R26.89);Muscle weakness (generalized) (M62.81);Pain Pain - Right/Left: Right Pain - part of body: Hip    Time: 1610-9604 PT Time Calculation (min) (ACUTE ONLY): 55 min   Charges:  PT Evaluation $PT Eval Moderate Complexity: 1 Mod PT Treatments $Therapeutic Activity: 23-37 mins PT General Charges $$ ACUTE PT VISIT: 1 Visit         Dorothye Gathers., PT  Office # [phone]   Guadelupe Leech 12/16/2023, 10:25 AM

## 2023-12-16 NOTE — Plan of Care (Signed)

## 2023-12-17 DIAGNOSIS — S72001A Fracture of unspecified part of neck of right femur, initial encounter for closed fracture: Secondary | ICD-10-CM | POA: Diagnosis not present

## 2023-12-17 DIAGNOSIS — G20A1 Parkinson's disease without dyskinesia, without mention of fluctuations: Secondary | ICD-10-CM | POA: Diagnosis not present

## 2023-12-17 DIAGNOSIS — G7102 Facioscapulohumeral muscular dystrophy: Secondary | ICD-10-CM | POA: Diagnosis not present

## 2023-12-17 LAB — CBC
HCT: 37 % — ABNORMAL LOW (ref 39.0–52.0)
Hemoglobin: 12.5 g/dL — ABNORMAL LOW (ref 13.0–17.0)
MCH: 31.9 pg (ref 26.0–34.0)
MCHC: 33.8 g/dL (ref 30.0–36.0)
MCV: 94.4 fL (ref 80.0–100.0)
Platelets: 141 10*3/uL — ABNORMAL LOW (ref 150–400)
RBC: 3.92 MIL/uL — ABNORMAL LOW (ref 4.22–5.81)
RDW: 13 % (ref 11.5–15.5)
WBC: 7.2 10*3/uL (ref 4.0–10.5)
nRBC: 0 % (ref 0.0–0.2)

## 2023-12-17 LAB — RENAL FUNCTION PANEL
Albumin: 2.6 g/dL — ABNORMAL LOW (ref 3.5–5.0)
Anion gap: 11 (ref 5–15)
BUN: 11 mg/dL (ref 8–23)
CO2: 26 mmol/L (ref 22–32)
Calcium: 8.6 mg/dL — ABNORMAL LOW (ref 8.9–10.3)
Chloride: 104 mmol/L (ref 98–111)
Creatinine, Ser: 0.37 mg/dL — ABNORMAL LOW (ref 0.61–1.24)
GFR, Estimated: >60 mL/min (ref >60)
Glucose, Bld: 93 mg/dL (ref 70–99)
Phosphorus: 4 mg/dL (ref 2.5–4.6)
Potassium: 3.8 mmol/L (ref 3.5–5.1)
Sodium: 141 mmol/L (ref 135–145)

## 2023-12-17 MED ORDER — HYDROMORPHONE HCL 2 MG PO TABS
10.0000 mg | ORAL_TABLET | Freq: Four times a day (QID) | ORAL | Status: DC
  Administered 2023-12-17 – 2023-12-22 (×21): 10 mg via ORAL
  Filled 2023-12-17 (×21): qty 5

## 2023-12-17 MED ORDER — HYDROMORPHONE HCL 2 MG PO TABS: 10.0000 mg | ORAL_TABLET | Freq: Four times a day (QID) | ORAL | Status: DC

## 2023-12-17 MED ORDER — HYDROMORPHONE HCL 2 MG PO TABS: 8.0000 mg | ORAL_TABLET | Freq: Four times a day (QID) | ORAL | Status: DC

## 2023-12-17 NOTE — Plan of Care (Signed)
   Problem: Education: Goal: Knowledge of General Education information will improve Description Including pain rating scale, medication(s)/side effects and non-pharmacologic comfort measures Outcome: Progressing

## 2023-12-17 NOTE — Progress Notes (Signed)
  Progress Note   Patient: Luis [name] [employer]. NGE:[id number] DOB: Dec 08, 1955 DOA: 12/13/2023     4 DOS: the patient was seen and examined on 12/17/2023   Brief hospital course: Patient is a 68 year old male with GERD, HLD, FSH muscular dystrophy, Parkinson's disease, spinal stenosis, chronic pain, OSA presented after falls at home and hip pain.  Patient reported first fall the night before the admission while walking to the bathroom with his walker, had significant pain in the right hip.  On the day of admission he fell again when he was unable to bear weight.  Family reported he did hit his head during the fall.  CT right hip showed slightly comminuted displaced fracture of the right femoral neck  Assessment and Plan:  Closed displaced fracture of the right femoral neck - Status post hemiarthroplasty/13.  Orthopedic following closely.  Aspirin 81 mg twice daily.  PT/OT recommending SNF.  Continue home oral Dilaudid.  FSH (facioscapulohumeral muscular dystrophy)/spinal stenosis/Chronic severe neuropathic pain/chronic low back pain - Followed closely by neurology in the outpatient setting.  Home Dilaudid, gabapentin.  PT/OT on board.  Parkinson's disease - Sinemet on board.  Constipation - Senokot, MiraLAX, mag citrate x 1.  Appears to be resolved.  Goals of care - Working closely with TOC, orthopedic surgery, patient and wife on disposition planning.  Possibility of transition to CIR versus SNF at [facility].     Subjective: Patient resting comfortably in bed.  Pain with movement, getting ready to work with PT this morning.  Denies any fever, chills, shortness of breath, chest pain, nausea, vomiting, abdominal pain.  Wife at bedside, very pleasant, updated on patient's status and goals of disposition.  Physical Exam: Vitals:   12/17/23 0825 12/17/23 1030 12/17/23 1100 12/17/23 1327  BP: (!) 161/96 (!) 179/102 (!) 150/98 109/80  Pulse: (!) 57 68 60 63  Resp: 18   18  Temp: (!) 97.5 F (36.4  C)   (!) 97.3 F (36.3 C)  TempSrc: Oral   Oral  SpO2: 100% 98%  97%  Weight:      Height:       GENERAL:  Alert, pleasant, no acute distress  HEENT:  EOMI CARDIOVASCULAR:  RRR, no murmurs appreciated RESPIRATORY:  Clear to auscultation, no wheezing, rales, or rhonchi GASTROINTESTINAL:  Soft, nontender, nondistended EXTREMITIES: Mild edema BL LE NEURO:  No new focal deficits appreciated SKIN:  No rashes noted PSYCH:  Appropriate mood and affect   Data Reviewed:  There are no new results to review at this time.  Family Communication: Wife at bedside  Disposition: Status is: Inpatient Remains inpatient appropriate because: Right hip fracture  Planned Discharge Destination: Skilled nursing facility    Time spent: 36 minutes  Author: Jodeane Mulligan, DO 12/17/2023 3:51 PM  For on call review [email or web address].

## 2023-12-17 NOTE — Progress Notes (Signed)
 Orthopedic Surgery Progress Note   Assessment: Patient is a 68 y.o. male with right femoral neck fracture s/p cemented hemiarthroplasty   Plan: -Operative plans: complete -Diet: regular -Placed him back on his home oral dilaudid -DVT ppx: aspirin 81mg  BID -Antibiotics: ancef x2 post-op doses -Weight bearing status: as tolerated with posterior hip precautions -PT evaluate and treat -Pain control -Dispo: per primary  ___________________________________________________________________________  Subjective: No acute events overnight. Worked with PT yesterday. Was able to stand with them and transfer but did not do any walking. Having pain in his hip. No numbness or paresthesias.    Physical Exam:  General: no acute distress, appears stated age, laying in bed  Neurologic: alert, answering questions appropriately, following commands Respiratory: unlabored breathing on room air, symmetric chest rise Psychiatric: appropriate affect, normal cadence to speech  MSK:   -Right lower extremity  Dressing over hip c/d/i EHL and TA not intact due to muscular dystrophy (chronic), GSC intact Plantarflexes toes Sensation intact to light touch in sural, saphenous, tibial, deep peroneal, and superficial peroneal nerve distributions Foot warm and well perfused, palpable DP pulse   Patient name: Luis Monts Six Jr. Patient MRN: [id number] Date: 12/17/23

## 2023-12-17 NOTE — Progress Notes (Signed)
  Inpatient Rehabilitation Admissions Coordinator   Met with patient and spouse at bedside for rehab assessment. Patient previously at [facility] 10/23 for 8 day admit after L 3 compression fracture. Discharged home at supervision level. Sees Dr Raynaldo Call in the outpatient clinic. We discussed goals and expectations of a possible CIR admit. Wife attempting to clarify if most appropriate rehab venue would be CIR vs SNF at [facility]. I will discuss with our Rehab MD his case and request Rehab MD for bedside assessment. Please call me with any questions.   Jeannetta Millman, RN, MSN Rehab Admissions Coordinator [phone]

## 2023-12-17 NOTE — Progress Notes (Signed)
 Physical Therapy Treatment Patient Details Name: Luis [name]. MRN: [id number] DOB: May 12, 1956 Today's Date: 12/17/2023   History of Present Illness Pt is a 68 y.o. male admitted 4/12 following a fall at home. Imaging revealed R hip fx. He underwent R hip hemiarthroplasty 4/13. PMH:  hyperlipidemia, GERD, FSH muscular dystrophy, Parkinson disease, spinal stenosis, chronic pain, OSA.    PT Comments  Pt received in supine, pleasantly agreeable to therapy session after RN premedicated him for pain. Pt with good effort and participation in bed mobility and seated exercises/transfer trial, c/o mild lightheaded or wooziness initially at EOB and BP noted to be elevated. Pt performs bed mobility and transfer trials from very elevated surface height with +2 maxA to stand and up to totalA to return to supine as he fatigued. PTA re-checked BP after sit<>stand trial in [place] but BP more elevated, so defer mechanical lift OOB this date to chair as had been planned due to unstable VS, RN/MD notified of BP readings. BP slightly improved once pt in supine with HOB ~50 degrees but still elevated. Pt would benefit from Prevalon boots in supine due to hip precs and chronic foot drop as well as to reduce risk of pressure injury, care team notified as well about his VS and tolerance for activity. Patient will benefit from continued inpatient follow up therapy, <3 hours/day. Orthostatic Sitting  BP- Sitting (EOB prior to standing trial) (!) 167/103  Pulse- Sitting 65   Vital Signs  Pulse Rate 68  BP (!) 179/102  BP Location Left Arm  BP Method Automatic  Patient Position (if appropriate) Sitting (edge of bed after standing trial)   Orthostatic Lying -HOB ~50 deg in supine  BP- Lying (after return to supine) (!) 150/98  Pulse- Lying 60        If plan is discharge home, recommend the following: Two people to help with walking and/or transfers;Two people to help with bathing/dressing/bathroom (spouse assist  with iADL/ADL at baseline)   Can travel by private vehicle     No  Equipment Recommendations  Other (comment) (TBD, at current level pt would need hoyer lift for home)    Recommendations for Other Services       Precautions / Restrictions Precautions Precautions: Fall;Posterior Hip Precaution Booklet Issued: Yes (comment) Recall of Precautions/Restrictions: Impaired Precaution/Restrictions Comments: Pt recalls 2/3 PHP at beginning of session, handout printed to display in room for staff and pt awareness as well. Required Braces or Orthoses:  (pt has hip abduction pillow to wear in evenings (spouse defers to place at end of session due to pt recent loose stools after his enema)) Restrictions Weight Bearing Restrictions Per Provider Order: Yes RLE Weight Bearing Per Provider Order: Weight bearing as tolerated Other Position/Activity Restrictions: abd pillow in bed     Mobility  Bed Mobility Overal bed mobility: Needs Assistance Bed Mobility: Supine to Sit     Supine to sit: Max assist, HOB elevated, Used rails, +2 for physical assistance Sit to supine: Total assist, +2 for physical assistance   General bed mobility comments: to L EOB with use of hospital bed features, pt able to assist with moving his legs toward EOB but needs heavy assist to elevate trunk from bed along with use of rails, and hand over hand assist for cross-body reaching with RUE due to weakness, pt making good effort to assist as able. +2 maxA to totalA to advance his hips forward via lateral leaning/contralateral hip scooting with bed pad assist as pt  not able to flex forward due to hip precs.    Transfers Overall transfer level: Needs assistance Equipment used: Ambulation equipment used Transfers: Sit to/from Stand, Bed to chair/wheelchair/BSC Sit to Stand: Max assist, +2 physical assistance, From elevated surface, Via lift equipment          Lateral/Scoot Transfers: Total assist, +2 physical  assistance, From elevated surface General transfer comment: elevated bed>Stedy x1 trial, pt maintains significant trunk flexion, slightly improves with cues to "look out the window", good effort to extend his knees. Will not be safe to transfer OOB via Stedy as he can't extend his hips/trunk enough to place flaps behind him. Defer transfer OOB to chair via hoyer pad due to elevated BP after standing, RN/MD notified. Pt needing totalA +2 for single attempt at lateral seated scoot toward [facility] but pt not able to assist with UE/LE due to weakness/dystrophy and posterior hip precs so bed pad utilized. Transfer via Lift Equipment: Stedy  Ambulation/Gait                   Stairs             Wheelchair Mobility     Tilt Bed    Modified Rankin (Stroke Patients Only)       Balance Overall balance assessment: Needs assistance Sitting-balance support: Feet supported, Single extremity supported, No upper extremity supported Sitting balance-Leahy Scale: Poor Sitting balance - Comments: Fair static sitting without challenge, poor dynamic seated balance, tendency to posterior lean with seated LAQ Postural control: Posterior lean Standing balance support: Bilateral upper extremity supported, Reliant on assistive device for balance Standing balance-Leahy Scale: Zero Standing balance comment: <60 seconds for single trial of STS from elevated bed>Stedy with +2 mod/maxA to maintain and pt significantly flexed at trunk despite multimodal cues. Not able to weight shift in stance today.                            Communication Communication Communication: No apparent difficulties  Cognition Arousal: Alert Behavior During Therapy: WFL for tasks assessed/performed, Flat affect   PT - Cognitive impairments: Sequencing, Problem solving, Initiation, Memory                       PT - Cognition Comments: Pt recalls 2/3 hip precautions when prompted. Following commands:  Intact      Cueing Cueing Techniques: Verbal cues, Visual cues, Tactile cues  Exercises Other Exercises Other Exercises: seated BLE AROM: LAQ x5 reps ea x3 sets Other Exercises: STS x 1 trial for BLE strengthening ~60 sec with +2 assist and Stedy lift Other Exercises: seated lateral leans x3 reps ea side with UE support seated anterior reaching x3 reps with ea UE Other Exercises: supine PROM heel cord stretch x30 sec x1 rep ea (discussed with RN/MD recommend Prevalon boots for him while in supine) Other Exercises: supine hip ab/aDduction (AA for safety given precs) x5 reps ea    General Comments General comments (skin integrity, edema, etc.): see BP in comments above; hypertensive but HR/SpO2 [facility]; Of note, per spouse she hasn't been encouraging him to eat/drink much due to "wanting to flush the other meds and stuff out of his system" after surgery and enema. Discussed benefits of keeping HOB >30 degrees when he is ready to eat or drink to reduce risk of aspiration/PNA.       Pertinent Vitals/Pain Pain Assessment Pain Assessment: Faces Faces Pain Scale: Hurts  little more Pain Location: R hip Pain Descriptors / Indicators: Sore, Numbness, Operative site guarding Pain Intervention(s): Monitored during session, Premedicated before session, Repositioned, Other (comment) (pt reports some post-op numbness; initially prior to bed mobility pt states "it's above a 10" when speaking with spouse, then during session pt states "moderate" when given options of mild/moderate/severe and not grimacing or guarding much with activity)    Home Living                          Prior Function            PT Goals (current goals can now be found in the care plan section) Acute Rehab PT Goals Patient Stated Goal: home PT Goal Formulation: With patient/family Time For Goal Achievement: 12/30/23 Progress towards PT goals: Progressing toward goals    Frequency    Min 2X/week      PT Plan       Co-evaluation              AM-PAC PT "6 Clicks" Mobility   Outcome Measure  Help needed turning from your back to your side while in a flat bed without using bedrails?: Total (+2 currently due to hip precs) Help needed moving from lying on your back to sitting on the side of a flat bed without using bedrails?: A Lot (in hospital bed which is his baseline at home) Help needed moving to and from a bed to a chair (including a wheelchair)?: Total Help needed standing up from a chair using your arms (e.g., wheelchair or bedside chair)?: Total Help needed to walk in hospital room?: Total Help needed climbing 3-5 steps with a railing? : Total 6 Click Score: 7    End of Session Equipment Utilized During Treatment: Gait belt;Other (comment) (spouse refusing ABD pillow in supine due to pt's recent loose stools after overnight enema, pillows placed to float his heels instead and slightly more to RLE medial limb to encourage R hip ER given posterior hip precs) Activity Tolerance: Patient tolerated treatment well;Treatment limited secondary to medical complications (Comment);Other (comment) (pt with elevated SBP/DBP, RN/MD notified) Patient left: in bed;with call bell/phone within reach;with bed alarm set;Other (comment);with family/visitor present (pt heels floated, spouse defers hip abd pillow to be placed, used regular pillows to promote hip ER on RLE and to float his heels) Nurse Communication: Mobility status;Need for lift equipment;Other (comment) (BP elevated, SCD not placed yet due to elevated BP defer to RN discretion, pt would benefit from Prevalon boots.) PT Visit Diagnosis: Other abnormalities of gait and mobility (R26.89);Muscle weakness (generalized) (M62.81);Pain Pain - Right/Left: Right Pain - part of body: Hip     Time: 9147-8295 PT Time Calculation (min) (ACUTE ONLY): 60 min  Charges:    $Therapeutic Exercise: 8-22 mins $Therapeutic Activity: 38-52 mins PT General  Charges $$ ACUTE PT VISIT: 1 Visit                     Sondos Wolfman P., PTA Acute Rehabilitation Services Secure Chat Preferred 9a-5:30pm Office: [phone]    Mariel Shope [facility] 12/17/2023, 11:48 AM

## 2023-12-17 NOTE — NC FL2 (Addendum)
 [place]  MEDICAID FL2 LEVEL OF CARE FORM     IDENTIFICATION  Patient Name: Luis Ballen Wachter Jr. Birthdate: [birth year] Sex: male Admission Date (Current Location): 12/13/2023  [facility] and [place] Number:  Producer, television/film/video and Address:  [facility], [address] [place], [place] [address]      Provider Number: [id number]  Attending Physician Name and Address:  Jodeane Mulligan, DO  Relative Name and Phone Number:  Delacruz,Sandra (Spouse)  [phone]    Current Level of Care: Hospital Recommended Level of Care: Skilled Nursing Facility Prior Approval Number:    Date Approved/Denied:   PASRR Number: [id number]  Discharge Plan: SNF    Current Diagnoses: Patient Active Problem List   Diagnosis Date Noted   Closed displaced fracture of right femoral neck (HCC) 12/13/2023   Greater trochanteric bursitis, right 07/14/2023   Pain in joint of right shoulder 06/02/2023   Myofascial pain dysfunction syndrome 02/28/2023   Spinal stenosis of lumbar region with neurogenic claudication 09/18/2022   Low serum albumin     Lumbar compression fracture (HCC) 06/04/2022   Compression fracture of lumbar vertebra (HCC) 05/27/2022   Other chronic pain 10/18/2021   Chronic constipation 10/18/2021   Gastro-esophageal reflux disease without esophagitis 01/09/2021   Hyperlipidemia 01/09/2021   Obstructive sleep apnea syndrome 01/09/2021   Gait abnormality 10/19/2018   Bilateral foot-drop 10/19/2018   Parkinson's disease (HCC) 10/19/2018   Swelling of lower leg - Right  03/06/2018   Tremor of right hand 12/19/2017   FSH (facioscapulohumeral muscular dystrophy) (HCC) 10/07/2017   Dilated cardiomyopathy (HCC) 05/02/2017   Upper airway cough syndrome 03/18/2014   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  03/17/2014   Abnormal chest xray 03/16/2014    Orientation RESPIRATION BLADDER Height & Weight     Self, Place, Situation  Normal Incontinent, External catheter Weight: 176 lb  5.9 oz (80 kg) Height:  5' 10.98" (180.3 cm)  BEHAVIORAL SYMPTOMS/MOOD NEUROLOGICAL BOWEL NUTRITION STATUS      Continent Diet (see DC summary)  AMBULATORY STATUS COMMUNICATION OF NEEDS Skin   Extensive Assist Verbally Surgical wounds, Other (Comment) (05/31/22 Back Left;Right. Leg, right)                       Personal Care Assistance Level of Assistance  Bathing, Feeding, Dressing Bathing Assistance: Maximum assistance Feeding assistance: Limited assistance Dressing Assistance: Maximum assistance     Functional Limitations Info  Sight, Hearing, Speech Sight Info: Adequate Hearing Info: Adequate Speech Info: Impaired (slightly slurred)    SPECIAL CARE FACTORS FREQUENCY  PT (By licensed PT), OT (By licensed OT)     PT Frequency: 5x/week OT Frequency: 5x/week            Contractures Contractures Info: Present    Additional Factors Info  Code Status, Allergies Code Status Info: DNR Allergies Info: Celebrex (Celecoxib)  Cymbalta  (Duloxetine  Hcl)           Current Medications (12/17/2023):  This is the current hospital active medication list Current Facility-Administered Medications  Medication Dose Route Frequency Provider Last Rate Last Admin   aspirin  chewable tablet 81 mg  81 mg Oral BID Moore, Michael A, MD   81 mg at 12/16/23 2208   bisacodyl  (DULCOLAX) EC tablet 5 mg  5 mg Oral Daily PRN Rai, Ripudeep K, MD   5 mg at 12/15/23 1816   carbidopa -levodopa  (SINEMET  CR) 50-200 MG per tablet controlled release 1 tablet  1 tablet Oral TID Sulema Endo,  Asenath Blacker, MD   1 tablet at 12/16/23 2209   diazepam  (VALIUM ) tablet 10 mg  10 mg Oral BID PRN Moore, Michael A, MD       doxycycline  (VIBRA -TABS) tablet 100 mg  100 mg Oral BID WC Moore, Michael A, MD   100 mg at 12/16/23 1707   famotidine  (PEPCID ) tablet 20 mg  20 mg Oral Daily Rai, Ripudeep K, MD   20 mg at 12/16/23 0845   gabapentin  (NEURONTIN ) capsule 600 mg  600 mg Oral QID Moore, Michael A, MD   600 mg at 12/16/23 1332    HYDROmorphone  (DILAUDID ) tablet 10 mg  10 mg Oral Q6H Moore, Michael A, MD       nystatin  (MYCOSTATIN ) 100000 UNIT/ML suspension 500,000 Units  5 mL Oral TID Moore, Michael A, MD   500,000 Units at 12/16/23 2208   polyethylene glycol (MIRALAX  / GLYCOLAX ) packet 17 g  17 g Oral Daily PRN Moore, Michael A, MD       polyethylene glycol (MIRALAX  / GLYCOLAX ) packet 17 g  17 g Oral QHS Moore, Michael A, MD   17 g at 12/16/23 2209   prednisoLONE  (PRELONE ) 15 MG/5ML SOLN 22.5 mg  22.5 mg Oral Daily Moore, Michael A, MD   22.5 mg at 12/16/23 1121   prednisoLONE  acetate (PRED FORTE ) 1 % ophthalmic suspension 1 drop  1 drop Both Eyes Daily Diedra Fowler, MD   1 drop at 12/16/23 3086   senna-docusate (Senokot-S) tablet 1 tablet  1 tablet Oral BID Rai, Ripudeep K, MD   1 tablet at 12/16/23 0845   timolol  (BETIMOL ) 0.5 % ophthalmic solution 1 drop  1 drop Both Eyes BID Diedra Fowler, MD   1 drop at 12/16/23 2212   tranexamic acid  (CYKLOKAPRON ) IVPB 1,000 mg  1,000 mg Intravenous Once Diedra Fowler, MD       zolpidem  (AMBIEN ) tablet 10 mg  10 mg Oral QHS Rai, Ripudeep K, MD   10 mg at 12/16/23 2209     Discharge Medications: Please see discharge summary for a list of discharge medications.  Relevant Imaging Results:  Relevant Lab Results:   Additional Information [id number]  Keiandre Cygan A [place], LCSW

## 2023-12-18 DIAGNOSIS — G20A1 Parkinson's disease without dyskinesia, without mention of fluctuations: Secondary | ICD-10-CM | POA: Diagnosis not present

## 2023-12-18 DIAGNOSIS — S72001A Fracture of unspecified part of neck of right femur, initial encounter for closed fracture: Secondary | ICD-10-CM | POA: Diagnosis not present

## 2023-12-18 DIAGNOSIS — G7102 Facioscapulohumeral muscular dystrophy: Secondary | ICD-10-CM | POA: Diagnosis not present

## 2023-12-18 NOTE — TOC Progression Note (Signed)
 Transition of Care (TOC) - Progression Note    Patient Details  Name: Luis [name]. MRN: [id number] Date of Birth: 1955/12/21  [facility] of [facility]) CM/SW Contact  Lanyiah Brix A [place], LCSW Phone Number: 12/18/2023, 4:49 PM  Clinical Narrative:     CSW uploaded PASSR documentation to  MUST, waiting on PASSR  # still pending at this time.    TOC will continue to follow.   Expected Discharge Plan: Skilled Nursing Facility Barriers to Discharge: SNF Pending bed offer, Continued Medical Work up  Expected Discharge Plan and Services       Living arrangements for the past 2 months: Single Family Home                                       Social Determinants of Health (SDOH) Interventions SDOH Screenings   Food Insecurity: No Food Insecurity (12/13/2023)  Housing: Low Risk  (12/13/2023)  Transportation Needs: No Transportation Needs (12/13/2023)  Utilities: Not At Risk (12/13/2023)  Depression (PHQ2-9): Low Risk  (07/14/2023)  Social Connections: Socially Integrated (12/13/2023)  Tobacco Use: Low Risk  (12/14/2023)    Readmission Risk Interventions     No data to display

## 2023-12-18 NOTE — Plan of Care (Signed)

## 2023-12-18 NOTE — Plan of Care (Signed)
  Problem: Education: Goal: Knowledge of General Education information will improve Description: Including pain rating scale, medication(s)/side effects and non-pharmacologic comfort measures 12/18/2023 1949 by Lennard Quirk, LPN Outcome: Progressing 12/18/2023 1949 by Lennard Quirk, LPN Outcome: Progressing   Problem: Health Behavior/Discharge Planning: Goal: Ability to manage health-related needs will improve 12/18/2023 1949 by Lennard Quirk, LPN Outcome: Progressing 12/18/2023 1949 by Lennard Quirk, LPN Outcome: Progressing   Problem: Clinical Measurements: Goal: Ability to maintain clinical measurements within normal limits will improve 12/18/2023 1949 by Lennard Quirk, LPN Outcome: Progressing 12/18/2023 1949 by Lennard Quirk, LPN Outcome: Progressing Goal: Will remain free from infection 12/18/2023 1949 by Lennard Quirk, LPN Outcome: Progressing 12/18/2023 1949 by Lennard Quirk, LPN Outcome: Progressing Goal: Diagnostic test results will improve 12/18/2023 1949 by Lennard Quirk, LPN Outcome: Progressing 12/18/2023 1949 by Lennard Quirk, LPN Outcome: Progressing Goal: Respiratory complications will improve 12/18/2023 1949 by Lennard Quirk, LPN Outcome: Progressing 12/18/2023 1949 by Lennard Quirk, LPN Outcome: Progressing Goal: Cardiovascular complication will be avoided 12/18/2023 1949 by Lennard Quirk, LPN Outcome: Progressing 12/18/2023 1949 by Lennard Quirk, LPN Outcome: Progressing   Problem: Activity: Goal: Risk for activity intolerance will decrease 12/18/2023 1949 by Lennard Quirk, LPN Outcome: Progressing 12/18/2023 1949 by Lennard Quirk, LPN Outcome: Progressing   Problem: Nutrition: Goal: Adequate nutrition will be maintained 12/18/2023 1949 by Lennard Quirk, LPN Outcome: Progressing 12/18/2023 1949 by Lennard Quirk, LPN Outcome: Progressing   Problem: Coping: Goal: Level  of anxiety will decrease 12/18/2023 1949 by Lennard Quirk, LPN Outcome: Progressing 12/18/2023 1949 by Lennard Quirk, LPN Outcome: Progressing   Problem: Elimination: Goal: Will not experience complications related to bowel motility 12/18/2023 1949 by Lennard Quirk, LPN Outcome: Progressing 12/18/2023 1949 by Lennard Quirk, LPN Outcome: Progressing Goal: Will not experience complications related to urinary retention 12/18/2023 1949 by Lennard Quirk, LPN Outcome: Progressing 12/18/2023 1949 by Lennard Quirk, LPN Outcome: Progressing   Problem: Pain Managment: Goal: General experience of comfort will improve and/or be controlled 12/18/2023 1949 by Lennard Quirk, LPN Outcome: Progressing 12/18/2023 1949 by Lennard Quirk, LPN Outcome: Progressing   Problem: Safety: Goal: Ability to remain free from injury will improve 12/18/2023 1949 by Lennard Quirk, LPN Outcome: Progressing 12/18/2023 1949 by Lennard Quirk, LPN Outcome: Progressing   Problem: Skin Integrity: Goal: Risk for impaired skin integrity will decrease 12/18/2023 1949 by Lennard Quirk, LPN Outcome: Progressing 12/18/2023 1949 by Lennard Quirk, LPN Outcome: Progressing

## 2023-12-18 NOTE — TOC Progression Note (Addendum)
 Transition of Care (TOC) - Progression Note    Patient Details  Name: Luis [name]. MRN: [id number] Date of Birth: [birth year]  Transition of Care [facility]) CM/SW Contact  Amarylis Rovito A [place], LCSW Phone Number: 12/18/2023, 12:38 PM  Clinical Narrative:     Update 1425 CSW spoke with pt's wife at bedside to discuss plan for SNF. Stated she was going to visit facility and speak with admission at [facility]. Date of discharge to be determined. Provider notified.    CSW met with pt and pt's wife at bedside. CSW informed her that she had a bed at [facility] if pt ended up going that route for DC.   She stated that she was waiting for final word from CIR about referral and would let CSW know their decision afterward.   CSW notified [facility] of pending decision and they stated bed would hold bed for pt in the meantime.    TOC will continue to follow.   Expected Discharge Plan: Skilled Nursing Facility Barriers to Discharge: SNF Pending bed offer, Continued Medical Work up  Expected Discharge Plan and Services       Living arrangements for the past 2 months: Single Family Home                                       Social Determinants of Health (SDOH) Interventions SDOH Screenings   Food Insecurity: No Food Insecurity (12/13/2023)  Housing: Low Risk  (12/13/2023)  Transportation Needs: No Transportation Needs (12/13/2023)  Utilities: Not At Risk (12/13/2023)  Depression (PHQ2-9): Low Risk  (07/14/2023)  Social Connections: Socially Integrated (12/13/2023)  Tobacco Use: Low Risk  (12/14/2023)    Readmission Risk Interventions     No data to display

## 2023-12-18 NOTE — Progress Notes (Signed)
 Orthopedic Surgery Progress Note   Assessment: Patient is a 68 y.o. male with right femoral neck fracture s/p cemented hemiarthroplasty   Plan: -Operative plans: complete -Diet: regular -DVT ppx: aspirin 81mg  BID -Antibiotics: ancef x2 post-op doses -Weight bearing status: as tolerated with posterior hip precautions -PT evaluate and treat -Pain control -Dispo: per primary  ___________________________________________________________________________  Subjective: No acute events overnight. Still having pain in his hip but it is improving. Denies paresthesias and numbness.    Physical Exam:  General: no acute distress, appears stated age, laying in bed  Neurologic: alert, answering questions appropriately, following commands Respiratory: unlabored breathing on room air, symmetric chest rise Psychiatric: appropriate affect, normal cadence to speech  MSK:   -Right lower extremity  Dressing over hip c/d/i EHL and TA not intact due to muscular dystrophy (chronic), GSC intact Plantarflexes toes Sensation intact to light touch in sural, saphenous, tibial, deep peroneal, and superficial peroneal nerve distributions Foot warm and well perfused, palpable DP pulse   Patient name: Luis Smolen Mottley Jr. Patient MRN: [id number] Date: 12/18/23

## 2023-12-18 NOTE — Consult Note (Addendum)
 Physical Medicine and Rehabilitation Consult Reason for Consult: Impaired functional mobility after right femoral neck fracture and hip hemiarthroplasty Referring Physician: Sharlene Dory   HPI: Luis [name]. is a 68 y.o. [name] with history of FSH muscular dystrophy, Parkinson's disease and spinal stenosis who fell at home on 12/13/2023.  CT of his head was negative.  CT of the right hip revealed a a comminuted and displaced right femoral neck fracture.  On 12/14/2023 Dr. Christell Constant performed a right hip hemiarthroplasty via posterior approach.  Patient was placed on aspirin 81 mg twice daily postoperatively.  He has developed a a leukocytosis which is felt to be possibly reactive.  Blood pressure has been elevated.  With therapy yesterday he was max assist +2 for sit to stand transfers and was total assist for lateral seated scoot transfer towards the head of the bed using a Stedy.  Patient lives with his spouse in a 1 level house with a stair lift to enter.  Uses a walker apparently at a high setting and he rests his forearms and bends over at an angle to help support his weight.   Review of Systems  Constitutional:  Negative for fever.  HENT: Negative.    Eyes: Negative.   Respiratory: Negative.    Cardiovascular: Negative.   Gastrointestinal: Negative.   Musculoskeletal:  Positive for back pain, joint pain and myalgias.  Skin: Negative.   Neurological:  Positive for sensory change, focal weakness and weakness.  Psychiatric/Behavioral: Negative.     Past Medical History:  Diagnosis Date   Bilateral foot-drop 10/19/2018   Bursitis, trochanteric    Episodic   Carpal tunnel syndrome on right    Degenerative disk disease    l5-S1   FSH (facioscapulohumeral muscular dystrophy) (HCC) 10/07/2017   Gait abnormality 10/19/2018   GERD (gastroesophageal reflux disease)    Hemorrhoids    with anal fissures   Left lateral epicondylitis    Parkinson's disease (HCC) 10/19/2018   Skin cancer     right temple area   Past Surgical History:  Procedure Laterality Date   CHOLECYSTECTOMY  2007   HEMORRHOIDECTOMY WITH HEMORRHOID BANDING     IR KYPHO LUMBAR INC FX REDUCE BONE BX UNI/BIL CANNULATION INC/IMAGING  01/10/2020   IR KYPHO LUMBAR INC FX REDUCE BONE BX UNI/BIL CANNULATION INC/IMAGING  05/31/2022   TOTAL HIP ARTHROPLASTY Right 12/14/2023   Procedure: ARTHROPLASTY, HIP, HEMI POSTERIOR APPROACH;  Surgeon: London Sheer, MD;  Location: [facility] OR;  Service: Orthopedics;  Laterality: Right;   Family History  Problem Relation Age of Onset   Allergies Brother    Allergies Sister    Heart disease Father    Brain cancer Mother    Bone cancer Brother    Social History:  reports that he has never smoked. He has never used smokeless tobacco. He reports that he does not drink alcohol and does not use drugs. Allergies:  Allergies  Allergen Reactions   Celebrex [Celecoxib] Other (See Comments)    Burning sensation   Cymbalta [Duloxetine Hcl] Other (See Comments)    No relief   Medications Prior to Admission  Medication Sig Dispense Refill   carbidopa-levodopa (SINEMET CR) 50-200 MG tablet Take 1 tablet by mouth in the morning, at noon, and at bedtime. 270 tablet 4   diazepam (VALIUM) 5 MG tablet Take 2 tablets (10 mg total) by mouth 2 (two) times daily as needed for anxiety. (Patient taking differently: Take 10 mg by mouth 2 (two)  times daily as needed for muscle spasms (leg cramps).) 20 tablet 0   diclofenac Sodium (VOLTAREN) 1 % GEL Apply 2 g topically 4 (four) times daily. (Patient taking differently: Apply 2 g topically 4 (four) times daily as needed (pain).) 2 g 0   doxycycline (ADOXA) 100 MG tablet Take 100 mg by mouth 2 (two) times daily with a meal.     gabapentin (NEURONTIN) 600 MG tablet Take 1 tablet (600 mg total) by mouth 4 (four) times daily. For nerve pain (Patient taking differently: Take 300-600 mg by mouth 4 (four) times daily. For nerve pain) 360 tablet 4   HYDROmorphone  (DILAUDID) 2 MG tablet Take 2 mg by mouth 4 (four) times daily as needed for severe pain (pain score 7-10).     HYDROmorphone (DILAUDID) 8 MG tablet Take 8 mg by mouth 4 (four) times daily as needed for severe pain (pain score 7-10).     ibuprofen (ADVIL) 800 MG tablet Take 1 tablet (800 mg total) by mouth every 8 (eight) hours as needed. 120 tablet 5   nystatin (MYCOSTATIN) 100000 UNIT/ML suspension Take 5 mLs by mouth 3 (three) times daily.     polyethylene glycol (MIRALAX / GLYCOLAX) 17 g packet Take 17 g by mouth at bedtime.     prednisoLONE (PRELONE) 15 MG/5ML SOLN Take 22.5 mg by mouth daily.     prednisoLONE acetate (PRED FORTE) 1 % ophthalmic suspension Place 1 drop into both eyes daily.  0   silver sulfADIAZINE (SILVADENE) 1 % cream Apply 1 Application topically 2 (two) times daily.     timolol (BETIMOL) 0.5 % ophthalmic solution Place 1 drop into both eyes 2 (two) times daily.     zolpidem (AMBIEN) 10 MG tablet Take 10 mg by mouth at bedtime.      Home: Home Living Family/patient expects to be discharged to:: Private residence Living Arrangements: Spouse/significant other Available Help at Discharge: Family, Available 24 hours/day Type of Home: House Home Access: Stairs to enter [employer] of Steps: stair lift Home Layout: One level Bathroom Shower/Tub: Sponge bathes at baseline Bathroom Toilet: Handicapped height Home Equipment: Agricultural consultant (2 wheels), Toilet riser, Shower seat, Grab bars - tub/shower, Grab bars - toilet, Wheelchair - manual, Lift chair, Hospital bed Additional Comments: Pt lives with wife who assists with all needs.  Functional History: Prior Function Prior Level of Function : Needs assist Physical Assist : Mobility (physical), ADLs (physical) Mobility (physical): Bed mobility, Gait, Transfers ADLs (physical): Bathing, Grooming, Dressing, IADLs Mobility Comments: walks with RW in highest setting, resting on forearms with trunk at near 90  degrees. Pt has w/c but it doesn't fit in the house. ADLs Comments: Wife assists with all ADLs Functional Status:  Mobility: Bed Mobility Overal bed mobility: Needs Assistance Bed Mobility: Supine to Sit Rolling: Max assist, +2 for physical assistance, +2 for safety/equipment Supine to sit: Max assist, HOB elevated, Used rails, +2 for physical assistance Sit to supine: Total assist, +2 for physical assistance General bed mobility comments: to L EOB with use of hospital bed features, pt able to assist with moving his legs toward EOB but needs heavy assist to elevate trunk from bed along with use of rails, and hand over hand assist for cross-body reaching with RUE due to weakness, pt making good effort to assist as able. +2 maxA to totalA to advance his hips forward via lateral leaning/contralateral hip scooting with bed pad assist as pt not able to flex forward due to hip precs.  Transfers Overall transfer level: Needs assistance Equipment used: Ambulation equipment used Transfers: Sit to/from Stand, Bed to chair/wheelchair/BSC Sit to Stand: Max assist, +2 physical assistance, From elevated surface, Via lift equipment  Lateral/Scoot Transfers: Total assist, +2 physical assistance, From elevated surface Transfer via Lift Equipment: Stedy General transfer comment: elevated bed>Stedy x1 trial, pt maintains significant trunk flexion, slightly improves with cues to "look out the window", good effort to extend his knees. Will not be safe to transfer OOB via Stedy as he can't extend his hips/trunk enough to place flaps behind him. Defer transfer OOB to chair via hoyer pad due to elevated BP after standing, RN/MD notified. Pt needing totalA +2 for single attempt at lateral seated scoot toward [facility] but pt not able to assist with UE/LE due to weakness/dystrophy and posterior hip precs so bed pad utilized.      ADL: ADL Overall ADL's : Needs assistance/impaired Eating/Feeding: Set up, Bed level Grooming:  Moderate assistance, Bed level Upper Body Bathing: Maximal assistance, Bed level Lower Body Bathing: Total assistance, Bed level Upper Body Dressing : Maximal assistance, Bed level Lower Body Dressing: Total assistance, Bed level Toileting- Clothing Manipulation and Hygiene: Total assistance, Bed level General ADL Comments: max to total at bed level at this time, little to no AROM to RUE/RLE, max A x2 to assist to EOB.  Cognition: Cognition Orientation Level: Oriented X4 Cognition Arousal: Alert Behavior During Therapy: WFL for tasks assessed/performed, Flat affect  Blood pressure 107/66, pulse 61, temperature 98.2 F (36.8 C), resp. rate 20, height 5' 10.98" (1.803 m), weight 80 kg, SpO2 99%. Physical Exam Constitutional:      General: He is not in acute distress. HENT:     Head: Normocephalic.     Right Ear: External ear normal.     Left Ear: External ear normal.     Mouth/Throat:     Mouth: Mucous membranes are moist.  Eyes:     Pupils: Pupils are equal, round, and reactive to light.  Cardiovascular:     Rate and Rhythm: Normal rate.  Pulmonary:     Effort: Pulmonary effort is normal.  Abdominal:     Palpations: Abdomen is soft.  Musculoskeletal:        General: Tenderness (right hip) present.     Cervical back: Neck supple.     Comments: Both heel cords tight.   Skin:    Comments: Right hip dressed  Neurological:     Mental Status: He is alert.     Comments: Pt is alert. Speech is sl dysarthric. Masked facies.  Demonstrates reasonable insight and awareness. CN exam non-focal. MMT: BUE trace deltoids, 2/5 biceps and triceps, 4+/5 grip. RLE limited by pain proximally, ADF absent, APF 2/5. LLE 2-/5 HF, KE, 0/5 ADF, 3/5 APF. Decreased sensation in RUE, temporary d/t compression of arm in bed? DTR's 1+. No abnl resting tone. Does demonstrate generalized rigidity   Psychiatric:        Mood and Affect: Mood normal.     No results found for this or any previous visit (from  the past 24 hours). No results found.  Assessment/Plan: Diagnosis: 68 year old [name] with parkinson's disease and FSH muscular dystrophy status post right femoral neck fracture and subsequent right hip hemi. Does the need for close, 24 hr/day medical supervision in concert with the patient's rehab needs make it unreasonable for this patient to be served in a less intensive setting? Yes, poentially Co-Morbidities requiring supervision/potential complications:  - Mild leukocytosis -Postoperative pain and  wound care Due to bladder management, bowel management, safety, skin/wound care, disease management, medication administration, pain management, and patient education, does the patient require 24 hr/day rehab nursing? Potentially Does the patient require coordinated care of a physician, rehab nurse, therapy disciplines of PT, OT to address physical and functional deficits in the context of the above medical diagnosis(es)? Potentially Addressing deficits in the following areas: balance, endurance, locomotion, strength, transferring, bowel/bladder control, bathing, dressing, feeding, grooming, toileting, and psychosocial support Can the patient actively participate in an intensive therapy program of at least 3 hrs of therapy per day at least 5 days per week?  Likely not The potential for patient to make measurable gains while on inpatient rehab is fair and poor Anticipated functional outcomes upon discharge from inpatient rehab are n/a  with PT, n/a with OT, n/a with SLP. Estimated rehab length of stay to reach the above functional goals is: see below Anticipated discharge destination: Other Overall Rehab/Functional Prognosis: fair  POST ACUTE RECOMMENDATIONS: This patient's condition is appropriate for continued rehabilitative care in the following setting: SNF Patient has agreed to participate in recommended program. Yes and Potentially Note that insurance prior authorization may be required for  reimbursement for recommended care.  Comment: Spoke with pt and wife at length. I remember them from his prior stay with us  on inpatient rehab. He worked hard! Dr. Lovorn follows him in the office as well. He was struggling with basic transfers prior to this injury. Getting from their car to their deck where stair lift is located was difficult previously as well. He is not going to improve enough in a 2-3 week inpatient rehab stay where he could perform those basic transfers in a way that his wife could manage. We can follow for functional progress, but SNF is the most realistic option for Luis [name]. Rehab Admissions Coordinator to follow up.         I have personally performed a face to face diagnostic evaluation of this patient. Additionally, I have examined the patient's medical record including any pertinent labs and radiographic images.    Thanks,  Rawland Caddy, MD 12/18/2023

## 2023-12-18 NOTE — Progress Notes (Signed)
  Progress Note   Patient: Luis [name] [employer] ZOX:[id number] DOB: 15-Feb-1956 DOA: 12/13/2023     5 DOS: the patient was seen and examined on 12/18/2023   Brief hospital course: Patient is a 68 year old male with GERD, HLD, FSH muscular dystrophy, Parkinson's disease, spinal stenosis, chronic pain, OSA presented after falls at home and hip pain.  Patient reported first fall the night before the admission while walking to the bathroom with his walker, had significant pain in the right hip.  On the day of admission he fell again when he was unable to bear weight.  Family reported he did hit his head during the fall.  CT right hip showed slightly comminuted displaced fracture of the right femoral neck  Assessment and Plan:  Closed displaced fracture of the right femoral neck - Status post hemiarthroplasty 4/13.  Orthopedic following closely.  Aspirin 81 mg twice daily.  PT/OT recommending SNF.  Continue home oral Dilaudid.  FSH (facioscapulohumeral muscular dystrophy)/spinal stenosis/Chronic severe neuropathic pain/chronic low back pain - Followed closely by neurology in the outpatient setting.  Home Dilaudid, gabapentin.  PT/OT on board.  Parkinson's disease - Sinemet on board.  Constipation - Senokot, MiraLAX, mag citrate x 1.  Appears to be resolved.  Goals of care - Working closely with TOC, orthopedic surgery, patient and wife on disposition planning.  Initial consult to CIR as patient had been seen there before.  However given patient's decline and underlying comorbidities (FSH/Parkinson's), CAR inpatient rehab not feasible (see consult note).  At this time we will work with case management to pursue SNF at [facility].     Subjective: Patient resting comfortably in bed.  Pain with movement.  Denies any fever, chills, shortness of breath, chest pain, nausea, vomiting, abdominal pain.  Wife at bedside, very pleasant, updated on patient's status and goals of disposition.  Physical Exam: Vitals:    12/17/23 1943 12/18/23 0438 12/18/23 0924 12/18/23 1001  BP: 115/77 94/76 104/71 107/66  Pulse: (!) 58 68 62 61  Resp: 18 18 17 20   Temp: 98.3 F (36.8 C) 99 F (37.2 C) 98 F (36.7 C) 98.2 F (36.8 C)  TempSrc:   Oral   SpO2: 99% 96% 100% 99%  Weight:      Height:       GENERAL:  Alert, pleasant, no acute distress  HEENT:  EOMI CARDIOVASCULAR:  RRR, no murmurs appreciated RESPIRATORY:  Clear to auscultation, no wheezing, rales, or rhonchi GASTROINTESTINAL:  Soft, nontender, nondistended EXTREMITIES: Mild edema BL LE NEURO:  No new focal deficits appreciated SKIN:  No rashes noted PSYCH:  Appropriate mood and affect   Data Reviewed:  There are no new results to review at this time.  Family Communication: Wife at bedside  Disposition: Status is: Inpatient Remains inpatient appropriate because: Right hip fracture  Planned Discharge Destination: Skilled nursing facility    Time spent: 35 minutes  Author: Jodeane Mulligan, DO 12/18/2023 12:37 PM  For on call review [email or web address].

## 2023-12-18 NOTE — Progress Notes (Signed)
 Name: Luis [name]  DOB: Apr 23, 1956  Please be advised that the above-named patient will require a short-term nursing home stay -- anticipated 30 days or less for rehabilitation and strengthening. The plan is for return home.

## 2023-12-18 NOTE — Progress Notes (Signed)
 Occupational Therapy Treatment Patient Details Name: Luis [name]. MRN: [id number] DOB: 1955-12-29 Today's Date: 12/18/2023   History of present illness Pt is a 68 y.o. male admitted 4/12 following a fall at home. Imaging revealed R hip fx. He underwent R hip hemiarthroplasty 4/13. PMH:  hyperlipidemia, GERD, FSH muscular dystrophy, Parkinson disease, spinal stenosis, chronic pain, OSA.   OT comments  Pt resting comfortably in bed, very motivated to participate and improve. Pt assisted to EOB with max A x2, once sitting EOB able to sit statically with fair balance. Pt attempted multiple times to perform sit to stand with Stedy, able to raise bottom off bed briefly but not able to maintain or stand fully upright. Pt tolerated well, minimal to no pain during activities. Pt assisted back to bed with total A Pt educated on bed level exercises.  Spoke with Pts wife using active listening, addressing concerns and sympathizing with situation. Pt and wife are very supportive of each other and Pt displays good tenacity to improve strength and participate in therapy. DC plan to postacute rehab <3hrs/day still appropriate, will continue to see acutely to progress as able.       If plan is discharge home, recommend the following:  Two people to help with walking and/or transfers;Two people to help with bathing/dressing/bathroom;Assistance with cooking/housework;Assist for transportation;Help with stairs or ramp for entrance   Equipment Recommendations  None recommended by OT    Recommendations for Other Services      Precautions / Restrictions Precautions Precautions: Fall;Posterior Hip Precaution Booklet Issued: Yes (comment) Recall of Precautions/Restrictions: Intact Precaution/Restrictions Comments: Pt recalls 2/3 PHP at beginning of session, handout printed to display in room for staff and pt awareness as well. Restrictions Weight Bearing Restrictions Per Provider Order: Yes RLE Weight Bearing  Per Provider Order: Weight bearing as tolerated Other Position/Activity Restrictions: abd pillow in bed       Mobility Bed Mobility Overal bed mobility: Needs Assistance Bed Mobility: Supine to Sit, Sit to Supine Rolling: Max assist, +2 for physical assistance, +2 for safety/equipment   Supine to sit: Max assist, HOB elevated, Used rails, +2 for physical assistance Sit to supine: Total assist, +2 for physical assistance   General bed mobility comments: max to total for in/out of bed,assist for BLEs and maintaining proper positioning of R hip    Transfers Overall transfer level: Needs assistance Equipment used: Ambulation equipment used Transfers: Sit to/from Stand Sit to Stand: Max assist, +2 physical assistance, From elevated surface, Via lift equipment           General transfer comment: max A x2 for STS, Pt not able to stand fully upright Transfer via Lift Equipment: Stedy   Balance Overall balance assessment: Needs assistance Sitting-balance support: Feet supported, Single extremity supported, No upper extremity supported Sitting balance-Leahy Scale: Fair Sitting balance - Comments: able to sit statically with feet supported at EOB   Standing balance support: Bilateral upper extremity supported, Reliant on assistive device for balance Standing balance-Leahy Scale: Zero Standing balance comment: attempted to stand with stedy, able to lift bottom off bed, unable to maintain more than 10 seconds                           ADL either performed or assessed with clinical judgement   ADL  Extremity/Trunk Assessment              Vision       Restaurant manager, fast food Communication: No apparent difficulties   Cognition Arousal: Alert Behavior During Therapy: WFL for tasks assessed/performed, Flat affect Cognition: No apparent impairments                                Following commands: Intact        Cueing      Exercises      Shoulder Instructions       General Comments      Pertinent Vitals/ Pain       Pain Assessment Pain Assessment: Faces Faces Pain Scale: Hurts little more Pain Location: R hip Pain Descriptors / Indicators: Sore, Numbness, Operative site guarding Pain Intervention(s): Limited activity within patient's tolerance  Home Living                                          Prior Functioning/Environment              Frequency  Min 2X/week        Progress Toward Goals  OT Goals(current goals can now be found in the care plan section)  Progress towards OT goals: Progressing toward goals  Acute Rehab OT Goals Patient Stated Goal: to improve strength OT Goal Formulation: With patient/family Time For Goal Achievement: 12/30/23 Potential to Achieve Goals: Good ADL Goals Pt Will Perform Upper Body Dressing: with min assist;sitting Pt Will Transfer to Toilet: with mod assist;bedside commode Pt/caregiver will Perform Home Exercise Program: Increased ROM;Increased strength;Right Upper extremity;With written HEP provided;With Supervision Additional ADL Goal #1: Pt will be able to perform supine to sit at mod A to prepare for sitting at EOB and transfers to maximize independence and minimize caregiver assistance.  Plan      Co-evaluation                 AM-PAC OT "6 Clicks" Daily Activity     Outcome Measure   Help from another person eating meals?: A Little Help from another person taking care of personal grooming?: A Lot Help from another person toileting, which includes using toliet, bedpan, or urinal?: Total Help from another person bathing (including washing, rinsing, drying)?: A Lot Help from another person to put on and taking off regular upper body clothing?: A Lot Help from another person to put on and taking off regular lower body clothing?: Total 6  Click Score: 11    End of Session Equipment Utilized During Treatment: Gait belt;Other (comment) (stedy)  OT Visit Diagnosis: Unsteadiness on feet (R26.81);Other abnormalities of gait and mobility (R26.89);Muscle weakness (generalized) (M62.81);History of falling (Z91.81);Pain Pain - Right/Left: Right Pain - part of body: Hip   Activity Tolerance Patient tolerated treatment well   Patient Left in bed;with call bell/phone within reach   Nurse Communication Mobility status        Time: 1420-1505 OT Time Calculation (min): 45 min  Charges: OT General Charges $OT Visit: 1 Visit OT Treatments $Self Care/Home Management : 8-22 mins $Therapeutic Activity: 23-37 mins  Jb Dulworth, OTR/L   Alexis Goodell 12/18/2023, 5:24 PM

## 2023-12-18 NOTE — Progress Notes (Signed)
 Inpatient Rehabilitation Admissions Coordinator   I met at bedside with patient, wife and friend with SW. Patient not a candidate for CIR  per Dr Delfin Fell assessment today. Felt SNF most realistic option at this time. A 2-3 week CIR stay will likely not be enough to get him to a level that his wife could mange him at home.  Jeannetta Millman, RN, MSN Rehab Admissions Coordinator [phone] 12/18/2023 1:45 PM

## 2023-12-19 DIAGNOSIS — S72001A Fracture of unspecified part of neck of right femur, initial encounter for closed fracture: Secondary | ICD-10-CM | POA: Diagnosis not present

## 2023-12-19 NOTE — Progress Notes (Signed)
 Orthopedic Surgery Progress Note   Assessment: Patient is a 68 y.o. male with right femoral neck fracture s/p cemented hemiarthroplasty   Plan: -Operative plans: complete -Diet: regular -DVT ppx: aspirin  81mg  BID -Weight bearing status: as tolerated with posterior hip precautions -PT evaluate and treat -Pain control -Dispo: per primary  ___________________________________________________________________________  Subjective: No acute events overnight. Pain getting better. Worked with PT yesterday. Did not have pain when doing sit to stand. Has been working on getting back to where he was prior to his fall.    Physical Exam:  General: no acute distress, appears stated age, laying in bed  Neurologic: alert, answering questions appropriately, following commands Respiratory: unlabored breathing on room air, symmetric chest rise Psychiatric: appropriate affect, normal cadence to speech  MSK:   -Right lower extremity  Dressing over hip c/d/i EHL and TA not intact due to muscular dystrophy (chronic), GSC intact Plantarflexes toes Sensation intact to light touch in sural, saphenous, tibial, deep peroneal, and superficial peroneal nerve distributions Foot warm and well perfused, palpable DP pulse   Patient name: Luis Releford Derise Jr. Patient MRN: [id number] Date: 12/19/23

## 2023-12-19 NOTE — Progress Notes (Signed)
  Progress Note   Patient: Luis [name] [employer]. FMW:[id number] DOB: [birth year] DOA: 12/13/2023     6 DOS: the patient was seen and examined on 12/19/2023   Brief hospital course: As per prior documentations: Patient is a 68 year old male with GERD, HLD, FSH muscular dystrophy, Parkinson's disease, spinal stenosis, chronic pain, OSA presented after falls at home and hip pain.  Patient reported first fall the night before the admission while walking to the bathroom with his walker, had significant pain in the right hip.  On the day of admission he fell again when he was unable to bear weight.  Family reported he did hit his head during the fall.  CT right hip showed slightly comminuted displaced fracture of the right femoral neck  12/19/2023: Patient seen alongside patient's nurse.  No complaints.  Awaiting disposition.  Possible discharge on Monday.  Assessment and Plan:  Closed displaced fracture of the right femoral neck - Status post hemiarthroplasty 4/13.  Orthopedic following closely.  Aspirin  81 mg twice daily.  PT/OT recommending SNF.  Continue home oral Dilaudid . 12/19/2023: Pursue disposition.  FSH (facioscapulohumeral muscular dystrophy)/spinal stenosis/Chronic severe neuropathic pain/chronic low back pain - Followed closely by neurology in the outpatient setting.  Home Dilaudid , gabapentin .  PT/OT   Parkinson's disease - Continue Sinemet .    Constipation - Senokot, MiraLAX , mag citrate x 1.  Appears to be resolved.  Goals of care - Working closely with TOC, orthopedic surgery, patient and wife on disposition planning.  Initial consult to CIR as patient had been seen there before.  However given patient's decline and underlying comorbidities (FSH/Parkinson's), CAR inpatient rehab not feasible (see consult note).  At this time we will work with case management to pursue SNF at [facility].     Subjective: No new complaints.  Physical Exam: Vitals:   12/18/23 2120 12/19/23 0453 12/19/23  0909 12/19/23 1535  BP: 113/87 (!) 107/58 95/73 100/73  Pulse: 67 (!) 55 60 64  Resp: 18 18 18    Temp: 98.2 F (36.8 C) 97.9 F (36.6 C) 97.8 F (36.6 C)   TempSrc: Oral Oral Oral   SpO2: 98% 98% 100%   Weight:      Height:       GENERAL: Awake and alert.  Not in any distress.    HEENT: Patient is pale. CARDIOVASCULAR: S1-S2.   RESPIRATORY:  Clear to auscultation. GASTROINTESTINAL:  Soft, nontender EXTREMITIES: Mild edema.   NEURO: Awake and alert.  Data Reviewed:  There are no new results to review at this time.  Family Communication: Wife at bedside  Disposition: Status is: Inpatient Remains inpatient appropriate because: Right hip fracture  Planned Discharge Destination: Skilled nursing facility    Time spent: 35 minutes  Author: Leatrice LILLETTE Chapel, MD 12/19/2023 5:55 PM  For on call review [email or web address].

## 2023-12-19 NOTE — Progress Notes (Signed)
 Physical Therapy Treatment Patient Details Name: Luis [name]. MRN: [id number] DOB: 29-Jun-1956 Today's Date: 12/19/2023   History of Present Illness Pt is a 68 y.o. male admitted 4/12 following a fall at home. Imaging revealed R hip fx. He underwent R hip hemiarthroplasty 4/13. PMH:  hyperlipidemia, GERD, FSH muscular dystrophy, Parkinson disease, spinal stenosis, chronic pain, OSA.    PT Comments  Pt received in supine, alert and eager to progress activity and mobility level, pt with decreased insight into deficits but making good progress toward goals this date. Pt able to perform sit<>stand trials from very elevated bed surface<>RW x5 reps but not able to achieve upright posture despite modification of body mechanics and AD placement. Pt BUE and trunk quick to fatigue and pt maintains very flexed trunk posture on each attempt. Plan to bring bariatric width RW with bilateral platforms next session vs slide board to try to progress to chair transfers next session, vs hoyer OOB to chair. Pt encouraged to ask nursing staff to hoyer him OOB to chair over weekend as well. Pt BP more stable this session, but severe c/o pain remains a limitation, pt tearful during session despite premedication with PO pain meds.    If plan is discharge home, recommend the following: Two people to help with walking and/or transfers;Two people to help with bathing/dressing/bathroom;Other (comment) (spouse assist with iADL/ADL at baseline)   Can travel by private vehicle     No  Equipment Recommendations  Other (comment) (TBD, at current level pt would need hoyer lift for home)    Recommendations for Other Services       Precautions / Restrictions Precautions Precautions: Fall;Posterior Hip Precaution Booklet Issued: Yes (comment) Recall of Precautions/Restrictions: Intact Precaution/Restrictions Comments: PHP handout taped on pt's board in room Restrictions Weight Bearing Restrictions Per Provider Order:  Yes RLE Weight Bearing Per Provider Order: Weight bearing as tolerated Other Position/Activity Restrictions: abd pillow in bed (defer this date due to pt's Prevalon boots donned at end of session)     Mobility  Bed Mobility Overal bed mobility: Needs Assistance Bed Mobility: Supine to Sit, Sit to Supine Rolling: Used rails, Mod assist   Supine to sit: Max assist, HOB elevated, Used rails Sit to supine: +2 for physical assistance, Max assist, Used rails   General bed mobility comments: to L EOB with improved initiation and technique this date, pt using hospital bed features. Rolling to L/R sides for adjustment of pt's bed pad.    Transfers Overall transfer level: Needs assistance Equipment used: Rolling walker (2 wheels) Transfers: Sit to/from Stand Sit to Stand: Max assist, +2 physical assistance, From elevated surface          Lateral/Scoot Transfers: Max assist, +2 physical assistance General transfer comment: from EOB x5 trials, pt progressively less able to lift hips off bed and maintains trunk flexion with each attempt, not able to fully extend hips. Pt tending to keep BLE widely abducted and not able to weight shift within RW due to wide stance. Plan to bring wider RW and BUE platform supports next session to see if this allows him to stand more upright/more safely.    Ambulation/Gait               General Gait Details: pt unable to weight shift in stance today   Stairs             Wheelchair Mobility     Tilt Bed    Modified Rankin (Stroke Patients Only)  Balance Overall balance assessment: Needs assistance Sitting-balance support: Feet supported, Single extremity supported, No upper extremity supported Sitting balance-Leahy Scale: Fair Sitting balance - Comments: able to sit statically with feet supported at EOB   Standing balance support: Bilateral upper extremity supported, Reliant on assistive device for balance Standing balance-Leahy  Scale: Zero Standing balance comment: x5 trials but not able to stand upright enough to weight shift, very flexed trunk.                            Communication Communication Communication: No apparent difficulties  Cognition Arousal: Alert Behavior During Therapy: WFL for tasks assessed/performed   PT - Cognitive impairments: Sequencing, Problem solving, Initiation, Memory, Safety/Judgement                       PT - Cognition Comments: Pt with decreased insight into deficits and despite report of 10/10 pain, trying to push past pain in unsafe manner. Pt receptive to discussion on activity pacing/energy conservation and slower progression of therapies within his tolerance for his safety given deficits and pain. Following commands: Intact      Cueing Cueing Techniques: Verbal cues, Gestural cues, Visual cues, Tactile cues  Exercises Other Exercises Other Exercises: seated BLE AROM: LAQ x5 reps ea x1 sets Other Exercises: STS x 5 trials from elevated bed<>RW, pt maintains ~3-10 seconds each trial Other Exercises: seated lateral leans x3 reps ea side at EOB Other Exercises: supine PROM heel cord stretch x30 sec x1 rep ea Other Exercises: supine SAQ x3-5 reps ea for teachback    General Comments General comments (skin integrity, edema, etc.): R hip dressing appears c/d/i; pt has condom cath which appears intact throughout session. BP 100/73 taken on RUE at end of session in bed chair posture with HOB ~60 degrees; ice pack over his R hip at end of session      Pertinent Vitals/Pain Pain Assessment Pain Assessment: 0-10 Pain Score: 9  Pain Location: R hip with EOB/standing trials Pain Descriptors / Indicators: Sore, Numbness, Operative site guarding, Grimacing, Guarding Pain Intervention(s): Limited activity within patient's tolerance, Monitored during session, Repositioned    Home Living                          Prior Function            PT  Goals (current goals can now be found in the care plan section) Acute Rehab PT Goals Patient Stated Goal: home PT Goal Formulation: With patient/family Time For Goal Achievement: 12/30/23 Progress towards PT goals: Progressing toward goals    Frequency    Min 2X/week      PT Plan      Co-evaluation              AM-PAC PT "6 Clicks" Mobility   Outcome Measure  Help needed turning from your back to your side while in a flat bed without using bedrails?: A Lot Help needed moving from lying on your back to sitting on the side of a flat bed without using bedrails?: A Lot (in hospital bed) Help needed moving to and from a bed to a chair (including a wheelchair)?: Total Help needed standing up from a chair using your arms (e.g., wheelchair or bedside chair)?: Total Help needed to walk in hospital room?: Total Help needed climbing 3-5 steps with a railing? : Total 6 Click Score: 8  End of Session Equipment Utilized During Treatment: Gait belt;Other (comment) (prevalon boots once in supine) Activity Tolerance: Patient tolerated treatment well;Patient limited by fatigue;Patient limited by pain Patient left: in bed;with call bell/phone within reach;with bed alarm set;Other (comment) (prevalon boots donned (with wedge in place to prevent RLE IR), pt bed in chair posture with pillows for support to prevent pt L leg pushing on bed side rails) Nurse Communication: Mobility status;Need for lift equipment;Patient requests pain meds;Precautions PT Visit Diagnosis: Other abnormalities of gait and mobility (R26.89);Muscle weakness (generalized) (M62.81);Pain Pain - Right/Left: Right Pain - part of body: Hip     Time: 8657-8469 PT Time Calculation (min) (ACUTE ONLY): 39 min  Charges:    $Therapeutic Exercise: 8-22 mins $Therapeutic Activity: 23-37 mins PT General Charges $$ ACUTE PT VISIT: 1 Visit                     Veniamin Kincaid P., PTA Acute Rehabilitation Services Secure Chat  Preferred 9a-5:30pm Office: [phone]    Mariel Shope [facility] 12/19/2023, 4:02 PM

## 2023-12-20 DIAGNOSIS — S72001A Fracture of unspecified part of neck of right femur, initial encounter for closed fracture: Secondary | ICD-10-CM | POA: Diagnosis not present

## 2023-12-20 NOTE — Progress Notes (Signed)
 Orthopedic Surgery Progress Note   Assessment: Patient is a 68 y.o. male with right femoral neck fracture s/p cemented hemiarthroplasty   Plan: -Operative plans: complete -Diet: regular -DVT ppx: aspirin  81mg  BID -Weight bearing status: as tolerated with posterior hip precautions -PT evaluate and treat -Pain control -Dispo: per primary  ___________________________________________________________________________  Subjective: No acute events overnight. Hip pain continues to improve. No complaints this morning.    Physical Exam:  General: no acute distress, appears stated age, laying in bed  Neurologic: alert, answering questions appropriately, following commands Respiratory: unlabored breathing on room air, symmetric chest rise Psychiatric: appropriate affect, normal cadence to speech  MSK:   -Right lower extremity  Dressing over hip c/d/i EHL and TA not intact due to muscular dystrophy (chronic), GSC intact Plantarflexes toes Sensation intact to light touch in sural, saphenous, tibial, deep peroneal, and superficial peroneal nerve distributions Foot warm and well perfused, palpable DP pulse   Patient name: Luis Goudeau Cannan Jr. Patient MRN: [id number] Date: 12/20/23

## 2023-12-20 NOTE — Progress Notes (Signed)
  Progress Note   Patient: Luis [name]. WUJ:[id number] DOB: 09/06/1955 DOA: 12/13/2023     7 DOS: the patient was seen and examined on 12/20/2023   Brief hospital course: As per prior documentations: "Patient is a 68 year old male with GERD, HLD, FSH muscular dystrophy, Parkinson's disease, spinal stenosis, chronic pain, OSA presented after falls at home and hip pain.  Patient reported first fall the night before the admission while walking to the bathroom with his walker, had significant pain in the right hip.  On the day of admission he fell again when he was unable to bear weight.  Family reported he did hit his head during the fall.  CT right hip showed slightly comminuted displaced fracture of the right femoral neck"  12/20/2023: Patient seen alongside patient's wife.  No complaints.  Awaiting disposition.  Possible discharge on Monday, 12/22/2023.    Assessment and Plan:  Closed displaced fracture of the right femoral neck - Status post hemiarthroplasty 4/13.  Orthopedic following closely.  Aspirin  81 mg twice daily.  PT/OT recommending SNF.  Continue home oral Dilaudid . 12/20/2023: Pursue disposition.  FSH (facioscapulohumeral muscular dystrophy)/spinal stenosis/Chronic severe neuropathic pain/chronic low back pain - Followed closely by neurology in the outpatient setting.  Home Dilaudid , gabapentin .  PT/OT   Parkinson's disease - Continue Sinemet .    Constipation - Senokot, MiraLAX , mag citrate x 1.  Appears to be resolved.  Goals of care - Working closely with TOC, orthopedic surgery, patient and wife on disposition planning.  Initial consult to CIR as patient had been seen there before.  However given patient's decline and underlying comorbidities (FSH/Parkinson's), CAR inpatient rehab not feasible (see consult note).  At this time we will work with case management to pursue SNF at [facility].     Subjective: No new complaints.  Physical Exam: Vitals:   12/20/23 0540 12/20/23  0544 12/20/23 0830 12/20/23 1556  BP: (!) 87/63 102/65 109/76 121/77  Pulse: (!) 51 (!) 56 64 62  Resp: 16  16   Temp: 98.3 F (36.8 C)  98.2 F (36.8 C)   TempSrc:   Oral   SpO2: 97%  97%   Weight:      Height:       GENERAL: Awake and alert.  Not in any distress.    HEENT: Patient is pale. CARDIOVASCULAR: S1-S2.   RESPIRATORY:  Clear to auscultation. GASTROINTESTINAL:  Soft, nontender EXTREMITIES: Mild edema.   NEURO: Awake and alert.  Data Reviewed:  There are no new results to review at this time.  Family Communication: Wife at bedside  Disposition: Status is: Inpatient Remains inpatient appropriate because: Right hip fracture  Planned Discharge Destination: Skilled nursing facility    Time spent: 35 minutes  Author: Doroteo Gasmen, MD 12/20/2023 5:06 PM  For on call review [email or web address].

## 2023-12-20 NOTE — Plan of Care (Signed)

## 2023-12-20 NOTE — Plan of Care (Signed)
  Problem: Education: Goal: Knowledge of General Education information will improve Description: Including pain rating scale, medication(s)/side effects and non-pharmacologic comfort measures 12/20/2023 0604 by Syble Europe, LPN Outcome: Progressing 12/20/2023 0604 by Syble Europe, LPN Outcome: Progressing   Problem: Health Behavior/Discharge Planning: Goal: Ability to manage health-related needs will improve 12/20/2023 0604 by Syble Europe, LPN Outcome: Progressing 12/20/2023 0604 by Syble Europe, LPN Outcome: Progressing   Problem: Clinical Measurements: Goal: Ability to maintain clinical measurements within normal limits will improve 12/20/2023 0604 by Syble Europe, LPN Outcome: Progressing 12/20/2023 0604 by Syble Europe, LPN Outcome: Progressing Goal: Will remain free from infection 12/20/2023 0604 by Syble Europe, LPN Outcome: Progressing 12/20/2023 0604 by Syble Europe, LPN Outcome: Progressing Goal: Diagnostic test results will improve 12/20/2023 0604 by Syble Europe, LPN Outcome: Progressing 12/20/2023 0604 by Syble Europe, LPN Outcome: Progressing Goal: Respiratory complications will improve 12/20/2023 0604 by Syble Europe, LPN Outcome: Progressing 12/20/2023 0604 by Syble Europe, LPN Outcome: Progressing Goal: Cardiovascular complication will be avoided 12/20/2023 0604 by Syble Europe, LPN Outcome: Progressing 12/20/2023 0604 by Syble Europe, LPN Outcome: Progressing   Problem: Activity: Goal: Risk for activity intolerance will decrease 12/20/2023 0604 by Syble Europe, LPN Outcome: Progressing 12/20/2023 0604 by Syble Europe, LPN Outcome: Progressing   Problem: Nutrition: Goal: Adequate nutrition will be maintained 12/20/2023 0604 by Syble Europe, LPN Outcome: Progressing 12/20/2023 0604 by Syble Europe, LPN Outcome: Progressing   Problem: Coping: Goal: Level of anxiety will decrease 12/20/2023 0604 by Syble Europe, LPN Outcome: Progressing 12/20/2023 0604 by Syble Europe,  LPN Outcome: Progressing   Problem: Elimination: Goal: Will not experience complications related to bowel motility Outcome: Progressing Goal: Will not experience complications related to urinary retention Outcome: Progressing   Problem: Pain Managment: Goal: General experience of comfort will improve and/or be controlled Outcome: Progressing   Problem: Safety: Goal: Ability to remain free from injury will improve Outcome: Progressing   Problem: Skin Integrity: Goal: Risk for impaired skin integrity will decrease Outcome: Progressing

## 2023-12-21 ENCOUNTER — Encounter (HOSPITAL_COMMUNITY): Payer: Self-pay | Admitting: Internal Medicine

## 2023-12-21 DIAGNOSIS — S72001A Fracture of unspecified part of neck of right femur, initial encounter for closed fracture: Secondary | ICD-10-CM | POA: Diagnosis not present

## 2023-12-21 NOTE — Progress Notes (Signed)
  Progress Note   Patient: Luis [name]. ZOX:[id number] DOB: Nov 29, 1955 DOA: 12/13/2023     8 DOS: the patient was seen and examined on 12/21/2023   Brief hospital course: As per prior documentations: "Patient is a 68 year old male with GERD, HLD, FSH muscular dystrophy, Parkinson's disease, spinal stenosis, chronic pain, OSA presented after falls at home and hip pain.  Patient reported first fall the night before the admission while walking to the bathroom with his walker, had significant pain in the right hip.  On the day of admission he fell again when he was unable to bear weight.  Family reported he did hit his head during the fall.  CT right hip showed slightly comminuted displaced fracture of the right femoral neck"  12/20/2023: Patient seen alongside patient's wife.  No complaints.  Awaiting disposition.  Possible discharge on Monday, 12/22/2023.    12/21/2023: Patient seen alongside patient's wife.  No new changes.  Awaiting disposition.  Assessment and Plan:  Closed displaced fracture of the right femoral neck - Status post hemiarthroplasty 4/13.  Orthopedic following closely.  Aspirin  81 mg twice daily.  PT/OT recommending SNF.  Continue home oral Dilaudid . 12/21/2023: Pursue disposition.  FSH (facioscapulohumeral muscular dystrophy)/spinal stenosis/Chronic severe neuropathic pain/chronic low back pain - Followed closely by neurology in the outpatient setting.  Home Dilaudid , gabapentin .  PT/OT   Parkinson's disease - Continue Sinemet .    Constipation - Senokot, MiraLAX , mag citrate x 1.  Appears to be resolved.  Goals of care - Working closely with TOC, orthopedic surgery, patient and wife on disposition planning.  Initial consult to CIR as patient had been seen there before.  However given patient's decline and underlying comorbidities (FSH/Parkinson's), CAR inpatient rehab not feasible (see consult note).  At this time we will work with case management to pursue SNF at  [facility].     Subjective: No new complaints.  Physical Exam: Vitals:   12/20/23 1556 12/20/23 1951 12/21/23 0353 12/21/23 0804  BP: 121/77 97/63 103/76 (!) 117/96  Pulse: 62 63 (!) 57 63  Resp:  17 18 18   Temp:  98.2 F (36.8 C) 98 F (36.7 C) (!) 97.3 F (36.3 C)  TempSrc:  Oral  Oral  SpO2:  98% 100% 99%  Weight:      Height:       GENERAL: Awake and alert.  Not in any distress.    HEENT: Patient is pale. CARDIOVASCULAR: S1-S2.   RESPIRATORY:  Clear to auscultation. GASTROINTESTINAL:  Soft, nontender EXTREMITIES: Mild edema.   NEURO: Awake and alert.  Data Reviewed:  There are no new results to review at this time.  Family Communication: Wife at bedside  Disposition: Status is: Inpatient Remains inpatient appropriate because: Right hip fracture  Planned Discharge Destination: Skilled nursing facility    Time spent: 35 minutes  Author: Doroteo Gasmen, MD 12/21/2023 11:04 AM  For on call review [email or web address].

## 2023-12-21 NOTE — Plan of Care (Signed)
  Problem: Health Behavior/Discharge Planning: Goal: Ability to manage health-related needs will improve Outcome: Progressing   Problem: Education: Goal: Knowledge of General Education information will improve Description Including pain rating scale, medication(s)/side effects and non-pharmacologic comfort measures Outcome: Progressing   Problem: Clinical Measurements: Goal: Ability to maintain clinical measurements within normal limits will improve Outcome: Progressing   Problem: Clinical Measurements: Goal: Will remain free from infection Outcome: Progressing   Problem: Clinical Measurements: Goal: Diagnostic test results will improve Outcome: Progressing   Problem: Clinical Measurements: Goal: Respiratory complications will improve Outcome: Progressing   Problem: Clinical Measurements: Goal: Cardiovascular complication will be avoided Outcome: Progressing

## 2023-12-21 NOTE — Progress Notes (Signed)
 Psychosocial Progressive/Outcome: ANOx4, calm and cooperative  Family at bedside   Pain/Comfort Progression/Outcome: Pain managed with scheduled pain medication   Clinical Progression/Outcome: Adequate fluid and diet intake  Encouraged pt to turn and reposition during shift  Voids to condom cath  Had BM Slept in between care Maintained safety   D/C plan to: SNF

## 2023-12-21 NOTE — Progress Notes (Signed)
 Orthopedic Surgery Progress Note   Assessment: Patient is a 68 y.o. male with right femoral neck fracture s/p cemented hemiarthroplasty   Plan: -Operative plans: complete -Diet: regular -DVT ppx: aspirin  81mg  BID -Weight bearing status: as tolerated with posterior hip precautions -PT evaluate and treat -Pain control -Dispo: per primary  ___________________________________________________________________________  Subjective: No acute events overnight. Pain well controlled with his home dilaudid  dosage. No complaints this morning.    Physical Exam:  General: no acute distress, appears stated age, laying in bed  Neurologic: alert, answering questions appropriately, following commands Respiratory: unlabored breathing on room air, symmetric chest rise Psychiatric: appropriate affect, normal cadence to speech  MSK:   -Right lower extremity  Dressing over hip c/d/i EHL and TA not intact due to muscular dystrophy (chronic), GSC intact Plantarflexes toes Sensation intact to light touch in sural, saphenous, tibial, deep peroneal, and superficial peroneal nerve distributions Foot warm and well perfused, palpable DP pulse   Patient name: Luis Slaven Heizer Jr. Patient MRN: [id number] Date: 12/21/23

## 2023-12-22 ENCOUNTER — Other Ambulatory Visit (HOSPITAL_COMMUNITY): Payer: Self-pay

## 2023-12-22 DIAGNOSIS — S72001A Fracture of unspecified part of neck of right femur, initial encounter for closed fracture: Secondary | ICD-10-CM | POA: Diagnosis not present

## 2023-12-22 MED ORDER — ASPIRIN 81 MG PO CHEW
81.0000 mg | CHEWABLE_TABLET | Freq: Two times a day (BID) | ORAL | 0 refills | Status: DC
  Filled 2023-12-22: qty 30, 15d supply, fill #0

## 2023-12-22 MED ORDER — ZOLPIDEM TARTRATE 10 MG PO TABS: 10.0000 mg | ORAL_TABLET | Freq: Every day | ORAL | 0 refills | Status: AC

## 2023-12-22 MED ORDER — DIAZEPAM 5 MG PO TABS: 10.0000 mg | ORAL_TABLET | Freq: Two times a day (BID) | ORAL | 0 refills | Status: AC | PRN

## 2023-12-22 MED ORDER — HYDROMORPHONE HCL 2 MG PO TABS: 10.0000 mg | ORAL_TABLET | Freq: Four times a day (QID) | ORAL | 0 refills | Status: DC

## 2023-12-22 MED ORDER — BISACODYL 5 MG PO TBEC
5.0000 mg | DELAYED_RELEASE_TABLET | Freq: Every day | ORAL | 0 refills | Status: DC | PRN
  Filled 2023-12-22: qty 30, 30d supply, fill #0

## 2023-12-22 MED ORDER — FAMOTIDINE 20 MG PO TABS
20.0000 mg | ORAL_TABLET | Freq: Every day | ORAL | 0 refills | Status: DC
  Filled 2023-12-22: qty 30, 30d supply, fill #0

## 2023-12-22 MED ORDER — SENNOSIDES-DOCUSATE SODIUM 8.6-50 MG PO TABS
1.0000 | ORAL_TABLET | Freq: Two times a day (BID) | ORAL | 0 refills | Status: DC
  Filled 2023-12-22: qty 14, 7d supply, fill #0

## 2023-12-22 NOTE — TOC Transition Note (Signed)
 Transition of Care [facility]) - Discharge Note   Patient Details  Name: Luis Kohles Dirden Jr. MRN: [id number] Date of Birth: October 08, 1955  Transition of [facility]) CM/SW Contact:  Yochanan Eddleman A [place], LCSW Phone Number: 12/22/2023, 3:55 PM   Clinical Narrative:     Patient will DC to: [facility]  Anticipated DC date: 12/22/23  Family notified: Luis [name]  Transport by: Luis [name]      Per MD patient ready for DC to [facility]. RN, patient, patient's family, and facility notified of DC. Discharge Summary and FL2 sent to facility. RN to call report prior to discharge [phone]). DC packet on chart. Ambulance transport requested for patient.     CSW will sign off for now as social work intervention is no longer needed. Please consult us  again if new needs arise.   Final next level of care: Skilled Nursing Facility Barriers to Discharge: Barriers Resolved   Patient Goals and CMS Choice            Discharge Placement              Patient chooses bed at: [facility] [place] Patient to be transferred to facility by: [facility] Name of family member notified: Luis [name] Patient and family notified of of transfer: 12/22/23  Discharge Plan and Services Additional resources added to the After Visit Summary for                                       Social Drivers of Health (SDOH) Interventions SDOH Screenings   Food Insecurity: No Food Insecurity (12/13/2023)  Housing: Low Risk  (12/13/2023)  Transportation Needs: No Transportation Needs (12/13/2023)  Utilities: Not At Risk (12/13/2023)  Depression (PHQ2-9): Low Risk  (07/14/2023)  Social Connections: Socially Integrated (12/13/2023)  Tobacco Use: Low Risk  (12/21/2023)     Readmission Risk Interventions     No data to display

## 2023-12-22 NOTE — Plan of Care (Signed)

## 2023-12-22 NOTE — Discharge Summary (Signed)
 Physician Discharge Summary  Patient ID: Luis Sciascia Donati Jr. MRN: [id number] DOB/AGE: [birth year] 68 y.o.  Admit date: 12/13/2023 Discharge date: 12/22/2023  Admission Diagnoses:  Discharge Diagnoses:  Principal Problem:   Closed displaced fracture of right femoral neck (HCC) Active Problems:   FSH (facioscapulohumeral muscular dystrophy) (HCC)   Parkinson's disease (HCC)   Other chronic pain   Spinal stenosis of lumbar region with neurogenic claudication   Gastro-esophageal reflux disease without esophagitis   Hyperlipidemia   Discharged Condition: stable  Hospital Course:  Patient is a 68 year old male with past medical history significant for GERD, HLD, FSH muscular dystrophy, Parkinson's disease, spinal stenosis, chronic pain, and OSA patient was admitted after falls at home with hip pain and closed displaced fracture of the right femoral neck.  CT right hip showed slightly comminuted displaced fracture of the right femoral neck.  Patient underwent cemented hemiarthroplasty of right femoral neck fracture.  Orthopedic surgery team assisted with patient's management.  Patient will be discharged to subacute rehab facility.  Patient has been optimized.  Orthopedic surgery team is cleared patient for surgery.  Closed displaced fracture of the right femoral neck: - Status post hemiarthroplasty 12/14/2023.   - Orthopedic surgery directed postop care. - Aspirin  81 mg twice daily.   - Patient has been cleared for discharge by the orthopedic surgery team. - Patient will be discharged to skilled nursing facility for subacute rehab.     FSH (facioscapulohumeral muscular dystrophy)/spinal stenosis/Chronic severe neuropathic: Pain/chronic low back pain - Followed closely by neurology in the outpatient setting.   - Continue Dilaudid  as needed, and gabapentin .   -PT/OT    Parkinson's disease: - Continue Sinemet .     Constipation: - Senokot, MiraLAX      Consults: Orthopedic surgery and  physical medicine and rehabilitation.  Significant Diagnostic Studies:  CT OF THE RIGHT HIP WITHOUT CONTRAST   TECHNIQUE: Multidetector CT imaging of the right hip was performed according to the standard protocol. Multiplanar CT image reconstructions were also generated.   RADIATION DOSE REDUCTION: This exam was performed according to the departmental dose-optimization program which includes automated exposure control, adjustment of the mA and/or kV according to patient size and/or use of iterative reconstruction technique.   COMPARISON:  12/21/2023, 05/27/2022.   FINDINGS: Bones/Joint/Cartilage   There is a mildly-comminuted fracture femoral neck with overlapping and mild lateral displacement of the distal fracture fragment. No dislocation is seen. A stable region of sclerosis is noted in the proximal sacrum, unchanged from 2023. Mild degenerative changes are present at the right hip.   Ligaments   Suboptimally assessed by CT.   Muscles and Tendons   No intramuscular hematoma is seen. Marked muscular atrophy is noted in the proximal right lower extremity.   Soft tissues   No acute abnormality.   IMPRESSION: Slightly comminuted displaced fracture of the right femoral neck.     Electronically Signed   By: Wyvonnia Heimlich M.D.   On: 12/13/2023 15:32     CT HEAD WITHOUT CONTRAST   TECHNIQUE: Contiguous axial images were obtained from the base of the skull through the vertex without intravenous contrast.   RADIATION DOSE REDUCTION: This exam was performed according to the departmental dose-optimization program which includes automated exposure control, adjustment of the mA and/or kV according to patient size and/or use of iterative reconstruction technique.   COMPARISON:  Head CT 12/23/2017   FINDINGS: Brain: There is no evidence of an acute infarct, intracranial hemorrhage, mass, midline shift, or extra-axial fluid collection. Cerebral  volume is within  normal limits for age with normal size of the ventricles.   Vascular: No hyperdense vessel or unexpected calcification.   Skull: No acute fracture or suspicious lesion.   Sinuses/Orbits: Visualized paranasal sinuses and mastoid air cells are clear. Bilateral cataract extraction.   Other: None.   IMPRESSION: Negative head CT for age.     Electronically Signed   By: Aundra Lee M.D.   On: 12/13/2023 15:54    CHEST  1 VIEW:   COMPARISON:  03/04/2019   FINDINGS: Stable asymmetric elevation left hemidiaphragm. Cardiopericardial silhouette is at upper limits of normal for size. The lungs are clear without focal pneumonia, edema, pneumothorax or pleural effusion. No acute bony abnormality.   IMPRESSION: Stable.  No acute findings.     Electronically Signed   By: Donnal Fusi M.D.   On: 12/13/2023 14:04     PELVIS - 1-2 VIEW   COMPARISON:  Right hip x-ray 12/13/2023.   FINDINGS: There is a new right hip arthroplasty in anatomic alignment. There is lateral hip soft tissue swelling, air and skin staples compatible with recent surgery.   IMPRESSION: New right hip arthroplasty in anatomic alignment.     Electronically Signed   By: Tyron Gallon M.D.   On: 12/14/2023 18:08   Treatments: S/p cemented hemiarthroplasty for right femoral neck fracture  Discharge Exam: Blood pressure 100/75, pulse (!) 58, temperature 98 F (36.7 C), temperature source Oral, resp. rate 18, height 5' 10.98" (1.803 m), weight 80 kg, SpO2 100%.   Disposition: Discharge disposition: 03-Skilled Nursing Facility       Discharge Instructions     Diet - low sodium heart healthy   Complete by: As directed    Increase activity slowly   Complete by: As directed       Allergies as of 12/22/2023       Reactions   Celebrex [celecoxib] Other (See Comments)   Burning sensation   Cymbalta  [duloxetine  Hcl] Other (See Comments)   No relief        Medication List     STOP  taking these medications    diclofenac  Sodium 1 % Gel Commonly known as: VOLTAREN    ibuprofen  800 MG tablet Commonly known as: ADVIL    silver sulfADIAZINE 1 % cream Commonly known as: SILVADENE       TAKE these medications    aspirin  81 MG chewable tablet Chew 1 tablet (81 mg total) by mouth 2 (two) times daily.   bisacodyl  5 MG EC tablet Commonly known as: DULCOLAX Take 1 tablet (5 mg total) by mouth daily as needed for moderate constipation.   carbidopa -levodopa  50-200 MG tablet Commonly known as: SINEMET  CR Take 1 tablet by mouth in the morning, at noon, and at bedtime.   diazepam  5 MG tablet Commonly known as: VALIUM  Take 2 tablets (10 mg total) by mouth 2 (two) times daily as needed for anxiety. What changed: reasons to take this   doxycycline  100 MG tablet Commonly known as: ADOXA Take 100 mg by mouth 2 (two) times daily with a meal.   famotidine  20 MG tablet Commonly known as: PEPCID  Take 1 tablet (20 mg total) by mouth daily. Start taking on: December 23, 2023   gabapentin  600 MG tablet Commonly known as: Neurontin  Take 1 tablet (600 mg total) by mouth 4 (four) times daily. For nerve pain What changed: how much to take   HYDROmorphone  2 MG tablet Commonly known as: DILAUDID  Take 5 tablets (10 mg  total) by mouth every 6 (six) hours. What changed:  medication strength how much to take when to take this reasons to take this Another medication with the same name was removed. Continue taking this medication, and follow the directions you see here.   nystatin  100000 UNIT/ML suspension Commonly known as: MYCOSTATIN  Take 5 mLs by mouth 3 (three) times daily.   polyethylene glycol 17 g packet Commonly known as: MIRALAX  / GLYCOLAX  Take 17 g by mouth at bedtime.   prednisoLONE  15 MG/5ML Soln Commonly known as: PRELONE  Take 22.5 mg by mouth daily.   prednisoLONE  acetate 1 % ophthalmic suspension Commonly known as: PRED FORTE  Place 1 drop into both eyes  daily.   senna-docusate 8.6-50 MG tablet Commonly known as: Senokot-S Take 1 tablet by mouth 2 (two) times daily.   timolol  0.5 % ophthalmic solution Commonly known as: BETIMOL  Place 1 drop into both eyes 2 (two) times daily.   zolpidem  10 MG tablet Commonly known as: AMBIEN  Take 10 mg by mouth at bedtime.        Contact information for after-discharge care     Destination     [facility] [place] [facility] .   Service: Skilled Nursing Contact information: [address] [place]  [address] [phone]                     Time spent: 35 minutes.  SignedDoroteo Gasmen 12/22/2023, 11:09 AM

## 2023-12-22 NOTE — Progress Notes (Signed)
 Orthopedic Surgery Progress Note   Assessment: Patient is a 68 y.o. male with right femoral neck fracture s/p cemented hemiarthroplasty   Plan: -Operative plans: complete -Diet: regular -DVT ppx: aspirin  81mg  BID -Weight bearing status: as tolerated with posterior hip precautions -PT evaluate and treat -Pain control -Dispo: per primary  ___________________________________________________________________________  Subjective: No acute events overnight. Planning for discharge to [facility] today.    Physical Exam:  General: no acute distress, appears stated age, laying in bed  Neurologic: alert, answering questions appropriately, following commands Respiratory: unlabored breathing on room air, symmetric chest rise Psychiatric: appropriate affect, normal cadence to speech  MSK:   -Right lower extremity  Dressing over hip c/d/i EHL and TA not intact due to muscular dystrophy (chronic), GSC intact Plantarflexes toes Sensation intact to light touch in sural, saphenous, tibial, deep peroneal, and superficial peroneal nerve distributions Foot warm and well perfused, palpable DP pulse   Patient name: Luis Mcbrien Auld Jr. Patient MRN: [id number] Date: 12/22/23

## 2023-12-26 ENCOUNTER — Ambulatory Visit: Admitting: Orthopedic Surgery

## 2023-12-26 ENCOUNTER — Other Ambulatory Visit (INDEPENDENT_AMBULATORY_CARE_PROVIDER_SITE_OTHER): Payer: Self-pay

## 2023-12-26 DIAGNOSIS — M25551 Pain in right hip: Secondary | ICD-10-CM | POA: Diagnosis not present

## 2023-12-26 DIAGNOSIS — M7989 Other specified soft tissue disorders: Secondary | ICD-10-CM

## 2023-12-26 DIAGNOSIS — M25561 Pain in right knee: Secondary | ICD-10-CM

## 2023-12-26 NOTE — Progress Notes (Addendum)
 Orthopedic Surgery Post-operative Office Visit  Procedure: Right hip cemented hemiarthroplasty Date of Surgery: 12/14/2023 (~2 weeks post-op)  Assessment: Patient is a 68 y.o. whose hip pain has improved significantly since discharge from the hospital.  Still having difficulty with standing and walking at the level he was prior to his fall   Plan: -Operative plans complete -Weightbearing as tolerated right lower extremity with posterior precautions -Staples removed today in office -Okay to let soap/water  run over the incision but do not submerge -Continue to work with PT at the SNF -Ordered a Doppler to evaluate for DVT - will call with results -DVT ppx: Aspirin  81 mg twice daily -Performed a right knee cortisone injection (see note below) -Return to office in 4 weeks, x-rays needed at next visit: none   Right knee injection note: After discussing the risks, benefits, and alternatives of right knee injection, patient elected to proceed. The patient had the knee at 90 degrees. The anterolateral soft spot was prepped with an alcohol based prep. Ethyl chloride was used to anesthetize the skin. A 20 gauge needle was used to inject 1cc of bupivacaine , 1cc of lidocaine , 1cc of depomedrol under standard sterile technique into the intraarticular space. Needle was withdrawn and band aid applied. Patient tolerated the procedure well.  ___________________________________________________________________________   Subjective: Patient was discharged to a skilled nursing facility.  He feels the staff has been working with him well.  They are doing physical therapy on a daily basis.  He has been standing more than he was in the hospital.  He has not taken any steps with physical therapy yet.  His hip pain has improved significantly since discharge from the hospital.  He has not noticed any redness or drainage around his incision.  He has wife had noted swelling around the calf and foot/ankle.  He does not  have any pain in the calf/foot.  He has not noticed any swelling on the left side.  He is also complaining of right knee pain. He had previously been seen at [facility] for this issue. They had done aspiration and cortisone injections for him. He is interested in another here because it will be difficult with transportation to get back to [facility].    Objective:  General: no acute distress, appropriate affect Neurologic: alert, answering questions appropriately, following commands Respiratory: unlabored breathing on room air Skin: incision is well-approximated with no erythema, induration, active/possible drainage  MSK (RLE): No pain with logroll, legs symmetric in length, no gross deformity, EHL or TA not intact chronically due to muscular dystrophy, GSC and soleus intact, plantar flexes toes but does not dorsiflex, sensation intact to light touch in sural/saphenous/deep peroneal/superficial peroneal/tibial nerve distributions, foot warm and well-perfused, palpable DP pulse.  Swelling is noted over the calf and foot.  It is asymmetric when looking at the other side.  Negative Homans.  Imaging: X-rays of the right hip from 12/26/2023 were independently reviewed and interpreted, showing cemented hemiarthroplasty in satisfactory position.  There is asymmetric cement mantle around the stem of the hemiarthroplasty.  No dislocation seen.  No new fracture seen.   Patient name: Luis E Brougher Jr. Patient MRN: [id number] Date of visit: 12/26/23

## 2024-01-19 ENCOUNTER — Encounter: Payer: Medicare Other | Admitting: Physical Medicine and Rehabilitation

## 2024-01-28 ENCOUNTER — Ambulatory Visit (INDEPENDENT_AMBULATORY_CARE_PROVIDER_SITE_OTHER): Admitting: Orthopedic Surgery

## 2024-01-28 DIAGNOSIS — Z96649 Presence of unspecified artificial hip joint: Secondary | ICD-10-CM

## 2024-01-28 NOTE — Progress Notes (Signed)
 Orthopedic Surgery Post-operative Office Visit   Procedure: Right hip cemented hemiarthroplasty Date of Surgery: 12/14/2023 (~6 weeks post-op)   Assessment: Patient is a 68 y.o. whose hip pain has improved significantly since discharge from the hospital.  Still having difficulty with standing and walking at the level he was prior to his fall     Plan: -Operative plans complete -Weightbearing as tolerated right lower extremity with posterior precautions -Okay to submerge wound at this time -Continue to work with PT at the SNF -DVT ppx: can discontinue ASA -Return to office in 6 weeks, x-rays needed at next visit: low-set AP pelvis   ___________________________________________________________________________     Subjective: Patient remains at a skilled nursing facility.  He has noticed worsening of his muscle weakness from his fascioscapulohumeral dystrophy and has had difficulty with ambulating.  He has been doing sit to stand with physical therapy and has been standing with them but has not done much more.  He and his wife are figuring out longer-term solutions for him since she cannot take care of him at home.  He is not having any hip pain.  He has not noticed any redness or drainage around his incisions. Doppler was negative after our last visit and swelling has resolved.   Objective:   General: no acute distress, appropriate affect Neurologic: alert, answering questions appropriately, following commands Respiratory: unlabored breathing on room air Skin: incision is well healed with no erythema, induration, active/possible drainage   MSK (RLE): No pain with logroll, legs symmetric in length, no gross deformity, EHL or TA not intact chronically due to muscular dystrophy, GSC and soleus intact, plantar flexes toes but does not dorsiflex, sensation intact to light touch in sural/saphenous/deep peroneal/superficial peroneal/tibial nerve distributions, foot warm and well-perfused, palpable  DP pulse   Imaging: X-rays of the right hip from 12/26/2023 were previously independently reviewed and interpreted, showing cemented hemiarthroplasty in satisfactory position.  There is asymmetric cement mantle around the stem of the hemiarthroplasty.  No dislocation seen.  No new fracture seen.     Patient name: Luis E Lacko Jr. Patient MRN: [id number] Date of visit: 01/28/24

## 2024-03-10 ENCOUNTER — Telehealth: Payer: Self-pay | Admitting: Physical Medicine and Rehabilitation

## 2024-03-10 NOTE — Telephone Encounter (Signed)
-----   Message from [facility] sent at 03/09/2024  2:44 PM EDT ----- Hey Dr Cornelio ,   Particia called in to let us  know patient has passed away 26-Apr-2024 , she wasn't sure if you were aware but wanted to thank you for everything .

## 2024-03-10 NOTE — Telephone Encounter (Signed)
 Called to express my regrets that Luis [name], but it sounds like he's at peace now, which is what he wanted.

## 2024-03-16 ENCOUNTER — Telehealth: Payer: Self-pay | Admitting: Orthopedic Surgery

## 2024-03-16 NOTE — Telephone Encounter (Signed)
 Pt's wife called stating pt passed away 03/29/24. She wanted to say thank you for taking such great care of him. Expressed our condolences to her and the family and informed her that I will pass this on to Dr Georgina.

## 2024-03-17 ENCOUNTER — Ambulatory Visit: Admitting: Orthopedic Surgery

## 2024-03-26 ENCOUNTER — Telehealth: Payer: Self-pay | Admitting: Neurology

## 2024-03-26 NOTE — Telephone Encounter (Signed)
 Pt's wife called wanting to inform the office that unfortunately the pt passed away 04/03/2024. She would like to thank the staff and especially the provider from the bottom of her and Luis [name] heart for the wonderful care that was provided to him. She stated that she can not express how happy she was for everything the provider did for the pt.

## 2024-03-26 NOTE — Telephone Encounter (Signed)
 Failed to reach his wife,

## 2024-03-29 NOTE — Telephone Encounter (Signed)
 Sympathy cad mailed

## 2024-04-02 DEATH — deceased

## 2024-05-24 ENCOUNTER — Ambulatory Visit: Admitting: Neurology
